# Patient Record
Sex: Male | Born: 1980 | Race: Black or African American | Hispanic: No | Marital: Single | Smoking: Never smoker
Health system: Southern US, Community
[De-identification: ages and names within clinical notes are randomized; demographics above are authoritative.]

## PROBLEM LIST (undated history)

## (undated) DIAGNOSIS — C6290 Malignant neoplasm of unspecified testis, unspecified whether descended or undescended: Secondary | ICD-10-CM

## (undated) HISTORY — PX: ORCHIECTOMY: SHX2116

## (undated) HISTORY — PX: TUMOR REMOVAL: SHX12

---

## 2019-04-04 ENCOUNTER — Encounter (HOSPITAL_COMMUNITY): Payer: Self-pay

## 2019-04-04 ENCOUNTER — Other Ambulatory Visit: Payer: Self-pay

## 2019-04-04 ENCOUNTER — Ambulatory Visit (HOSPITAL_COMMUNITY)
Admission: EM | Admit: 2019-04-04 | Discharge: 2019-04-04 | Disposition: A | Discharge status: Home / Self Care | Payer: PRIVATE HEALTH INSURANCE | Attending: Internal Medicine | Admitting: Internal Medicine

## 2019-04-04 DIAGNOSIS — S335XXA Sprain of ligaments of lumbar spine, initial encounter: Secondary | ICD-10-CM

## 2019-04-04 MED ORDER — KETOROLAC TROMETHAMINE 30 MG/ML IJ SOLN: 30.0000 mg | Freq: Once | INTRAMUSCULAR | Status: AC

## 2019-04-04 MED ORDER — KETOROLAC TROMETHAMINE 30 MG/ML IJ SOLN
INTRAMUSCULAR | Status: AC
  Filled 2019-04-04: qty 1

## 2019-04-04 MED ORDER — IBUPROFEN 600 MG PO TABS: 600.0000 mg | ORAL_TABLET | Freq: Four times a day (QID) | ORAL | 0 refills | Status: DC | PRN

## 2019-04-04 MED ORDER — CYCLOBENZAPRINE HCL 10 MG PO TABS: 10.0000 mg | ORAL_TABLET | Freq: Three times a day (TID) | ORAL | 0 refills | Status: AC | PRN

## 2019-04-04 MED ORDER — KETOROLAC TROMETHAMINE 30 MG/ML IJ SOLN
30.0000 mg | Freq: Once | INTRAMUSCULAR | Status: DC
  Administered 2019-04-04: 30 mg via INTRAVENOUS

## 2019-04-04 NOTE — ED Provider Notes (Signed)
[place]    CSN: [id number] Arrival date & time: 04/04/19  1045      History   Chief Complaint Chief Complaint  Patient presents with  . Back Pain    HPI Timothy [name] is a 39 y.o. male with no past medical history comes to urgent care with complaints of severe back pain of a few days duration.  Patient has been sedentary of late and works about 11 to 12 hours a day.  Patient remembers carrying a heavy bag prior to traveling down here from [place].  He drove several hours to get here.  Patient describes back pain as sharp, severe, aggravated by movement and has had partial relief with taking over-the-counter pain medications.  Patient denies any radiation of pain into his legs.  He denies any association with weakness in the legs, numbness or tingling.  Patient denies any fall or trauma to the back.   HPI  History reviewed. No pertinent past medical history.  There are no problems to display for this patient.   Past Surgical History:  Procedure Laterality Date  . TUMOR REMOVAL         Home Medications    Prior to Admission medications   Medication Sig Start Date End Date Taking? Authorizing Provider  cyclobenzaprine (FLEXERIL) 10 MG tablet Take 1 tablet (10 mg total) by mouth 3 (three) times daily as needed for muscle spasms. 04/04/19   Brennin Durfee, Myrene Galas, MD  ibuprofen (ADVIL) 600 MG tablet Take 1 tablet (600 mg total) by mouth every 6 (six) hours as needed. 04/04/19   Lexxi Koslow, Myrene Galas, MD    Family History Family History  Family history unknown: Yes    Social History Social History   Tobacco Use  . Smoking status: Never Smoker  Substance Use Topics  . Alcohol use: Not on file  . Drug use: Not on file     Allergies   Patient has no known allergies.   Review of Systems Review of Systems  Constitutional: Negative for activity change, chills, fatigue and fever.  Respiratory: Negative for cough, chest tightness, shortness of breath and  wheezing.   Cardiovascular: Negative for chest pain and palpitations.  Gastrointestinal: Negative for diarrhea, nausea and vomiting.  Musculoskeletal: Positive for back pain. Negative for myalgias and neck pain.  Skin: Negative.   Neurological: Negative for dizziness, weakness, numbness and headaches.  Psychiatric/Behavioral: Negative for agitation and confusion.     Physical Exam Triage Vital Signs ED Triage Vitals [04/04/19 1119]  Enc Vitals Group     BP 136/89     Pulse Rate 98     Resp 20     Temp 98 F (36.7 C)     Temp Source Oral     SpO2 96 %     Weight      Height      Head Circumference      Peak Flow      Pain Score 10     Pain Loc      Pain Edu?      Excl. in [place]?    No data found.  Updated Vital Signs BP 136/89 (BP Location: Left Arm)   Pulse 98   Temp 98 F (36.7 C) (Oral)   Resp 20   SpO2 96%   Visual Acuity Right Eye Distance:   Left Eye Distance:   Bilateral Distance:    Right Eye Near:   Left Eye Near:    Bilateral Near:  Physical Exam Constitutional:      General: He is in acute distress.     Appearance: He is not ill-appearing.  Cardiovascular:     Rate and Rhythm: Normal rate and regular rhythm.     Pulses: Normal pulses.     Heart sounds: Normal heart sounds.  Pulmonary:     Effort: Pulmonary effort is normal.     Breath sounds: Normal breath sounds.  Abdominal:     General: Bowel sounds are normal. There is no distension.     Palpations: Abdomen is soft.     Tenderness: There is no abdominal tenderness.  Musculoskeletal:        General: Normal range of motion.  Skin:    General: Skin is warm.     Capillary Refill: Capillary refill takes less than 2 seconds.  Neurological:     General: No focal deficit present.     Mental Status: He is alert and oriented to person, place, and time.     Cranial Nerves: No cranial nerve deficit.     Sensory: No sensory deficit.     Deep Tendon Reflexes: Reflexes normal.      UC  Treatments / Results  Labs (all labs ordered are listed, but only abnormal results are displayed) Labs Reviewed - No data to display  EKG   Radiology No results found.  Procedures Procedures (including critical care time)  Medications Ordered in UC Medications  ketorolac (TORADOL) 30 MG/ML injection 30 mg (0 mg Intramuscular Duplicate [id number])    Initial Impression / Assessment and Plan / UC Course  I have reviewed the triage vital signs and the nursing notes.  Pertinent labs & imaging results that were available during my care of the patient were reviewed by me and considered in my medical decision making (see chart for details).     1.  Lumbar sprain: Flexeril 10 mg every 8 hours as needed for muscle spasm Ibuprofen 600 mg every 6 hours as needed for back pain Heat therapy If patient has worsening symptoms he is advised to return to urgent care to be reevaluated Gentle range of motion exercises. Final Clinical Impressions(s) / UC Diagnoses   Final diagnoses:  Lumbar sprain, initial encounter   Discharge Instructions   None    ED Prescriptions    Medication Sig Dispense Auth. Provider   cyclobenzaprine (FLEXERIL) 10 MG tablet Take 1 tablet (10 mg total) by mouth 3 (three) times daily as needed for muscle spasms. 30 tablet Austyn Seier, Myrene Galas, MD   ibuprofen (ADVIL) 600 MG tablet Take 1 tablet (600 mg total) by mouth every 6 (six) hours as needed. 30 tablet Hester Forget, Myrene Galas, MD     PDMP not reviewed this encounter.   Chase Picket, MD 04/04/19 [id number]

## 2019-04-04 NOTE — ED Triage Notes (Signed)
Pt presents with severe lower back pain not related to any injury or lifting.

## 2019-04-10 ENCOUNTER — Inpatient Hospital Stay (HOSPITAL_COMMUNITY)
Admission: EM | Admit: 2019-04-10 | Discharge: 2019-04-29 | DRG: 271 | Disposition: A | Discharge status: Home / Self Care | Payer: BLUE CROSS/BLUE SHIELD | Attending: Family Medicine | Admitting: Family Medicine

## 2019-04-10 ENCOUNTER — Encounter (HOSPITAL_COMMUNITY): Payer: Self-pay

## 2019-04-10 ENCOUNTER — Other Ambulatory Visit: Payer: Self-pay

## 2019-04-10 DIAGNOSIS — Z298 Encounter for other specified prophylactic measures: Secondary | ICD-10-CM

## 2019-04-10 DIAGNOSIS — Z20822 Contact with and (suspected) exposure to covid-19: Secondary | ICD-10-CM | POA: Diagnosis present

## 2019-04-10 DIAGNOSIS — R140 Abdominal distension (gaseous): Secondary | ICD-10-CM

## 2019-04-10 DIAGNOSIS — R590 Localized enlarged lymph nodes: Secondary | ICD-10-CM

## 2019-04-10 DIAGNOSIS — Y92239 Unspecified place in hospital as the place of occurrence of the external cause: Secondary | ICD-10-CM | POA: Diagnosis not present

## 2019-04-10 DIAGNOSIS — C772 Secondary and unspecified malignant neoplasm of intra-abdominal lymph nodes: Secondary | ICD-10-CM | POA: Diagnosis present

## 2019-04-10 DIAGNOSIS — J9811 Atelectasis: Secondary | ICD-10-CM | POA: Diagnosis present

## 2019-04-10 DIAGNOSIS — R509 Fever, unspecified: Secondary | ICD-10-CM

## 2019-04-10 DIAGNOSIS — N1339 Other hydronephrosis: Secondary | ICD-10-CM | POA: Diagnosis present

## 2019-04-10 DIAGNOSIS — K567 Ileus, unspecified: Secondary | ICD-10-CM

## 2019-04-10 DIAGNOSIS — Y848 Other medical procedures as the cause of abnormal reaction of the patient, or of later complication, without mention of misadventure at the time of the procedure: Secondary | ICD-10-CM | POA: Diagnosis not present

## 2019-04-10 DIAGNOSIS — I82422 Acute embolism and thrombosis of left iliac vein: Principal | ICD-10-CM | POA: Diagnosis present

## 2019-04-10 DIAGNOSIS — R7989 Other specified abnormal findings of blood chemistry: Secondary | ICD-10-CM

## 2019-04-10 DIAGNOSIS — D849 Immunodeficiency, unspecified: Secondary | ICD-10-CM | POA: Diagnosis present

## 2019-04-10 DIAGNOSIS — Z8547 Personal history of malignant neoplasm of testis: Secondary | ICD-10-CM

## 2019-04-10 DIAGNOSIS — Z2989 Encounter for other specified prophylactic measures: Secondary | ICD-10-CM

## 2019-04-10 DIAGNOSIS — T8149XA Infection following a procedure, other surgical site, initial encounter: Secondary | ICD-10-CM

## 2019-04-10 DIAGNOSIS — I82409 Acute embolism and thrombosis of unspecified deep veins of unspecified lower extremity: Secondary | ICD-10-CM | POA: Diagnosis present

## 2019-04-10 DIAGNOSIS — I82402 Acute embolism and thrombosis of unspecified deep veins of left lower extremity: Secondary | ICD-10-CM

## 2019-04-10 DIAGNOSIS — R339 Retention of urine, unspecified: Secondary | ICD-10-CM | POA: Diagnosis not present

## 2019-04-10 DIAGNOSIS — Z9079 Acquired absence of other genital organ(s): Secondary | ICD-10-CM

## 2019-04-10 DIAGNOSIS — E876 Hypokalemia: Secondary | ICD-10-CM

## 2019-04-10 DIAGNOSIS — C6290 Malignant neoplasm of unspecified testis, unspecified whether descended or undescended: Secondary | ICD-10-CM

## 2019-04-10 DIAGNOSIS — R591 Generalized enlarged lymph nodes: Secondary | ICD-10-CM

## 2019-04-10 DIAGNOSIS — I8290 Acute embolism and thrombosis of unspecified vein: Secondary | ICD-10-CM

## 2019-04-10 DIAGNOSIS — R000 Tachycardia, unspecified: Secondary | ICD-10-CM | POA: Diagnosis not present

## 2019-04-10 DIAGNOSIS — E538 Deficiency of other specified B group vitamins: Secondary | ICD-10-CM | POA: Diagnosis present

## 2019-04-10 DIAGNOSIS — N179 Acute kidney failure, unspecified: Secondary | ICD-10-CM | POA: Diagnosis not present

## 2019-04-10 DIAGNOSIS — M96841 Postprocedural hematoma of a musculoskeletal structure following other procedure: Secondary | ICD-10-CM | POA: Diagnosis not present

## 2019-04-10 DIAGNOSIS — D509 Iron deficiency anemia, unspecified: Secondary | ICD-10-CM | POA: Diagnosis present

## 2019-04-10 HISTORY — DX: Malignant neoplasm of unspecified testis, unspecified whether descended or undescended: C62.90

## 2019-04-10 LAB — CBC
HCT: 42.9 % (ref 39.0–52.0)
Hemoglobin: 13.3 g/dL (ref 13.0–17.0)
MCH: 29.3 pg (ref 26.0–34.0)
MCHC: 31 g/dL (ref 30.0–36.0)
MCV: 94.5 fL (ref 80.0–100.0)
Platelets: 183 10*3/uL (ref 150–400)
RBC: 4.54 MIL/uL (ref 4.22–5.81)
RDW: 13.7 % (ref 11.5–15.5)
WBC: 6.1 10*3/uL (ref 4.0–10.5)
nRBC: 0 % (ref 0.0–0.2)

## 2019-04-10 NOTE — ED Provider Notes (Signed)
[name] DEPT Provider Note   CSN: [id number] Arrival date & time: 04/10/19  1958     History Chief Complaint  Patient presents with  . Leg Swelling    Timothy [name] is a 39 y.o. male.   39 y/o male presents to the ED for evaluation of LLE swelling and pain. Patient reports onset of L low back 8 days ago with some separate, isolated discomfort in his left leg. He originally thought that his symptoms were secondary to moving lots of boxes in preparation to visit his mother who lives in the area.  His back pain has since completely resolved, but he has been experiencing worsening pain in the left leg with associated swelling, edema.  Reports it to be difficult to ambulate secondary to his discomfort and swelling.  Was given ibuprofen and Flexeril which she has been using without relief.  Denies any known trauma or injury to the left lower extremity.  Denies associated syncope, SOB, chest pain, hemoptysis, recent surgeries or hospitalizations, PMH or FHx of DVT/PE.       History reviewed. No pertinent past medical history.  There are no problems to display for this patient.   Past Surgical History:  Procedure Laterality Date  . TUMOR REMOVAL         Family History  Family history unknown: Yes    Social History   Tobacco Use  . Smoking status: Never Smoker  Substance Use Topics  . Alcohol use: Not on file  . Drug use: Not on file    Home Medications Prior to Admission medications   Medication Sig Start Date End Date Taking? Authorizing Provider  cyclobenzaprine (FLEXERIL) 10 MG tablet Take 1 tablet (10 mg total) by mouth 3 (three) times daily as needed for muscle spasms. 04/04/19  Yes Lamptey, Myrene Galas, MD  ibuprofen (ADVIL) 600 MG tablet Take 1 tablet (600 mg total) by mouth every 6 (six) hours as needed. 04/04/19  Yes Lamptey, Myrene Galas, MD    Allergies    Patient has no known allergies.  Review of Systems   Review of Systems    Ten systems reviewed and are negative for acute change, except as noted in the HPI.    Physical Exam Updated Vital Signs BP 118/83   Pulse 85   Temp 99.5 F (37.5 C) (Oral)   Resp (!) 23   Ht 6\' 3"  (1.905 m)   Wt 90.7 kg   SpO2 100%   BMI 25.00 kg/m   Physical Exam Vitals and nursing note reviewed.  Constitutional:      General: He is not in acute distress.    Appearance: He is well-developed. He is not diaphoretic.     Comments: Nontoxic appearing and in NAD  HENT:     Head: Normocephalic and atraumatic.  Eyes:     General: No scleral icterus.    Conjunctiva/sclera: Conjunctivae normal.  Cardiovascular:     Rate and Rhythm: Regular rhythm. Tachycardia present.     Pulses: Normal pulses.     Comments: DP pulse intact. Pulmonary:     Effort: Pulmonary effort is normal. No respiratory distress.     Comments: Respirations even and unlabored Musculoskeletal:        General: Normal range of motion.     Cervical back: Normal range of motion.     Left lower leg: Edema present.     Comments: 1+ pitting edema with very mild erythema noted to the LLE. No heat to  touch. No TTP to the L knee joint. No bony deformity or crepitus of the L knee.  Skin:    General: Skin is warm and dry.     Coloration: Skin is not pale.     Findings: No erythema or rash.  Neurological:     Mental Status: He is alert and oriented to person, place, and time.  Psychiatric:        Behavior: Behavior normal.     ED Results / Procedures / Treatments   Labs (all labs ordered are listed, but only abnormal results are displayed) Labs Reviewed  SARS CORONAVIRUS 2 (TAT 6-24 HRS)  CBC  BASIC METABOLIC PANEL  ANTITHROMBIN III  PROTEIN C ACTIVITY  PROTEIN C, TOTAL  PROTEIN S ACTIVITY  PROTEIN S, TOTAL  LUPUS ANTICOAGULANT PANEL  BETA-2-GLYCOPROTEIN I ABS, IGG/M/A  HOMOCYSTEINE  FACTOR 5 LEIDEN  PROTHROMBIN GENE MUTATION  CARDIOLIPIN ANTIBODIES, IGG, IGM, IGA  TROPONIN I (HIGH SENSITIVITY)     EKG EKG Interpretation  Date/Time:  Monday April 10 2019 23:14:47 EST Ventricular Rate:  95 PR Interval:    QRS Duration: 87 QT Interval:  336 QTC Calculation: 423 R Axis:   44 Text Interpretation: Sinus rhythm Confirmed by Madalyn Rob [phone]) on 04/11/2019 12:46:49 AM   Radiology CT Angio Chest PE W and/or Wo Contrast  Result Date: 04/11/2019 CLINICAL DATA:  Positive lower extremity DVT, PE suspected. Back pain and leg swelling, history of testicular cancer. EXAM: CT ANGIOGRAPHY CHEST CT ABDOMEN AND PELVIS WITH CONTRAST TECHNIQUE: Multidetector CT imaging of the chest was performed using the standard protocol during bolus administration of intravenous contrast. Multiplanar CT image reconstructions and MIPs were obtained to evaluate the vascular anatomy. Multidetector CT imaging of the abdomen and pelvis was performed using the standard protocol during bolus administration of intravenous contrast. CONTRAST:  147mL OMNIPAQUE IOHEXOL 350 MG/ML SOLN COMPARISON:  None. FINDINGS: CTA CHEST FINDINGS Cardiovascular: Suboptimal opacification of the pulmonary arteries limits evaluation beyond the lobar level. No large central or lobar pulmonary arterial filling defects are identified. Central pulmonary arteries are normal caliber. Normal cardiac size. 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 The aorta is normal caliber. Shared origin of the brachiocephalic and left common carotid arteries. Great vessels are otherwise unremarkable. Mediastinum/Nodes: No enlarged mediastinal, hilar or axillary adenopathy is seen. No acute abnormality of the trachea or esophagus. Thyroid gland is normal. Asymmetric thickening of the left sternocleidomastoid is likely related to muscle activation. Lungs/Pleura: Respiratory motion artifact is mild. No consolidation, features of edema, pneumothorax, or effusion. No suspicious pulmonary nodules or  masses. Musculoskeletal: No acute osseous abnormality or suspicious osseous lesion in the imaged chest. No worrisome chest wall lesions. Mild bilateral gynecomastia is noted, right slightly greater left. Review of the MIP images confirms the above findings. CT ABDOMEN and PELVIS FINDINGS Hepatobiliary: No focal liver abnormality is seen. No gallstones, gallbladder wall thickening, or biliary dilatation. Pancreas: Unremarkable. No pancreatic ductal dilatation or surrounding inflammatory changes. Spleen: Normal in size without focal abnormality. Adrenals/Urinary Tract: Adrenal glands are unremarkable. Kidneys are normal, without renal calculi, focal lesion, or hydronephrosis. Bladder is unremarkable. Stomach/Bowel: Distal esophagus, stomach and duodenal sweep are unremarkable. No small bowel wall thickening or dilatation. No evidence of obstruction. A normal appendix is visualized. No colonic dilatation or wall thickening. Scattered colonic diverticula without focal pericolonic inflammation to suggest diverticulitis. Vascular/Lymphatic: There is extensive retroperitoneal and pelvic adenopathy, some of  the larger nodes include a left periaortic node measuring up to 3.2 cm (2/43) and a 2 cm aortocaval lymph node (2/35) there is distal abdominal aortic encasement by a extensive retroperitoneal adenopathy. Furthermore there is likely direct compression and/or direct nodal involvement of the inferior vena cava (2/40 with the lower IVC appearing expanded by hypoattenuating filling defects. Edematous changes are noted throughout the pelvis with venous engorgement and collateralization. Reproductive: The prostate and seminal vesicles are unremarkable. There is a varicocele seen in the left scrotum. External genitalia are incompletely included on this exam. Other: There is circumferential body wall edema from the level of the pelvis inferiorly more pronounced throughout the left lower extremity. Free fluid is seen in the deep  pelvis, likely reactive or secondary to venous occlusion with developing collateralization. Musculoskeletal: No acute osseous abnormality or suspicious osseous lesion. Normal symmetric muscular enhancement. Review of the MIP images confirms the above findings. IMPRESSION: 1. Suboptimal opacification of the pulmonary arteries limits evaluation beyond the lobar level. No large central or lobar pulmonary arterial filling defects are identified. 2. Extensive retroperitoneal and pelvic adenopathy. There is likely direct compression of the lower IVC with the distal IVC and iliac veins appearing expanded by hypoattenuating thrombus. There is complete encasement of the abdominal aorta as well but without luminal narrowing. Findings are highly concerning for metastatic disease, particularly given patient history of testicular cancer. 3. Left scrotal varicocele. External genitalia are incompletely included on this exam. Consider further evaluation scrotal ultrasound given extensive retroperitoneal adenopathy and patient's history of testicular cancer. 4. Free fluid in the deep pelvis is likely reactive or secondary to venous occlusion with developing collateralization. 5. Colonic diverticulosis without evidence of diverticulitis. 6. Mild bilateral gynecomastia, right slightly greater left. 7. 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 Critical Value/emergent results were called by telephone at the time of interpretation on 04/11/2019 at 1:33 am to [place] , who verbally acknowledged these results. Electronically Signed   By: Lovena Le M.D.   On: 04/11/2019 01:33   CT ABDOMEN PELVIS W CONTRAST  Result Date: 04/11/2019 CLINICAL DATA:  Positive lower extremity DVT, PE suspected. Back pain and leg swelling, history of testicular cancer. EXAM: CT ANGIOGRAPHY CHEST CT ABDOMEN AND PELVIS WITH CONTRAST TECHNIQUE: Multidetector CT imaging of the chest  was performed using the standard protocol during bolus administration of intravenous contrast. Multiplanar CT image reconstructions and MIPs were obtained to evaluate the vascular anatomy. Multidetector CT imaging of the abdomen and pelvis was performed using the standard protocol during bolus administration of intravenous contrast. CONTRAST:  140mL OMNIPAQUE IOHEXOL 350 MG/ML SOLN COMPARISON:  None. FINDINGS: CTA CHEST FINDINGS Cardiovascular: Suboptimal opacification of the pulmonary arteries limits evaluation beyond the lobar level. No large central or lobar pulmonary arterial filling defects are identified. Central pulmonary arteries are normal caliber. Normal cardiac size. 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 The aorta is normal caliber. Shared origin of the brachiocephalic and left common carotid arteries. Great vessels are otherwise unremarkable. Mediastinum/Nodes: No enlarged mediastinal, hilar or axillary adenopathy is seen. No acute abnormality of the trachea or esophagus. Thyroid gland is normal. Asymmetric thickening of the left sternocleidomastoid is likely related to muscle activation. Lungs/Pleura: Respiratory motion artifact is mild. No consolidation, features of edema, pneumothorax, or effusion. No suspicious pulmonary nodules or masses. Musculoskeletal: No acute  osseous abnormality or suspicious osseous lesion in the imaged chest. No worrisome chest wall lesions. Mild bilateral gynecomastia is noted, right slightly greater left. Review of the MIP images confirms the above findings. CT ABDOMEN and PELVIS FINDINGS Hepatobiliary: No focal liver abnormality is seen. No gallstones, gallbladder wall thickening, or biliary dilatation. Pancreas: Unremarkable. No pancreatic ductal dilatation or surrounding inflammatory changes. Spleen: Normal in size without focal abnormality. Adrenals/Urinary Tract: Adrenal glands are unremarkable.  Kidneys are normal, without renal calculi, focal lesion, or hydronephrosis. Bladder is unremarkable. Stomach/Bowel: Distal esophagus, stomach and duodenal sweep are unremarkable. No small bowel wall thickening or dilatation. No evidence of obstruction. A normal appendix is visualized. No colonic dilatation or wall thickening. Scattered colonic diverticula without focal pericolonic inflammation to suggest diverticulitis. Vascular/Lymphatic: There is extensive retroperitoneal and pelvic adenopathy, some of the larger nodes include a left periaortic node measuring up to 3.2 cm (2/43) and a 2 cm aortocaval lymph node (2/35) there is distal abdominal aortic encasement by a extensive retroperitoneal adenopathy. Furthermore there is likely direct compression and/or direct nodal involvement of the inferior vena cava (2/40 with the lower IVC appearing expanded by hypoattenuating filling defects. Edematous changes are noted throughout the pelvis with venous engorgement and collateralization. Reproductive: The prostate and seminal vesicles are unremarkable. There is a varicocele seen in the left scrotum. External genitalia are incompletely included on this exam. Other: There is circumferential body wall edema from the level of the pelvis inferiorly more pronounced throughout the left lower extremity. Free fluid is seen in the deep pelvis, likely reactive or secondary to venous occlusion with developing collateralization. Musculoskeletal: No acute osseous abnormality or suspicious osseous lesion. Normal symmetric muscular enhancement. Review of the MIP images confirms the above findings. IMPRESSION: 1. Suboptimal opacification of the pulmonary arteries limits evaluation beyond the lobar level. No large central or lobar pulmonary arterial filling defects are identified. 2. Extensive retroperitoneal and pelvic adenopathy. There is likely direct compression of the lower IVC with the distal IVC and iliac veins appearing expanded by  hypoattenuating thrombus. There is complete encasement of the abdominal aorta as well but without luminal narrowing. Findings are highly concerning for metastatic disease, particularly given patient history of testicular cancer. 3. Left scrotal varicocele. External genitalia are incompletely included on this exam. Consider further evaluation scrotal ultrasound given extensive retroperitoneal adenopathy and patient's history of testicular cancer. 4. Free fluid in the deep pelvis is likely reactive or secondary to venous occlusion with developing collateralization. 5. Colonic diverticulosis without evidence of diverticulitis. 6. Mild bilateral gynecomastia, right slightly greater left. 7. 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 Critical Value/emergent results were called by telephone at the time of interpretation on 04/11/2019 at 1:33 am to [place] , who verbally acknowledged these results. Electronically Signed   By: Lovena Le M.D.   On: 04/11/2019 01:33    Procedures .Critical Care Performed by: Antonietta Breach, PA-C Authorized by: Antonietta Breach, PA-C   Critical care provider statement:    Critical care time (minutes):  45   Critical care was necessary to treat or prevent imminent or life-threatening deterioration of the following conditions: VTE.   Critical care was time spent personally by me on the following activities:  Discussions with consultants, evaluation of patient's response to treatment, examination of patient, ordering and performing treatments and interventions, ordering and review of laboratory studies, ordering and review of radiographic studies, pulse oximetry, re-evaluation  of patient's condition, obtaining history from patient or surrogate and review of old charts   (including critical care time)  Medications Ordered in ED Medications  sodium chloride (PF) 0.9 % injection (has no administration in time  range)  heparin bolus via infusion 2,500 Units (has no administration in time range)    Followed by  heparin ADULT infusion 100 units/mL (25000 units/244mL sodium chloride 0.45%) (has no administration in time range)  iohexol (OMNIPAQUE) 350 MG/ML injection 100 mL (100 mLs Intravenous Contrast Given 04/11/19 0059)  morphine 4 MG/ML injection 4 mg (4 mg Intravenous Given 04/11/19 0150)  ondansetron (ZOFRAN) injection 4 mg (4 mg Intravenous Given 04/11/19 0149)    ED Course  I have reviewed the triage vital signs and the nursing notes.  Pertinent labs & imaging results that were available during my care of the patient were reviewed by me and considered in my medical decision making (see chart for details).  Clinical Course as of Apr 10 210  Mon Apr 10, 2019  2326 Patient with LLE edema concerning for DVT. Noted to be tachycardic. EKG reviewed by me. Notable for S1Q3T3. Clinically, there is concern for PE despite lack of hypoxia. Patient denies chest pain, pleuritic discomfort. Unable to obtain LLE venous duplex. Discussed plan/options with patient. Decision was made to proceed with labs, CTA for PE r/o.   [KH]  Tue Apr 11, 2019  0037 [place] completed at bedside by MD [facility]. Appears to be a large DVT in the left femoral vein. CT abd/pelvis added to assess for more central clot burden in the IVC. Hypercoag panel also added given no significant risk factors for clotting. Patient to require admission.   [KH]    Clinical Course User Index [KH] Antonietta Breach, PA-C    MDM Rules/Calculators/A&P                      39 year old male, visiting from out of town, presenting to the emergency department for evaluation of left lower extremity edema which has been progressive over the past week.  Bedside ultrasound showed findings concerning for DVT of the left femoral vein.    Patient underwent CTA of the chest with extended venous phase into the abdomen/pelvis.  While he has no evidence of pulmonary  embolus on imaging, there is significant retroperitoneal lymphadenopathy which is compressing the IVC causing clot burden distally.    Based on pattern of lymphadenopathy and history of testicular cancer, there is existing concern for recurrence of this cancer with metastases to lymph nodes.  Genitals incompletely evaluated on imaging, therefore scrotal ultrasound was added.  The patient was started on IV heparin given presence of VTE.  Plan for admission for continued anticoagulation and further work up.   Final Clinical Impression(s) / ED Diagnoses Final diagnoses:  VTE (venous thromboembolism)  Lymphadenopathy, retroperitoneal    Rx / DC Orders ED Discharge Orders    None       Antonietta Breach, PA-C 04/11/19 [id number]    Lucrezia Starch, MD 04/11/19 2200

## 2019-04-10 NOTE — ED Triage Notes (Signed)
Patient arrived stating he moved from [place] and began having back pain and swelling to the left leg. Was seen at urgent care and given ibuprofen and flexeril. Reporting back pain has resolved but the swelling is getting worse. Edema noted to left leg. Worsens when ambulating, warm to touch.

## 2019-04-11 ENCOUNTER — Emergency Department (HOSPITAL_COMMUNITY): Payer: BLUE CROSS/BLUE SHIELD

## 2019-04-11 ENCOUNTER — Inpatient Hospital Stay (HOSPITAL_COMMUNITY): Payer: BLUE CROSS/BLUE SHIELD

## 2019-04-11 ENCOUNTER — Encounter (HOSPITAL_COMMUNITY): Payer: Self-pay

## 2019-04-11 DIAGNOSIS — M96841 Postprocedural hematoma of a musculoskeletal structure following other procedure: Secondary | ICD-10-CM | POA: Diagnosis not present

## 2019-04-11 DIAGNOSIS — K567 Ileus, unspecified: Secondary | ICD-10-CM | POA: Diagnosis not present

## 2019-04-11 DIAGNOSIS — R509 Fever, unspecified: Secondary | ICD-10-CM | POA: Diagnosis not present

## 2019-04-11 DIAGNOSIS — N179 Acute kidney failure, unspecified: Secondary | ICD-10-CM | POA: Diagnosis not present

## 2019-04-11 DIAGNOSIS — E876 Hypokalemia: Secondary | ICD-10-CM | POA: Diagnosis not present

## 2019-04-11 DIAGNOSIS — R140 Abdominal distension (gaseous): Secondary | ICD-10-CM | POA: Diagnosis not present

## 2019-04-11 DIAGNOSIS — I82422 Acute embolism and thrombosis of left iliac vein: Secondary | ICD-10-CM | POA: Diagnosis present

## 2019-04-11 DIAGNOSIS — N1339 Other hydronephrosis: Secondary | ICD-10-CM | POA: Diagnosis present

## 2019-04-11 DIAGNOSIS — Z8547 Personal history of malignant neoplasm of testis: Secondary | ICD-10-CM | POA: Diagnosis not present

## 2019-04-11 DIAGNOSIS — E538 Deficiency of other specified B group vitamins: Secondary | ICD-10-CM | POA: Diagnosis present

## 2019-04-11 DIAGNOSIS — I82402 Acute embolism and thrombosis of unspecified deep veins of left lower extremity: Secondary | ICD-10-CM | POA: Diagnosis not present

## 2019-04-11 DIAGNOSIS — Y848 Other medical procedures as the cause of abnormal reaction of the patient, or of later complication, without mention of misadventure at the time of the procedure: Secondary | ICD-10-CM | POA: Diagnosis not present

## 2019-04-11 DIAGNOSIS — R590 Localized enlarged lymph nodes: Secondary | ICD-10-CM

## 2019-04-11 DIAGNOSIS — I82409 Acute embolism and thrombosis of unspecified deep veins of unspecified lower extremity: Secondary | ICD-10-CM | POA: Diagnosis present

## 2019-04-11 DIAGNOSIS — R000 Tachycardia, unspecified: Secondary | ICD-10-CM | POA: Diagnosis not present

## 2019-04-11 DIAGNOSIS — I824Y2 Acute embolism and thrombosis of unspecified deep veins of left proximal lower extremity: Secondary | ICD-10-CM

## 2019-04-11 DIAGNOSIS — C772 Secondary and unspecified malignant neoplasm of intra-abdominal lymph nodes: Secondary | ICD-10-CM | POA: Diagnosis present

## 2019-04-11 DIAGNOSIS — M7989 Other specified soft tissue disorders: Secondary | ICD-10-CM

## 2019-04-11 DIAGNOSIS — Z9079 Acquired absence of other genital organ(s): Secondary | ICD-10-CM | POA: Diagnosis not present

## 2019-04-11 DIAGNOSIS — A689 Relapsing fever, unspecified: Secondary | ICD-10-CM | POA: Diagnosis not present

## 2019-04-11 DIAGNOSIS — I82462 Acute embolism and thrombosis of left calf muscular vein: Secondary | ICD-10-CM | POA: Diagnosis not present

## 2019-04-11 DIAGNOSIS — J9811 Atelectasis: Secondary | ICD-10-CM | POA: Diagnosis present

## 2019-04-11 DIAGNOSIS — D509 Iron deficiency anemia, unspecified: Secondary | ICD-10-CM | POA: Diagnosis present

## 2019-04-11 DIAGNOSIS — R339 Retention of urine, unspecified: Secondary | ICD-10-CM | POA: Diagnosis not present

## 2019-04-11 DIAGNOSIS — Y92239 Unspecified place in hospital as the place of occurrence of the external cause: Secondary | ICD-10-CM | POA: Diagnosis not present

## 2019-04-11 DIAGNOSIS — D849 Immunodeficiency, unspecified: Secondary | ICD-10-CM | POA: Diagnosis present

## 2019-04-11 DIAGNOSIS — T8149XA Infection following a procedure, other surgical site, initial encounter: Secondary | ICD-10-CM | POA: Diagnosis not present

## 2019-04-11 DIAGNOSIS — Z20822 Contact with and (suspected) exposure to covid-19: Secondary | ICD-10-CM | POA: Diagnosis present

## 2019-04-11 DIAGNOSIS — R7989 Other specified abnormal findings of blood chemistry: Secondary | ICD-10-CM | POA: Diagnosis not present

## 2019-04-11 DIAGNOSIS — C6290 Malignant neoplasm of unspecified testis, unspecified whether descended or undescended: Secondary | ICD-10-CM | POA: Diagnosis not present

## 2019-04-11 LAB — BASIC METABOLIC PANEL
Anion gap: 8 (ref 5–15)
BUN: 12 mg/dL (ref 6–20)
CO2: 26 mmol/L (ref 22–32)
Calcium: 9 mg/dL (ref 8.9–10.3)
Chloride: 106 mmol/L (ref 98–111)
Creatinine, Ser: 0.99 mg/dL (ref 0.61–1.24)
GFR calc Af Amer: >60 mL/min (ref >60)
GFR calc non Af Amer: >60 mL/min (ref >60)
Glucose, Bld: 97 mg/dL (ref 70–99)
Potassium: 4.3 mmol/L (ref 3.5–5.1)
Sodium: 140 mmol/L (ref 135–145)

## 2019-04-11 LAB — COMPREHENSIVE METABOLIC PANEL
ALT: 16 U/L (ref 0–44)
AST: 17 U/L (ref 15–41)
Albumin: 3.9 g/dL (ref 3.5–5.0)
Alkaline Phosphatase: 59 U/L (ref 38–126)
Anion gap: 7 (ref 5–15)
BUN: 10 mg/dL (ref 6–20)
CO2: 26 mmol/L (ref 22–32)
Calcium: 8.8 mg/dL — ABNORMAL LOW (ref 8.9–10.3)
Chloride: 105 mmol/L (ref 98–111)
Creatinine, Ser: 0.86 mg/dL (ref 0.61–1.24)
GFR calc Af Amer: >60 mL/min (ref >60)
GFR calc non Af Amer: >60 mL/min (ref >60)
Glucose, Bld: 97 mg/dL (ref 70–99)
Potassium: 4.1 mmol/L (ref 3.5–5.1)
Sodium: 138 mmol/L (ref 135–145)
Total Bilirubin: 0.8 mg/dL (ref 0.3–1.2)
Total Protein: 7.8 g/dL (ref 6.5–8.1)

## 2019-04-11 LAB — SARS CORONAVIRUS 2 (TAT 6-24 HRS): SARS Coronavirus 2: NEGATIVE

## 2019-04-11 LAB — CBC
HCT: 42.6 % (ref 39.0–52.0)
Hemoglobin: 13.5 g/dL (ref 13.0–17.0)
MCH: 29.9 pg (ref 26.0–34.0)
MCHC: 31.7 g/dL (ref 30.0–36.0)
MCV: 94.2 fL (ref 80.0–100.0)
Platelets: 177 10*3/uL (ref 150–400)
RBC: 4.52 MIL/uL (ref 4.22–5.81)
RDW: 13.9 % (ref 11.5–15.5)
WBC: 6.1 10*3/uL (ref 4.0–10.5)
nRBC: 0 % (ref 0.0–0.2)

## 2019-04-11 LAB — HEPARIN LEVEL (UNFRACTIONATED): Heparin Unfractionated: 0.59 IU/mL (ref 0.30–0.70)

## 2019-04-11 LAB — PROTIME-INR
INR: 1 (ref 0.8–1.2)
Prothrombin Time: 13.3 seconds (ref 11.4–15.2)

## 2019-04-11 LAB — ANTITHROMBIN III: AntiThromb III Func: 92 % (ref 75–120)

## 2019-04-11 LAB — LACTATE DEHYDROGENASE: LDH: 192 U/L (ref 98–192)

## 2019-04-11 LAB — HIV ANTIBODY (ROUTINE TESTING W REFLEX): HIV Screen 4th Generation wRfx: NONREACTIVE

## 2019-04-11 LAB — TROPONIN I (HIGH SENSITIVITY): Troponin I (High Sensitivity): 2 ng/L (ref <18)

## 2019-04-11 MED ORDER — ACETAMINOPHEN 325 MG PO TABS
650.0000 mg | ORAL_TABLET | Freq: Four times a day (QID) | ORAL | Status: DC | PRN
  Administered 2019-04-11 – 2019-04-23 (×12): 650 mg via ORAL
  Filled 2019-04-11 (×15): qty 2

## 2019-04-11 MED ORDER — IOHEXOL 350 MG/ML SOLN
100.0000 mL | Freq: Once | INTRAVENOUS | Status: AC | PRN
  Administered 2019-04-11: 100 mL via INTRAVENOUS

## 2019-04-11 MED ORDER — SODIUM CHLORIDE 0.9% FLUSH
3.0000 mL | Freq: Two times a day (BID) | INTRAVENOUS | Status: DC
  Administered 2019-04-12 – 2019-04-15 (×2): 3 mL via INTRAVENOUS

## 2019-04-11 MED ORDER — MORPHINE SULFATE (PF) 4 MG/ML IV SOLN
4.0000 mg | Freq: Once | INTRAVENOUS | Status: AC
  Administered 2019-04-11: 4 mg via INTRAVENOUS
  Filled 2019-04-11: qty 1

## 2019-04-11 MED ORDER — HEPARIN BOLUS VIA INFUSION
2500.0000 [IU] | Freq: Once | INTRAVENOUS | Status: AC
  Administered 2019-04-11: 2500 [IU] via INTRAVENOUS
  Filled 2019-04-11: qty 2500

## 2019-04-11 MED ORDER — ACETAMINOPHEN 650 MG RE SUPP
650.0000 mg | Freq: Four times a day (QID) | RECTAL | Status: DC | PRN
  Administered 2019-04-18 – 2019-04-19 (×4): 650 mg via RECTAL
  Filled 2019-04-11 (×4): qty 1

## 2019-04-11 MED ORDER — HEPARIN (PORCINE) 25000 UT/250ML-% IV SOLN
2600.0000 [IU]/h | INTRAVENOUS | Status: AC
  Administered 2019-04-11 – 2019-04-12 (×3): 1700 [IU]/h via INTRAVENOUS
  Administered 2019-04-13: 1800 [IU]/h via INTRAVENOUS
  Administered 2019-04-14 – 2019-04-15 (×3): 1950 [IU]/h via INTRAVENOUS
  Administered 2019-04-16 – 2019-04-18 (×5): 2200 [IU]/h via INTRAVENOUS
  Administered 2019-04-18 – 2019-04-24 (×14): 2600 [IU]/h via INTRAVENOUS
  Filled 2019-04-11 (×31): qty 250

## 2019-04-11 MED ORDER — ONDANSETRON HCL 4 MG/2ML IJ SOLN
4.0000 mg | Freq: Once | INTRAMUSCULAR | Status: AC
  Administered 2019-04-11: 4 mg via INTRAVENOUS
  Filled 2019-04-11: qty 2

## 2019-04-11 MED ORDER — OXYCODONE HCL 5 MG PO TABS
5.0000 mg | ORAL_TABLET | ORAL | Status: DC | PRN
  Administered 2019-04-12 – 2019-04-29 (×9): 5 mg via ORAL
  Filled 2019-04-11 (×11): qty 1

## 2019-04-11 MED ORDER — SODIUM CHLORIDE (PF) 0.9 % IJ SOLN
INTRAMUSCULAR | Status: AC
  Filled 2019-04-11: qty 50

## 2019-04-11 MED ORDER — SODIUM CHLORIDE 0.9% FLUSH: 3.0000 mL | INTRAVENOUS | Status: DC | PRN

## 2019-04-11 MED ORDER — SODIUM CHLORIDE 0.9 % IV SOLN: 250.0000 mL | INTRAVENOUS | Status: DC | PRN

## 2019-04-11 NOTE — Progress Notes (Signed)
Measurement left thigh circumference: 61.5 cm. Attending physician aware. New order given to measure thigh circumference every 6 hours.

## 2019-04-11 NOTE — Progress Notes (Signed)
ANTICOAGULATION CONSULT NOTE - Initial Consult  Pharmacy Consult for Heparin Indication: DVT  No Known Allergies  Patient Measurements: Height: 6\' 3"  (190.5 cm) Weight: 200 lb (90.7 kg) IBW/kg (Calculated) : 84.5  Vital Signs: Temp: 99.5 F (37.5 C) (01/25 2005) Temp Source: Oral (01/25 2005) BP: 118/83 (01/26 0130) Pulse Rate: 85 (01/26 0130)  Labs: Recent Labs    04/10/19 2320  HGB 13.3  HCT 42.9  PLT 183  CREATININE 0.99  TROPONINIHS 2    Estimated Creatinine Clearance: 123.3 mL/min (by C-G formula based on SCr of 0.99 mg/dL).   Medical History: History reviewed. No pertinent past medical history.  Medications:  Infusions:  No anticoagulation PTA  Assessment: 39 yo M with hx testicular CA and recent travel from [place] presents with left leg pain & swelling.  Ultrasound + L DVT in femoral vein. CBC WNL.  No bleeding noted.  Renal function at baseline, CrCl>11ml/min  Goal of Therapy:  Heparin level 0.3-0.7 units/ml Monitor platelets by anticoagulation protocol: Yes   Plan:  Give 2500 units bolus x 1 Start heparin infusion at 1700 units/hr Check anti-Xa level in 6 hours and daily while on heparin Continue to monitor H&H and platelets  Netta Cedars, PharmD, BCPS 04/11/2019,1:50 AM

## 2019-04-11 NOTE — Progress Notes (Signed)
Left thigh circumference - 60.5 cm. Pulse palpated and audible on doppler on left lower foot.

## 2019-04-11 NOTE — H&P (Signed)
History and Physical    Revis Botz [id number] DOB: 1980/07/11 DOA: 04/10/2019  PCP: Patient, No Pcp Per  Patient coming from: Home  Chief Complaint: Left lower extremity swelling and back pain  HPI: Timothy [name] is a 39 y.o. male with medical history significant of stage Ib seminoma testicular cancer around 2012 did not receive chemotherapy but is status post right radical orchiectomy in November 2012 at [facility].  Patient reports he got surveillance but he does not know for what period of time.  He currently lives in [place] and traveled via vehicle to [place] about a week ago to visit his mother.  Prior to him leaving [place] his left lower extremity was mildly swollen he was having lower back pain.  He got to [place] and actually was seen in urgent care with back pain several days ago for which she was given NSAIDs and Flexeril.  Patient presents tonight with markedly increased swelling in his left lower extremity now associated with much more pain.  He denies any urinary symptoms.  He denies any rashes.  He denies any recent trauma.  He denies any urinary symptoms such as dysuria or hematuria or change in urinary symptoms.  He also denies any testicular swelling in his left testicle or any swelling on the right side which is absent of his testes.  Patient reports he regularly does testicular exams.  He denies any bright red blood per rectum or any melena.  He has no family history of VTE or any other types of cancers.  Patient found to have an extensive left lower extremity DVT with significant lymphadenopathy in the retroperitoneal area concerning for metastatic disease of unknown primary.  Patient be referred for admission for his newly diagnosed DVT which is large above the knee.  Heparin has been started and his pain is much better after being treated in the emergency department.  Review of Systems: As per HPI otherwise 10 point review of systems negative.   History  reviewed. No pertinent past medical history.  Testicular cancer as above  Past Surgical History:  Procedure Laterality Date  . TUMOR REMOVAL       reports that he has never smoked. He does not have any smokeless tobacco history on file. No history on file for alcohol and drug.  No Known Allergies  Family History  Family history unknown: Yes  No premature coronary artery disease no VTE and no cancers  Prior to Admission medications   Medication Sig Start Date End Date Taking? Authorizing Provider  cyclobenzaprine (FLEXERIL) 10 MG tablet Take 1 tablet (10 mg total) by mouth 3 (three) times daily as needed for muscle spasms. 04/04/19  Yes Lamptey, Myrene Galas, MD  ibuprofen (ADVIL) 600 MG tablet Take 1 tablet (600 mg total) by mouth every 6 (six) hours as needed. 04/04/19  Yes Chase Picket, MD    Physical Exam: Vitals:   04/11/19 0030 04/11/19 0130 04/11/19 0230 04/11/19 0245  BP: (!) 126/92 118/83 120/81   Pulse: 94 85 89 86  Resp: 12 (!) 23 (!) 25 16  Temp:      TempSrc:      SpO2: 100% 100% 99% 98%  Weight:      Height:          Constitutional: NAD, calm, comfortable Vitals:   04/11/19 0030 04/11/19 0130 04/11/19 0230 04/11/19 0245  BP: (!) 126/92 118/83 120/81   Pulse: 94 85 89 86  Resp: 12 (!) 23 (!) 25  16  Temp:      TempSrc:      SpO2: 100% 100% 99% 98%  Weight:      Height:       Eyes: PERRL, lids and conjunctivae normal ENMT: Mucous membranes are moist. Posterior pharynx clear of any exudate or lesions.Normal dentition.  Neck: normal, supple, no masses, no thyromegaly Respiratory: clear to auscultation bilaterally, no wheezing, no crackles. Normal respiratory effort. No accessory muscle use.  Cardiovascular: Regular rate and rhythm, no murmurs / rubs / gallops. No extremity edema. 2+ pedal pulses. No carotid bruits.  Abdomen: no tenderness, no masses palpated. No hepatosplenomegaly. Bowel sounds positive.  Musculoskeletal: no clubbing / cyanosis. No joint  deformity upper and lower extremities except moderate amount of left lower extremity swelling up to the thigh. Good ROM, no contractures. Normal muscle tone.  Skin: no rashes, lesions, ulcers. No induration Neurologic: CN 2-12 grossly intact. Sensation intact, DTR normal. Strength 5/5 in all 4.  Psychiatric: Normal judgment and insight. Alert and oriented x 3. Normal mood.    Labs on Admission: I have personally reviewed following labs and imaging studies  CBC: Recent Labs  Lab 04/10/19 2320  WBC 6.1  HGB 13.3  HCT 42.9  MCV 94.5  PLT [id number]   Basic Metabolic Panel: Recent Labs  Lab 04/10/19 2320  NA 140  K 4.3  CL 106  CO2 26  GLUCOSE 97  BUN 12  CREATININE 0.99  CALCIUM 9.0   GFR: Estimated Creatinine Clearance: 123.3 mL/min (by C-G formula based on SCr of 0.99 mg/dL). Liver Function Tests: No results for input(s): AST, ALT, ALKPHOS, BILITOT, PROT, ALBUMIN in the last 168 hours. No results for input(s): LIPASE, AMYLASE in the last 168 hours. No results for input(s): AMMONIA in the last 168 hours. Coagulation Profile: No results for input(s): INR, PROTIME in the last 168 hours. Cardiac Enzymes: No results for input(s): CKTOTAL, CKMB, CKMBINDEX, TROPONINI in the last 168 hours. BNP (last 3 results) No results for input(s): PROBNP in the last 8760 hours. HbA1C: No results for input(s): HGBA1C in the last 72 hours. CBG: No results for input(s): GLUCAP in the last 168 hours. Lipid Profile: No results for input(s): CHOL, HDL, LDLCALC, TRIG, CHOLHDL, LDLDIRECT in the last 72 hours. Thyroid Function Tests: No results for input(s): TSH, T4TOTAL, FREET4, T3FREE, THYROIDAB in the last 72 hours. Anemia Panel: No results for input(s): VITAMINB12, FOLATE, FERRITIN, TIBC, IRON, RETICCTPCT in the last 72 hours. Urine analysis: No results found for: COLORURINE, APPEARANCEUR, LABSPEC, PHURINE, GLUCOSEU, HGBUR, BILIRUBINUR, KETONESUR, PROTEINUR, UROBILINOGEN, NITRITE,  LEUKOCYTESUR Sepsis Labs: !!!!!!!!!!!!!!!!!!!!!!!!!!!!!!!!!!!!!!!!!!!! @LABRCNTIP (procalcitonin:4,lacticidven:4) )No results found for this or any previous visit (from the past 240 hour(s)).   Radiological Exams on Admission: CT Angio Chest PE W and/or Wo Contrast  Result Date: 04/11/2019 CLINICAL DATA:  Positive lower extremity DVT, PE suspected. Back pain and leg swelling, history of testicular cancer. EXAM: CT ANGIOGRAPHY CHEST CT ABDOMEN AND PELVIS WITH CONTRAST TECHNIQUE: Multidetector CT imaging of the chest was performed using the standard protocol during bolus administration of intravenous contrast. Multiplanar CT image reconstructions and MIPs were obtained to evaluate the vascular anatomy. Multidetector CT imaging of the abdomen and pelvis was performed using the standard protocol during bolus administration of intravenous contrast. CONTRAST:  188mL OMNIPAQUE IOHEXOL 350 MG/ML SOLN COMPARISON:  None. FINDINGS: CTA CHEST FINDINGS Cardiovascular: Suboptimal opacification of the pulmonary arteries limits evaluation beyond the lobar level. No large central or lobar pulmonary arterial filling defects are identified. Central pulmonary arteries are  normal caliber. Normal cardiac size. 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 The aorta is normal caliber. Shared origin of the brachiocephalic and left common carotid arteries. Great vessels are otherwise unremarkable. Mediastinum/Nodes: No enlarged mediastinal, hilar or axillary adenopathy is seen. No acute abnormality of the trachea or esophagus. Thyroid gland is normal. Asymmetric thickening of the left sternocleidomastoid is likely related to muscle activation. Lungs/Pleura: Respiratory motion artifact is mild. No consolidation, features of edema, pneumothorax, or effusion. No suspicious pulmonary nodules or masses. Musculoskeletal: No acute osseous abnormality or suspicious osseous  lesion in the imaged chest. No worrisome chest wall lesions. Mild bilateral gynecomastia is noted, right slightly greater left. Review of the MIP images confirms the above findings. CT ABDOMEN and PELVIS FINDINGS Hepatobiliary: No focal liver abnormality is seen. No gallstones, gallbladder wall thickening, or biliary dilatation. Pancreas: Unremarkable. No pancreatic ductal dilatation or surrounding inflammatory changes. Spleen: Normal in size without focal abnormality. Adrenals/Urinary Tract: Adrenal glands are unremarkable. Kidneys are normal, without renal calculi, focal lesion, or hydronephrosis. Bladder is unremarkable. Stomach/Bowel: Distal esophagus, stomach and duodenal sweep are unremarkable. No small bowel wall thickening or dilatation. No evidence of obstruction. A normal appendix is visualized. No colonic dilatation or wall thickening. Scattered colonic diverticula without focal pericolonic inflammation to suggest diverticulitis. Vascular/Lymphatic: There is extensive retroperitoneal and pelvic adenopathy, some of the larger nodes include a left periaortic node measuring up to 3.2 cm (2/43) and a 2 cm aortocaval lymph node (2/35) there is distal abdominal aortic encasement by a extensive retroperitoneal adenopathy. Furthermore there is likely direct compression and/or direct nodal involvement of the inferior vena cava (2/40 with the lower IVC appearing expanded by hypoattenuating filling defects. Edematous changes are noted throughout the pelvis with venous engorgement and collateralization. Reproductive: The prostate and seminal vesicles are unremarkable. There is a varicocele seen in the left scrotum. External genitalia are incompletely included on this exam. Other: There is circumferential body wall edema from the level of the pelvis inferiorly more pronounced throughout the left lower extremity. Free fluid is seen in the deep pelvis, likely reactive or secondary to venous occlusion with developing  collateralization. Musculoskeletal: No acute osseous abnormality or suspicious osseous lesion. Normal symmetric muscular enhancement. Review of the MIP images confirms the above findings. IMPRESSION: 1. Suboptimal opacification of the pulmonary arteries limits evaluation beyond the lobar level. No large central or lobar pulmonary arterial filling defects are identified. 2. Extensive retroperitoneal and pelvic adenopathy. There is likely direct compression of the lower IVC with the distal IVC and iliac veins appearing expanded by hypoattenuating thrombus. There is complete encasement of the abdominal aorta as well but without luminal narrowing. Findings are highly concerning for metastatic disease, particularly given patient history of testicular cancer. 3. Left scrotal varicocele. External genitalia are incompletely included on this exam. Consider further evaluation scrotal ultrasound given extensive retroperitoneal adenopathy and patient's history of testicular cancer. 4. Free fluid in the deep pelvis is likely reactive or secondary to venous occlusion with developing collateralization. 5. Colonic diverticulosis without evidence of diverticulitis. 6. Mild bilateral gynecomastia, right slightly greater left. 7. 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 Critical Value/emergent results were called by telephone at the time of interpretation on 04/11/2019 at 1:33 am to [place] , who verbally acknowledged these results. Electronically Signed   By: Lovena Le  M.D.   On: 04/11/2019 01:33   US Scrotum  Result Date: 04/11/2019 CLINICAL DATA:  Concern for testicular cancer EXAM: ULTRASOUND OF SCROTUM TECHNIQUE: Complete ultrasound examination of the testicles, epididymis, and other scrotal structures was performed. COMPARISON:  None. FINDINGS: Right testicle Measurements: Surgically removed. Left testicle Measurements: 3.9 x 2.2 x 2.6 cm. No  mass or microlithiasis visualized. Right epididymis:  Not visualized Left epididymis:  Normal in size and appearance. Hydrocele:  Moderate left hydrocele Varicocele:  Moderate left varicocele. IMPRESSION: No left testicular abnormality.  Prior right orchiectomy. Moderate left hydrocele and varicocele. Electronically Signed   By: Rolm Baptise M.D.   On: 04/11/2019 02:16   CT ABDOMEN PELVIS W CONTRAST  Result Date: 04/11/2019 CLINICAL DATA:  Positive lower extremity DVT, PE suspected. Back pain and leg swelling, history of testicular cancer. EXAM: CT ANGIOGRAPHY CHEST CT ABDOMEN AND PELVIS WITH CONTRAST TECHNIQUE: Multidetector CT imaging of the chest was performed using the standard protocol during bolus administration of intravenous contrast. Multiplanar CT image reconstructions and MIPs were obtained to evaluate the vascular anatomy. Multidetector CT imaging of the abdomen and pelvis was performed using the standard protocol during bolus administration of intravenous contrast. CONTRAST:  160mL OMNIPAQUE IOHEXOL 350 MG/ML SOLN COMPARISON:  None. FINDINGS: CTA CHEST FINDINGS Cardiovascular: Suboptimal opacification of the pulmonary arteries limits evaluation beyond the lobar level. No large central or lobar pulmonary arterial filling defects are identified. Central pulmonary arteries are normal caliber. Normal cardiac size. 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 The aorta is normal caliber. Shared origin of the brachiocephalic and left common carotid arteries. Great vessels are otherwise unremarkable. Mediastinum/Nodes: No enlarged mediastinal, hilar or axillary adenopathy is seen. No acute abnormality of the trachea or esophagus. Thyroid gland is normal. Asymmetric thickening of the left sternocleidomastoid is likely related to muscle activation. Lungs/Pleura: Respiratory motion artifact is mild. No consolidation, features of edema,  pneumothorax, or effusion. No suspicious pulmonary nodules or masses. Musculoskeletal: No acute osseous abnormality or suspicious osseous lesion in the imaged chest. No worrisome chest wall lesions. Mild bilateral gynecomastia is noted, right slightly greater left. Review of the MIP images confirms the above findings. CT ABDOMEN and PELVIS FINDINGS Hepatobiliary: No focal liver abnormality is seen. No gallstones, gallbladder wall thickening, or biliary dilatation. Pancreas: Unremarkable. No pancreatic ductal dilatation or surrounding inflammatory changes. Spleen: Normal in size without focal abnormality. Adrenals/Urinary Tract: Adrenal glands are unremarkable. Kidneys are normal, without renal calculi, focal lesion, or hydronephrosis. Bladder is unremarkable. Stomach/Bowel: Distal esophagus, stomach and duodenal sweep are unremarkable. No small bowel wall thickening or dilatation. No evidence of obstruction. A normal appendix is visualized. No colonic dilatation or wall thickening. Scattered colonic diverticula without focal pericolonic inflammation to suggest diverticulitis. Vascular/Lymphatic: There is extensive retroperitoneal and pelvic adenopathy, some of the larger nodes include a left periaortic node measuring up to 3.2 cm (2/43) and a 2 cm aortocaval lymph node (2/35) there is distal abdominal aortic encasement by a extensive retroperitoneal adenopathy. Furthermore there is likely direct compression and/or direct nodal involvement of the inferior vena cava (2/40 with the lower IVC appearing expanded by hypoattenuating filling defects. Edematous changes are noted throughout the pelvis with venous engorgement and collateralization. Reproductive: The prostate and seminal vesicles are unremarkable. There is a varicocele seen in the left scrotum. External genitalia are incompletely included on this exam. Other: There is circumferential body wall edema from the level of  the pelvis inferiorly more pronounced  throughout the left lower extremity. Free fluid is seen in the deep pelvis, likely reactive or secondary to venous occlusion with developing collateralization. Musculoskeletal: No acute osseous abnormality or suspicious osseous lesion. Normal symmetric muscular enhancement. Review of the MIP images confirms the above findings. IMPRESSION: 1. Suboptimal opacification of the pulmonary arteries limits evaluation beyond the lobar level. No large central or lobar pulmonary arterial filling defects are identified. 2. Extensive retroperitoneal and pelvic adenopathy. There is likely direct compression of the lower IVC with the distal IVC and iliac veins appearing expanded by hypoattenuating thrombus. There is complete encasement of the abdominal aorta as well but without luminal narrowing. Findings are highly concerning for metastatic disease, particularly given patient history of testicular cancer. 3. Left scrotal varicocele. External genitalia are incompletely included on this exam. Consider further evaluation scrotal ultrasound given extensive retroperitoneal adenopathy and patient's history of testicular cancer. 4. Free fluid in the deep pelvis is likely reactive or secondary to venous occlusion with developing collateralization. 5. Colonic diverticulosis without evidence of diverticulitis. 6. Mild bilateral gynecomastia, right slightly greater left. 7. 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 Critical Value/emergent results were called by telephone at the time of interpretation on 04/11/2019 at 1:33 am to [place] , who verbally acknowledged these results. Electronically Signed   By: Lovena Le M.D.   On: 04/11/2019 01:33   Old chart reviewed Case discussed with ED PA  Assessment/Plan 39 year old male with history of seminoma stage Ib testicular cancer status post radical orchiectomy in 2012 comes in with significant left lower  extremity DVT found to have worrisome CAT scan of abdomen and pelvis lymphadenopathy  Principal Problem:   DVT (deep venous thrombosis) (HCC)-Heparin drip.  Elevate leg.  Bed rest.  CTA negative for PE.  Active Problems:   H/O seminoma stage Ib testicular cancer 2012 status post right orchiectomy with no adjuvant treatment at that time with significant abnormality on CAT scan with diffuse lymphadenopathy in the pelvis and retroperitoneal area-check AFP LDH and beta-hCG levels.  Very worrisome for metastatic disease.  Call heme-onc in the morning for further evaluation.  Patient wishes to proceed with evaluation and treatment here if this is a recurrent cancer.  He is aware of the concerning findings of today.     DVT prophylaxis: Heparin Code Status: Full Family Communication: None Disposition Plan: Days Consults called: Call heme onc in the morning Admission status: Admission   Glenora Morocho A MD [facility]  If 7PM-7AM, please contact night-coverage [email or web address] Password [facility]  04/11/2019, 3:59 AM

## 2019-04-11 NOTE — Consult Note (Addendum)
Chief Complaint: Patient was seen in consultation today for CT-guided retroperitoneal lymph node biopsy/possible catheter directed thrombolytic therapy of extensive left lower extremity DVT Chief Complaint  Patient presents with  . Leg Swelling    Referring Physician(s): [name],Y  Supervising Physician: [name]  Patient Status: [facility] - In-pt  History of Present Illness: Timothy [name] is a 39 y.o. 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  He now presents to [facility] with history of back pain and progressive left lower extremity swelling.  Lower extremity venous Doppler study done today revealed extensive DVT involving left external iliac vein, common femoral vein, femoral vein, popliteal vein and left peroneal veins.  He is currently on IV heparin therapy.  CT chest abdomen pelvis revealed extensive retroperitoneal and pelvic adenopathy with likely direct compression of the lower IVC with the distal IVC and iliac veins appearing expanded by hypoattenuating thrombus.  There was complete encasement of the abdominal aorta as well but without luminal narrowing.  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  He is afebrile, COVID-19 negative, with normal CBC/PT and creatinine.  Request now received from oncology for CT-guided retroperitoneal lymph node biopsy for further evaluation as well as consideration for catheter directed thrombolytic therapy of extensive left lower extremity DVT.   Past Medical History:  Diagnosis Date  . Testicular cancer ([place])    stage Ib seminoma testicular cancer     Past Surgical History:  Procedure Laterality Date  . ORCHIECTOMY Right   . TUMOR REMOVAL      Allergies: Patient has no known allergies.  Medications: Prior to Admission  medications   Medication Sig Start Date End Date Taking? Authorizing Provider  cyclobenzaprine (FLEXERIL) 10 MG tablet Take 1 tablet (10 mg total) by mouth 3 (three) times daily as needed for muscle spasms. 04/04/19  Yes Lamptey, Myrene Galas, MD  ibuprofen (ADVIL) 600 MG tablet Take 1 tablet (600 mg total) by mouth every 6 (six) hours as needed. 04/04/19  Yes Lamptey, Myrene Galas, MD     Family History  Problem Relation Age of Onset  . Atrial fibrillation Mother     Social History   Socioeconomic History  . Marital status: Single    Spouse name: Not on file  . Number of children: Not on file  . Years of education: Not on file  . Highest education level: Not on file  Occupational History  . Not on file  Tobacco Use  . Smoking status: Never Smoker  . Smokeless tobacco: Never Used  Substance and Sexual Activity  . Alcohol use: Not Currently  . Drug use: Not on file  . Sexual activity: Not on file  Other Topics Concern  . Not on file  Social History Narrative  . Not on file   Social Determinants of Health   Financial Resource Strain:   . Difficulty of Paying Living Expenses: Not on file  Food Insecurity:   . Worried About Charity fundraiser in the Last Year: Not on file  . Ran Out of Food in the Last Year: Not on file  Transportation Needs:   . Lack of Transportation (Medical): Not on file  . Lack of Transportation (Non-Medical): Not on file  Physical Activity:   . Days of Exercise per Week: Not on file  . Minutes of  Exercise per Session: Not on file  Stress:   . Feeling of Stress : Not on file  Social Connections:   . Frequency of Communication with Friends and Family: Not on file  . Frequency of Social Gatherings with Friends and Family: Not on file  . Attends Religious Services: Not on file  . Active Member of Clubs or Organizations: Not on file  . Attends Archivist Meetings: Not on file  . Marital Status: Not on file      Review of Systems currently  denies fever, headache, chest pain, dyspnea, cough, abdominal pain, nausea, vomiting or bleeding.  He does have some back pain and left lower extremity swelling  Vital Signs: BP 117/75 (BP Location: Left Arm)   Pulse 86   Temp 98.3 F (36.8 C) (Oral)   Resp 18   Ht 6\' 3"  (1.905 m)   Wt 200 lb (90.7 kg)   SpO2 100%   BMI 25.00 kg/m   Physical Exam awake, alert.  Chest clear to auscultation bilaterally.  Heart with regular rate and rhythm.  Abdomen soft, positive bowel sounds, currently nontender.  Significant left lower extremity edema noted with associated erythema and tenderness.  Some trace pretibial edema on the right as well.  Imaging: CT Angio Chest PE W and/or Wo Contrast  Result Date: 04/11/2019 CLINICAL DATA:  Positive lower extremity DVT, PE suspected. Back pain and leg swelling, history of testicular cancer. EXAM: CT ANGIOGRAPHY CHEST CT ABDOMEN AND PELVIS WITH CONTRAST TECHNIQUE: Multidetector CT imaging of the chest was performed using the standard protocol during bolus administration of intravenous contrast. Multiplanar CT image reconstructions and MIPs were obtained to evaluate the vascular anatomy. Multidetector CT imaging of the abdomen and pelvis was performed using the standard protocol during bolus administration of intravenous contrast. CONTRAST:  13mL OMNIPAQUE IOHEXOL 350 MG/ML SOLN COMPARISON:  None. FINDINGS: CTA CHEST FINDINGS Cardiovascular: Suboptimal opacification of the pulmonary arteries limits evaluation beyond the lobar level. No large central or lobar pulmonary arterial filling defects are identified. Central pulmonary arteries are normal caliber. Normal cardiac size. 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 The aorta is normal caliber. Shared origin of the brachiocephalic and left common carotid arteries. Great vessels are otherwise unremarkable. Mediastinum/Nodes: No enlarged mediastinal,  hilar or axillary adenopathy is seen. No acute abnormality of the trachea or esophagus. Thyroid gland is normal. Asymmetric thickening of the left sternocleidomastoid is likely related to muscle activation. Lungs/Pleura: Respiratory motion artifact is mild. No consolidation, features of edema, pneumothorax, or effusion. No suspicious pulmonary nodules or masses. Musculoskeletal: No acute osseous abnormality or suspicious osseous lesion in the imaged chest. No worrisome chest wall lesions. Mild bilateral gynecomastia is noted, right slightly greater left. Review of the MIP images confirms the above findings. CT ABDOMEN and PELVIS FINDINGS Hepatobiliary: No focal liver abnormality is seen. No gallstones, gallbladder wall thickening, or biliary dilatation. Pancreas: Unremarkable. No pancreatic ductal dilatation or surrounding inflammatory changes. Spleen: Normal in size without focal abnormality. Adrenals/Urinary Tract: Adrenal glands are unremarkable. Kidneys are normal, without renal calculi, focal lesion, or hydronephrosis. Bladder is unremarkable. Stomach/Bowel: Distal esophagus, stomach and duodenal sweep are unremarkable. No small bowel wall thickening or dilatation. No evidence of obstruction. A normal appendix is visualized. No colonic dilatation or wall thickening. Scattered colonic diverticula without focal pericolonic inflammation to suggest diverticulitis. Vascular/Lymphatic: There is extensive retroperitoneal and pelvic adenopathy, some of the larger nodes  include a left periaortic node measuring up to 3.2 cm (2/43) and a 2 cm aortocaval lymph node (2/35) there is distal abdominal aortic encasement by a extensive retroperitoneal adenopathy. Furthermore there is likely direct compression and/or direct nodal involvement of the inferior vena cava (2/40 with the lower IVC appearing expanded by hypoattenuating filling defects. Edematous changes are noted throughout the pelvis with venous engorgement and  collateralization. Reproductive: The prostate and seminal vesicles are unremarkable. There is a varicocele seen in the left scrotum. External genitalia are incompletely included on this exam. Other: There is circumferential body wall edema from the level of the pelvis inferiorly more pronounced throughout the left lower extremity. Free fluid is seen in the deep pelvis, likely reactive or secondary to venous occlusion with developing collateralization. Musculoskeletal: No acute osseous abnormality or suspicious osseous lesion. Normal symmetric muscular enhancement. Review of the MIP images confirms the above findings. IMPRESSION: 1. Suboptimal opacification of the pulmonary arteries limits evaluation beyond the lobar level. No large central or lobar pulmonary arterial filling defects are identified. 2. Extensive retroperitoneal and pelvic adenopathy. There is likely direct compression of the lower IVC with the distal IVC and iliac veins appearing expanded by hypoattenuating thrombus. There is complete encasement of the abdominal aorta as well but without luminal narrowing. Findings are highly concerning for metastatic disease, particularly given patient history of testicular cancer. 3. Left scrotal varicocele. External genitalia are incompletely included on this exam. Consider further evaluation scrotal ultrasound given extensive retroperitoneal adenopathy and patient's history of testicular cancer. 4. Free fluid in the deep pelvis is likely reactive or secondary to venous occlusion with developing collateralization. 5. Colonic diverticulosis without evidence of diverticulitis. 6. Mild bilateral gynecomastia, right slightly greater left. 7. 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 Critical Value/emergent results were called by telephone at the time of interpretation on 04/11/2019 at 1:33 am to Snead , who verbally acknowledged these  results. Electronically Signed   By: Lovena Le M.D.   On: 04/11/2019 01:33   US Scrotum  Result Date: 04/11/2019 CLINICAL DATA:  Concern for testicular cancer EXAM: ULTRASOUND OF SCROTUM TECHNIQUE: Complete ultrasound examination of the testicles, epididymis, and other scrotal structures was performed. COMPARISON:  None. FINDINGS: Right testicle Measurements: Surgically removed. Left testicle Measurements: 3.9 x 2.2 x 2.6 cm. No mass or microlithiasis visualized. Right epididymis:  Not visualized Left epididymis:  Normal in size and appearance. Hydrocele:  Moderate left hydrocele Varicocele:  Moderate left varicocele. IMPRESSION: No left testicular abnormality.  Prior right orchiectomy. Moderate left hydrocele and varicocele. Electronically Signed   By: Rolm Baptise M.D.   On: 04/11/2019 02:16   CT ABDOMEN PELVIS W CONTRAST  Result Date: 04/11/2019 CLINICAL DATA:  Positive lower extremity DVT, PE suspected. Back pain and leg swelling, history of testicular cancer. EXAM: CT ANGIOGRAPHY CHEST CT ABDOMEN AND PELVIS WITH CONTRAST TECHNIQUE: Multidetector CT imaging of the chest was performed using the standard protocol during bolus administration of intravenous contrast. Multiplanar CT image reconstructions and MIPs were obtained to evaluate the vascular anatomy. Multidetector CT imaging of the abdomen and pelvis was performed using the standard protocol during bolus administration of intravenous contrast. CONTRAST:  159mL OMNIPAQUE IOHEXOL 350 MG/ML SOLN COMPARISON:  None. FINDINGS: CTA CHEST FINDINGS Cardiovascular: Suboptimal opacification of the pulmonary arteries limits evaluation beyond the lobar level. No large central or lobar pulmonary arterial filling defects are identified. Central pulmonary arteries are  normal caliber. Normal cardiac size. 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 The aorta is normal caliber. Shared origin of the  brachiocephalic and left common carotid arteries. Great vessels are otherwise unremarkable. Mediastinum/Nodes: No enlarged mediastinal, hilar or axillary adenopathy is seen. No acute abnormality of the trachea or esophagus. Thyroid gland is normal. Asymmetric thickening of the left sternocleidomastoid is likely related to muscle activation. Lungs/Pleura: Respiratory motion artifact is mild. No consolidation, features of edema, pneumothorax, or effusion. No suspicious pulmonary nodules or masses. Musculoskeletal: No acute osseous abnormality or suspicious osseous lesion in the imaged chest. No worrisome chest wall lesions. Mild bilateral gynecomastia is noted, right slightly greater left. Review of the MIP images confirms the above findings. CT ABDOMEN and PELVIS FINDINGS Hepatobiliary: No focal liver abnormality is seen. No gallstones, gallbladder wall thickening, or biliary dilatation. Pancreas: Unremarkable. No pancreatic ductal dilatation or surrounding inflammatory changes. Spleen: Normal in size without focal abnormality. Adrenals/Urinary Tract: Adrenal glands are unremarkable. Kidneys are normal, without renal calculi, focal lesion, or hydronephrosis. Bladder is unremarkable. Stomach/Bowel: Distal esophagus, stomach and duodenal sweep are unremarkable. No small bowel wall thickening or dilatation. No evidence of obstruction. A normal appendix is visualized. No colonic dilatation or wall thickening. Scattered colonic diverticula without focal pericolonic inflammation to suggest diverticulitis. Vascular/Lymphatic: There is extensive retroperitoneal and pelvic adenopathy, some of the larger nodes include a left periaortic node measuring up to 3.2 cm (2/43) and a 2 cm aortocaval lymph node (2/35) there is distal abdominal aortic encasement by a extensive retroperitoneal adenopathy. Furthermore there is likely direct compression and/or direct nodal involvement of the inferior vena cava (2/40 with the lower IVC  appearing expanded by hypoattenuating filling defects. Edematous changes are noted throughout the pelvis with venous engorgement and collateralization. Reproductive: The prostate and seminal vesicles are unremarkable. There is a varicocele seen in the left scrotum. External genitalia are incompletely included on this exam. Other: There is circumferential body wall edema from the level of the pelvis inferiorly more pronounced throughout the left lower extremity. Free fluid is seen in the deep pelvis, likely reactive or secondary to venous occlusion with developing collateralization. Musculoskeletal: No acute osseous abnormality or suspicious osseous lesion. Normal symmetric muscular enhancement. Review of the MIP images confirms the above findings. IMPRESSION: 1. Suboptimal opacification of the pulmonary arteries limits evaluation beyond the lobar level. No large central or lobar pulmonary arterial filling defects are identified. 2. Extensive retroperitoneal and pelvic adenopathy. There is likely direct compression of the lower IVC with the distal IVC and iliac veins appearing expanded by hypoattenuating thrombus. There is complete encasement of the abdominal aorta as well but without luminal narrowing. Findings are highly concerning for metastatic disease, particularly given patient history of testicular cancer. 3. Left scrotal varicocele. External genitalia are incompletely included on this exam. Consider further evaluation scrotal ultrasound given extensive retroperitoneal adenopathy and patient's history of testicular cancer. 4. Free fluid in the deep pelvis is likely reactive or secondary to venous occlusion with developing collateralization. 5. Colonic diverticulosis without evidence of diverticulitis. 6. Mild bilateral gynecomastia, right slightly greater left. 7. 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 Critical Value/emergent results were  called by telephone at the time of interpretation on 04/11/2019 at 1:33 am to Danielson , who verbally acknowledged these results. Electronically Signed   By: March Rummage  [facility] M.D.   On: 04/11/2019 01:33   VAS [place] LOWER EXTREMITY VENOUS (DVT)  Result Date: 04/11/2019  Lower Venous Study Indications: Swelling.  Risk Factors: None identified. Anticoagulation: Heparin. Limitations: Poor ultrasound/tissue interface and bowel gas. Comparison Study: No prior studies. Performing Technologist: Oliver Hum RVT  Examination Guidelines: A complete evaluation includes B-mode imaging, spectral Doppler, color Doppler, and power Doppler as needed of all accessible portions of each vessel. Bilateral testing is considered an integral part of a complete examination. Limited examinations for reoccurring indications may be performed as noted.  +---------+---------------+---------+-----------+----------+--------------+ RIGHT    CompressibilityPhasicitySpontaneityPropertiesThrombus Aging +---------+---------------+---------+-----------+----------+--------------+ CFV      Full           Yes      Yes                                 +---------+---------------+---------+-----------+----------+--------------+ SFJ      Full                                                        +---------+---------------+---------+-----------+----------+--------------+ FV Prox  Full                                                        +---------+---------------+---------+-----------+----------+--------------+ FV Mid   Full                                                        +---------+---------------+---------+-----------+----------+--------------+ FV DistalFull                                                        +---------+---------------+---------+-----------+----------+--------------+ PFV      Full                                                         +---------+---------------+---------+-----------+----------+--------------+ POP      Full           Yes      Yes                                 +---------+---------------+---------+-----------+----------+--------------+ PTV      Full                                                        +---------+---------------+---------+-----------+----------+--------------+ PERO     Full                                                        +---------+---------------+---------+-----------+----------+--------------+   +---------+---------------+---------+-----------+----------+--------------+  LEFT     CompressibilityPhasicitySpontaneityPropertiesThrombus Aging +---------+---------------+---------+-----------+----------+--------------+ CFV      None           No       No                   Acute          +---------+---------------+---------+-----------+----------+--------------+ SFJ      None                                         Acute          +---------+---------------+---------+-----------+----------+--------------+ FV Prox  None           No       No                   Acute          +---------+---------------+---------+-----------+----------+--------------+ FV Mid   None           No       No                   Acute          +---------+---------------+---------+-----------+----------+--------------+ FV DistalNone           No       No                   Acute          +---------+---------------+---------+-----------+----------+--------------+ POP      Partial        No       No                   Acute          +---------+---------------+---------+-----------+----------+--------------+ PTV      Full                                                        +---------+---------------+---------+-----------+----------+--------------+ PERO     None                                         Acute           +---------+---------------+---------+-----------+----------+--------------+ EIV      None           No       No                   Acute          +---------+---------------+---------+-----------+----------+--------------+ Unable to visualize CIV, or IVC due to bowel gas.    Summary: Right: There is no evidence of deep vein thrombosis in the lower extremity. No cystic structure found in the popliteal fossa. Left: Findings consistent with acute deep vein thrombosis involving the left external iliac vein, common femoral vein, left femoral vein, left popliteal vein, and left peroneal veins. No cystic structure found in the popliteal fossa. Unable to visualize CIV, or IVC due to bowel gas.  *See table(s) above for measurements and observations.    Preliminary     Labs:  CBC: Recent Labs  04/10/19 2320 04/11/19 0432  WBC 6.1 6.1  HGB 13.3 13.5  HCT 42.9 42.6  PLT 183 177    COAGS: No results for input(s): INR, APTT in the last 8760 hours.  BMP: Recent Labs    04/10/19 2320 04/11/19 0432  NA 140 138  K 4.3 4.1  CL 106 105  CO2 26 26  GLUCOSE 97 97  BUN 12 10  CALCIUM 9.0 8.8*  CREATININE 0.99 0.86  GFRNONAA >60 >60  GFRAA >60 >60    LIVER FUNCTION TESTS: Recent Labs    04/11/19 0432  BILITOT 0.8  AST 17  ALT 16  ALKPHOS 59  PROT 7.8  ALBUMIN 3.9    TUMOR MARKERS: No results for input(s): AFPTM, CEA, CA199, CHROMGRNA in the last 8760 hours.  Assessment and Plan: 40 y.o. male with history of testicular cancer/seminoma , s/p right radical orchiectomy at [facility] in 2012.  He now presents to [facility] with history of back pain and progressive left lower extremity swelling.  Lower extremity venous Doppler study done today revealed extensive DVT involving left external iliac vein, common femoral vein, femoral vein, popliteal vein and left peroneal veins.  He is currently on IV heparin therapy. CT chest abdomen pelvis revealed extensive  retroperitoneal and pelvic adenopathy with likely direct compression of the lower IVC with the distal IVC and iliac veins appearing expanded by hypoattenuating thrombus.  There was complete encasement of the abdominal aorta as well but without luminal narrowing.  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  He is afebrile, COVID-19 negative, with normal CBC/PT and creatinine.  Request now received from oncology for CT-guided retroperitoneal lymph node biopsy for further evaluationas well as consideration for catheter directed thrombolytic therapy of extensive left lower extremity DVT .  Imaging studies have been reviewed by Dr. Vernard Gambles.Risks and benefits of biopsy procedure was discussed with the patient  including, but not limited to bleeding, infection, damage to adjacent structures or low yield requiring additional tests.  All of the questions were answered and there is agreement to proceed.  Consent signed and in chart.  Retroperitoneal lymph node biopsy has been scheduled for 1/27; IV heparin will need to be stopped 3 hours prior to case.  Based on available clinical information and imaging studies patient would be a potential candidate for catheter directed thrombolytic therapy.  Details/risks associated with procedure have been discussed briefly with patient, including but not limited to, internal bleeding, infection, injury to adjacent structures, and inability to completely eradicate thrombus.  Should patient elect to undergo thrombolytic therapy he will need to be transferred to [facility] for procedure.  This usually requires ICU monitoring during procedure.  We will plan to follow-up with patient on 1/27 to assess his current response with IV heparin and allow patient to discuss further management with Dr. Earleen Newport (IR).     Thank you for this interesting  consult.  I greatly enjoyed meeting Zayd Coggeshall and look forward to participating in their care.  A copy of this report was sent to the requesting provider on this date.  Electronically Signed: D. Rowe Robert, PA-C 04/11/2019, 1:35 PM   I spent a total of 40 minutes  in face to face in clinical consultation, greater than 50% of which was counseling/coordinating care for CT-guided retroperitoneal lymph node biopsy

## 2019-04-11 NOTE — Consult Note (Addendum)
[place]  [phone] [phone]   MEDICAL ONCOLOGY - INITIAL CONSULTATION  Referral MD: Dr. Barb Merino  Reason for Referral: Left lower extremity DVT, retroperitoneal and pelvic lymphadenopathy, history of stage Ib seminoma testicular cancer diagnosed in 2012 at [facility]  HPI: Timothy [name] is a 39 year old male with a past medical history significant for stage Ib seminoma testicular cancer diagnosed in about 2012 at [facility], status post right orchiectomy and did not receive adjuvant chemotherapy.  Outside records through care everywhere show that he had been on surveillance with his most recent visit to urology on 04/13/2012.  Last CT scan of the chest abdomen pelvis prior to this admission was performed on 01/16/2011 which did not show any evidence of metastatic disease in the chest, abdomen, or pelvis.  Last AFP was checked on 05/03/2012 which was 6.1.  The patient lives in [place] but was in [place] visiting.  He was actually seen at the urgent care approximately 1 week prior to this admission due to lower back pain.  He was given NSAIDs and Flexeril.  He presented to the emergency room on 04/11/2019 with markedly increased swelling in his left lower extremity with pain.  The patient was found to have an extensive left lower extremity DVT.  He also underwent a CT of the chest, abdomen, pelvis which did not show an obvious PE but did show extensive retroperitoneal and pelvic adenopathy with direct compression of the lower IVC and distal IVC and iliac veins with complete encasement of the abdominal aorta.  Findings are concerning for metastatic disease given his history of testicular cancer.  Ultrasound of the scrotum did not show any abnormality left testicle, prior right orchiectomy, moderate left hydrocele and variceal.  The patient reports that he has been feeling well prior to this admission with exception of the back pain that he was seen  in urgent care recently.  He developed worsening left lower extremity swelling and pain over the past 1 to 2 days.  He has not had problems with anorexia, weight loss, night sweats.  No recent fevers or chills.  He denies headaches, dizziness, vision changes.  Denies chest discomfort, shortness of breath, cough.denies abdominal pain, nausea, vomiting, constipation, diarrhea.  He has not noticed any bleeding such as hematuria or melena.  The patient lives near [place].  He is engaged.  He has no children.  The patient works as an Forensic psychologist.  Denies alcohol and tobacco use.  Family history significant only for mother with atrial fibrillation.  He is not aware of any cancers blood disorders, or clotting disorders in his family history.  Medical oncology was asked see the patient to make recommendations regarding his retroperitoneal and pelvic lymphadenopathy and his left lower extremity DVT.   Past Medical History:  Diagnosis Date  . Testicular cancer ([place])    stage Ib seminoma testicular cancer   :  Past Surgical History:  Procedure Laterality Date  . ORCHIECTOMY Right   . TUMOR REMOVAL    :  Current Facility-Administered Medications  Medication Dose Route Frequency Provider Last Rate Last Admin  . 0.9 %  sodium chloride infusion  250 mL Intravenous PRN Phillips Grout, MD      . acetaminophen (TYLENOL) tablet 650 mg  650 mg Oral Q6H PRN Phillips Grout, MD   650 mg at 04/11/19 0855   Or  . acetaminophen (TYLENOL) suppository 650 mg  650 mg Rectal Q6H PRN Phillips Grout,  MD      . heparin ADULT infusion 100 units/mL (25000 units/289mL sodium chloride 0.45%)  1,700 Units/hr Intravenous Continuous Thomes Lolling, [facility] 17 mL/hr at 04/11/19 0219 1,700 Units/hr at 04/11/19 0219  . oxyCODONE (Oxy IR/ROXICODONE) immediate release tablet 5 mg  5 mg Oral Q4H PRN Derrill Kay A, MD      . sodium chloride (PF) 0.9 % injection           . sodium chloride flush (NS) 0.9 % injection 3 mL  3 mL  Intravenous Q12H David, Rachal A, MD      . sodium chloride flush (NS) 0.9 % injection 3 mL  3 mL Intravenous PRN Phillips Grout, MD         No Known Allergies:  Family History  Family history unknown: Yes  :  Social History   Socioeconomic History  . Marital status: Single    Spouse name: Not on file  . Number of children: Not on file  . Years of education: Not on file  . Highest education level: Not on file  Occupational History  . Not on file  Tobacco Use  . Smoking status: Never Smoker  Substance and Sexual Activity  . Alcohol use: Not on file  . Drug use: Not on file  . Sexual activity: Not on file  Other Topics Concern  . Not on file  Social History Narrative  . Not on file   Social Determinants of Health   Financial Resource Strain:   . Difficulty of Paying Living Expenses: Not on file  Food Insecurity:   . Worried About Charity fundraiser in the Last Year: Not on file  . Ran Out of Food in the Last Year: Not on file  Transportation Needs:   . Lack of Transportation (Medical): Not on file  . Lack of Transportation (Non-Medical): Not on file  Physical Activity:   . Days of Exercise per Week: Not on file  . Minutes of Exercise per Session: Not on file  Stress:   . Feeling of Stress : Not on file  Social Connections:   . Frequency of Communication with Friends and Family: Not on file  . Frequency of Social Gatherings with Friends and Family: Not on file  . Attends Religious Services: Not on file  . Active Member of Clubs or Organizations: Not on file  . Attends Archivist Meetings: Not on file  . Marital Status: Not on file  Intimate Partner Violence:   . Fear of Current or Ex-Partner: Not on file  . Emotionally Abused: Not on file  . Physically Abused: Not on file  . Sexually Abused: Not on file  :  Review of Systems: A comprehensive 14 point review of systems was negative except as noted in the HPI.  Exam: Patient Vitals for the past  24 hrs:  BP Temp Temp src Pulse Resp SpO2 Height Weight  04/11/19 0408 124/83 98.2 F (36.8 C) Oral 90 18 98 % -- --  04/11/19 0245 -- -- -- 86 16 98 % -- --  04/11/19 0230 120/81 -- -- 89 (!) 25 99 % -- --  04/11/19 0130 118/83 -- -- 85 (!) 23 100 % -- --  04/11/19 0030 (!) 126/92 -- -- 94 12 100 % -- --  04/11/19 0000 100/89 -- -- 92 20 98 % -- --  04/10/19 2237 124/84 -- -- (!) 106 18 100 % -- --  04/10/19 2005 (!) 127/92 99.5 F (  37.5 C) Oral (!) 109 17 98 % 6\' 3"  (1.905 m) 200 lb (90.7 kg)    General:  well-nourished in no acute distress.   Eyes:  no scleral icterus.   ENT:  There were no oropharyngeal lesions.   Neck was without thyromegaly.   Lymphatics:  Negative cervical, supraclavicular or axillary adenopathy.   Respiratory: lungs were clear bilaterally without wheezing or crackles.   Cardiovascular:  Regular rate and rhythm, S1/S2, without murmur, rub or gallop.  Right lower extremity without edema.  Left lower extremity with significant edema from the thigh down to the foot.  There is redness and tenderness with palpation. GI:  abdomen was soft, flat, nontender, nondistended, without organomegaly.   Musculoskeletal:  no spinal tenderness of palpation of vertebral spine.   Skin exam was without echymosis, petichae.   Neuro exam was nonfocal. Patient was alert and oriented.  Attention was good.  Language was appropriate.  Mood was normal without depression.  Speech was not pressured.  Thought content was not tangential.     Lab Results  Component Value Date   WBC 6.1 04/11/2019   HGB 13.5 04/11/2019   HCT 42.6 04/11/2019   PLT 177 04/11/2019   GLUCOSE 97 04/11/2019   ALT 16 04/11/2019   AST 17 04/11/2019   NA 138 04/11/2019   K 4.1 04/11/2019   CL 105 04/11/2019   CREATININE 0.86 04/11/2019   BUN 10 04/11/2019   CO2 26 04/11/2019    CT Angio Chest PE W and/or Wo Contrast  Result Date: 04/11/2019 CLINICAL DATA:  Positive lower extremity DVT, PE suspected. Back  pain and leg swelling, history of testicular cancer. EXAM: CT ANGIOGRAPHY CHEST CT ABDOMEN AND PELVIS WITH CONTRAST TECHNIQUE: Multidetector CT imaging of the chest was performed using the standard protocol during bolus administration of intravenous contrast. Multiplanar CT image reconstructions and MIPs were obtained to evaluate the vascular anatomy. Multidetector CT imaging of the abdomen and pelvis was performed using the standard protocol during bolus administration of intravenous contrast. CONTRAST:  149mL OMNIPAQUE IOHEXOL 350 MG/ML SOLN COMPARISON:  None. FINDINGS: CTA CHEST FINDINGS Cardiovascular: Suboptimal opacification of the pulmonary arteries limits evaluation beyond the lobar level. No large central or lobar pulmonary arterial filling defects are identified. Central pulmonary arteries are normal caliber. Normal cardiac size. 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 The aorta is normal caliber. Shared origin of the brachiocephalic and left common carotid arteries. Great vessels are otherwise unremarkable. Mediastinum/Nodes: No enlarged mediastinal, hilar or axillary adenopathy is seen. No acute abnormality of the trachea or esophagus. Thyroid gland is normal. Asymmetric thickening of the left sternocleidomastoid is likely related to muscle activation. Lungs/Pleura: Respiratory motion artifact is mild. No consolidation, features of edema, pneumothorax, or effusion. No suspicious pulmonary nodules or masses. Musculoskeletal: No acute osseous abnormality or suspicious osseous lesion in the imaged chest. No worrisome chest wall lesions. Mild bilateral gynecomastia is noted, right slightly greater left. Review of the MIP images confirms the above findings. CT ABDOMEN and PELVIS FINDINGS Hepatobiliary: No focal liver abnormality is seen. No gallstones, gallbladder wall thickening, or biliary dilatation. Pancreas: Unremarkable. No  pancreatic ductal dilatation or surrounding inflammatory changes. Spleen: Normal in size without focal abnormality. Adrenals/Urinary Tract: Adrenal glands are unremarkable. Kidneys are normal, without renal calculi, focal lesion, or hydronephrosis. Bladder is unremarkable. Stomach/Bowel: Distal esophagus, stomach and duodenal sweep are unremarkable. No small bowel wall thickening or  dilatation. No evidence of obstruction. A normal appendix is visualized. No colonic dilatation or wall thickening. Scattered colonic diverticula without focal pericolonic inflammation to suggest diverticulitis. Vascular/Lymphatic: There is extensive retroperitoneal and pelvic adenopathy, some of the larger nodes include a left periaortic node measuring up to 3.2 cm (2/43) and a 2 cm aortocaval lymph node (2/35) there is distal abdominal aortic encasement by a extensive retroperitoneal adenopathy. Furthermore there is likely direct compression and/or direct nodal involvement of the inferior vena cava (2/40 with the lower IVC appearing expanded by hypoattenuating filling defects. Edematous changes are noted throughout the pelvis with venous engorgement and collateralization. Reproductive: The prostate and seminal vesicles are unremarkable. There is a varicocele seen in the left scrotum. External genitalia are incompletely included on this exam. Other: There is circumferential body wall edema from the level of the pelvis inferiorly more pronounced throughout the left lower extremity. Free fluid is seen in the deep pelvis, likely reactive or secondary to venous occlusion with developing collateralization. Musculoskeletal: No acute osseous abnormality or suspicious osseous lesion. Normal symmetric muscular enhancement. Review of the MIP images confirms the above findings. IMPRESSION: 1. Suboptimal opacification of the pulmonary arteries limits evaluation beyond the lobar level. No large central or lobar pulmonary arterial filling defects are  identified. 2. Extensive retroperitoneal and pelvic adenopathy. There is likely direct compression of the lower IVC with the distal IVC and iliac veins appearing expanded by hypoattenuating thrombus. There is complete encasement of the abdominal aorta as well but without luminal narrowing. Findings are highly concerning for metastatic disease, particularly given patient history of testicular cancer. 3. Left scrotal varicocele. External genitalia are incompletely included on this exam. Consider further evaluation scrotal ultrasound given extensive retroperitoneal adenopathy and patient's history of testicular cancer. 4. Free fluid in the deep pelvis is likely reactive or secondary to venous occlusion with developing collateralization. 5. Colonic diverticulosis without evidence of diverticulitis. 6. Mild bilateral gynecomastia, right slightly greater left. 7. 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 Critical Value/emergent results were called by telephone at the time of interpretation on 04/11/2019 at 1:33 am to [place] , who verbally acknowledged these results. Electronically Signed   By: Lovena Le M.D.   On: 04/11/2019 01:33   US Scrotum  Result Date: 04/11/2019 CLINICAL DATA:  Concern for testicular cancer EXAM: ULTRASOUND OF SCROTUM TECHNIQUE: Complete ultrasound examination of the testicles, epididymis, and other scrotal structures was performed. COMPARISON:  None. FINDINGS: Right testicle Measurements: Surgically removed. Left testicle Measurements: 3.9 x 2.2 x 2.6 cm. No mass or microlithiasis visualized. Right epididymis:  Not visualized Left epididymis:  Normal in size and appearance. Hydrocele:  Moderate left hydrocele Varicocele:  Moderate left varicocele. IMPRESSION: No left testicular abnormality.  Prior right orchiectomy. Moderate left hydrocele and varicocele. Electronically Signed   By: Rolm Baptise M.D.   On:  04/11/2019 02:16   CT ABDOMEN PELVIS W CONTRAST  Result Date: 04/11/2019 CLINICAL DATA:  Positive lower extremity DVT, PE suspected. Back pain and leg swelling, history of testicular cancer. EXAM: CT ANGIOGRAPHY CHEST CT ABDOMEN AND PELVIS WITH CONTRAST TECHNIQUE: Multidetector CT imaging of the chest was performed using the standard protocol during bolus administration of intravenous contrast. Multiplanar CT image reconstructions and MIPs were obtained to evaluate the vascular anatomy. Multidetector CT imaging of the abdomen and pelvis was performed using the standard protocol during bolus administration of intravenous contrast. CONTRAST:  13mL OMNIPAQUE  IOHEXOL 350 MG/ML SOLN COMPARISON:  None. FINDINGS: CTA CHEST FINDINGS Cardiovascular: Suboptimal opacification of the pulmonary arteries limits evaluation beyond the lobar level. No large central or lobar pulmonary arterial filling defects are identified. Central pulmonary arteries are normal caliber. Normal cardiac size. 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 The aorta is normal caliber. Shared origin of the brachiocephalic and left common carotid arteries. Great vessels are otherwise unremarkable. Mediastinum/Nodes: No enlarged mediastinal, hilar or axillary adenopathy is seen. No acute abnormality of the trachea or esophagus. Thyroid gland is normal. Asymmetric thickening of the left sternocleidomastoid is likely related to muscle activation. Lungs/Pleura: Respiratory motion artifact is mild. No consolidation, features of edema, pneumothorax, or effusion. No suspicious pulmonary nodules or masses. Musculoskeletal: No acute osseous abnormality or suspicious osseous lesion in the imaged chest. No worrisome chest wall lesions. Mild bilateral gynecomastia is noted, right slightly greater left. Review of the MIP images confirms the above findings. CT ABDOMEN and PELVIS FINDINGS  Hepatobiliary: No focal liver abnormality is seen. No gallstones, gallbladder wall thickening, or biliary dilatation. Pancreas: Unremarkable. No pancreatic ductal dilatation or surrounding inflammatory changes. Spleen: Normal in size without focal abnormality. Adrenals/Urinary Tract: Adrenal glands are unremarkable. Kidneys are normal, without renal calculi, focal lesion, or hydronephrosis. Bladder is unremarkable. Stomach/Bowel: Distal esophagus, stomach and duodenal sweep are unremarkable. No small bowel wall thickening or dilatation. No evidence of obstruction. A normal appendix is visualized. No colonic dilatation or wall thickening. Scattered colonic diverticula without focal pericolonic inflammation to suggest diverticulitis. Vascular/Lymphatic: There is extensive retroperitoneal and pelvic adenopathy, some of the larger nodes include a left periaortic node measuring up to 3.2 cm (2/43) and a 2 cm aortocaval lymph node (2/35) there is distal abdominal aortic encasement by a extensive retroperitoneal adenopathy. Furthermore there is likely direct compression and/or direct nodal involvement of the inferior vena cava (2/40 with the lower IVC appearing expanded by hypoattenuating filling defects. Edematous changes are noted throughout the pelvis with venous engorgement and collateralization. Reproductive: The prostate and seminal vesicles are unremarkable. There is a varicocele seen in the left scrotum. External genitalia are incompletely included on this exam. Other: There is circumferential body wall edema from the level of the pelvis inferiorly more pronounced throughout the left lower extremity. Free fluid is seen in the deep pelvis, likely reactive or secondary to venous occlusion with developing collateralization. Musculoskeletal: No acute osseous abnormality or suspicious osseous lesion. Normal symmetric muscular enhancement. Review of the MIP images confirms the above findings. IMPRESSION: 1. Suboptimal  opacification of the pulmonary arteries limits evaluation beyond the lobar level. No large central or lobar pulmonary arterial filling defects are identified. 2. Extensive retroperitoneal and pelvic adenopathy. There is likely direct compression of the lower IVC with the distal IVC and iliac veins appearing expanded by hypoattenuating thrombus. There is complete encasement of the abdominal aorta as well but without luminal narrowing. Findings are highly concerning for metastatic disease, particularly given patient history of testicular cancer. 3. Left scrotal varicocele. External genitalia are incompletely included on this exam. Consider further evaluation scrotal ultrasound given extensive retroperitoneal adenopathy and patient's history of testicular cancer. 4. Free fluid in the deep pelvis is likely reactive or secondary to venous occlusion with developing collateralization. 5. Colonic diverticulosis without evidence of diverticulitis. 6. Mild bilateral gynecomastia, right slightly greater left. 7. 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 Critical Value/emergent results were called by telephone at the time of interpretation on 04/11/2019 at 1:33 am to [place] , who verbally acknowledged these results. Electronically Signed   By: Lovena Le M.D.   On: 04/11/2019 01:33     CT Angio Chest PE W and/or Wo Contrast  Result Date: 04/11/2019 CLINICAL DATA:  Positive lower extremity DVT, PE suspected. Back pain and leg swelling, history of testicular cancer. EXAM: CT ANGIOGRAPHY CHEST CT ABDOMEN AND PELVIS WITH CONTRAST TECHNIQUE: Multidetector CT imaging of the chest was performed using the standard protocol during bolus administration of intravenous contrast. Multiplanar CT image reconstructions and MIPs were obtained to evaluate the vascular anatomy. Multidetector CT imaging of the abdomen and pelvis was performed using the standard  protocol during bolus administration of intravenous contrast. CONTRAST:  154mL OMNIPAQUE IOHEXOL 350 MG/ML SOLN COMPARISON:  None. FINDINGS: CTA CHEST FINDINGS Cardiovascular: Suboptimal opacification of the pulmonary arteries limits evaluation beyond the lobar level. No large central or lobar pulmonary arterial filling defects are identified. Central pulmonary arteries are normal caliber. Normal cardiac size. 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 The aorta is normal caliber. Shared origin of the brachiocephalic and left common carotid arteries. Great vessels are otherwise unremarkable. Mediastinum/Nodes: No enlarged mediastinal, hilar or axillary adenopathy is seen. No acute abnormality of the trachea or esophagus. Thyroid gland is normal. Asymmetric thickening of the left sternocleidomastoid is likely related to muscle activation. Lungs/Pleura: Respiratory motion artifact is mild. No consolidation, features of edema, pneumothorax, or effusion. No suspicious pulmonary nodules or masses. Musculoskeletal: No acute osseous abnormality or suspicious osseous lesion in the imaged chest. No worrisome chest wall lesions. Mild bilateral gynecomastia is noted, right slightly greater left. Review of the MIP images confirms the above findings. CT ABDOMEN and PELVIS FINDINGS Hepatobiliary: No focal liver abnormality is seen. No gallstones, gallbladder wall thickening, or biliary dilatation. Pancreas: Unremarkable. No pancreatic ductal dilatation or surrounding inflammatory changes. Spleen: Normal in size without focal abnormality. Adrenals/Urinary Tract: Adrenal glands are unremarkable. Kidneys are normal, without renal calculi, focal lesion, or hydronephrosis. Bladder is unremarkable. Stomach/Bowel: Distal esophagus, stomach and duodenal sweep are unremarkable. No small bowel wall thickening or dilatation. No evidence of obstruction. A normal appendix is  visualized. No colonic dilatation or wall thickening. Scattered colonic diverticula without focal pericolonic inflammation to suggest diverticulitis. Vascular/Lymphatic: There is extensive retroperitoneal and pelvic adenopathy, some of the larger nodes include a left periaortic node measuring up to 3.2 cm (2/43) and a 2 cm aortocaval lymph node (2/35) there is distal abdominal aortic encasement by a extensive retroperitoneal adenopathy. Furthermore there is likely direct compression and/or direct nodal involvement of the inferior vena cava (2/40 with the lower IVC appearing expanded by hypoattenuating filling defects. Edematous changes are noted throughout the pelvis with venous engorgement and collateralization. Reproductive: The prostate and seminal vesicles are unremarkable. There is a varicocele seen in the left scrotum. External genitalia are incompletely included on this exam. Other: There is circumferential body wall edema from the level of the pelvis inferiorly more pronounced throughout the left lower extremity. Free fluid is seen in the deep pelvis, likely reactive or secondary to venous occlusion with developing collateralization. Musculoskeletal: No acute osseous abnormality or suspicious osseous lesion. Normal symmetric muscular enhancement. Review of the MIP images confirms the above findings. IMPRESSION: 1. Suboptimal opacification of the pulmonary arteries limits evaluation beyond the lobar  level. No large central or lobar pulmonary arterial filling defects are identified. 2. Extensive retroperitoneal and pelvic adenopathy. There is likely direct compression of the lower IVC with the distal IVC and iliac veins appearing expanded by hypoattenuating thrombus. There is complete encasement of the abdominal aorta as well but without luminal narrowing. Findings are highly concerning for metastatic disease, particularly given patient history of testicular cancer. 3. Left scrotal varicocele. External  genitalia are incompletely included on this exam. Consider further evaluation scrotal ultrasound given extensive retroperitoneal adenopathy and patient's history of testicular cancer. 4. Free fluid in the deep pelvis is likely reactive or secondary to venous occlusion with developing collateralization. 5. Colonic diverticulosis without evidence of diverticulitis. 6. Mild bilateral gynecomastia, right slightly greater left. 7. 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 Critical Value/emergent results were called by telephone at the time of interpretation on 04/11/2019 at 1:33 am to [place] , who verbally acknowledged these results. Electronically Signed   By: Lovena Le M.D.   On: 04/11/2019 01:33   US Scrotum  Result Date: 04/11/2019 CLINICAL DATA:  Concern for testicular cancer EXAM: ULTRASOUND OF SCROTUM TECHNIQUE: Complete ultrasound examination of the testicles, epididymis, and other scrotal structures was performed. COMPARISON:  None. FINDINGS: Right testicle Measurements: Surgically removed. Left testicle Measurements: 3.9 x 2.2 x 2.6 cm. No mass or microlithiasis visualized. Right epididymis:  Not visualized Left epididymis:  Normal in size and appearance. Hydrocele:  Moderate left hydrocele Varicocele:  Moderate left varicocele. IMPRESSION: No left testicular abnormality.  Prior right orchiectomy. Moderate left hydrocele and varicocele. Electronically Signed   By: Rolm Baptise M.D.   On: 04/11/2019 02:16   CT ABDOMEN PELVIS W CONTRAST  Result Date: 04/11/2019 CLINICAL DATA:  Positive lower extremity DVT, PE suspected. Back pain and leg swelling, history of testicular cancer. EXAM: CT ANGIOGRAPHY CHEST CT ABDOMEN AND PELVIS WITH CONTRAST TECHNIQUE: Multidetector CT imaging of the chest was performed using the standard protocol during bolus administration of intravenous contrast. Multiplanar CT image reconstructions and MIPs  were obtained to evaluate the vascular anatomy. Multidetector CT imaging of the abdomen and pelvis was performed using the standard protocol during bolus administration of intravenous contrast. CONTRAST:  162mL OMNIPAQUE IOHEXOL 350 MG/ML SOLN COMPARISON:  None. FINDINGS: CTA CHEST FINDINGS Cardiovascular: Suboptimal opacification of the pulmonary arteries limits evaluation beyond the lobar level. No large central or lobar pulmonary arterial filling defects are identified. Central pulmonary arteries are normal caliber. Normal cardiac size. 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 The aorta is normal caliber. Shared origin of the brachiocephalic and left common carotid arteries. Great vessels are otherwise unremarkable. Mediastinum/Nodes: No enlarged mediastinal, hilar or axillary adenopathy is seen. No acute abnormality of the trachea or esophagus. Thyroid gland is normal. Asymmetric thickening of the left sternocleidomastoid is likely related to muscle activation. Lungs/Pleura: Respiratory motion artifact is mild. No consolidation, features of edema, pneumothorax, or effusion. No suspicious pulmonary nodules or masses. Musculoskeletal: No acute osseous abnormality or suspicious osseous lesion in the imaged chest. No worrisome chest wall lesions. Mild bilateral gynecomastia is noted, right slightly greater left. Review of the MIP images confirms the above findings. CT ABDOMEN and PELVIS FINDINGS Hepatobiliary: No focal liver abnormality is seen. No gallstones, gallbladder wall thickening, or biliary dilatation. Pancreas: Unremarkable. No pancreatic ductal dilatation or surrounding inflammatory changes.  Spleen: Normal in size without focal abnormality. Adrenals/Urinary Tract: Adrenal glands are unremarkable. Kidneys are normal, without renal calculi, focal lesion, or hydronephrosis. Bladder is unremarkable. Stomach/Bowel: Distal esophagus, stomach  and duodenal sweep are unremarkable. No small bowel wall thickening or dilatation. No evidence of obstruction. A normal appendix is visualized. No colonic dilatation or wall thickening. Scattered colonic diverticula without focal pericolonic inflammation to suggest diverticulitis. Vascular/Lymphatic: There is extensive retroperitoneal and pelvic adenopathy, some of the larger nodes include a left periaortic node measuring up to 3.2 cm (2/43) and a 2 cm aortocaval lymph node (2/35) there is distal abdominal aortic encasement by a extensive retroperitoneal adenopathy. Furthermore there is likely direct compression and/or direct nodal involvement of the inferior vena cava (2/40 with the lower IVC appearing expanded by hypoattenuating filling defects. Edematous changes are noted throughout the pelvis with venous engorgement and collateralization. Reproductive: The prostate and seminal vesicles are unremarkable. There is a varicocele seen in the left scrotum. External genitalia are incompletely included on this exam. Other: There is circumferential body wall edema from the level of the pelvis inferiorly more pronounced throughout the left lower extremity. Free fluid is seen in the deep pelvis, likely reactive or secondary to venous occlusion with developing collateralization. Musculoskeletal: No acute osseous abnormality or suspicious osseous lesion. Normal symmetric muscular enhancement. Review of the MIP images confirms the above findings. IMPRESSION: 1. Suboptimal opacification of the pulmonary arteries limits evaluation beyond the lobar level. No large central or lobar pulmonary arterial filling defects are identified. 2. Extensive retroperitoneal and pelvic adenopathy. There is likely direct compression of the lower IVC with the distal IVC and iliac veins appearing expanded by hypoattenuating thrombus. There is complete encasement of the abdominal aorta as well but without luminal narrowing. Findings are highly  concerning for metastatic disease, particularly given patient history of testicular cancer. 3. Left scrotal varicocele. External genitalia are incompletely included on this exam. Consider further evaluation scrotal ultrasound given extensive retroperitoneal adenopathy and patient's history of testicular cancer. 4. Free fluid in the deep pelvis is likely reactive or secondary to venous occlusion with developing collateralization. 5. Colonic diverticulosis without evidence of diverticulitis. 6. Mild bilateral gynecomastia, right slightly greater left. 7. 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 Critical Value/emergent results were called by telephone at the time of interpretation on 04/11/2019 at 1:33 am to [place] , who verbally acknowledged these results. Electronically Signed   By: Lovena Le M.D.   On: 04/11/2019 01:33    Pathology:  Patient: Timothy [name], Timothy [name]    Path# [id number]     [place] MR # [id number]   Accessioned 01/16/2011       SURGICAL   Birthdate: [birth year] (Age 30) Loc: PACW3      PATHOLOGY    [address]  Gender: M         Spec. Taken 01/16/2011     [place], [place].      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   MARGINS:  SPERMATIC CORD MARGIN:  Uninvolved by tumor   VENOUS/LYMPHATIC INVASION:  Present   NOTE: The tumor has focal areas of necrosis and fibrosis. This case  was shown at the Daily Quality Assurance Conference.  Assessment and Plan:  1.  Retroperitoneal and pelvic lymphadenopathy 2.  Left lower extremity DVT 3.  History of stage Ib seminoma testicular cancer diagnosed in 2012, status post right orchiectomy-no adjuvant chemotherapy given  -Discussed the CT findings with the patient.  Findings concerning for possible recurrence of his prior testicular cancer. Recommend biopsy by interventional radiology to confirm diagnosis.  Further recommendations regarding treatment pending biopsy results. -Recommend continuation of heparin drip.  We can transition the patient to Xarelto or Eliquis prior to discharge.  Thank you for this referral.   [name], DNP, AGPCNP-BC, AOCNP  Addendum  I have seen the patient, examined him. I agree with the assessment and and plan and have edited the notes.   I have reviewed his outside medical records from [facility], and his CT scan and Doppler from this morning.  I reviewed his CT scan findings, which showed extensive retroperitoneal and pelvic adenopathy, highly suspicious for recurrent seminoma. His initial testicular cancer was diagnosed in 2012, late recurrence can happen.  We also discussed other possibilities, such as other metastatic cancer, or lymphoma.  I recommend IR core biopsy of the RP node, which is scheduled for tomorrow.  I discussed treatment option for recurrent seminoma, including chemotherapy, and the role of surgery if there is residual disease after chemo.  We discussed the high cure rate even with extensive recurrence.  Patient lives in [place], prefer to  be treated over there, and I will refer him back to [facility] medical oncology department.   We discussed his extensive DVT is probably related to his cancer recurrence.  Agree with heparin drip for now, until biopsy is done.  We discussed the outpatient management of, including Coumadin, Lovenox injection, and factor Xa inhibitor.  Patient prefer to have Xarelto, which is reasonable. Due to the significant leg edema, which has not much improved today, I reached out to IR and asked about thrombectomy or thrombolysis. I appreciate their assistance.   I will stop by again when I have biopsy result.   Truitt Merle  04/11/2019

## 2019-04-11 NOTE — Progress Notes (Signed)
Bilateral lower extremity venous duplex has been completed. Preliminary results can be found in CV Proc through chart review.  Results were given to the patient's nurse, Olene Craven.  04/11/19 10:52 AM Timothy [name] RVT

## 2019-04-11 NOTE — Progress Notes (Addendum)
ANTICOAGULATION CONSULT NOTE  Pharmacy Consult for Heparin Indication: DVT  No Known Allergies  Patient Measurements: Height: 6\' 3"  (190.5 cm) Weight: 200 lb (90.7 kg) IBW/kg (Calculated) : 84.5  Vital Signs: Temp: 98.2 F (36.8 C) (01/26 0408) Temp Source: Oral (01/26 0408) BP: 124/83 (01/26 0408) Pulse Rate: 90 (01/26 0408)  Labs: Recent Labs    04/10/19 2320 04/11/19 0432 04/11/19 0812  HGB 13.3 13.5  --   HCT 42.9 42.6  --   PLT 183 177  --   HEPARINUNFRC  --   --  0.59  CREATININE 0.99 0.86  --   TROPONINIHS 2  --   --     Estimated Creatinine Clearance: 141.9 mL/min (by C-G formula based on SCr of 0.86 mg/dL).  Medications:  Infusions:  . sodium chloride    . heparin 1,700 Units/hr (04/11/19 [id number])    Assessment: 39 yo M with hx testicular CA and recent travel from [place] presents with left leg pain & swelling. Ultrasound + L DVT in femoral vein.   Baseline INR, aPTT: not done  Prior anticoagulation: none  Significant events:  Today, 04/11/2019:  CBC: stable WNL  Most recent heparin level therapeutic on 1700 units/hr  No bleeding or infusion issues per nursing  Goal of Therapy: Heparin level 0.3-0.7 units/ml Monitor platelets by anticoagulation protocol: Yes  Plan:  Continue heparin IV infusion at 1700 units/hr  Recheck confirmatory heparin level with AM labs  Daily CBC and heparin level  Monitor for signs of bleeding or thrombosis  F/u plans for oral anticoagulation; pharmacy to provide education prior to discharge  Reuel Boom, PharmD, BCPS [phone] 04/11/2019, 8:36 AM

## 2019-04-11 NOTE — Progress Notes (Signed)
Doppler completed per attending physician notes. Pulse palpated and audible on doppler on left lower foot.

## 2019-04-11 NOTE — Progress Notes (Signed)
39 year old gentleman with history of testicular cancer, left orchiectomy, surveillance until 2014 stable travel to [facility] to see his mom 1 week ago.  He had mild back pain at that time which was nothing unusual.  Started developing more back pain, left leg gradually swollen since last 7 days.  #1. extensive lower extremity DVT, multiple pain DVT on the left leg: Started on heparin.  Continue heparin pending invasive studies, biopsies. Currently no evidence of compartment syndrome, will continue to monitor circumference of the leg, pain level and distal neurovascular status. CT scan of the chest with no evidence of PE.  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  Patient was treated at [facility] in 2012.  No follow-up since 2014. He is willing to establish diagnosis, if any long-term chemotherapy needed, he needs to decide whether he wants to be done at [facility] or he wants to go back to [place]. I have called and discussed case with oncology.

## 2019-04-12 ENCOUNTER — Inpatient Hospital Stay (HOSPITAL_COMMUNITY): Payer: BLUE CROSS/BLUE SHIELD

## 2019-04-12 ENCOUNTER — Other Ambulatory Visit: Payer: Self-pay

## 2019-04-12 ENCOUNTER — Encounter (HOSPITAL_COMMUNITY): Payer: Self-pay | Admitting: Family Medicine

## 2019-04-12 LAB — PROTEIN S, TOTAL: Protein S Ag, Total: 94 % (ref 60–150)

## 2019-04-12 LAB — LUPUS ANTICOAGULANT PANEL
DRVVT: 43.8 s (ref 0.0–47.0)
PTT Lupus Anticoagulant: 40.4 s (ref 0.0–51.9)

## 2019-04-12 LAB — CBC
HCT: 40.2 % (ref 39.0–52.0)
Hemoglobin: 12.8 g/dL — ABNORMAL LOW (ref 13.0–17.0)
MCH: 29.5 pg (ref 26.0–34.0)
MCHC: 31.8 g/dL (ref 30.0–36.0)
MCV: 92.6 fL (ref 80.0–100.0)
Platelets: 204 10*3/uL (ref 150–400)
RBC: 4.34 MIL/uL (ref 4.22–5.81)
RDW: 13.6 % (ref 11.5–15.5)
WBC: 7.3 10*3/uL (ref 4.0–10.5)
nRBC: 0 % (ref 0.0–0.2)

## 2019-04-12 LAB — PROTEIN S ACTIVITY: Protein S Activity: 106 % (ref 63–140)

## 2019-04-12 LAB — AFP TUMOR MARKER: AFP, Serum, Tumor Marker: 5.5 ng/mL (ref 0.0–8.3)

## 2019-04-12 LAB — HOMOCYSTEINE: Homocysteine: 9.5 umol/L (ref 0.0–14.5)

## 2019-04-12 LAB — PROTEIN C, TOTAL: Protein C, Total: 104 % (ref 60–150)

## 2019-04-12 LAB — BETA HCG QUANT (REF LAB): hCG Quant: 32 m[IU]/mL — ABNORMAL HIGH (ref 0–3)

## 2019-04-12 LAB — HEPARIN LEVEL (UNFRACTIONATED): Heparin Unfractionated: 0.6 IU/mL (ref 0.30–0.70)

## 2019-04-12 LAB — PROTEIN C ACTIVITY: Protein C Activity: 135 % (ref 73–180)

## 2019-04-12 MED ORDER — MIDAZOLAM HCL 2 MG/2ML IJ SOLN
INTRAMUSCULAR | Status: AC | PRN
  Administered 2019-04-12 (×2): 1 mg via INTRAVENOUS

## 2019-04-12 MED ORDER — HYDROMORPHONE HCL 1 MG/ML IJ SOLN
0.5000 mg | INTRAMUSCULAR | Status: DC | PRN
  Administered 2019-04-12 – 2019-04-24 (×42): 0.5 mg via INTRAVENOUS
  Filled 2019-04-12 (×3): qty 1
  Filled 2019-04-12 (×3): qty 0.5
  Filled 2019-04-12 (×4): qty 1
  Filled 2019-04-12: qty 0.5
  Filled 2019-04-12 (×3): qty 1
  Filled 2019-04-12 (×2): qty 0.5
  Filled 2019-04-12 (×5): qty 1
  Filled 2019-04-12: qty 0.5
  Filled 2019-04-12 (×3): qty 1
  Filled 2019-04-12 (×2): qty 0.5
  Filled 2019-04-12 (×2): qty 1
  Filled 2019-04-12: qty 0.5
  Filled 2019-04-12: qty 1
  Filled 2019-04-12: qty 0.5
  Filled 2019-04-12: qty 1
  Filled 2019-04-12 (×2): qty 0.5
  Filled 2019-04-12 (×2): qty 1
  Filled 2019-04-12: qty 0.5
  Filled 2019-04-12: qty 1
  Filled 2019-04-12: qty 0.5
  Filled 2019-04-12 (×2): qty 1

## 2019-04-12 MED ORDER — MIDAZOLAM HCL 2 MG/2ML IJ SOLN
INTRAMUSCULAR | Status: AC
  Filled 2019-04-12: qty 4

## 2019-04-12 MED ORDER — FENTANYL CITRATE (PF) 100 MCG/2ML IJ SOLN
INTRAMUSCULAR | Status: AC | PRN
  Administered 2019-04-12 (×2): 50 ug via INTRAVENOUS

## 2019-04-12 MED ORDER — LIDOCAINE-EPINEPHRINE 1 %-1:100000 IJ SOLN
INTRAMUSCULAR | Status: AC | PRN
  Administered 2019-04-12: 10 mL

## 2019-04-12 MED ORDER — GADOBUTROL 1 MMOL/ML IV SOLN
10.0000 mL | Freq: Once | INTRAVENOUS | Status: AC | PRN
  Administered 2019-04-13: 10 mL via INTRAVENOUS

## 2019-04-12 MED ORDER — FLUMAZENIL 0.5 MG/5ML IV SOLN
INTRAVENOUS | Status: AC
  Filled 2019-04-12: qty 5

## 2019-04-12 MED ORDER — NALOXONE HCL 0.4 MG/ML IJ SOLN
INTRAMUSCULAR | Status: AC
  Filled 2019-04-12: qty 1

## 2019-04-12 MED ORDER — FENTANYL CITRATE (PF) 100 MCG/2ML IJ SOLN
INTRAMUSCULAR | Status: AC
  Filled 2019-04-12: qty 4

## 2019-04-12 NOTE — Progress Notes (Signed)
PROGRESS NOTE    Timothy [name]  [id number] DOB: October 13, 1980 DOA: 04/10/2019 PCP: Patient, No Pcp Per    Brief Narrative:  39 year old gentleman with history of testicular cancer stage Ib, left orchiectomy in 2012 at [facility], last surveillance in 2014 at [facility] travel to [place] about a week ago to see his mother and he started having gradual worsening swelling of the left leg and some back pain.  Came to the emergency room with increased swelling, he was found to have extensive left lower extremity DVTs in all veins and intra-abdominal and retroperitoneal lymphadenopathy consistent with metastatic cancer.   Assessment & Plan:   Principal Problem:   DVT (deep venous thrombosis) (HCC) Active Problems:   H/O testicular cancer   Lymphadenopathy, retroperitoneal  Acute DVT, multiple veins left lower extremity: With significant symptoms and swelling. Remains on heparin, will continue until patient undergoes invasive procedures. Continue to monitor, currently no evidence of compartment syndrome or hemodynamic compromise. Interventional radiology also following for thrombolysis if needed.  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  For CT-guided biopsy today.  Followed by oncology.  Once patient is stable and diagnosis established, he will probably like to go back to [facility] for treatment.   DVT prophylaxis: Heparin infusion Code Status: Full code Family Communication: None Disposition Plan: patient is from home. Anticipated DC to home, Barriers to discharge on heparin infusion.  For biopsy.   Consultants:   Oncology  IR  Procedures:   CT-guided biopsy, planned for today  Antimicrobials:   None   Subjective: Patient seen and examined.  No overnight events.  His leg is swollen, pain is managed with Tylenol.  Objective: Vitals:   04/11/19 0408 04/11/19 1234 04/11/19 2040 04/12/19 0506  BP: 124/83 117/75 119/71 121/78  Pulse: 90 86  91 85  Resp: 18 18 18 18   Temp: 98.2 F (36.8 C) 98.3 F (36.8 C) 98.9 F (37.2 C) 98.4 F (36.9 C)  TempSrc: Oral Oral Oral Oral  SpO2: 98% 100% 99% 98%  Weight:      Height:        Intake/Output Summary (Last 24 hours) at 04/12/2019 1100 Last data filed at 04/12/2019 0803 Gross per 24 hour  Intake 434.55 ml  Output 1180 ml  Net -745.45 ml   Filed Weights   04/10/19 2005  Weight: 90.7 kg    Examination:  General exam: Appears calm and comfortable, on room air. Respiratory system: Clear to auscultation. Respiratory effort normal.  No added sounds. Cardiovascular system: S1 & S2 heard, RRR. No JVD, murmurs, rubs, gallops or clicks.  Gastrointestinal system: Abdomen is nondistended, soft and nontender. No organomegaly or masses felt. Normal bowel sounds heard. Central nervous system: Alert and oriented. No focal neurological deficits. Extremities: Symmetric 5 x 5 power. Skin: No rashes, lesions or ulcers Psychiatry: Judgement and insight appear normal. Mood & affect appropriate.   Left leg is diffusely swollen from groin to foot.  Circumference remains stable.  Distal neurovascular status intact.       Data Reviewed: I have personally reviewed following labs and imaging studies  CBC: Recent Labs  Lab 04/10/19 2320 04/11/19 0432 04/12/19 0318  WBC 6.1 6.1 7.3  HGB 13.3 13.5 12.8*  HCT 42.9 42.6 40.2  MCV 94.5 94.2 92.6  PLT 183 177 0000000   Basic Metabolic Panel: Recent Labs  Lab 04/10/19 2320 04/11/19 0432  NA 140 138  K 4.3 4.1  CL 106 105  CO2 26 26  GLUCOSE 97  97  BUN 12 10  CREATININE 0.99 0.86  CALCIUM 9.0 8.8*   GFR: Estimated Creatinine Clearance: 141.9 mL/min (by C-G formula based on SCr of 0.86 mg/dL). Liver Function Tests: Recent Labs  Lab 04/11/19 0432  AST 17  ALT 16  ALKPHOS 59  BILITOT 0.8  PROT 7.8  ALBUMIN 3.9   No results for input(s): LIPASE, AMYLASE in the last 168 hours. No results for input(s): AMMONIA in the last 168  hours. Coagulation Profile: Recent Labs  Lab 04/11/19 1258  INR 1.0   Cardiac Enzymes: No results for input(s): CKTOTAL, CKMB, CKMBINDEX, TROPONINI in the last 168 hours. BNP (last 3 results) No results for input(s): PROBNP in the last 8760 hours. HbA1C: No results for input(s): HGBA1C in the last 72 hours. CBG: No results for input(s): GLUCAP in the last 168 hours. Lipid Profile: No results for input(s): CHOL, HDL, LDLCALC, TRIG, CHOLHDL, LDLDIRECT in the last 72 hours. Thyroid Function Tests: No results for input(s): TSH, T4TOTAL, FREET4, T3FREE, THYROIDAB in the last 72 hours. Anemia Panel: No results for input(s): VITAMINB12, FOLATE, FERRITIN, TIBC, IRON, RETICCTPCT in the last 72 hours. Sepsis Labs: No results for input(s): PROCALCITON, LATICACIDVEN in the last 168 hours.  Recent Results (from the past 240 hour(s))  SARS CORONAVIRUS 2 (TAT 6-24 HRS) Nasopharyngeal Nasopharyngeal Swab     Status: None   Collection Time: 04/11/19 12:32 AM   Specimen: Nasopharyngeal Swab  Result Value Ref Range Status   SARS Coronavirus 2 NEGATIVE NEGATIVE Final    Comment: (NOTE) SARS-CoV-2 target nucleic acids are NOT DETECTED. The SARS-CoV-2 RNA is generally detectable in upper and lower respiratory specimens during the acute phase of infection. Negative results do not preclude SARS-CoV-2 infection, do not rule out co-infections with other pathogens, and should not be used as the sole basis for treatment or other patient management decisions. Negative results must be combined with clinical observations, patient history, and epidemiological information. The expected result is Negative. Fact Sheet for Patients: SugarRoll.be Fact Sheet for Healthcare Providers: https://www.woods-mathews.com/ This test is not yet approved or cleared by the [place] FDA and  has been authorized for detection and/or diagnosis of SARS-CoV-2 by FDA under an  Emergency Use Authorization (EUA). This EUA will remain  in effect (meaning this test can be used) for the duration of the COVID-19 declaration under Section 56 4(b)(1) of the Act, 21 U.S.C. section 360bbb-3(b)(1), unless the authorization is terminated or revoked sooner. Performed at [facility], [place] [address], [place], [place] [address]          Radiology Studies: CT Angio Chest PE W and/or Wo Contrast  Result Date: 04/11/2019 CLINICAL DATA:  Positive lower extremity DVT, PE suspected. Back pain and leg swelling, history of testicular cancer. EXAM: CT ANGIOGRAPHY CHEST CT ABDOMEN AND PELVIS WITH CONTRAST TECHNIQUE: Multidetector CT imaging of the chest was performed using the standard protocol during bolus administration of intravenous contrast. Multiplanar CT image reconstructions and MIPs were obtained to evaluate the vascular anatomy. Multidetector CT imaging of the abdomen and pelvis was performed using the standard protocol during bolus administration of intravenous contrast. CONTRAST:  141mL OMNIPAQUE IOHEXOL 350 MG/ML SOLN COMPARISON:  None. FINDINGS: CTA CHEST FINDINGS Cardiovascular: Suboptimal opacification of the pulmonary arteries limits evaluation beyond the lobar level. No large central or lobar pulmonary arterial filling defects are identified. Central pulmonary arteries are normal caliber. Normal cardiac size. Slight mass effect on the right heart by a mild pectus deformity of the chest  wall with a Haller index of 3.2. The aorta is normal caliber. Shared origin of the brachiocephalic and left common carotid arteries. Great vessels are otherwise unremarkable. Mediastinum/Nodes: No enlarged mediastinal, hilar or axillary adenopathy is seen. No acute abnormality of the trachea or esophagus. Thyroid gland is normal. Asymmetric thickening of the left sternocleidomastoid is likely related to muscle activation. Lungs/Pleura: Respiratory motion artifact is mild. No consolidation,  features of edema, pneumothorax, or effusion. No suspicious pulmonary nodules or masses. Musculoskeletal: No acute osseous abnormality or suspicious osseous lesion in the imaged chest. No worrisome chest wall lesions. Mild bilateral gynecomastia is noted, right slightly greater left. Review of the MIP images confirms the above findings. CT ABDOMEN and PELVIS FINDINGS Hepatobiliary: No focal liver abnormality is seen. No gallstones, gallbladder wall thickening, or biliary dilatation. Pancreas: Unremarkable. No pancreatic ductal dilatation or surrounding inflammatory changes. Spleen: Normal in size without focal abnormality. Adrenals/Urinary Tract: Adrenal glands are unremarkable. Kidneys are normal, without renal calculi, focal lesion, or hydronephrosis. Bladder is unremarkable. Stomach/Bowel: Distal esophagus, stomach and duodenal sweep are unremarkable. No small bowel wall thickening or dilatation. No evidence of obstruction. A normal appendix is visualized. No colonic dilatation or wall thickening. Scattered colonic diverticula without focal pericolonic inflammation to suggest diverticulitis. Vascular/Lymphatic: There is extensive retroperitoneal and pelvic adenopathy, some of the larger nodes include a left periaortic node measuring up to 3.2 cm (2/43) and a 2 cm aortocaval lymph node (2/35) there is distal abdominal aortic encasement by a extensive retroperitoneal adenopathy. Furthermore there is likely direct compression and/or direct nodal involvement of the inferior vena cava (2/40 with the lower IVC appearing expanded by hypoattenuating filling defects. Edematous changes are noted throughout the pelvis with venous engorgement and collateralization. Reproductive: The prostate and seminal vesicles are unremarkable. There is a varicocele seen in the left scrotum. External genitalia are incompletely included on this exam. Other: There is circumferential body wall edema from the level of the pelvis inferiorly  more pronounced throughout the left lower extremity. Free fluid is seen in the deep pelvis, likely reactive or secondary to venous occlusion with developing collateralization. Musculoskeletal: No acute osseous abnormality or suspicious osseous lesion. Normal symmetric muscular enhancement. Review of the MIP images confirms the above findings. IMPRESSION: 1. Suboptimal opacification of the pulmonary arteries limits evaluation beyond the lobar level. No large central or lobar pulmonary arterial filling defects are identified. 2. Extensive retroperitoneal and pelvic adenopathy. There is likely direct compression of the lower IVC with the distal IVC and iliac veins appearing expanded by hypoattenuating thrombus. There is complete encasement of the abdominal aorta as well but without luminal narrowing. Findings are highly concerning for metastatic disease, particularly given patient history of testicular cancer. 3. Left scrotal varicocele. External genitalia are incompletely included on this exam. Consider further evaluation scrotal ultrasound given extensive retroperitoneal adenopathy and patient's history of testicular cancer. 4. Free fluid in the deep pelvis is likely reactive or secondary to venous occlusion with developing collateralization. 5. Colonic diverticulosis without evidence of diverticulitis. 6. Mild bilateral gynecomastia, right slightly greater left. 7. 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 Critical Value/emergent results were called by telephone at the time of interpretation on 04/11/2019 at 1:33 am to [place] , who verbally acknowledged these results. Electronically Signed   By: Lovena Le M.D.   On: 04/11/2019 01:33   US Scrotum  Result Date: 04/11/2019 CLINICAL DATA:  Concern for testicular  cancer EXAM: ULTRASOUND OF SCROTUM TECHNIQUE: Complete ultrasound examination of the testicles, epididymis, and other scrotal  structures was performed. COMPARISON:  None. FINDINGS: Right testicle Measurements: Surgically removed. Left testicle Measurements: 3.9 x 2.2 x 2.6 cm. No mass or microlithiasis visualized. Right epididymis:  Not visualized Left epididymis:  Normal in size and appearance. Hydrocele:  Moderate left hydrocele Varicocele:  Moderate left varicocele. IMPRESSION: No left testicular abnormality.  Prior right orchiectomy. Moderate left hydrocele and varicocele. Electronically Signed   By: Rolm Baptise M.D.   On: 04/11/2019 02:16   CT ABDOMEN PELVIS W CONTRAST  Result Date: 04/11/2019 CLINICAL DATA:  Positive lower extremity DVT, PE suspected. Back pain and leg swelling, history of testicular cancer. EXAM: CT ANGIOGRAPHY CHEST CT ABDOMEN AND PELVIS WITH CONTRAST TECHNIQUE: Multidetector CT imaging of the chest was performed using the standard protocol during bolus administration of intravenous contrast. Multiplanar CT image reconstructions and MIPs were obtained to evaluate the vascular anatomy. Multidetector CT imaging of the abdomen and pelvis was performed using the standard protocol during bolus administration of intravenous contrast. CONTRAST:  136mL OMNIPAQUE IOHEXOL 350 MG/ML SOLN COMPARISON:  None. FINDINGS: CTA CHEST FINDINGS Cardiovascular: Suboptimal opacification of the pulmonary arteries limits evaluation beyond the lobar level. No large central or lobar pulmonary arterial filling defects are identified. Central pulmonary arteries are normal caliber. Normal cardiac size. 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 The aorta is normal caliber. Shared origin of the brachiocephalic and left common carotid arteries. Great vessels are otherwise unremarkable. Mediastinum/Nodes: No enlarged mediastinal, hilar or axillary adenopathy is seen. No acute abnormality of the trachea or esophagus. Thyroid gland is normal. Asymmetric thickening of the left  sternocleidomastoid is likely related to muscle activation. Lungs/Pleura: Respiratory motion artifact is mild. No consolidation, features of edema, pneumothorax, or effusion. No suspicious pulmonary nodules or masses. Musculoskeletal: No acute osseous abnormality or suspicious osseous lesion in the imaged chest. No worrisome chest wall lesions. Mild bilateral gynecomastia is noted, right slightly greater left. Review of the MIP images confirms the above findings. CT ABDOMEN and PELVIS FINDINGS Hepatobiliary: No focal liver abnormality is seen. No gallstones, gallbladder wall thickening, or biliary dilatation. Pancreas: Unremarkable. No pancreatic ductal dilatation or surrounding inflammatory changes. Spleen: Normal in size without focal abnormality. Adrenals/Urinary Tract: Adrenal glands are unremarkable. Kidneys are normal, without renal calculi, focal lesion, or hydronephrosis. Bladder is unremarkable. Stomach/Bowel: Distal esophagus, stomach and duodenal sweep are unremarkable. No small bowel wall thickening or dilatation. No evidence of obstruction. A normal appendix is visualized. No colonic dilatation or wall thickening. Scattered colonic diverticula without focal pericolonic inflammation to suggest diverticulitis. Vascular/Lymphatic: There is extensive retroperitoneal and pelvic adenopathy, some of the larger nodes include a left periaortic node measuring up to 3.2 cm (2/43) and a 2 cm aortocaval lymph node (2/35) there is distal abdominal aortic encasement by a extensive retroperitoneal adenopathy. Furthermore there is likely direct compression and/or direct nodal involvement of the inferior vena cava (2/40 with the lower IVC appearing expanded by hypoattenuating filling defects. Edematous changes are noted throughout the pelvis with venous engorgement and collateralization. Reproductive: The prostate and seminal vesicles are unremarkable. There is a varicocele seen in the left scrotum. External genitalia  are incompletely included on this exam. Other: There is circumferential body wall edema from the level of the pelvis inferiorly more pronounced throughout the left lower extremity. Free fluid is seen in the deep pelvis, likely  reactive or secondary to venous occlusion with developing collateralization. Musculoskeletal: No acute osseous abnormality or suspicious osseous lesion. Normal symmetric muscular enhancement. Review of the MIP images confirms the above findings. IMPRESSION: 1. Suboptimal opacification of the pulmonary arteries limits evaluation beyond the lobar level. No large central or lobar pulmonary arterial filling defects are identified. 2. Extensive retroperitoneal and pelvic adenopathy. There is likely direct compression of the lower IVC with the distal IVC and iliac veins appearing expanded by hypoattenuating thrombus. There is complete encasement of the abdominal aorta as well but without luminal narrowing. Findings are highly concerning for metastatic disease, particularly given patient history of testicular cancer. 3. Left scrotal varicocele. External genitalia are incompletely included on this exam. Consider further evaluation scrotal ultrasound given extensive retroperitoneal adenopathy and patient's history of testicular cancer. 4. Free fluid in the deep pelvis is likely reactive or secondary to venous occlusion with developing collateralization. 5. Colonic diverticulosis without evidence of diverticulitis. 6. Mild bilateral gynecomastia, right slightly greater left. 7. 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 Critical Value/emergent results were called by telephone at the time of interpretation on 04/11/2019 at 1:33 am to [place] , who verbally acknowledged these results. Electronically Signed   By: Lovena Le M.D.   On: 04/11/2019 01:33   VAS [place] LOWER EXTREMITY VENOUS (DVT)  Result Date: 04/11/2019  Lower  Venous Study Indications: Swelling.  Risk Factors: None identified. Anticoagulation: Heparin. Limitations: Poor ultrasound/tissue interface and bowel gas. Comparison Study: No prior studies. Performing Technologist: Oliver Hum RVT  Examination Guidelines: A complete evaluation includes B-mode imaging, spectral Doppler, color Doppler, and power Doppler as needed of all accessible portions of each vessel. Bilateral testing is considered an integral part of a complete examination. Limited examinations for reoccurring indications may be performed as noted.  +---------+---------------+---------+-----------+----------+--------------+ RIGHT    CompressibilityPhasicitySpontaneityPropertiesThrombus Aging +---------+---------------+---------+-----------+----------+--------------+ CFV      Full           Yes      Yes                                 +---------+---------------+---------+-----------+----------+--------------+ SFJ      Full                                                        +---------+---------------+---------+-----------+----------+--------------+ FV Prox  Full                                                        +---------+---------------+---------+-----------+----------+--------------+ FV Mid   Full                                                        +---------+---------------+---------+-----------+----------+--------------+ FV DistalFull                                                        +---------+---------------+---------+-----------+----------+--------------+  PFV      Full                                                        +---------+---------------+---------+-----------+----------+--------------+ POP      Full           Yes      Yes                                 +---------+---------------+---------+-----------+----------+--------------+ PTV      Full                                                         +---------+---------------+---------+-----------+----------+--------------+ PERO     Full                                                        +---------+---------------+---------+-----------+----------+--------------+   +---------+---------------+---------+-----------+----------+--------------+ LEFT     CompressibilityPhasicitySpontaneityPropertiesThrombus Aging +---------+---------------+---------+-----------+----------+--------------+ CFV      None           No       No                   Acute          +---------+---------------+---------+-----------+----------+--------------+ SFJ      None                                         Acute          +---------+---------------+---------+-----------+----------+--------------+ FV Prox  None           No       No                   Acute          +---------+---------------+---------+-----------+----------+--------------+ FV Mid   None           No       No                   Acute          +---------+---------------+---------+-----------+----------+--------------+ FV DistalNone           No       No                   Acute          +---------+---------------+---------+-----------+----------+--------------+ POP      Partial        No       No                   Acute          +---------+---------------+---------+-----------+----------+--------------+ PTV      Full                                                        +---------+---------------+---------+-----------+----------+--------------+  PERO     None                                         Acute          +---------+---------------+---------+-----------+----------+--------------+ EIV      None           No       No                   Acute          +---------+---------------+---------+-----------+----------+--------------+ Unable to visualize CIV, or IVC due to bowel gas.    Summary: Right: There is no evidence of deep vein thrombosis in the  lower extremity. No cystic structure found in the popliteal fossa. Left: Findings consistent with acute deep vein thrombosis involving the left external iliac vein, common femoral vein, left femoral vein, left popliteal vein, and left peroneal veins. No cystic structure found in the popliteal fossa. Unable to visualize CIV, or IVC due to bowel gas.  *See table(s) above for measurements and observations. Electronically signed by Deitra Mayo MD on 04/11/2019 at 4:09:39 PM.    Final         Scheduled Meds: . fentaNYL      . flumazenil      . midazolam      . naloxone      . sodium chloride flush  3 mL Intravenous Q12H   Continuous Infusions: . sodium chloride    . heparin Stopped (04/12/19 0802)     LOS: 1 day    Time spent: 30 minutes    Barb Merino, MD [facility] Pager [phone]

## 2019-04-12 NOTE — Progress Notes (Addendum)
ANTICOAGULATION CONSULT NOTE  Pharmacy Consult for Heparin Indication: DVT  No Known Allergies  Patient Measurements: Height: 6\' 3"  (190.5 cm) Weight: 200 lb (90.7 kg) IBW/kg (Calculated) : 84.5  Vital Signs: Temp: 98.4 F (36.9 C) (01/27 0506) Temp Source: Oral (01/27 0506) BP: 121/78 (01/27 0506) Pulse Rate: 85 (01/27 0506)  Labs: Recent Labs    04/10/19 2320 04/10/19 2320 04/11/19 0432 04/11/19 0812 04/11/19 1258 04/12/19 0318  HGB 13.3   < > 13.5  --   --  12.8*  HCT 42.9  --  42.6  --   --  40.2  PLT 183  --  177  --   --  204  LABPROT  --   --   --   --  13.3  --   INR  --   --   --   --  1.0  --   HEPARINUNFRC  --   --   --  0.59  --  0.60  CREATININE 0.99  --  0.86  --   --   --   TROPONINIHS 2  --   --   --   --   --    < > = values in this interval not displayed.    Estimated Creatinine Clearance: 141.9 mL/min (by C-G formula based on SCr of 0.86 mg/dL).  Medications:  Infusions:  . sodium chloride    . heparin Stopped (04/12/19 0802)    Assessment: 39 yo M with hx testicular CA and recent travel from [place] presents with left leg pain & swelling. Ultrasound + L DVT in femoral vein.   Baseline INR, aPTT: not done  Prior anticoagulation: none  Significant events:  Today, 04/12/2019:  CBC: Hgb slightly lower but otherwise stable  AM heparin level therapeutic and stable on 1700 units/hr  Heparin turned off at 0800 this AM for lymph node Bx  Treatment team still deciding on catheter-directed thrombolysis  No bleeding or infusion issues per nursing  Goal of Therapy: Heparin level 0.3-0.7 units/ml Monitor platelets by anticoagulation protocol: Yes  Plan:  F/u timing of heparin restart after biopsy - would resume infusion at 1700 units/hr  Daily CBC and heparin level  Monitor for signs of bleeding or thrombosis  F/u plans for any lytic therapy and eventual PO anticoagulation  Reuel Boom, PharmD, BCPS [phone] 04/12/2019, 11:01 AM    ADDENDUM  S/p successful lymph node Bx  Per IR, OK to resume heparin right away  Communicated with RN to resume heparin at 1700 units/hr once patient back on floor  No decision on thrombolysis yet  Reuel Boom, PharmD, BCPS [phone] 04/12/2019, 12:35 PM

## 2019-04-12 NOTE — Progress Notes (Signed)
L thigh circumference 58.5cm. Dorsalis pedis pulse on L foot palpated +1 and auscultated via doppler. Will continue to monitor.

## 2019-04-12 NOTE — Progress Notes (Signed)
Left thigh circumference 59 cm. Pulse palpate on left foot.

## 2019-04-12 NOTE — Procedures (Signed)
Interventional Radiology Procedure Note  Procedure: CT guided retroperitoneal lymph node biopsy. Mx 18g core biopsy.   Hx of treated testicular cancer.   Complications: None  Recommendations:  - ok to restart heparin now - Ok to shower tomorrow - Do not submerge - Routine care   Signed,  Dulcy Fanny. Earleen Newport, DO

## 2019-04-12 NOTE — Progress Notes (Signed)
Left thigh circumference 60.5 cm. Pulse palpated on left foot.

## 2019-04-12 NOTE — Progress Notes (Signed)
Left thigh circumference 62cm. Pulse palpated on left foot +1. Heparin gtt resumed. VSS.

## 2019-04-13 ENCOUNTER — Inpatient Hospital Stay (HOSPITAL_COMMUNITY): Payer: BLUE CROSS/BLUE SHIELD

## 2019-04-13 DIAGNOSIS — C6290 Malignant neoplasm of unspecified testis, unspecified whether descended or undescended: Secondary | ICD-10-CM

## 2019-04-13 LAB — CBC
HCT: 43.3 % (ref 39.0–52.0)
Hemoglobin: 13.7 g/dL (ref 13.0–17.0)
MCH: 29.8 pg (ref 26.0–34.0)
MCHC: 31.6 g/dL (ref 30.0–36.0)
MCV: 94.3 fL (ref 80.0–100.0)
Platelets: 211 10*3/uL (ref 150–400)
RBC: 4.59 MIL/uL (ref 4.22–5.81)
RDW: 13.6 % (ref 11.5–15.5)
WBC: 6.9 10*3/uL (ref 4.0–10.5)
nRBC: 0 % (ref 0.0–0.2)

## 2019-04-13 LAB — HEPARIN LEVEL (UNFRACTIONATED): Heparin Unfractionated: 0.3 IU/mL (ref 0.30–0.70)

## 2019-04-13 NOTE — Progress Notes (Signed)
Leg circumference was 64cm and left pedal pulses were heard on doppler. No complains of pain. Will continue to monitor.

## 2019-04-13 NOTE — Progress Notes (Signed)
Leg circumference was 64cm and pedal pulses were heard on doppler. Will continue to monitor.

## 2019-04-13 NOTE — Progress Notes (Signed)
PROGRESS NOTE    Elan Ohora  [id number] DOB: May 02, 1980 DOA: 04/10/2019 PCP: Patient, No Pcp Per    Brief Narrative:  39 year old gentleman with history of testicular cancer stage Ib, left orchiectomy in 2012 at [facility], last surveillance in 2014 at [facility] travel to [place] about a week ago to see his mother and he started having gradual worsening swelling of the left leg and some back pain.  Came to the emergency room with increased swelling, he was found to have extensive left lower extremity DVTs in all veins and intra-abdominal and retroperitoneal lymphadenopathy consistent with metastatic cancer.   Assessment & Plan:   Principal Problem:   DVT (deep venous thrombosis) (HCC) Active Problems:   H/O testicular cancer   Lymphadenopathy, retroperitoneal  Acute DVT, multiple veins left lower extremity: With significant symptoms and swelling. Remains on heparin, will continue until some clinical improvement. Continue to monitor, currently no evidence of compartment syndrome or hemodynamic compromise. Interventional radiology also following for thrombolysis if needed. MR today. On discharge, may need Lovenox subcu or Xarelto.  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 Underwent CT-guided biopsy 1/27. Results are pending. Followed by oncology.  Once patient is stable and diagnosis established, he will probably like to go back to [facility] for treatment. Oncology will co ordinate for follow up.   DVT prophylaxis: Heparin infusion Code Status: Full code Family Communication: None Disposition Plan: patient is from home. Anticipated DC to home, Barriers to discharge on heparin infusion and biopsy results are pending.  Consultants:   Oncology  IR  Procedures:   CT-guided biopsy, planned for today  Antimicrobials:   None   Subjective: Seen and examined.  No overnight events.  He thinks his leg swelling is slightly better.   His knee looks less swollen today.  Used 1 dose of Dilaudid yesterday for pain.  Objective: Vitals:   04/12/19 1230 04/12/19 1311 04/12/19 2058 04/13/19 0509  BP: 133/85 121/78 112/67 108/76  Pulse: 100 94 (!) 102 87  Resp: 16 14 20 18   Temp:  99.6 F (37.6 C) (!) 100.4 F (38 C) 98.4 F (36.9 C)  TempSrc:  Oral    SpO2: 98% 96% 99% 100%  Weight:      Height:        Intake/Output Summary (Last 24 hours) at 04/13/2019 1118 Last data filed at 04/13/2019 1058 Gross per 24 hour  Intake 561.76 ml  Output 1600 ml  Net -1038.24 ml   Filed Weights   04/10/19 2005  Weight: 90.7 kg    Examination:  General exam: Appears calm and comfortable, on room air. Respiratory system: Clear to auscultation. Respiratory effort normal.  No added sounds. Cardiovascular system: S1 & S2 heard, RRR. No JVD, murmurs, rubs, gallops or clicks.  Gastrointestinal system: Abdomen is nondistended, soft and nontender. No organomegaly or masses felt. Normal bowel sounds heard. Central nervous system: Alert and oriented. No focal neurological deficits. Extremities: Symmetric 5 x 5 power. Skin: No rashes, lesions or ulcers Psychiatry: Judgement and insight appear normal. Mood & affect appropriate.   Left leg is diffusely swollen from groin to foot.  Circumference remains stable.  Distal neurovascular status intact.     Data Reviewed: I have personally reviewed following labs and imaging studies  CBC: Recent Labs  Lab 04/10/19 2320 04/11/19 0432 04/12/19 0318 04/13/19 0423  WBC 6.1 6.1 7.3 6.9  HGB 13.3 13.5 12.8* 13.7  HCT 42.9 42.6 40.2 43.3  MCV 94.5 94.2 92.6 94.3  PLT 183 177 204 [id number]   Basic Metabolic Panel: Recent Labs  Lab 04/10/19 2320 04/11/19 0432  NA 140 138  K 4.3 4.1  CL 106 105  CO2 26 26  GLUCOSE 97 97  BUN 12 10  CREATININE 0.99 0.86  CALCIUM 9.0 8.8*   GFR: Estimated Creatinine Clearance: 141.9 mL/min (by C-G formula based on SCr of 0.86 mg/dL). Liver Function  Tests: Recent Labs  Lab 04/11/19 0432  AST 17  ALT 16  ALKPHOS 59  BILITOT 0.8  PROT 7.8  ALBUMIN 3.9   No results for input(s): LIPASE, AMYLASE in the last 168 hours. No results for input(s): AMMONIA in the last 168 hours. Coagulation Profile: Recent Labs  Lab 04/11/19 1258  INR 1.0   Cardiac Enzymes: No results for input(s): CKTOTAL, CKMB, CKMBINDEX, TROPONINI in the last 168 hours. BNP (last 3 results) No results for input(s): PROBNP in the last 8760 hours. HbA1C: No results for input(s): HGBA1C in the last 72 hours. CBG: No results for input(s): GLUCAP in the last 168 hours. Lipid Profile: No results for input(s): CHOL, HDL, LDLCALC, TRIG, CHOLHDL, LDLDIRECT in the last 72 hours. Thyroid Function Tests: No results for input(s): TSH, T4TOTAL, FREET4, T3FREE, THYROIDAB in the last 72 hours. Anemia Panel: No results for input(s): VITAMINB12, FOLATE, FERRITIN, TIBC, IRON, RETICCTPCT in the last 72 hours. Sepsis Labs: No results for input(s): PROCALCITON, LATICACIDVEN in the last 168 hours.  Recent Results (from the past 240 hour(s))  SARS CORONAVIRUS 2 (TAT 6-24 HRS) Nasopharyngeal Nasopharyngeal Swab     Status: None   Collection Time: 04/11/19 12:32 AM   Specimen: Nasopharyngeal Swab  Result Value Ref Range Status   SARS Coronavirus 2 NEGATIVE NEGATIVE Final    Comment: (NOTE) SARS-CoV-2 target nucleic acids are NOT DETECTED. The SARS-CoV-2 RNA is generally detectable in upper and lower respiratory specimens during the acute phase of infection. Negative results do not preclude SARS-CoV-2 infection, do not rule out co-infections with other pathogens, and should not be used as the sole basis for treatment or other patient management decisions. Negative results must be combined with clinical observations, patient history, and epidemiological information. The expected result is Negative. Fact Sheet for Patients: SugarRoll.be Fact Sheet  for Healthcare Providers: https://www.woods-mathews.com/ This test is not yet approved or cleared by the [place] FDA and  has been authorized for detection and/or diagnosis of SARS-CoV-2 by FDA under an Emergency Use Authorization (EUA). This EUA will remain  in effect (meaning this test can be used) for the duration of the COVID-19 declaration under Section 56 4(b)(1) of the Act, 21 U.S.C. section 360bbb-3(b)(1), unless the authorization is terminated or revoked sooner. Performed at [facility], [place] [address], [place], [place] [address]          Radiology Studies: CT BIOPSY  Result Date: 2019-04-16 INDICATION: 39 year old male with a history of testicular cancer previously treated. He presents with new retroperitoneal adenopathy EXAM: CT BIOPSY MEDICATIONS: None. ANESTHESIA/SEDATION: Moderate (conscious) sedation was employed during this procedure. A total of Versed 2.0 mg and Fentanyl 100 mcg was administered intravenously. Moderate Sedation Time: 14 minutes. The patient's level of consciousness and vital signs were monitored continuously by radiology nursing throughout the procedure under my direct supervision. FLUOROSCOPY TIME:  CT COMPLICATIONS: None PROCEDURE: Informed written consent was obtained from the patient after a thorough discussion of the procedural risks, benefits and alternatives. All questions were addressed. Maximal Sterile Barrier Technique was utilized including caps, mask, sterile gowns, sterile  gloves, sterile drape, hand hygiene and skin antiseptic. A timeout was performed prior to the initiation of the procedure. Patient positioned prone position on CT gantry table. Scout CT acquired for planning purposes. The patient was then prepped and draped in the usual sterile fashion. 1% lidocaine was used for local anesthesia. Introducer needle was then placed targeting the left-sided retroperitoneal lymphadenopathy. Once we confirmed needle tip  position, multiple 18 gauge core biopsy were acquired. Specimen placed in the saline as S fresh specimen. Needle was removed and a final CT was acquired. Patient tolerated the procedure well and remained hemodynamically stable throughout. No complications were encountered and no significant blood loss. IMPRESSION: Status post CT-guided biopsy of left retroperitoneal lymphadenopathy. Signed, Dulcy Fanny. Dellia Nims, RPVI [facility] Electronically Signed   By: [name] D.O.   On: 04/12/2019 12:56        Scheduled Meds: . sodium chloride flush  3 mL Intravenous Q12H   Continuous Infusions: . sodium chloride    . heparin 1,800 Units/hr (04/13/19 0930)     LOS: 2 days    Time spent: 30 minutes    Barb Merino, MD [facility] Pager [phone]

## 2019-04-13 NOTE — Progress Notes (Signed)
Leg circumference was 62.2cm and left pedal pulses were heard on doppler. Minimal complaints of pain at this time. Will continue to monitor.

## 2019-04-13 NOTE — Progress Notes (Signed)
Referring Physician(s): Truitt Merle  Supervising Physician: Jacqulynn Cadet  Patient Status:  [name] - In-pt  Chief Complaint: "Leg pain"  Subjective:  LLE DVT (involving the lower IVC, bilateral common iliac veins, left external iliac vein, and proximal left femoral system). Patient awake and alert sitting in bed. States that his LLE swelling has improved (states his knee is not as swollen). Still complaints of LLE pain, stable at this time.  MR/MRV abdomen 04/13/2019: 1. 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 2. Pathologic lymphadenopathy of the retroperitoneum, similar to comparison CT, contributing to external compression of the infrarenal IVC. The current MR is nondiagnostic to confirm or rule out tumor thrombus. 3. Lower abdominal/pelvic wall edema including edema of the proximal left lower extremity.   Allergies: Patient has no known allergies.  Medications: Prior to Admission medications   Medication Sig Start Date End Date Taking? Authorizing Provider  cyclobenzaprine (FLEXERIL) 10 MG tablet Take 1 tablet (10 mg total) by mouth 3 (three) times daily as needed for muscle spasms. 04/04/19  Yes Lamptey, Myrene Galas, MD  ibuprofen (ADVIL) 600 MG tablet Take 1 tablet (600 mg total) by mouth every 6 (six) hours as needed. 04/04/19  Yes Lamptey, Myrene Galas, MD     Vital Signs: BP 125/78 (BP Location: Left Arm)   Pulse (!) 104   Temp 99.7 F (37.6 C) (Oral)   Resp 20   Ht 6\' 3"  (1.905 m)   Wt 200 lb (90.7 kg)   SpO2 100%   BMI 25.00 kg/m   Physical Exam Vitals and nursing note reviewed.  Constitutional:      General: He is not in acute distress.    Appearance: Normal appearance.  Pulmonary:     Effort: Pulmonary effort is normal. No respiratory distress.  Musculoskeletal:     Comments: Significant LLE edema noted (greatest at thigh) with associated  erythema and tenderness, skin warm and tight. Distal pulses palpable bilaterally with Doppler.  Skin:    General: Skin is warm and dry.  Neurological:     Mental Status: He is alert and oriented to person, place, and time.  Psychiatric:        Mood and Affect: Mood normal.        Behavior: Behavior normal.     Imaging: CT Angio Chest PE W and/or Wo Contrast  Result Date: 04/11/2019 CLINICAL DATA:  Positive lower extremity DVT, PE suspected. Back pain and leg swelling, history of testicular cancer. EXAM: CT ANGIOGRAPHY CHEST CT ABDOMEN AND PELVIS WITH CONTRAST TECHNIQUE: Multidetector CT imaging of the chest was performed using the standard protocol during bolus administration of intravenous contrast. Multiplanar CT image reconstructions and MIPs were obtained to evaluate the vascular anatomy. Multidetector CT imaging of the abdomen and pelvis was performed using the standard protocol during bolus administration of intravenous contrast. CONTRAST:  159mL OMNIPAQUE IOHEXOL 350 MG/ML SOLN COMPARISON:  None. FINDINGS: CTA CHEST FINDINGS Cardiovascular: Suboptimal opacification of the pulmonary arteries limits evaluation beyond the lobar level. No large central or lobar pulmonary arterial filling defects are identified. Central pulmonary arteries are normal caliber. Normal cardiac size. 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 The aorta is normal caliber. Shared origin of the brachiocephalic and left common carotid arteries. Great vessels are otherwise unremarkable. Mediastinum/Nodes: No enlarged mediastinal, hilar  or axillary adenopathy is seen. No acute abnormality of the trachea or esophagus. Thyroid gland is normal. Asymmetric thickening of the left sternocleidomastoid is likely related to muscle activation. Lungs/Pleura: Respiratory motion artifact is mild. No consolidation, features of edema, pneumothorax, or effusion. No suspicious  pulmonary nodules or masses. Musculoskeletal: No acute osseous abnormality or suspicious osseous lesion in the imaged chest. No worrisome chest wall lesions. Mild bilateral gynecomastia is noted, right slightly greater left. Review of the MIP images confirms the above findings. CT ABDOMEN and PELVIS FINDINGS Hepatobiliary: No focal liver abnormality is seen. No gallstones, gallbladder wall thickening, or biliary dilatation. Pancreas: Unremarkable. No pancreatic ductal dilatation or surrounding inflammatory changes. Spleen: Normal in size without focal abnormality. Adrenals/Urinary Tract: Adrenal glands are unremarkable. Kidneys are normal, without renal calculi, focal lesion, or hydronephrosis. Bladder is unremarkable. Stomach/Bowel: Distal esophagus, stomach and duodenal sweep are unremarkable. No small bowel wall thickening or dilatation. No evidence of obstruction. A normal appendix is visualized. No colonic dilatation or wall thickening. Scattered colonic diverticula without focal pericolonic inflammation to suggest diverticulitis. Vascular/Lymphatic: There is extensive retroperitoneal and pelvic adenopathy, some of the larger nodes include a left periaortic node measuring up to 3.2 cm (2/43) and a 2 cm aortocaval lymph node (2/35) there is distal abdominal aortic encasement by a extensive retroperitoneal adenopathy. Furthermore there is likely direct compression and/or direct nodal involvement of the inferior vena cava (2/40 with the lower IVC appearing expanded by hypoattenuating filling defects. Edematous changes are noted throughout the pelvis with venous engorgement and collateralization. Reproductive: The prostate and seminal vesicles are unremarkable. There is a varicocele seen in the left scrotum. External genitalia are incompletely included on this exam. Other: There is circumferential body wall edema from the level of the pelvis inferiorly more pronounced throughout the left lower extremity. Free fluid  is seen in the deep pelvis, likely reactive or secondary to venous occlusion with developing collateralization. Musculoskeletal: No acute osseous abnormality or suspicious osseous lesion. Normal symmetric muscular enhancement. Review of the MIP images confirms the above findings. IMPRESSION: 1. Suboptimal opacification of the pulmonary arteries limits evaluation beyond the lobar level. No large central or lobar pulmonary arterial filling defects are identified. 2. Extensive retroperitoneal and pelvic adenopathy. There is likely direct compression of the lower IVC with the distal IVC and iliac veins appearing expanded by hypoattenuating thrombus. There is complete encasement of the abdominal aorta as well but without luminal narrowing. Findings are highly concerning for metastatic disease, particularly given patient history of testicular cancer. 3. Left scrotal varicocele. External genitalia are incompletely included on this exam. Consider further evaluation scrotal ultrasound given extensive retroperitoneal adenopathy and patient's history of testicular cancer. 4. Free fluid in the deep pelvis is likely reactive or secondary to venous occlusion with developing collateralization. 5. Colonic diverticulosis without evidence of diverticulitis. 6. Mild bilateral gynecomastia, right slightly greater left. 7. 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 Critical Value/emergent results were called by telephone at the time of interpretation on 04/11/2019 at 1:33 am to [place] , who verbally acknowledged these results. Electronically Signed   By: Lovena Le M.D.   On: 04/11/2019 01:33   US Scrotum  Result Date: 04/11/2019 CLINICAL DATA:  Concern for testicular cancer EXAM: ULTRASOUND OF SCROTUM TECHNIQUE: Complete ultrasound examination of the testicles, epididymis, and other scrotal structures was performed. COMPARISON:  None. FINDINGS: Right  testicle Measurements: Surgically removed. Left testicle Measurements:  3.9 x 2.2 x 2.6 cm. No mass or microlithiasis visualized. Right epididymis:  Not visualized Left epididymis:  Normal in size and appearance. Hydrocele:  Moderate left hydrocele Varicocele:  Moderate left varicocele. IMPRESSION: No left testicular abnormality.  Prior right orchiectomy. Moderate left hydrocele and varicocele. Electronically Signed   By: Rolm Baptise M.D.   On: 04/11/2019 02:16   CT ABDOMEN PELVIS W CONTRAST  Result Date: 04/11/2019 CLINICAL DATA:  Positive lower extremity DVT, PE suspected. Back pain and leg swelling, history of testicular cancer. EXAM: CT ANGIOGRAPHY CHEST CT ABDOMEN AND PELVIS WITH CONTRAST TECHNIQUE: Multidetector CT imaging of the chest was performed using the standard protocol during bolus administration of intravenous contrast. Multiplanar CT image reconstructions and MIPs were obtained to evaluate the vascular anatomy. Multidetector CT imaging of the abdomen and pelvis was performed using the standard protocol during bolus administration of intravenous contrast. CONTRAST:  158mL OMNIPAQUE IOHEXOL 350 MG/ML SOLN COMPARISON:  None. FINDINGS: CTA CHEST FINDINGS Cardiovascular: Suboptimal opacification of the pulmonary arteries limits evaluation beyond the lobar level. No large central or lobar pulmonary arterial filling defects are identified. Central pulmonary arteries are normal caliber. Normal cardiac size. 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 The aorta is normal caliber. Shared origin of the brachiocephalic and left common carotid arteries. Great vessels are otherwise unremarkable. Mediastinum/Nodes: No enlarged mediastinal, hilar or axillary adenopathy is seen. No acute abnormality of the trachea or esophagus. Thyroid gland is normal. Asymmetric thickening of the left sternocleidomastoid is likely related to muscle activation.  Lungs/Pleura: Respiratory motion artifact is mild. No consolidation, features of edema, pneumothorax, or effusion. No suspicious pulmonary nodules or masses. Musculoskeletal: No acute osseous abnormality or suspicious osseous lesion in the imaged chest. No worrisome chest wall lesions. Mild bilateral gynecomastia is noted, right slightly greater left. Review of the MIP images confirms the above findings. CT ABDOMEN and PELVIS FINDINGS Hepatobiliary: No focal liver abnormality is seen. No gallstones, gallbladder wall thickening, or biliary dilatation. Pancreas: Unremarkable. No pancreatic ductal dilatation or surrounding inflammatory changes. Spleen: Normal in size without focal abnormality. Adrenals/Urinary Tract: Adrenal glands are unremarkable. Kidneys are normal, without renal calculi, focal lesion, or hydronephrosis. Bladder is unremarkable. Stomach/Bowel: Distal esophagus, stomach and duodenal sweep are unremarkable. No small bowel wall thickening or dilatation. No evidence of obstruction. A normal appendix is visualized. No colonic dilatation or wall thickening. Scattered colonic diverticula without focal pericolonic inflammation to suggest diverticulitis. Vascular/Lymphatic: There is extensive retroperitoneal and pelvic adenopathy, some of the larger nodes include a left periaortic node measuring up to 3.2 cm (2/43) and a 2 cm aortocaval lymph node (2/35) there is distal abdominal aortic encasement by a extensive retroperitoneal adenopathy. Furthermore there is likely direct compression and/or direct nodal involvement of the inferior vena cava (2/40 with the lower IVC appearing expanded by hypoattenuating filling defects. Edematous changes are noted throughout the pelvis with venous engorgement and collateralization. Reproductive: The prostate and seminal vesicles are unremarkable. There is a varicocele seen in the left scrotum. External genitalia are incompletely included on this exam. Other: There is  circumferential body wall edema from the level of the pelvis inferiorly more pronounced throughout the left lower extremity. Free fluid is seen in the deep pelvis, likely reactive or secondary to venous occlusion with developing collateralization. Musculoskeletal: No acute osseous abnormality or suspicious osseous lesion. Normal symmetric muscular enhancement. Review of the MIP images confirms the above findings. IMPRESSION:  1. Suboptimal opacification of the pulmonary arteries limits evaluation beyond the lobar level. No large central or lobar pulmonary arterial filling defects are identified. 2. Extensive retroperitoneal and pelvic adenopathy. There is likely direct compression of the lower IVC with the distal IVC and iliac veins appearing expanded by hypoattenuating thrombus. There is complete encasement of the abdominal aorta as well but without luminal narrowing. Findings are highly concerning for metastatic disease, particularly given patient history of testicular cancer. 3. Left scrotal varicocele. External genitalia are incompletely included on this exam. Consider further evaluation scrotal ultrasound given extensive retroperitoneal adenopathy and patient's history of testicular cancer. 4. Free fluid in the deep pelvis is likely reactive or secondary to venous occlusion with developing collateralization. 5. Colonic diverticulosis without evidence of diverticulitis. 6. Mild bilateral gynecomastia, right slightly greater left. 7. 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 Critical Value/emergent results were called by telephone at the time of interpretation on 04/11/2019 at 1:33 am to [place] , who verbally acknowledged these results. Electronically Signed   By: Lovena Le M.D.   On: 04/11/2019 01:33   CT BIOPSY  Result Date: 04/12/2019 INDICATION: 39 year old male with a history of testicular cancer previously treated. He  presents with new retroperitoneal adenopathy EXAM: CT BIOPSY MEDICATIONS: None. ANESTHESIA/SEDATION: Moderate (conscious) sedation was employed during this procedure. A total of Versed 2.0 mg and Fentanyl 100 mcg was administered intravenously. Moderate Sedation Time: 14 minutes. The patient's level of consciousness and vital signs were monitored continuously by radiology nursing throughout the procedure under my direct supervision. FLUOROSCOPY TIME:  CT COMPLICATIONS: None PROCEDURE: Informed written consent was obtained from the patient after a thorough discussion of the procedural risks, benefits and alternatives. All questions were addressed. Maximal Sterile Barrier Technique was utilized including caps, mask, sterile gowns, sterile gloves, sterile drape, hand hygiene and skin antiseptic. A timeout was performed prior to the initiation of the procedure. Patient positioned prone position on CT gantry table. Scout CT acquired for planning purposes. The patient was then prepped and draped in the usual sterile fashion. 1% lidocaine was used for local anesthesia. Introducer needle was then placed targeting the left-sided retroperitoneal lymphadenopathy. Once we confirmed needle tip position, multiple 18 gauge core biopsy were acquired. Specimen placed in the saline as S fresh specimen. Needle was removed and a final CT was acquired. Patient tolerated the procedure well and remained hemodynamically stable throughout. No complications were encountered and no significant blood loss. IMPRESSION: Status post CT-guided biopsy of left retroperitoneal lymphadenopathy. Signed, Dulcy Fanny. Dellia Nims, RPVI [facility] Radiology Electronically Signed   By: [name] D.O.   On: 04/12/2019 12:56   MR MRV ABDOMEN W WO CONTRAST  Result Date: 04/13/2019 CLINICAL DATA:  39 year old male with retroperitoneal lymphadenopathy, history of testicular cancer previously treated, now with  ilio caval DVT on prior CT, and left lower extremity DVT on duplex. EXAM: MRI ABDOMEN WITHOUT AND WITH CONTRAST TECHNIQUE: Multiplanar multisequence MR imaging of the abdomen was performed both before and after the administration of intravenous contrast. CONTRAST:  22mL GADAVIST GADOBUTROL 1 MMOL/ML IV SOLN COMPARISON:  CT 04/11/2019 FINDINGS: Vascular: Arterial: Unremarkable course caliber and contour of the visualized abdominal aorta. Unremarkable course caliber and contour of the bilateral iliac arteries and proximal femoral arteries. Bilateral hypogastric arteries are patent. Venous: Right iliac/femoral system: Flow signal is maintained within the visualized proximal femoral veins. Flow signal  maintained into the external iliac vein. Absent flow signal with evidence of thrombus involving the proximal right common iliac vein to the confluence and into the IVC. Expansion of the right common iliac vein. Multiple pelvic collateral vessels. Left iliac/femoral system: Absent flow signal through the length of the left iliac venous system extending through the femoral vein. Expansion of the left iliac and femoral venous system. Multiple pelvic collateral veins. IVC: The IVC is not well evaluated at the level of the adenopathy. Absent flow signal above the confluence of the iliac veins compatible with ilio caval thrombus. The juxtarenal IVC is patent with maintained flow signal and maintain flow signal of the bilateral renal veins. Nonvascular: Unremarkable musculoskeletal structures. Partially distended urinary bladder. Unremarkable appearance of the visualized kidneys and GI system. Edema of the lower abdominal wall/pelvic body wall, as well as edema of the left proximal lower extremity. 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 Pathologic lymphadenopathy of the retroperitoneum, similar to  comparison CT, contributing to external compression of the infrarenal IVC. The current MR is nondiagnostic to confirm or rule out tumor thrombus. Lower abdominal/pelvic wall edema including edema of the proximal left lower extremity. Signed, Dulcy Fanny. Dellia Nims, RPVI [facility] Radiology Electronically Signed   By: [name] D.O.   On: 04/13/2019 14:14   VAS [place] LOWER EXTREMITY VENOUS (DVT)  Result Date: 04/11/2019  Lower Venous Study Indications: Swelling.  Risk Factors: None identified. Anticoagulation: Heparin. Limitations: Poor ultrasound/tissue interface and bowel gas. Comparison Study: No prior studies. Performing Technologist: Oliver Hum RVT  Examination Guidelines: A complete evaluation includes B-mode imaging, spectral Doppler, color Doppler, and power Doppler as needed of all accessible portions of each vessel. Bilateral testing is considered an integral part of a complete examination. Limited examinations for reoccurring indications may be performed as noted.  +---------+---------------+---------+-----------+----------+--------------+ RIGHT    CompressibilityPhasicitySpontaneityPropertiesThrombus Aging +---------+---------------+---------+-----------+----------+--------------+ CFV      Full           Yes      Yes                                 +---------+---------------+---------+-----------+----------+--------------+ SFJ      Full                                                        +---------+---------------+---------+-----------+----------+--------------+ FV Prox  Full                                                        +---------+---------------+---------+-----------+----------+--------------+ FV Mid   Full                                                        +---------+---------------+---------+-----------+----------+--------------+ FV DistalFull                                                         +---------+---------------+---------+-----------+----------+--------------+  PFV      Full                                                        +---------+---------------+---------+-----------+----------+--------------+ POP      Full           Yes      Yes                                 +---------+---------------+---------+-----------+----------+--------------+ PTV      Full                                                        +---------+---------------+---------+-----------+----------+--------------+ PERO     Full                                                        +---------+---------------+---------+-----------+----------+--------------+   +---------+---------------+---------+-----------+----------+--------------+ LEFT     CompressibilityPhasicitySpontaneityPropertiesThrombus Aging +---------+---------------+---------+-----------+----------+--------------+ CFV      None           No       No                   Acute          +---------+---------------+---------+-----------+----------+--------------+ SFJ      None                                         Acute          +---------+---------------+---------+-----------+----------+--------------+ FV Prox  None           No       No                   Acute          +---------+---------------+---------+-----------+----------+--------------+ FV Mid   None           No       No                   Acute          +---------+---------------+---------+-----------+----------+--------------+ FV DistalNone           No       No                   Acute          +---------+---------------+---------+-----------+----------+--------------+ POP      Partial        No       No                   Acute          +---------+---------------+---------+-----------+----------+--------------+ PTV      Full                                                         +---------+---------------+---------+-----------+----------+--------------+  PERO     None                                         Acute          +---------+---------------+---------+-----------+----------+--------------+ EIV      None           No       No                   Acute          +---------+---------------+---------+-----------+----------+--------------+ Unable to visualize CIV, or IVC due to bowel gas.    Summary: Right: There is no evidence of deep vein thrombosis in the lower extremity. No cystic structure found in the popliteal fossa. Left: Findings consistent with acute deep vein thrombosis involving the left external iliac vein, common femoral vein, left femoral vein, left popliteal vein, and left peroneal veins. No cystic structure found in the popliteal fossa. Unable to visualize CIV, or IVC due to bowel gas.  *See table(s) above for measurements and observations. Electronically signed by Deitra Mayo MD on 04/11/2019 at 4:09:39 PM.    Final     Labs:  CBC: Recent Labs    04/10/19 2320 04/11/19 0432 04/12/19 0318 04/13/19 0423  WBC 6.1 6.1 7.3 6.9  HGB 13.3 13.5 12.8* 13.7  HCT 42.9 42.6 40.2 43.3  PLT 183 177 204 211    COAGS: Recent Labs    04/11/19 1258  INR 1.0    BMP: Recent Labs    04/10/19 2320 04/11/19 0432  NA 140 138  K 4.3 4.1  CL 106 105  CO2 26 26  GLUCOSE 97 97  BUN 12 10  CALCIUM 9.0 8.8*  CREATININE 0.99 0.86  GFRNONAA >60 >60  GFRAA >60 >60    LIVER FUNCTION TESTS: Recent Labs    04/11/19 0432  BILITOT 0.8  AST 17  ALT 16  ALKPHOS 59  PROT 7.8  ALBUMIN 3.9    Assessment and Plan:  LLE DVT (involving the lower IVC, bilateral common iliac veins, left external iliac vein, and proximal left femoral system). Discussed findings of MR/MRV abdomen with patient. Discussed treatment options, including thrombectomy and/or lysis with possible stent placement versus continuing with IV Heparin. Patient states he has had  some improvements on IV Heparin (LLE knee is not as swollen per patient). At this time, recommend continuing IV Heparin for additional day and re-evaluating tomorrow AM. If we proceed with procedure, [facility] will need to [facility] (he is aware and agreeable). Further recommendations per Dr. Laurence Ferrari. Further plans per TRH/oncology- appreciate and agree with management. IR to follow.   Electronically Signed: Earley Abide, PA-C 04/13/2019, 4:23 PM   I spent a total of 25 Minutes at the the patient's bedside AND on the patient's [name] floor or unit, greater than 50% of which was counseling/coordinating care for LLE DVT.

## 2019-04-13 NOTE — Progress Notes (Signed)
Leg circumference was 64.5cm and pedal pulses were heard on doppler. Will continue to monitor.  Marked thigh for future measurements.

## 2019-04-13 NOTE — Progress Notes (Addendum)
HEMATOLOGY-ONCOLOGY PROGRESS NOTE  SUBJECTIVE: Reports that he tolerated the biopsy well.  Reports left leg swelling is decreasing.  He has no other complaints this morning.  REVIEW OF SYSTEMS:   Constitutional: Denies fevers, chills or abnormal weight loss Eyes: Denies blurriness of vision Ears, nose, mouth, throat, and face: Denies mucositis or sore throat Respiratory: Denies cough, dyspnea or wheezes Cardiovascular: Denies palpitation, chest discomfort Gastrointestinal:  Denies nausea, heartburn or change in bowel habits Skin: Denies abnormal skin rashes Lymphatics: Denies new lymphadenopathy or easy bruising Neurological:Denies numbness, tingling or new weaknesses Behavioral/Psych: Mood is stable, no new changes  Extremities: Left lower extremity edema improving All other systems were reviewed with the patient and are negative.  I have reviewed the past medical history, past surgical history, social history and family history with the patient and they are unchanged from previous note.   PHYSICAL EXAMINATION:  Vitals:   04/12/19 2058 04/13/19 0509  BP: 112/67 108/76  Pulse: (!) 102 87  Resp: 20 18  Temp: (!) 100.4 F (38 C) 98.4 F (36.9 C)  SpO2: 99% 100%   Filed Weights   04/10/19 2005  Weight: 200 lb (90.7 kg)    Intake/Output from previous day: 01/27 0701 - 01/28 0700 In: 590.2 [P.O.:240; I.V.:350.2] Out: 700 [Urine:700]  GENERAL:alert, no distress and comfortable SKIN: skin color, texture, turgor are normal, no rashes or significant lesions EYES: normal, Conjunctiva are pink and non-injected, sclera clear OROPHARYNX:no exudate, no erythema and lips, buccal mucosa, and tongue normal  NECK: supple, thyroid normal size, non-tender, without nodularity LYMPH:  no palpable lymphadenopathy in the cervical, axillary or inguinal LUNGS: clear to auscultation and percussion with normal breathing effort HEART: regular rate & rhythm and no murmurs.  Significant left lower  extremity edema and redness, mildly improved ABDOMEN:abdomen soft, non-tender and normal bowel sounds Musculoskeletal:no cyanosis of digits and no clubbing  NEURO: alert & oriented x 3 with fluent speech, no focal motor/sensory deficits  LABORATORY DATA:  I have reviewed the data as listed CMP Latest Ref Rng & Units 04/11/2019 04/10/2019  Glucose 70 - 99 mg/dL 97 97  BUN 6 - 20 mg/dL 10 12  Creatinine 0.61 - 1.24 mg/dL 0.86 0.99  Sodium 135 - 145 mmol/L 138 140  Potassium 3.5 - 5.1 mmol/L 4.1 4.3  Chloride 98 - 111 mmol/L 105 106  CO2 22 - 32 mmol/L 26 26  Calcium 8.9 - 10.3 mg/dL 8.8(L) 9.0  Total Protein 6.5 - 8.1 g/dL 7.8 -  Total Bilirubin 0.3 - 1.2 mg/dL 0.8 -  Alkaline Phos 38 - 126 U/L 59 -  AST 15 - 41 U/L 17 -  ALT 0 - 44 U/L 16 -    Lab Results  Component Value Date   WBC 6.9 04/13/2019   HGB 13.7 04/13/2019   HCT 43.3 04/13/2019   MCV 94.3 04/13/2019   PLT 211 04/13/2019    CT Angio Chest PE W and/or Wo Contrast  Result Date: 04/11/2019 CLINICAL DATA:  Positive lower extremity DVT, PE suspected. Back pain and leg swelling, history of testicular cancer. EXAM: CT ANGIOGRAPHY CHEST CT ABDOMEN AND PELVIS WITH CONTRAST TECHNIQUE: Multidetector CT imaging of the chest was performed using the standard protocol during bolus administration of intravenous contrast. Multiplanar CT image reconstructions and MIPs were obtained to evaluate the vascular anatomy. Multidetector CT imaging of the abdomen and pelvis was performed using the standard protocol during bolus administration of intravenous contrast. CONTRAST:  11mL OMNIPAQUE IOHEXOL 350 MG/ML SOLN COMPARISON:  None. FINDINGS: CTA CHEST FINDINGS Cardiovascular: Suboptimal opacification of the pulmonary arteries limits evaluation beyond the lobar level. No large central or lobar pulmonary arterial filling defects are identified. Central pulmonary arteries are normal caliber. Normal cardiac size. Slight mass effect on the right heart  by a mild pectus deformity of the chest wall with a Haller index of 3.2. The aorta is normal caliber. Shared origin of the brachiocephalic and left common carotid arteries. Great vessels are otherwise unremarkable. Mediastinum/Nodes: No enlarged mediastinal, hilar or axillary adenopathy is seen. No acute abnormality of the trachea or esophagus. Thyroid gland is normal. Asymmetric thickening of the left sternocleidomastoid is likely related to muscle activation. Lungs/Pleura: Respiratory motion artifact is mild. No consolidation, features of edema, pneumothorax, or effusion. No suspicious pulmonary nodules or masses. Musculoskeletal: No acute osseous abnormality or suspicious osseous lesion in the imaged chest. No worrisome chest wall lesions. Mild bilateral gynecomastia is noted, right slightly greater left. Review of the MIP images confirms the above findings. CT ABDOMEN and PELVIS FINDINGS Hepatobiliary: No focal liver abnormality is seen. No gallstones, gallbladder wall thickening, or biliary dilatation. Pancreas: Unremarkable. No pancreatic ductal dilatation or surrounding inflammatory changes. Spleen: Normal in size without focal abnormality. Adrenals/Urinary Tract: Adrenal glands are unremarkable. Kidneys are normal, without renal calculi, focal lesion, or hydronephrosis. Bladder is unremarkable. Stomach/Bowel: Distal esophagus, stomach and duodenal sweep are unremarkable. No small bowel wall thickening or dilatation. No evidence of obstruction. A normal appendix is visualized. No colonic dilatation or wall thickening. Scattered colonic diverticula without focal pericolonic inflammation to suggest diverticulitis. Vascular/Lymphatic: There is extensive retroperitoneal and pelvic adenopathy, some of the larger nodes include a left periaortic node measuring up to 3.2 cm (2/43) and a 2 cm aortocaval lymph node (2/35) there is distal abdominal aortic encasement by a extensive retroperitoneal adenopathy. Furthermore  there is likely direct compression and/or direct nodal involvement of the inferior vena cava (2/40 with the lower IVC appearing expanded by hypoattenuating filling defects. Edematous changes are noted throughout the pelvis with venous engorgement and collateralization. Reproductive: The prostate and seminal vesicles are unremarkable. There is a varicocele seen in the left scrotum. External genitalia are incompletely included on this exam. Other: There is circumferential body wall edema from the level of the pelvis inferiorly more pronounced throughout the left lower extremity. Free fluid is seen in the deep pelvis, likely reactive or secondary to venous occlusion with developing collateralization. Musculoskeletal: No acute osseous abnormality or suspicious osseous lesion. Normal symmetric muscular enhancement. Review of the MIP images confirms the above findings. IMPRESSION: 1. Suboptimal opacification of the pulmonary arteries limits evaluation beyond the lobar level. No large central or lobar pulmonary arterial filling defects are identified. 2. Extensive retroperitoneal and pelvic adenopathy. There is likely direct compression of the lower IVC with the distal IVC and iliac veins appearing expanded by hypoattenuating thrombus. There is complete encasement of the abdominal aorta as well but without luminal narrowing. Findings are highly concerning for metastatic disease, particularly given patient history of testicular cancer. 3. Left scrotal varicocele. External genitalia are incompletely included on this exam. Consider further evaluation scrotal ultrasound given extensive retroperitoneal adenopathy and patient's history of testicular cancer. 4. Free fluid in the deep pelvis is likely reactive or secondary to venous occlusion with developing collateralization. 5. Colonic diverticulosis without evidence of diverticulitis. 6. Mild bilateral gynecomastia, right slightly greater left. 7. 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  Critical Value/emergent results were called by telephone at the time of interpretation on 04/11/2019 at 1:33 am to [place] , who verbally acknowledged these results. Electronically Signed   By: Lovena Le M.D.   On: 04/11/2019 01:33   US Scrotum  Result Date: 04/11/2019 CLINICAL DATA:  Concern for testicular cancer EXAM: ULTRASOUND OF SCROTUM TECHNIQUE: Complete ultrasound examination of the testicles, epididymis, and other scrotal structures was performed. COMPARISON:  None. FINDINGS: Right testicle Measurements: Surgically removed. Left testicle Measurements: 3.9 x 2.2 x 2.6 cm. No mass or microlithiasis visualized. Right epididymis:  Not visualized Left epididymis:  Normal in size and appearance. Hydrocele:  Moderate left hydrocele Varicocele:  Moderate left varicocele. IMPRESSION: No left testicular abnormality.  Prior right orchiectomy. Moderate left hydrocele and varicocele. Electronically Signed   By: Rolm Baptise M.D.   On: 04/11/2019 02:16   CT ABDOMEN PELVIS W CONTRAST  Result Date: 04/11/2019 CLINICAL DATA:  Positive lower extremity DVT, PE suspected. Back pain and leg swelling, history of testicular cancer. EXAM: CT ANGIOGRAPHY CHEST CT ABDOMEN AND PELVIS WITH CONTRAST TECHNIQUE: Multidetector CT imaging of the chest was performed using the standard protocol during bolus administration of intravenous contrast. Multiplanar CT image reconstructions and MIPs were obtained to evaluate the vascular anatomy. Multidetector CT imaging of the abdomen and pelvis was performed using the standard protocol during bolus administration of intravenous contrast. CONTRAST:  137mL OMNIPAQUE IOHEXOL 350 MG/ML SOLN COMPARISON:  None. FINDINGS: CTA CHEST FINDINGS Cardiovascular: Suboptimal opacification of the pulmonary arteries limits evaluation beyond the lobar level. No large central or lobar pulmonary arterial filling  defects are identified. Central pulmonary arteries are normal caliber. Normal cardiac size. 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 The aorta is normal caliber. Shared origin of the brachiocephalic and left common carotid arteries. Great vessels are otherwise unremarkable. Mediastinum/Nodes: No enlarged mediastinal, hilar or axillary adenopathy is seen. No acute abnormality of the trachea or esophagus. Thyroid gland is normal. Asymmetric thickening of the left sternocleidomastoid is likely related to muscle activation. Lungs/Pleura: Respiratory motion artifact is mild. No consolidation, features of edema, pneumothorax, or effusion. No suspicious pulmonary nodules or masses. Musculoskeletal: No acute osseous abnormality or suspicious osseous lesion in the imaged chest. No worrisome chest wall lesions. Mild bilateral gynecomastia is noted, right slightly greater left. Review of the MIP images confirms the above findings. CT ABDOMEN and PELVIS FINDINGS Hepatobiliary: No focal liver abnormality is seen. No gallstones, gallbladder wall thickening, or biliary dilatation. Pancreas: Unremarkable. No pancreatic ductal dilatation or surrounding inflammatory changes. Spleen: Normal in size without focal abnormality. Adrenals/Urinary Tract: Adrenal glands are unremarkable. Kidneys are normal, without renal calculi, focal lesion, or hydronephrosis. Bladder is unremarkable. Stomach/Bowel: Distal esophagus, stomach and duodenal sweep are unremarkable. No small bowel wall thickening or dilatation. No evidence of obstruction. A normal appendix is visualized. No colonic dilatation or wall thickening. Scattered colonic diverticula without focal pericolonic inflammation to suggest diverticulitis. Vascular/Lymphatic: There is extensive retroperitoneal and pelvic adenopathy, some of the larger nodes include a left periaortic node measuring up to 3.2 cm (2/43) and a 2  cm aortocaval lymph node (2/35) there is distal abdominal aortic encasement by a extensive retroperitoneal adenopathy. Furthermore there is likely direct compression and/or direct nodal involvement of the inferior vena cava (2/40 with the lower IVC appearing expanded by hypoattenuating filling defects. Edematous changes are noted throughout the pelvis with venous engorgement and collateralization. Reproductive: The  prostate and seminal vesicles are unremarkable. There is a varicocele seen in the left scrotum. External genitalia are incompletely included on this exam. Other: There is circumferential body wall edema from the level of the pelvis inferiorly more pronounced throughout the left lower extremity. Free fluid is seen in the deep pelvis, likely reactive or secondary to venous occlusion with developing collateralization. Musculoskeletal: No acute osseous abnormality or suspicious osseous lesion. Normal symmetric muscular enhancement. Review of the MIP images confirms the above findings. IMPRESSION: 1. Suboptimal opacification of the pulmonary arteries limits evaluation beyond the lobar level. No large central or lobar pulmonary arterial filling defects are identified. 2. Extensive retroperitoneal and pelvic adenopathy. There is likely direct compression of the lower IVC with the distal IVC and iliac veins appearing expanded by hypoattenuating thrombus. There is complete encasement of the abdominal aorta as well but without luminal narrowing. Findings are highly concerning for metastatic disease, particularly given patient history of testicular cancer. 3. Left scrotal varicocele. External genitalia are incompletely included on this exam. Consider further evaluation scrotal ultrasound given extensive retroperitoneal adenopathy and patient's history of testicular cancer. 4. Free fluid in the deep pelvis is likely reactive or secondary to venous occlusion with developing collateralization. 5. Colonic diverticulosis  without evidence of diverticulitis. 6. Mild bilateral gynecomastia, right slightly greater left. 7. 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 Critical Value/emergent results were called by telephone at the time of interpretation on 04/11/2019 at 1:33 am to [place] , who verbally acknowledged these results. Electronically Signed   By: Lovena Le M.D.   On: 04/11/2019 01:33   CT BIOPSY  Result Date: 04/12/2019 INDICATION: 39 year old male with a history of testicular cancer previously treated. He presents with new retroperitoneal adenopathy EXAM: CT BIOPSY MEDICATIONS: None. ANESTHESIA/SEDATION: Moderate (conscious) sedation was employed during this procedure. A total of Versed 2.0 mg and Fentanyl 100 mcg was administered intravenously. Moderate Sedation Time: 14 minutes. The patient's level of consciousness and vital signs were monitored continuously by radiology nursing throughout the procedure under my direct supervision. FLUOROSCOPY TIME:  CT COMPLICATIONS: None PROCEDURE: Informed written consent was obtained from the patient after a thorough discussion of the procedural risks, benefits and alternatives. All questions were addressed. Maximal Sterile Barrier Technique was utilized including caps, mask, sterile gowns, sterile gloves, sterile drape, hand hygiene and skin antiseptic. A timeout was performed prior to the initiation of the procedure. Patient positioned prone position on CT gantry table. Scout CT acquired for planning purposes. The patient was then prepped and draped in the usual sterile fashion. 1% lidocaine was used for local anesthesia. Introducer needle was then placed targeting the left-sided retroperitoneal lymphadenopathy. Once we confirmed needle tip position, multiple 18 gauge core biopsy were acquired. Specimen placed in the saline as S fresh specimen. Needle was removed and a final CT was acquired. Patient  tolerated the procedure well and remained hemodynamically stable throughout. No complications were encountered and no significant blood loss. IMPRESSION: Status post CT-guided biopsy of left retroperitoneal lymphadenopathy. Signed, Dulcy Fanny. Dellia Nims, RPVI [facility] Radiology Electronically Signed   By: [name] D.O.   On: 04/12/2019 12:56   VAS [place] LOWER EXTREMITY VENOUS (DVT)  Result Date: 04/11/2019  Lower Venous Study Indications: Swelling.  Risk Factors: None identified. Anticoagulation: Heparin. Limitations: Poor ultrasound/tissue interface and bowel gas. Comparison Study: No prior studies. Performing Technologist: Oliver Hum RVT  Examination Guidelines: A complete evaluation includes B-mode imaging, spectral Doppler, color Doppler, and power Doppler as needed of all accessible portions of each vessel. Bilateral testing is considered an integral part of a complete examination. Limited examinations for reoccurring indications may be performed as noted.  +---------+---------------+---------+-----------+----------+--------------+ RIGHT    CompressibilityPhasicitySpontaneityPropertiesThrombus Aging +---------+---------------+---------+-----------+----------+--------------+ CFV      Full           Yes      Yes                                 +---------+---------------+---------+-----------+----------+--------------+ SFJ      Full                                                        +---------+---------------+---------+-----------+----------+--------------+ FV Prox  Full                                                        +---------+---------------+---------+-----------+----------+--------------+ FV Mid   Full                                                        +---------+---------------+---------+-----------+----------+--------------+ FV DistalFull                                                         +---------+---------------+---------+-----------+----------+--------------+ PFV      Full                                                        +---------+---------------+---------+-----------+----------+--------------+ POP      Full           Yes      Yes                                 +---------+---------------+---------+-----------+----------+--------------+ PTV      Full                                                        +---------+---------------+---------+-----------+----------+--------------+ PERO     Full                                                        +---------+---------------+---------+-----------+----------+--------------+   +---------+---------------+---------+-----------+----------+--------------+  LEFT     CompressibilityPhasicitySpontaneityPropertiesThrombus Aging +---------+---------------+---------+-----------+----------+--------------+ CFV      None           No       No                   Acute          +---------+---------------+---------+-----------+----------+--------------+ SFJ      None                                         Acute          +---------+---------------+---------+-----------+----------+--------------+ FV Prox  None           No       No                   Acute          +---------+---------------+---------+-----------+----------+--------------+ FV Mid   None           No       No                   Acute          +---------+---------------+---------+-----------+----------+--------------+ FV DistalNone           No       No                   Acute          +---------+---------------+---------+-----------+----------+--------------+ POP      Partial        No       No                   Acute          +---------+---------------+---------+-----------+----------+--------------+ PTV      Full                                                         +---------+---------------+---------+-----------+----------+--------------+ PERO     None                                         Acute          +---------+---------------+---------+-----------+----------+--------------+ EIV      None           No       No                   Acute          +---------+---------------+---------+-----------+----------+--------------+ Unable to visualize CIV, or IVC due to bowel gas.    Summary: Right: There is no evidence of deep vein thrombosis in the lower extremity. No cystic structure found in the popliteal fossa. Left: Findings consistent with acute deep vein thrombosis involving the left external iliac vein, common femoral vein, left femoral vein, left popliteal vein, and left peroneal veins. No cystic structure found in the popliteal fossa. Unable to visualize CIV, or IVC due to bowel gas.  *See table(s) above for measurements and observations. Electronically signed by Deitra Mayo MD on 04/11/2019 at 4:09:39 PM.  Final     ASSESSMENT AND PLAN: 1.  Retroperitoneal and pelvic lymphadenopathy 2.  Left lower extremity DVT 3.  History of stage Ib seminoma testicular cancer diagnosed in 2012, status post right orchiectomy-no adjuvant chemotherapy given  -Status post biopsy of a left retroperitoneal lymph node.  Results are pending.  We have contacted pathology to try to get a preliminary result and we may have one later today.  We will discuss this with the patient as soon as it is available.  We will make arrangements for the patient to see oncology after discharge at [facility] closer to where he lives. -Remains on heparin drip with improvement of his left lower extremity edema.  IR is planning for an MRI today for consideration of TPA.    LOS: 2 days   [name], DNP, AGPCNP-BC, AOCNP 04/13/19   Addendum  I have seen the patient, examined him. I agree with the assessment and and plan and have edited the notes.   Patient's left  lower extremity edema has improved some, especially around the knee, but overall his left leg is still quite swelling.  His fiance came from [place], and was at bedside when I saw him.  I discussed with pathologist this morning, the primary biopsy result is seminoma. This is late recurrence from his previous seminoma in 2012. I recommend systemic chemotherapy, to begin in the next few weeks, when he returns to [place].  He will likely be transferred to [facility] for thrombolysis tomorrow.   IR Dr. Earleen Newport called me and discussed the role of IVC stent placement extensively. 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 I have discussed with my partner Dr. Alen Blew who sees most of GU malignancy in our department, and he feels it's beneficial to have a IVC stent placed now. I discussed the above with pt, he is interested, but understand that the stent is nor retrievable, and would like to discuss with IR further.   We do not recommend urgent chemo during this hospital stay, and pt prefers to start chemo when he gets back to [place].   I will send his referral to [facility] and will set up his med/onc appointment for him before his discharge.   Truitt Merle  04/13/2019

## 2019-04-13 NOTE — Progress Notes (Signed)
ANTICOAGULATION CONSULT NOTE  Pharmacy Consult for Heparin Indication: DVT  No Known Allergies  Patient Measurements: Height: 6\' 3"  (190.5 cm) Weight: 200 lb (90.7 kg) IBW/kg (Calculated) : 84.5  Vital Signs: Temp: 98.4 F (36.9 C) (01/28 0509) BP: 108/76 (01/28 0509) Pulse Rate: 87 (01/28 0509)  Labs: Recent Labs    04/10/19 2320 04/10/19 2320 04/11/19 0432 04/11/19 0432 04/11/19 0812 04/11/19 1258 04/12/19 0318 04/13/19 0423  HGB 13.3   < > 13.5   < >  --   --  12.8* 13.7  HCT 42.9   < > 42.6  --   --   --  40.2 43.3  PLT 183   < > 177  --   --   --  204 211  LABPROT  --   --   --   --   --  13.3  --   --   INR  --   --   --   --   --  1.0  --   --   HEPARINUNFRC  --   --   --   --  0.59  --  0.60 0.30  CREATININE 0.99  --  0.86  --   --   --   --   --   TROPONINIHS 2  --   --   --   --   --   --   --    < > = values in this interval not displayed.    Estimated Creatinine Clearance: 141.9 mL/min (by C-G formula based on SCr of 0.86 mg/dL).   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 Ultrasound + L DVT in femoral vein.   Baseline INR, aPTT: not done  Prior anticoagulation: none  Significant events: lymph node bx 1/27  Today, 04/13/2019:  S/p lymph node Bx yesterday, heparin resumed at 1300 at 1700 units/hr  Am heparin level at low end of goal at 0.3  Treatment team still deciding on catheter-directed thrombolysis  No bleeding or infusion issues per nursing  CBC WNL L thigh circumference up to 64.5 cm 1/26 lupus anticoagulant: neg 1/26 protein S WNL 1/26 protein C WNL 1/26 antithrombin III WNL 1/26 cardiolipin ab: ip 1/26 prothrombin gene: ip 1/26 factor 5 leiden: ip  Goal of Therapy: Heparin level 0.3-0.7 units/ml Monitor platelets by anticoagulation protocol: Yes  Plan:  Increase heparin drip to 1800 units/hr  Daily CBC and heparin level  Monitor for signs of bleeding  or thrombosis  F/u plans for any lytic therapy and eventual PO anticoagulation  Eudelia Bunch, Pharm.D [phone] 04/13/2019 7:58 AM

## 2019-04-14 ENCOUNTER — Other Ambulatory Visit: Payer: Self-pay | Admitting: Oncology

## 2019-04-14 DIAGNOSIS — C6290 Malignant neoplasm of unspecified testis, unspecified whether descended or undescended: Secondary | ICD-10-CM

## 2019-04-14 LAB — CBC
HCT: 45.5 % (ref 39.0–52.0)
Hemoglobin: 14.3 g/dL (ref 13.0–17.0)
MCH: 29.5 pg (ref 26.0–34.0)
MCHC: 31.4 g/dL (ref 30.0–36.0)
MCV: 93.8 fL (ref 80.0–100.0)
Platelets: 257 10*3/uL (ref 150–400)
RBC: 4.85 MIL/uL (ref 4.22–5.81)
RDW: 13.6 % (ref 11.5–15.5)
WBC: 7.9 10*3/uL (ref 4.0–10.5)
nRBC: 0 % (ref 0.0–0.2)

## 2019-04-14 LAB — BETA-2-GLYCOPROTEIN I ABS, IGG/M/A
Beta-2 Glyco I IgG: <9 GPI IgG units (ref 0–20)
Beta-2-Glycoprotein I IgA: <9 GPI IgA units (ref 0–25)
Beta-2-Glycoprotein I IgM: <9 GPI IgM units (ref 0–32)

## 2019-04-14 LAB — SURGICAL PATHOLOGY

## 2019-04-14 LAB — HEPARIN LEVEL (UNFRACTIONATED): Heparin Unfractionated: 0.32 IU/mL (ref 0.30–0.70)

## 2019-04-14 LAB — FACTOR 5 LEIDEN

## 2019-04-14 MED ORDER — POLYETHYLENE GLYCOL 3350 17 G PO PACK
17.0000 g | PACK | Freq: Every day | ORAL | Status: DC
  Administered 2019-04-16 – 2019-04-17 (×2): 17 g via ORAL
  Filled 2019-04-14 (×9): qty 1

## 2019-04-14 NOTE — Progress Notes (Signed)
Referring Physician(s): Truitt Merle   Supervising Physician: Daryll Brod  Patient Status:  [facility] - In-pt  Chief Complaint:  Left leg pain/swelling  Subjective: Patient reports he is doing "okay" today. He states some of the swelling in his left knee has gone down and that he has ambulated to the restroom and to his chair. He denies any new or worsening symptoms. He also denies any chest pain, SOB, fevers, chills or abdominal pain. Patient states he would like to have his DVT treated here in [place] instead of back home in [place]. Patient acknowledges that the procedure will take place at [facility]. Patient has no new imaging today. Labs and vitals are WNL. Patient is on heparin drip.   Allergies: Patient has no known allergies.  Medications: Prior to Admission medications   Medication Sig Start Date End Date Taking? Authorizing Provider  cyclobenzaprine (FLEXERIL) 10 MG tablet Take 1 tablet (10 mg total) by mouth 3 (three) times daily as needed for muscle spasms. 04/04/19  Yes Lamptey, Myrene Galas, MD  ibuprofen (ADVIL) 600 MG tablet Take 1 tablet (600 mg total) by mouth every 6 (six) hours as needed. 04/04/19  Yes Lamptey, Myrene Galas, MD     Vital Signs: BP 122/82 (BP Location: Left Arm)   Pulse 97   Temp 99.1 F (37.3 C) (Oral)   Resp (!) 21   Ht '6\' 3"'  (1.905 m)   Wt 200 lb (90.7 kg)   SpO2 100%   BMI 25.00 kg/m   Physical Exam Constitutional:      General: He is not in acute distress. Cardiovascular:     Rate and Rhythm: Normal rate and regular rhythm.     Pulses: Normal pulses.     Heart sounds: Normal heart sounds.     Comments: Pedal pulses present bilaterally Pulmonary:     Effort: Pulmonary effort is normal.     Breath sounds: Normal breath sounds.  Abdominal:     General: Abdomen is flat. Bowel sounds are normal.     Palpations: Abdomen is soft.  Skin:    General: Skin is warm and dry.  Neurological:     Mental Status: He is alert.   2-3 + LLE  pitting edema, less erythema of extremity, intact pulses ; RLE with 1+ edema  Imaging: CT Angio Chest PE W and/or Wo Contrast  Result Date: 04/11/2019 CLINICAL DATA:  Positive lower extremity DVT, PE suspected. Back pain and leg swelling, history of testicular cancer. EXAM: CT ANGIOGRAPHY CHEST CT ABDOMEN AND PELVIS WITH CONTRAST TECHNIQUE: Multidetector CT imaging of the chest was performed using the standard protocol during bolus administration of intravenous contrast. Multiplanar CT image reconstructions and MIPs were obtained to evaluate the vascular anatomy. Multidetector CT imaging of the abdomen and pelvis was performed using the standard protocol during bolus administration of intravenous contrast. CONTRAST:  17m OMNIPAQUE IOHEXOL 350 MG/ML SOLN COMPARISON:  None. FINDINGS: CTA CHEST FINDINGS Cardiovascular: Suboptimal opacification of the pulmonary arteries limits evaluation beyond the lobar level. No large central or lobar pulmonary arterial filling defects are identified. Central pulmonary arteries are normal caliber. Normal cardiac size. 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 The aorta is normal caliber. Shared origin of the brachiocephalic and left common carotid arteries. Great vessels are otherwise unremarkable. Mediastinum/Nodes: No enlarged mediastinal, hilar or axillary adenopathy is seen. No acute abnormality of the trachea or esophagus. Thyroid  gland is normal. Asymmetric thickening of the left sternocleidomastoid is likely related to muscle activation. Lungs/Pleura: Respiratory motion artifact is mild. No consolidation, features of edema, pneumothorax, or effusion. No suspicious pulmonary nodules or masses. Musculoskeletal: No acute osseous abnormality or suspicious osseous lesion in the imaged chest. No worrisome chest wall lesions. Mild bilateral gynecomastia is noted, right slightly greater left. Review of the MIP  images confirms the above findings. CT ABDOMEN and PELVIS FINDINGS Hepatobiliary: No focal liver abnormality is seen. No gallstones, gallbladder wall thickening, or biliary dilatation. Pancreas: Unremarkable. No pancreatic ductal dilatation or surrounding inflammatory changes. Spleen: Normal in size without focal abnormality. Adrenals/Urinary Tract: Adrenal glands are unremarkable. Kidneys are normal, without renal calculi, focal lesion, or hydronephrosis. Bladder is unremarkable. Stomach/Bowel: Distal esophagus, stomach and duodenal sweep are unremarkable. No small bowel wall thickening or dilatation. No evidence of obstruction. A normal appendix is visualized. No colonic dilatation or wall thickening. Scattered colonic diverticula without focal pericolonic inflammation to suggest diverticulitis. Vascular/Lymphatic: There is extensive retroperitoneal and pelvic adenopathy, some of the larger nodes include a left periaortic node measuring up to 3.2 cm (2/43) and a 2 cm aortocaval lymph node (2/35) there is distal abdominal aortic encasement by a extensive retroperitoneal adenopathy. Furthermore there is likely direct compression and/or direct nodal involvement of the inferior vena cava (2/40 with the lower IVC appearing expanded by hypoattenuating filling defects. Edematous changes are noted throughout the pelvis with venous engorgement and collateralization. Reproductive: The prostate and seminal vesicles are unremarkable. There is a varicocele seen in the left scrotum. External genitalia are incompletely included on this exam. Other: There is circumferential body wall edema from the level of the pelvis inferiorly more pronounced throughout the left lower extremity. Free fluid is seen in the deep pelvis, likely reactive or secondary to venous occlusion with developing collateralization. Musculoskeletal: No acute osseous abnormality or suspicious osseous lesion. Normal symmetric muscular enhancement. Review of the  MIP images confirms the above findings. IMPRESSION: 1. Suboptimal opacification of the pulmonary arteries limits evaluation beyond the lobar level. No large central or lobar pulmonary arterial filling defects are identified. 2. Extensive retroperitoneal and pelvic adenopathy. There is likely direct compression of the lower IVC with the distal IVC and iliac veins appearing expanded by hypoattenuating thrombus. There is complete encasement of the abdominal aorta as well but without luminal narrowing. Findings are highly concerning for metastatic disease, particularly given patient history of testicular cancer. 3. Left scrotal varicocele. External genitalia are incompletely included on this exam. Consider further evaluation scrotal ultrasound given extensive retroperitoneal adenopathy and patient's history of testicular cancer. 4. Free fluid in the deep pelvis is likely reactive or secondary to venous occlusion with developing collateralization. 5. Colonic diverticulosis without evidence of diverticulitis. 6. Mild bilateral gynecomastia, right slightly greater left. 7. 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 Critical Value/emergent results were called by telephone at the time of interpretation on 04/11/2019 at 1:33 am to [place] , who verbally acknowledged these results. Electronically Signed   By: Lovena Le M.D.   On: 04/11/2019 01:33   US Scrotum  Result Date: 04/11/2019 CLINICAL DATA:  Concern for testicular cancer EXAM: ULTRASOUND OF SCROTUM TECHNIQUE: Complete ultrasound examination of the testicles, epididymis, and other scrotal structures was performed. COMPARISON:  None. FINDINGS: Right testicle Measurements: Surgically removed. Left testicle Measurements: 3.9 x 2.2 x 2.6 cm. No mass or microlithiasis visualized. Right epididymis:  Not  visualized Left epididymis:  Normal in size and appearance. Hydrocele:  Moderate left  hydrocele Varicocele:  Moderate left varicocele. IMPRESSION: No left testicular abnormality.  Prior right orchiectomy. Moderate left hydrocele and varicocele. Electronically Signed   By: Rolm Baptise M.D.   On: 04/11/2019 02:16   CT ABDOMEN PELVIS W CONTRAST  Result Date: 04/11/2019 CLINICAL DATA:  Positive lower extremity DVT, PE suspected. Back pain and leg swelling, history of testicular cancer. EXAM: CT ANGIOGRAPHY CHEST CT ABDOMEN AND PELVIS WITH CONTRAST TECHNIQUE: Multidetector CT imaging of the chest was performed using the standard protocol during bolus administration of intravenous contrast. Multiplanar CT image reconstructions and MIPs were obtained to evaluate the vascular anatomy. Multidetector CT imaging of the abdomen and pelvis was performed using the standard protocol during bolus administration of intravenous contrast. CONTRAST:  153m OMNIPAQUE IOHEXOL 350 MG/ML SOLN COMPARISON:  None. FINDINGS: CTA CHEST FINDINGS Cardiovascular: Suboptimal opacification of the pulmonary arteries limits evaluation beyond the lobar level. No large central or lobar pulmonary arterial filling defects are identified. Central pulmonary arteries are normal caliber. Normal cardiac size. 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 The aorta is normal caliber. Shared origin of the brachiocephalic and left common carotid arteries. Great vessels are otherwise unremarkable. Mediastinum/Nodes: No enlarged mediastinal, hilar or axillary adenopathy is seen. No acute abnormality of the trachea or esophagus. Thyroid gland is normal. Asymmetric thickening of the left sternocleidomastoid is likely related to muscle activation. Lungs/Pleura: Respiratory motion artifact is mild. No consolidation, features of edema, pneumothorax, or effusion. No suspicious pulmonary nodules or masses. Musculoskeletal: No acute osseous abnormality or suspicious osseous lesion in the  imaged chest. No worrisome chest wall lesions. Mild bilateral gynecomastia is noted, right slightly greater left. Review of the MIP images confirms the above findings. CT ABDOMEN and PELVIS FINDINGS Hepatobiliary: No focal liver abnormality is seen. No gallstones, gallbladder wall thickening, or biliary dilatation. Pancreas: Unremarkable. No pancreatic ductal dilatation or surrounding inflammatory changes. Spleen: Normal in size without focal abnormality. Adrenals/Urinary Tract: Adrenal glands are unremarkable. Kidneys are normal, without renal calculi, focal lesion, or hydronephrosis. Bladder is unremarkable. Stomach/Bowel: Distal esophagus, stomach and duodenal sweep are unremarkable. No small bowel wall thickening or dilatation. No evidence of obstruction. A normal appendix is visualized. No colonic dilatation or wall thickening. Scattered colonic diverticula without focal pericolonic inflammation to suggest diverticulitis. Vascular/Lymphatic: There is extensive retroperitoneal and pelvic adenopathy, some of the larger nodes include a left periaortic node measuring up to 3.2 cm (2/43) and a 2 cm aortocaval lymph node (2/35) there is distal abdominal aortic encasement by a extensive retroperitoneal adenopathy. Furthermore there is likely direct compression and/or direct nodal involvement of the inferior vena cava (2/40 with the lower IVC appearing expanded by hypoattenuating filling defects. Edematous changes are noted throughout the pelvis with venous engorgement and collateralization. Reproductive: The prostate and seminal vesicles are unremarkable. There is a varicocele seen in the left scrotum. External genitalia are incompletely included on this exam. Other: There is circumferential body wall edema from the level of the pelvis inferiorly more pronounced throughout the left lower extremity. Free fluid is seen in the deep pelvis, likely reactive or secondary to venous occlusion with developing  collateralization. Musculoskeletal: No acute osseous abnormality or suspicious osseous lesion. Normal symmetric muscular enhancement. Review of the MIP images confirms the above findings. IMPRESSION: 1. Suboptimal opacification of the pulmonary arteries limits evaluation beyond the lobar level. No  large central or lobar pulmonary arterial filling defects are identified. 2. Extensive retroperitoneal and pelvic adenopathy. There is likely direct compression of the lower IVC with the distal IVC and iliac veins appearing expanded by hypoattenuating thrombus. There is complete encasement of the abdominal aorta as well but without luminal narrowing. Findings are highly concerning for metastatic disease, particularly given patient history of testicular cancer. 3. Left scrotal varicocele. External genitalia are incompletely included on this exam. Consider further evaluation scrotal ultrasound given extensive retroperitoneal adenopathy and patient's history of testicular cancer. 4. Free fluid in the deep pelvis is likely reactive or secondary to venous occlusion with developing collateralization. 5. Colonic diverticulosis without evidence of diverticulitis. 6. Mild bilateral gynecomastia, right slightly greater left. 7. 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 Critical Value/emergent results were called by telephone at the time of interpretation on 04/11/2019 at 1:33 am to [place] , who verbally acknowledged these results. Electronically Signed   By: Lovena Le M.D.   On: 04/11/2019 01:33   CT BIOPSY  Result Date: 04/12/2019 INDICATION: 39 year old male with a history of testicular cancer previously treated. He presents with new retroperitoneal adenopathy EXAM: CT BIOPSY MEDICATIONS: None. ANESTHESIA/SEDATION: Moderate (conscious) sedation was employed during this procedure. A total of Versed 2.0 mg and Fentanyl 100 mcg was administered  intravenously. Moderate Sedation Time: 14 minutes. The patient's level of consciousness and vital signs were monitored continuously by radiology nursing throughout the procedure under my direct supervision. FLUOROSCOPY TIME:  CT COMPLICATIONS: None PROCEDURE: Informed written consent was obtained from the patient after a thorough discussion of the procedural risks, benefits and alternatives. All questions were addressed. Maximal Sterile Barrier Technique was utilized including caps, mask, sterile gowns, sterile gloves, sterile drape, hand hygiene and skin antiseptic. A timeout was performed prior to the initiation of the procedure. Patient positioned prone position on CT gantry table. Scout CT acquired for planning purposes. The patient was then prepped and draped in the usual sterile fashion. 1% lidocaine was used for local anesthesia. Introducer needle was then placed targeting the left-sided retroperitoneal lymphadenopathy. Once we confirmed needle tip position, multiple 18 gauge core biopsy were acquired. Specimen placed in the saline as S fresh specimen. Needle was removed and a final CT was acquired. Patient tolerated the procedure well and remained hemodynamically stable throughout. No complications were encountered and no significant blood loss. IMPRESSION: Status post CT-guided biopsy of left retroperitoneal lymphadenopathy. Signed, Dulcy Fanny. Dellia Nims, RPVI [facility] Radiology Electronically Signed   By: [name] D.O.   On: 04/12/2019 12:56   MR MRV ABDOMEN W WO CONTRAST  Result Date: 04/13/2019 CLINICAL DATA:  39 year old male with retroperitoneal lymphadenopathy, history of testicular cancer previously treated, now with ilio caval DVT on prior CT, and left lower extremity DVT on duplex. EXAM: MRI ABDOMEN WITHOUT AND WITH CONTRAST TECHNIQUE: Multiplanar multisequence MR imaging of the abdomen was performed both before and after the administration  of intravenous contrast. CONTRAST:  75m GADAVIST GADOBUTROL 1 MMOL/ML IV SOLN COMPARISON:  CT 04/11/2019 FINDINGS: Vascular: Arterial: Unremarkable course caliber and contour of the visualized abdominal aorta. Unremarkable course caliber and contour of the bilateral iliac arteries and proximal femoral arteries. Bilateral hypogastric arteries are patent. Venous: Right iliac/femoral system: Flow signal is maintained within the visualized proximal femoral veins. Flow signal maintained into the external iliac vein. Absent flow signal with evidence of thrombus involving  the proximal right common iliac vein to the confluence and into the IVC. Expansion of the right common iliac vein. Multiple pelvic collateral vessels. Left iliac/femoral system: Absent flow signal through the length of the left iliac venous system extending through the femoral vein. Expansion of the left iliac and femoral venous system. Multiple pelvic collateral veins. IVC: The IVC is not well evaluated at the level of the adenopathy. Absent flow signal above the confluence of the iliac veins compatible with ilio caval thrombus. The juxtarenal IVC is patent with maintained flow signal and maintain flow signal of the bilateral renal veins. Nonvascular: Unremarkable musculoskeletal structures. Partially distended urinary bladder. Unremarkable appearance of the visualized kidneys and GI system. Edema of the lower abdominal wall/pelvic body wall, as well as edema of the left proximal lower extremity. 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 Pathologic lymphadenopathy of the retroperitoneum, similar to comparison CT, contributing to external compression of the infrarenal IVC. The current MR is nondiagnostic to confirm or rule out tumor thrombus. Lower abdominal/pelvic wall edema including edema of the proximal left lower extremity.  Signed, Dulcy Fanny. Dellia Nims, RPVI [facility] Radiology Electronically Signed   By: [name] D.O.   On: 04/13/2019 14:14   VAS [place] LOWER EXTREMITY VENOUS (DVT)  Result Date: 04/11/2019  Lower Venous Study Indications: Swelling.  Risk Factors: None identified. Anticoagulation: Heparin. Limitations: Poor ultrasound/tissue interface and bowel gas. Comparison Study: No prior studies. Performing Technologist: Oliver Hum RVT  Examination Guidelines: A complete evaluation includes B-mode imaging, spectral Doppler, color Doppler, and power Doppler as needed of all accessible portions of each vessel. Bilateral testing is considered an integral part of a complete examination. Limited examinations for reoccurring indications may be performed as noted.  +---------+---------------+---------+-----------+----------+--------------+ RIGHT    CompressibilityPhasicitySpontaneityPropertiesThrombus Aging +---------+---------------+---------+-----------+----------+--------------+ CFV      Full           Yes      Yes                                 +---------+---------------+---------+-----------+----------+--------------+ SFJ      Full                                                        +---------+---------------+---------+-----------+----------+--------------+ FV Prox  Full                                                        +---------+---------------+---------+-----------+----------+--------------+ FV Mid   Full                                                        +---------+---------------+---------+-----------+----------+--------------+ FV DistalFull                                                        +---------+---------------+---------+-----------+----------+--------------+  PFV      Full                                                         +---------+---------------+---------+-----------+----------+--------------+ POP      Full           Yes      Yes                                 +---------+---------------+---------+-----------+----------+--------------+ PTV      Full                                                        +---------+---------------+---------+-----------+----------+--------------+ PERO     Full                                                        +---------+---------------+---------+-----------+----------+--------------+   +---------+---------------+---------+-----------+----------+--------------+ LEFT     CompressibilityPhasicitySpontaneityPropertiesThrombus Aging +---------+---------------+---------+-----------+----------+--------------+ CFV      None           No       No                   Acute          +---------+---------------+---------+-----------+----------+--------------+ SFJ      None                                         Acute          +---------+---------------+---------+-----------+----------+--------------+ FV Prox  None           No       No                   Acute          +---------+---------------+---------+-----------+----------+--------------+ FV Mid   None           No       No                   Acute          +---------+---------------+---------+-----------+----------+--------------+ FV DistalNone           No       No                   Acute          +---------+---------------+---------+-----------+----------+--------------+ POP      Partial        No       No                   Acute          +---------+---------------+---------+-----------+----------+--------------+ PTV      Full                                                        +---------+---------------+---------+-----------+----------+--------------+  PERO     None                                         Acute           +---------+---------------+---------+-----------+----------+--------------+ EIV      None           No       No                   Acute          +---------+---------------+---------+-----------+----------+--------------+ Unable to visualize CIV, or IVC due to bowel gas.    Summary: Right: There is no evidence of deep vein thrombosis in the lower extremity. No cystic structure found in the popliteal fossa. Left: Findings consistent with acute deep vein thrombosis involving the left external iliac vein, common femoral vein, left femoral vein, left popliteal vein, and left peroneal veins. No cystic structure found in the popliteal fossa. Unable to visualize CIV, or IVC due to bowel gas.  *See table(s) above for measurements and observations. Electronically signed by Deitra Mayo MD on 04/11/2019 at 4:09:39 PM.    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  Patient is doing well today and has consented to treating his DVT with thrombectomy, lysis and stent placement. Patient will be transferred to [facility] for the procedure. Continue heparin drip.  Case has been reviewed by Dr. Pascal Lux.   Details/risks of thrombolytic therapy/angioplasty/thrombectomy/possible stent placement have been discussed in great detail with patient and fianc, including but not limited to, internal bleeding, infection, injury to adjacent structures, inability to fully lysed clot, with their understanding and consent.  Tentative plans are to begin lytic therapy on 1/30.  Electronically Signed: D. Rowe Robert, PA-C/ Donnamarie Rossetti, PA-S 04/14/2019, 4:44 PM   I spent a total of 25 minutes at the the patient's bedside AND on the patient's hospital floor or unit, greater than 50% of which was counseling/coordinating care for left lower extremity DVT/possible thrombolytic therapy     Patient ID: Jeno Calleros, male   DOB: 1980/11/11, 39 y.o.   MRN: [id number]

## 2019-04-14 NOTE — Progress Notes (Signed)
Report called to receiving RN at [facility]. All questions answered. Pt transported to [facility] via [facility].

## 2019-04-14 NOTE — Progress Notes (Signed)
Pt transferred to 4E-07 via [facility]. Pt to bed. CHG bath given. Tele applied, CCMD notified. Pt oriented to bed, call bell and room. Call bell within reach. VSS. Will continue to monitor. Amanda Cockayne, RN

## 2019-04-14 NOTE — TOC Benefit Eligibility Note (Signed)
Transition of Care [facility]) Benefit Eligibility Note    Patient Details  Name: Angeldejesus Vanamburg MRN: [id number] Date of Birth: 04/09/1980   Medication/Dose: Alveda Reasons 20mg   po, lovenoxnot covered enoxaparin 135mg  sq  Covered?: Yes  Tier: 3 Drug  Prescription Coverage Preferred Pharmacy: local pharmacy  Spoke with Person/Company/Phone Number:: Donna/ [facility] [phone]  Co-Pay: xarelto no generic $477.19, Enoxaparin $20.00  Prior Approval: No          Kerin Salen Phone Number: 04/14/2019, 3:27 PM

## 2019-04-14 NOTE — Progress Notes (Signed)
ANTICOAGULATION CONSULT NOTE  Pharmacy Consult for Heparin Indication: DVT  No Known Allergies  Patient Measurements: Height: 6\' 3"  (190.5 cm) Weight: 200 lb (90.7 kg) IBW/kg (Calculated) : 84.5  Vital Signs: BP: 127/85 (01/29 0606) Pulse Rate: 107 (01/29 0606)  Labs: Recent Labs    04/11/19 1258 04/12/19 0318 04/12/19 0318 04/13/19 0423 04/14/19 0447  HGB  --  12.8*   < > 13.7 14.3  HCT  --  40.2  --  43.3 45.5  PLT  --  204  --  211 257  LABPROT 13.3  --   --   --   --   INR 1.0  --   --   --   --   HEPARINUNFRC  --  0.60  --  0.30 0.32   < > = values in this interval not displayed.    Estimated Creatinine Clearance: 141.9 mL/min (by C-G formula based on SCr of 0.86 mg/dL).   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 Ultrasound + L DVT in femoral vein.   Baseline INR, aPTT: not done  Prior anticoagulation: none  Significant events: lymph node bx 1/27  Today, 04/14/2019:  S/p lymph node Bx yesterday, heparin resumed at 1300 at 1700 units/hr  AM heparin level at low end of goal at 0.32  Treatment team still deciding on catheter-directed thrombolysis  No bleeding or infusion issues per nursing; no complications from biopsy  CBC WNL  L thigh still remains edematous despite therapeutic heparin infusion, although CT suggests IVC compression d/t retroperitoneal adenopathy  Goal of Therapy: Heparin level 0.3-0.7 units/ml Monitor platelets by anticoagulation protocol: Yes  Plan:  Increase heparin drip to 1950 units/hr - although failure for edema to improve may be related to IVC compression, will at least target heparin levels closer to mid-upper range  Daily CBC and heparin level  Monitor for signs of bleeding or thrombosis  F/u plans for any lytic therapy and eventual chronic anticoagulation for discharge  Reuel Boom, PharmD, BCPS [phone] 04/14/2019, 9:04 AM

## 2019-04-14 NOTE — Progress Notes (Signed)
PROGRESS NOTE    Timothy [name]  [id number] DOB: [birth year] DOA: 04/10/2019 PCP: Patient, No Pcp Per    Brief Narrative:  39 year old gentleman with history of testicular cancer stage Ib, left orchiectomy in 2012 at [facility], last surveillance in 2014 at [facility] travel to [place] about a week ago to see his mother and he started having gradual worsening swelling of the left leg and some back pain.  Came to the emergency room with increased swelling, he was found to have extensive left lower extremity DVTs in all veins and intra-abdominal and retroperitoneal lymphadenopathy consistent with metastatic cancer.   Assessment & Plan:   Principal Problem:   DVT (deep venous thrombosis) (HCC) Active Problems:   H/O testicular cancer   Lymphadenopathy, retroperitoneal   Seminoma ([place])  Acute DVT, multiple veins left lower extremity with IVC compression and encasement by cancer: Still with significant swelling. Remains on heparin, will continue until some clinical improvement. Continue monitoring. Followed by interventional radiology, possible thrombectomy and stenting. Cancer provoked DVT, extensive thromboembolism, will suggest Lovenox on discharge once he is ready.  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  Followed by oncology.  Once patient is stable and diagnosis established, he will probably like to go back to [facility] for treatment. Oncology will co ordinate for follow up.  Out of bed to chair.  Thrombolysis and IVC stenting decisions pending by IR.  DVT prophylaxis: Heparin infusion Code Status: Full code Family Communication: None Disposition Plan: patient is from home. Anticipated DC to home, Barriers to discharge on heparin infusion and improvement in leg swelling.  Consultants:   Oncology  IR  Procedures:   CT-guided biopsy of the lymph node.  Antimicrobials:   None   Subjective: Patient seen and examined.   No overnight events.  He has not been out of bed yet.  Leg swelling persistent, however he thinks his swelling around the knee is better.  Objective: Vitals:   04/13/19 0509 04/13/19 1230 04/13/19 2020 04/14/19 0606  BP: 108/76 125/78 140/86 127/85  Pulse: 87 (!) 104 (!) 118 (!) 107  Resp: 18 20 18 16   Temp: 98.4 F (36.9 C) 99.7 F (37.6 C) 100.3 F (37.9 C)   TempSrc:  Oral Oral   SpO2: 100% 100% 98% 98%  Weight:      Height:        Intake/Output Summary (Last 24 hours) at 04/14/2019 1107 Last data filed at 04/14/2019 0800 Gross per 24 hour  Intake 1397.3 ml  Output 1875 ml  Net -477.7 ml   Filed Weights   04/10/19 2005  Weight: 90.7 kg    Examination:  General exam: Appears calm and comfortable, on room air. Respiratory system: Clear to auscultation. Respiratory effort normal.  No added sounds. Cardiovascular system: S1 & S2 heard, RRR. No JVD, murmurs, rubs, gallops or clicks.  Gastrointestinal system: Abdomen is nondistended, soft and nontender. No organomegaly or masses felt. Normal bowel sounds heard. Central nervous system: Alert and oriented. No focal neurological deficits. Extremities: Symmetric 5 x 5 power. Skin: No rashes, lesions or ulcers Psychiatry: Judgement and insight appear normal. Mood & affect appropriate.   Left leg is diffusely swollen.  Knee swelling is better.  Distal neurovascular status intact.     Data Reviewed: I have personally reviewed following labs and imaging studies  CBC: Recent Labs  Lab 04/10/19 2320 04/11/19 0432 04/12/19 0318 04/13/19 0423 04/14/19 0447  WBC 6.1 6.1 7.3 6.9 7.9  HGB 13.3 13.5 12.8*  13.7 14.3  HCT 42.9 42.6 40.2 43.3 45.5  MCV 94.5 94.2 92.6 94.3 93.8  PLT 183 177 204 211 [id number]   Basic Metabolic Panel: Recent Labs  Lab 04/10/19 2320 04/11/19 0432  NA 140 138  K 4.3 4.1  CL 106 105  CO2 26 26  GLUCOSE 97 97  BUN 12 10  CREATININE 0.99 0.86  CALCIUM 9.0 8.8*   GFR: Estimated Creatinine  Clearance: 141.9 mL/min (by C-G formula based on SCr of 0.86 mg/dL). Liver Function Tests: Recent Labs  Lab 04/11/19 0432  AST 17  ALT 16  ALKPHOS 59  BILITOT 0.8  PROT 7.8  ALBUMIN 3.9   No results for input(s): LIPASE, AMYLASE in the last 168 hours. No results for input(s): AMMONIA in the last 168 hours. Coagulation Profile: Recent Labs  Lab 04/11/19 1258  INR 1.0   Cardiac Enzymes: No results for input(s): CKTOTAL, CKMB, CKMBINDEX, TROPONINI in the last 168 hours. BNP (last 3 results) No results for input(s): PROBNP in the last 8760 hours. HbA1C: No results for input(s): HGBA1C in the last 72 hours. CBG: No results for input(s): GLUCAP in the last 168 hours. Lipid Profile: No results for input(s): CHOL, HDL, LDLCALC, TRIG, CHOLHDL, LDLDIRECT in the last 72 hours. Thyroid Function Tests: No results for input(s): TSH, T4TOTAL, FREET4, T3FREE, THYROIDAB in the last 72 hours. Anemia Panel: No results for input(s): VITAMINB12, FOLATE, FERRITIN, TIBC, IRON, RETICCTPCT in the last 72 hours. Sepsis Labs: No results for input(s): PROCALCITON, LATICACIDVEN in the last 168 hours.  Recent Results (from the past 240 hour(s))  SARS CORONAVIRUS 2 (TAT 6-24 HRS) Nasopharyngeal Nasopharyngeal Swab     Status: None   Collection Time: 04/11/19 12:32 AM   Specimen: Nasopharyngeal Swab  Result Value Ref Range Status   SARS Coronavirus 2 NEGATIVE NEGATIVE Final    Comment: (NOTE) SARS-CoV-2 target nucleic acids are NOT DETECTED. The SARS-CoV-2 RNA is generally detectable in upper and lower respiratory specimens during the acute phase of infection. Negative results do not preclude SARS-CoV-2 infection, do not rule out co-infections with other pathogens, and should not be used as the sole basis for treatment or other patient management decisions. Negative results must be combined with clinical observations, patient history, and epidemiological information. The expected result is  Negative. Fact Sheet for Patients: SugarRoll.be Fact Sheet for Healthcare Providers: https://www.woods-mathews.com/ This test is not yet approved or cleared by the [place] FDA and  has been authorized for detection and/or diagnosis of SARS-CoV-2 by FDA under an Emergency Use Authorization (EUA). This EUA will remain  in effect (meaning this test can be used) for the duration of the COVID-19 declaration under Section 56 4(b)(1) of the Act, 21 U.S.C. section 360bbb-3(b)(1), unless the authorization is terminated or revoked sooner. Performed at [facility], [place] [address], [place], [place] [address]          Radiology Studies: CT BIOPSY  Result Date: 04-23-19 INDICATION: 39 year old male with a history of testicular cancer previously treated. He presents with new retroperitoneal adenopathy EXAM: CT BIOPSY MEDICATIONS: None. ANESTHESIA/SEDATION: Moderate (conscious) sedation was employed during this procedure. A total of Versed 2.0 mg and Fentanyl 100 mcg was administered intravenously. Moderate Sedation Time: 14 minutes. The patient's level of consciousness and vital signs were monitored continuously by radiology nursing throughout the procedure under my direct supervision. FLUOROSCOPY TIME:  CT COMPLICATIONS: None PROCEDURE: Informed written consent was obtained from the patient after a thorough discussion of the procedural risks, benefits  and alternatives. All questions were addressed. Maximal Sterile Barrier Technique was utilized including caps, mask, sterile gowns, sterile gloves, sterile drape, hand hygiene and skin antiseptic. A timeout was performed prior to the initiation of the procedure. Patient positioned prone position on CT gantry table. Scout CT acquired for planning purposes. The patient was then prepped and draped in the usual sterile fashion. 1% lidocaine was used for local anesthesia. Introducer needle was then placed  targeting the left-sided retroperitoneal lymphadenopathy. Once we confirmed needle tip position, multiple 18 gauge core biopsy were acquired. Specimen placed in the saline as S fresh specimen. Needle was removed and a final CT was acquired. Patient tolerated the procedure well and remained hemodynamically stable throughout. No complications were encountered and no significant blood loss. IMPRESSION: Status post CT-guided biopsy of left retroperitoneal lymphadenopathy. Signed, Dulcy Fanny. Dellia Nims, RPVI [facility] Radiology Electronically Signed   By: [name] D.O.   On: 04/12/2019 12:56   MR MRV ABDOMEN W WO CONTRAST  Result Date: 04/13/2019 CLINICAL DATA:  39 year old male with retroperitoneal lymphadenopathy, history of testicular cancer previously treated, now with ilio caval DVT on prior CT, and left lower extremity DVT on duplex. EXAM: MRI ABDOMEN WITHOUT AND WITH CONTRAST TECHNIQUE: Multiplanar multisequence MR imaging of the abdomen was performed both before and after the administration of intravenous contrast. CONTRAST:  74mL GADAVIST GADOBUTROL 1 MMOL/ML IV SOLN COMPARISON:  CT 04/11/2019 FINDINGS: Vascular: Arterial: Unremarkable course caliber and contour of the visualized abdominal aorta. Unremarkable course caliber and contour of the bilateral iliac arteries and proximal femoral arteries. Bilateral hypogastric arteries are patent. Venous: Right iliac/femoral system: Flow signal is maintained within the visualized proximal femoral veins. Flow signal maintained into the external iliac vein. Absent flow signal with evidence of thrombus involving the proximal right common iliac vein to the confluence and into the IVC. Expansion of the right common iliac vein. Multiple pelvic collateral vessels. Left iliac/femoral system: Absent flow signal through the length of the left iliac venous system extending through the femoral vein. Expansion of the left  iliac and femoral venous system. Multiple pelvic collateral veins. IVC: The IVC is not well evaluated at the level of the adenopathy. Absent flow signal above the confluence of the iliac veins compatible with ilio caval thrombus. The juxtarenal IVC is patent with maintained flow signal and maintain flow signal of the bilateral renal veins. Nonvascular: Unremarkable musculoskeletal structures. Partially distended urinary bladder. Unremarkable appearance of the visualized kidneys and GI system. Edema of the lower abdominal wall/pelvic body wall, as well as edema of the left proximal lower extremity. 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 Pathologic lymphadenopathy of the retroperitoneum, similar to comparison CT, contributing to external compression of the infrarenal IVC. The current MR is nondiagnostic to confirm or rule out tumor thrombus. Lower abdominal/pelvic wall edema including edema of the proximal left lower extremity. Signed, Dulcy Fanny. Dellia Nims, RPVI [facility] Radiology Electronically Signed   By: [name] D.O.   On: 04/13/2019 14:14        Scheduled Meds:  polyethylene glycol  17 g Oral Daily   sodium chloride flush  3 mL Intravenous Q12H   Continuous Infusions:  sodium chloride     heparin 1,950 Units/hr (04/14/19 1009)     LOS: 3 days    Time spent: 30 minutes  Barb Merino, MD [facility] Pager [phone]

## 2019-04-14 NOTE — Progress Notes (Signed)
Pt has not had a bowl movement since admission. Pt reports mild abdominal pain. MD notified. See new orders.

## 2019-04-14 NOTE — TOC Initial Note (Signed)
Transition of Care [facility]) - Initial/Assessment Note    Patient Details  Name: Timothy [name] MRN: [id number] Date of Birth: Apr 06, 1980  [facility] of [facility]) CM/SW Contact:    Dessa Phi, RN Phone Number: 04/14/2019, 2:01 PM  Clinical Narrative:  Referral for benefit check-xarelto-await outcome.                 Expected Discharge Plan: Home/Self Care Barriers to Discharge: Continued Medical Work up   Patient Goals and CMS Choice Patient states their goals for this hospitalization and ongoing recovery are:: go home      Expected Discharge Plan and Services Expected Discharge Plan: Home/Self Care   Discharge Planning Services: CM Consult   Living arrangements for the past 2 months: Single Family Home                                      Prior Living Arrangements/Services Living arrangements for the past 2 months: Single Family Home Lives with:: Significant Other Patient language and need for interpreter reviewed:: Yes Do you feel safe going back to the place where you live?: Yes      Need for Family Participation in Patient Care: No (Comment) Care giver support system in place?: Yes (comment)   Criminal Activity/Legal Involvement Pertinent to Current Situation/Hospitalization: No - Comment as needed  Activities of Daily Living Home Assistive Devices/Equipment: None ADL Screening (condition at time of admission) Patient's cognitive ability adequate to safely complete daily activities?: Yes Is the patient deaf or have difficulty hearing?: No Does the patient have difficulty seeing, even when wearing glasses/contacts?: No Does the patient have difficulty concentrating, remembering, or making decisions?: No Patient able to express need for assistance with ADLs?: Yes Does the patient have difficulty dressing or bathing?: Yes Independently performs ADLs?: Yes (appropriate for developmental age) Does the patient have difficulty walking or climbing stairs?:  Yes(currently ) Weakness of Legs: Left Weakness of Arms/Hands: None  Permission Sought/Granted Permission sought to share information with : Case Manager Permission granted to share information with : Yes, Verbal Permission Granted  Share Information with NAME: Case Manager     Permission granted to share info w Relationship: Jami Ramberan [phone]     Emotional Assessment Appearance:: Appears stated age Attitude/Demeanor/Rapport: Gracious Affect (typically observed): Accepting Orientation: : Oriented to Self, Oriented to Place, Oriented to  Time, Oriented to Situation Alcohol / Substance Use: Not Applicable Psych Involvement: No (comment)  Admission diagnosis:  DVT (deep venous thrombosis) (HCC) [I82.409] VTE (venous thromboembolism) [I82.90] Lymphadenopathy, retroperitoneal [R59.0] Patient Active Problem List   Diagnosis Date Noted  . Seminoma ([place])   . DVT (deep venous thrombosis) ([place]) 04/11/2019  . H/O testicular cancer 04/11/2019  . Lymphadenopathy, retroperitoneal    PCP:  Patient, No Pcp Per Pharmacy:   [facility] [place], [place] [address] Phone: [phone] Fax: [phone]     Social Determinants of Health (SDOH) Interventions    Readmission Risk Interventions No flowsheet data found.

## 2019-04-15 ENCOUNTER — Inpatient Hospital Stay (HOSPITAL_COMMUNITY): Payer: BLUE CROSS/BLUE SHIELD

## 2019-04-15 HISTORY — PX: IR US GUIDE VASC ACCESS LEFT: IMG2389

## 2019-04-15 HISTORY — PX: IR VENOCAVAGRAM IVC: IMG678

## 2019-04-15 HISTORY — PX: IR THROMBECT VENO MECH MOD SED: IMG2300

## 2019-04-15 HISTORY — PX: IR VENO/EXT/BI: IMG677

## 2019-04-15 HISTORY — PX: IR INFUSION THROMBOL VENOUS INITIAL (MS): IMG5377

## 2019-04-15 HISTORY — PX: IR US GUIDE VASC ACCESS RIGHT: IMG2390

## 2019-04-15 LAB — CBC WITH DIFFERENTIAL/PLATELET
Abs Immature Granulocytes: 0.05 10*3/uL (ref 0.00–0.07)
Basophils Absolute: 0 10*3/uL (ref 0.0–0.1)
Basophils Relative: 1 %
Eosinophils Absolute: 0.1 10*3/uL (ref 0.0–0.5)
Eosinophils Relative: 2 %
HCT: 41.1 % (ref 39.0–52.0)
Hemoglobin: 13.5 g/dL (ref 13.0–17.0)
Immature Granulocytes: 1 %
Lymphocytes Relative: 11 %
Lymphs Abs: 0.7 10*3/uL (ref 0.7–4.0)
MCH: 30 pg (ref 26.0–34.0)
MCHC: 32.8 g/dL (ref 30.0–36.0)
MCV: 91.3 fL (ref 80.0–100.0)
Monocytes Absolute: 0.9 10*3/uL (ref 0.1–1.0)
Monocytes Relative: 14 %
Neutro Abs: 4.8 10*3/uL (ref 1.7–7.7)
Neutrophils Relative %: 71 %
Platelets: 265 10*3/uL (ref 150–400)
RBC: 4.5 MIL/uL (ref 4.22–5.81)
RDW: 13.3 % (ref 11.5–15.5)
WBC: 6.7 10*3/uL (ref 4.0–10.5)
nRBC: 0 % (ref 0.0–0.2)

## 2019-04-15 LAB — CBC
HCT: 39.9 % (ref 39.0–52.0)
HCT: 36 % — ABNORMAL LOW (ref 39.0–52.0)
Hemoglobin: 13.7 g/dL (ref 13.0–17.0)
Hemoglobin: 12.2 g/dL — ABNORMAL LOW (ref 13.0–17.0)
MCH: 30.9 pg (ref 26.0–34.0)
MCH: 30.4 pg (ref 26.0–34.0)
MCHC: 34.3 g/dL (ref 30.0–36.0)
MCHC: 33.9 g/dL (ref 30.0–36.0)
MCV: 90.1 fL (ref 80.0–100.0)
MCV: 89.8 fL (ref 80.0–100.0)
Platelets: 257 10*3/uL (ref 150–400)
Platelets: 231 10*3/uL (ref 150–400)
RBC: 4.43 MIL/uL (ref 4.22–5.81)
RBC: 4.01 MIL/uL — ABNORMAL LOW (ref 4.22–5.81)
RDW: 13.4 % (ref 11.5–15.5)
RDW: 13.6 % (ref 11.5–15.5)
WBC: 10 10*3/uL (ref 4.0–10.5)
WBC: 9.4 10*3/uL (ref 4.0–10.5)
nRBC: 0 % (ref 0.0–0.2)
nRBC: 0 % (ref 0.0–0.2)

## 2019-04-15 LAB — BASIC METABOLIC PANEL
Anion gap: 10 (ref 5–15)
BUN: 11 mg/dL (ref 6–20)
CO2: 28 mmol/L (ref 22–32)
Calcium: 8.9 mg/dL (ref 8.9–10.3)
Chloride: 96 mmol/L — ABNORMAL LOW (ref 98–111)
Creatinine, Ser: 1 mg/dL (ref 0.61–1.24)
GFR calc Af Amer: >60 mL/min (ref >60)
GFR calc non Af Amer: >60 mL/min (ref >60)
Glucose, Bld: 95 mg/dL (ref 70–99)
Potassium: 4.1 mmol/L (ref 3.5–5.1)
Sodium: 134 mmol/L — ABNORMAL LOW (ref 135–145)

## 2019-04-15 LAB — HEPARIN LEVEL (UNFRACTIONATED)
Heparin Unfractionated: 0.48 IU/mL (ref 0.30–0.70)
Heparin Unfractionated: 0.21 IU/mL — ABNORMAL LOW (ref 0.30–0.70)

## 2019-04-15 LAB — FIBRINOGEN: Fibrinogen: 516 mg/dL — ABNORMAL HIGH (ref 210–475)

## 2019-04-15 MED ORDER — IOHEXOL 300 MG/ML  SOLN
100.0000 mL | Freq: Once | INTRAMUSCULAR | Status: AC | PRN
  Administered 2019-04-15: 60 mL via INTRAVENOUS

## 2019-04-15 MED ORDER — SODIUM CHLORIDE 0.9% FLUSH
3.0000 mL | Freq: Two times a day (BID) | INTRAVENOUS | Status: DC
  Administered 2019-04-16 – 2019-04-22 (×3): 3 mL via INTRAVENOUS

## 2019-04-15 MED ORDER — FENTANYL CITRATE (PF) 100 MCG/2ML IJ SOLN
INTRAMUSCULAR | Status: AC | PRN
  Administered 2019-04-15: 50 ug via INTRAVENOUS
  Administered 2019-04-15 (×3): 25 ug via INTRAVENOUS

## 2019-04-15 MED ORDER — MIDAZOLAM HCL 2 MG/2ML IJ SOLN
1.0000 mg | INTRAMUSCULAR | Status: DC | PRN
  Administered 2019-04-17 (×2): 1 mg via INTRAVENOUS
  Filled 2019-04-15 (×2): qty 2

## 2019-04-15 MED ORDER — SODIUM CHLORIDE 0.9 % IV SOLN
0.5000 mg/h | INTRAVENOUS | Status: AC
  Filled 2019-04-15 (×2): qty 10

## 2019-04-15 MED ORDER — CHLORHEXIDINE GLUCONATE CLOTH 2 % EX PADS
6.0000 | MEDICATED_PAD | Freq: Every day | CUTANEOUS | Status: DC
  Administered 2019-04-15 – 2019-04-28 (×13): 6 via TOPICAL

## 2019-04-15 MED ORDER — LIDOCAINE HCL (PF) 1 % IJ SOLN
INTRAMUSCULAR | Status: AC | PRN
  Administered 2019-04-15: 10 mL

## 2019-04-15 MED ORDER — SODIUM CHLORIDE 0.9 % IV SOLN
0.2500 mg/h | INTRAVENOUS | Status: DC
  Administered 2019-04-15 (×3): 0.25 mg/h
  Filled 2019-04-15 (×3): qty 10

## 2019-04-15 MED ORDER — MIDAZOLAM HCL 2 MG/2ML IJ SOLN
INTRAMUSCULAR | Status: AC
  Filled 2019-04-15: qty 6

## 2019-04-15 MED ORDER — LORAZEPAM 2 MG/ML IJ SOLN: INTRAMUSCULAR | Status: AC | PRN

## 2019-04-15 MED ORDER — MIDAZOLAM HCL 2 MG/2ML IJ SOLN
INTRAMUSCULAR | Status: AC | PRN
  Administered 2019-04-15: 0.5 mg via INTRAVENOUS
  Administered 2019-04-15: 1 mg via INTRAVENOUS
  Administered 2019-04-15: 0.5 mg via INTRAVENOUS
  Administered 2019-04-15: 1 mg via INTRAVENOUS

## 2019-04-15 MED ORDER — HEPARIN (PORCINE) 25000 UT/250ML-% IV SOLN: 800.0000 [IU]/h | INTRAVENOUS | Status: DC

## 2019-04-15 MED ORDER — SODIUM CHLORIDE 0.9 % IV SOLN: INTRAVENOUS | Status: AC | PRN

## 2019-04-15 MED ORDER — MORPHINE SULFATE (PF) 4 MG/ML IV SOLN
4.0000 mg | INTRAVENOUS | Status: DC | PRN
  Administered 2019-04-15 – 2019-04-24 (×40): 4 mg via INTRAVENOUS
  Filled 2019-04-15 (×42): qty 1

## 2019-04-15 MED ORDER — HYDRALAZINE HCL 20 MG/ML IJ SOLN
INTRAMUSCULAR | Status: AC
  Administered 2019-04-15: 10 mg via INTRAVENOUS
  Filled 2019-04-15: qty 1

## 2019-04-15 MED ORDER — MORPHINE SULFATE (PF) 4 MG/ML IV SOLN
5.0000 mg | INTRAVENOUS | Status: DC | PRN
  Filled 2019-04-15: qty 2

## 2019-04-15 MED ORDER — LIDOCAINE HCL 1 % IJ SOLN
INTRAMUSCULAR | Status: AC
  Filled 2019-04-15: qty 20

## 2019-04-15 MED ORDER — ONDANSETRON HCL 4 MG/2ML IJ SOLN
4.0000 mg | Freq: Four times a day (QID) | INTRAMUSCULAR | Status: DC | PRN
  Administered 2019-04-16 – 2019-04-23 (×4): 4 mg via INTRAVENOUS
  Filled 2019-04-15 (×4): qty 2

## 2019-04-15 MED ORDER — SODIUM CHLORIDE 0.9 % IV SOLN
250.0000 mL | INTRAVENOUS | Status: DC | PRN
  Administered 2019-04-15: 10 mL via INTRAVENOUS

## 2019-04-15 MED ORDER — HYDRALAZINE HCL 20 MG/ML IJ SOLN: 10.0000 mg | INTRAMUSCULAR | Status: DC | PRN

## 2019-04-15 MED ORDER — FENTANYL CITRATE (PF) 100 MCG/2ML IJ SOLN
INTRAMUSCULAR | Status: AC
  Filled 2019-04-15: qty 6

## 2019-04-15 MED ORDER — SODIUM CHLORIDE 0.9% FLUSH: 3.0000 mL | INTRAVENOUS | Status: DC | PRN

## 2019-04-15 MED ORDER — GADOBUTROL 1 MMOL/ML IV SOLN
9.0000 mL | Freq: Once | INTRAVENOUS | Status: AC | PRN
  Administered 2019-04-15: 9 mL via INTRAVENOUS

## 2019-04-15 NOTE — Progress Notes (Signed)
ANTICOAGULATION CONSULT NOTE  Pharmacy Consult for Heparin Indication: DVT  No Known Allergies  Patient Measurements: Height: 6\' 3"  (190.5 cm) Weight: 200 lb (90.7 kg) IBW/kg (Calculated) : 84.5  Vital Signs: Temp: 99 F (37.2 C) (01/30 0239) Temp Source: Oral (01/30 0239) BP: 117/86 (01/30 0239) Pulse Rate: 93 (01/30 0239)  Labs: Recent Labs    04/13/19 0423 04/14/19 0447  HGB 13.7 14.3  HCT 43.3 45.5  PLT 211 257  HEPARINUNFRC 0.30 0.32    Estimated Creatinine Clearance: 141.9 mL/min (by C-G formula based on SCr of 0.86 mg/dL).   Assessment: 38 yo M with hx testicular CA and recent travel from [place] presents with left leg pain & swelling. Ultrasound + L DVT in femoral vein. No anticoagulation PTA. Patient transferred to [facility] for treatment of DVT with thrombectomy, lysis, and stent placement.  Heparin level therapeutic at 0.48 after increasing drip rate to 1950 units/hr. CBC wnl. No overt bleeding noted.   Goal of Therapy: Heparin level 0.3-0.7 units/ml Monitor platelets by anticoagulation protocol: Yes  Plan: - Continue heparin IV at 1950 units/hr - Daily heparin level, CBC - Monitor for signs of bleeding  Richardine Service, PharmD PGY1 Pharmacy Resident Phone: [phone] 04/15/2019  9:10 AM  Please check [email or web address] for unit-specific pharmacy phone numbers.

## 2019-04-15 NOTE — Progress Notes (Signed)
Patient back to room 4E07 from MRI. VSS. Alert and oriented to room and call light.

## 2019-04-15 NOTE — Procedures (Signed)
Pre procedural Dx: Recurrent testicular cancer, now with retroperitoneal lymphadenopathy resulting in IVC occlusion and bilateral pelvic DVT Post procedural Dx: Same  Technically successful initiation of bilateral pelvic and IVC mechanical and pharmacological thrombectomy for occlusive DVT.  Access: Bilateral popliteal veins.   EBL: None.  Complications: None immediate.   Ronny Bacon, MD Pager #: [phone]

## 2019-04-15 NOTE — Progress Notes (Signed)
PROGRESS NOTE    Timothy [name]  [id number] DOB: [birth year] DOA: 04/10/2019 PCP: Patient, No Pcp Per    Brief Narrative:  39 year old gentleman with history of testicular cancer stage Ib, left orchiectomy in 2012 at [facility], last surveillance in 2014 at [facility] travel to [place] about a week ago to see his mother and he started having gradual worsening swelling of the left leg and some back pain.  Came to the emergency room with increased swelling, he was found to have extensive left lower extremity DVTs in all veins and intra-abdominal and retroperitoneal lymphadenopathy consistent with metastatic cancer.   Assessment & Plan:   Principal Problem:   DVT (deep venous thrombosis) (HCC) Active Problems:   H/O testicular cancer   Lymphadenopathy, retroperitoneal   Seminoma ([place])  Acute DVT, multiple veins left lower extremity with IVC compression and encasement by cancer: Still with significant swelling but he tells me that this has improved since his hospitalization. It remains painful for him to ambulate.  Remains on heparin, will continue until some clinical improvement. Continue monitoring. Followed by interventional radiology, with thrombectomy and possible stenting to be done today. Cancer provoked DVT, extensive thromboembolism, will suggest Lovenox on discharge once he is ready. Per nursing: patient will need to be transferred to ICU for monitor post-procedure due to need for close monitoring and nursing care. Transfer orders placed.  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  Followed by oncology.  Once patient is stable and diagnosis established, he will probably like to go back to [facility] for treatment. Oncology will co ordinate for follow up.  Out of bed to chair.  Thrombolysis and IVC stenting decisions pending intervention by IR.  DVT prophylaxis: Heparin infusion Code Status: Full code Family Communication:  None Disposition Plan: patient is from home. Anticipated DC to home, Barriers to discharge on heparin infusion and improvement in leg swelling.  Consultants:   Oncology  IR  Procedures:   CT-guided biopsy of the lymph node.  Antimicrobials:   None   Subjective: Patient seen and examined early this afternoon. Denied epistaxis, hematuria, melena. Had persistent left leg edema.   Objective: Vitals:   04/15/19 1535 04/15/19 1540 04/15/19 1545 04/15/19 1551  BP: (!) 140/105 (!) 145/109 (!) 155/144 (!) 153/102  Pulse: 93 88 92 81  Resp: (!) 21 20 16  (!) 24  Temp:      TempSrc:      SpO2: 99% 100% 98% 100%  Weight:      Height:        Intake/Output Summary (Last 24 hours) at 04/15/2019 1632 Last data filed at 04/15/2019 0800 Gross per 24 hour  Intake 765.56 ml  Output --  Net 765.56 ml   Filed Weights   04/10/19 2005  Weight: 90.7 kg    Examination:  General exam: Appears calm and comfortable, NAD.  Respiratory system: Clear to auscultation. Respiratory effort normal.  Cardiovascular system: S1 & S2 present, RRR.  Gastrointestinal system: Abdomen is nondistended, soft and nontender.  Central nervous system: Alert and oriented x 3. No focal neurological deficits. Extremities: Symmetric 5 x 5 power. Left leg is diffusely swollen, worsen over the calf area. Has mild knee edema with no erythema. Distal pulse present, no cyanosis.  Skin: No rashes, lesions or ulcers Psychiatry:  Mood & affect appropriate.   Data Reviewed: I have personally reviewed following labs and imaging studies  CBC: Recent Labs  Lab 04/11/19 0432 04/12/19 [id number] 04/13/19 0423 04/14/19 0447 04/15/19 0751  WBC 6.1 7.3 6.9 7.9 6.7  NEUTROABS  --   --   --   --  4.8  HGB 13.5 12.8* 13.7 14.3 13.5  HCT 42.6 40.2 43.3 45.5 41.1  MCV 94.2 92.6 94.3 93.8 91.3  PLT 177 204 211 257 [id number]   Basic Metabolic Panel: Recent Labs  Lab 04/10/19 2320 04/11/19 0432 04/15/19 0751  NA 140 138 134*  K  4.3 4.1 4.1  CL 106 105 96*  CO2 26 26 28   GLUCOSE 97 97 95  BUN 12 10 11   CREATININE 0.99 0.86 1.00  CALCIUM 9.0 8.8* 8.9   GFR: Estimated Creatinine Clearance: 122.1 mL/min (by C-G formula based on SCr of 1 mg/dL). Liver Function Tests: Recent Labs  Lab 04/11/19 0432  AST 17  ALT 16  ALKPHOS 59  BILITOT 0.8  PROT 7.8  ALBUMIN 3.9   No results for input(s): LIPASE, AMYLASE in the last 168 hours. No results for input(s): AMMONIA in the last 168 hours. Coagulation Profile: Recent Labs  Lab 04/11/19 1258  INR 1.0   Cardiac Enzymes: No results for input(s): CKTOTAL, CKMB, CKMBINDEX, TROPONINI in the last 168 hours. BNP (last 3 results) No results for input(s): PROBNP in the last 8760 hours. HbA1C: No results for input(s): HGBA1C in the last 72 hours. CBG: No results for input(s): GLUCAP in the last 168 hours. Lipid Profile: No results for input(s): CHOL, HDL, LDLCALC, TRIG, CHOLHDL, LDLDIRECT in the last 72 hours. Thyroid Function Tests: No results for input(s): TSH, T4TOTAL, FREET4, T3FREE, THYROIDAB in the last 72 hours. Anemia Panel: No results for input(s): VITAMINB12, FOLATE, FERRITIN, TIBC, IRON, RETICCTPCT in the last 72 hours. Sepsis Labs: No results for input(s): PROCALCITON, LATICACIDVEN in the last 168 hours.  Recent Results (from the past 240 hour(s))  SARS CORONAVIRUS 2 (TAT 6-24 HRS) Nasopharyngeal Nasopharyngeal Swab     Status: None   Collection Time: 04/11/19 12:32 AM   Specimen: Nasopharyngeal Swab  Result Value Ref Range Status   SARS Coronavirus 2 NEGATIVE NEGATIVE Final    Comment: (NOTE) SARS-CoV-2 target nucleic acids are NOT DETECTED. The SARS-CoV-2 RNA is generally detectable in upper and lower respiratory specimens during the acute phase of infection. Negative results do not preclude SARS-CoV-2 infection, do not rule out co-infections with other pathogens, and should not be used as the sole basis for treatment or other patient  management decisions. Negative results must be combined with clinical observations, patient history, and epidemiological information. The expected result is Negative. Fact Sheet for Patients: SugarRoll.be Fact Sheet for Healthcare Providers: https://www.woods-mathews.com/ This test is not yet approved or cleared by the [place] FDA and  has been authorized for detection and/or diagnosis of SARS-CoV-2 by FDA under an Emergency Use Authorization (EUA). This EUA will remain  in effect (meaning this test can be used) for the duration of the COVID-19 declaration under Section 56 4(b)(1) of the Act, 21 U.S.C. section 360bbb-3(b)(1), unless the authorization is terminated or revoked sooner. Performed at [facility], [place] [address] [place], [place] [address]          Radiology Studies: MR BRAIN W WO CONTRAST  Result Date: 04/15/2019 CLINICAL DATA:  History of testicular cancer. Assess for metastatic disease. EXAM: MRI HEAD WITHOUT AND WITH CONTRAST TECHNIQUE: Multiplanar, multiecho pulse sequences of the brain and surrounding structures were obtained without and with intravenous contrast. CONTRAST:  73mL GADAVIST GADOBUTROL 1 MMOL/ML IV SOLN COMPARISON:  None. FINDINGS: Brain: The brain has a normal  appearance without evidence of malformation, atrophy, old or acute small or large vessel infarction, mass lesion, hemorrhage, hydrocephalus or extra-axial collection. After contrast administration, no abnormal enhancement occurs. Vascular: Major vessels at the base of the brain show flow. Venous sinuses appear patent. Skull and upper cervical spine: Normal. Sinuses/Orbits: Clear/normal. Other: None significant. IMPRESSION: Normal examination.  No evidence of metastatic disease. Electronically Signed   By: Nelson Chimes M.D.   On: 04/15/2019 11:07        Scheduled Meds: . fentaNYL      . lidocaine      . midazolam      . [MAR Hold]  polyethylene glycol  17 g Oral Daily  . [MAR Hold] sodium chloride flush  3 mL Intravenous Q12H   Continuous Infusions: . [MAR Hold] sodium chloride    . alteplase (LIMB ISCHEMIA) 10 mg in normal saline (0.02 mg/mL) infusion    . alteplase (LIMB ISCHEMIA) 10 mg in normal saline (0.02 mg/mL) infusion    . alteplase (tPA) 10mg  in NS 532mL for IR Angiojet Procedure 50 mL/hr at 04/15/19 1430  . heparin 1,950 Units/hr (04/15/19 1247)     LOS: 4 days    Time spent: 30 minutes    Blain Pais, MD [facility] Pager [phone]

## 2019-04-15 NOTE — Progress Notes (Signed)
ANTICOAGULATION CONSULT NOTE  Pharmacy Consult for Heparin + tpa Indication: DVT  No Known Allergies  Patient Measurements: Height: 6\' 3"  (190.5 cm) Weight: 200 lb (90.7 kg) IBW/kg (Calculated) : 84.5  Vital Signs: Temp: 100.7 F (38.2 C) (01/30 1900) Temp Source: Oral (01/30 1900) BP: 129/89 (01/30 2100) Pulse Rate: 99 (01/30 2100)  Labs: Recent Labs    04/14/19 0447 04/14/19 0447 04/15/19 0751 04/15/19 1805 04/15/19 2029  HGB 14.3   < > 13.5 13.7  --   HCT 45.5  --  41.1 39.9  --   PLT 257  --  265 257  --   HEPARINUNFRC 0.32  --  0.48  --  0.21*  CREATININE  --   --  1.00  --   --    < > = values in this interval not displayed.    Estimated Creatinine Clearance: 122.1 mL/min (by C-G formula based on SCr of 1 mg/dL).   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 Ultrasound + L DVT in femoral vein. No anticoagulation PTA. Patient transferred to [facility] for treatment of DVT with thrombectomy, lysis, and stent placement.  Heparin level therapeutic at 0.48 after increasing drip rate to 1950 units/hr. CBC wnl. No overt bleeding noted.  S/p IR today now with tpa running 0.25mg /hr in Right leg and 0.5mg /hr in Left leg Heparin peripheral drip 1950 HL fell 0.48>0.2 this evening  CBC stable, fibrinogen 500 Will increase heparin slightly   Goal of Therapy: Heparin level 0.3-0.7 units/ml Monitor platelets by anticoagulation protocol: Yes  Plan: - Increase  heparin IV to 2000 units/hr - Q6h heparin level, CBC, fibrinogen - Monitor for signs of bleeding  Bonnita Nasuti Pharm.D. CPP, BCPS Clinical Pharmacist [phone] 04/15/2019 9:34 PM    Please check [email or web address] for unit-specific pharmacy phone numbers.

## 2019-04-16 ENCOUNTER — Inpatient Hospital Stay (HOSPITAL_COMMUNITY): Payer: BLUE CROSS/BLUE SHIELD

## 2019-04-16 HISTORY — PX: IR TRANSCATH PLC STENT 1ST ART NOT LE CV CAR VERT CAR: IMG5443

## 2019-04-16 HISTORY — PX: IR THROMB F/U EVAL ART/VEN FINAL DAY (MS): IMG5379

## 2019-04-16 HISTORY — PX: IR THROMBECT VENO MECH REPT MOD SED: IMG2301

## 2019-04-16 LAB — FIBRINOGEN
Fibrinogen: 383 mg/dL (ref 210–475)
Fibrinogen: 264 mg/dL (ref 210–475)
Fibrinogen: 268 mg/dL (ref 210–475)

## 2019-04-16 LAB — BASIC METABOLIC PANEL
Anion gap: 11 (ref 5–15)
BUN: 16 mg/dL (ref 6–20)
CO2: 24 mmol/L (ref 22–32)
Calcium: 8.6 mg/dL — ABNORMAL LOW (ref 8.9–10.3)
Chloride: 98 mmol/L (ref 98–111)
Creatinine, Ser: 1.27 mg/dL — ABNORMAL HIGH (ref 0.61–1.24)
GFR calc Af Amer: >60 mL/min (ref >60)
GFR calc non Af Amer: >60 mL/min (ref >60)
Glucose, Bld: 102 mg/dL — ABNORMAL HIGH (ref 70–99)
Potassium: 4 mmol/L (ref 3.5–5.1)
Sodium: 133 mmol/L — ABNORMAL LOW (ref 135–145)

## 2019-04-16 LAB — CBC
HCT: 38.6 % — ABNORMAL LOW (ref 39.0–52.0)
HCT: 39.2 % (ref 39.0–52.0)
Hemoglobin: 12.8 g/dL — ABNORMAL LOW (ref 13.0–17.0)
Hemoglobin: 12.7 g/dL — ABNORMAL LOW (ref 13.0–17.0)
MCH: 30.1 pg (ref 26.0–34.0)
MCH: 29.8 pg (ref 26.0–34.0)
MCHC: 33.2 g/dL (ref 30.0–36.0)
MCHC: 32.4 g/dL (ref 30.0–36.0)
MCV: 90.8 fL (ref 80.0–100.0)
MCV: 92 fL (ref 80.0–100.0)
Platelets: 220 10*3/uL (ref 150–400)
Platelets: 225 10*3/uL (ref 150–400)
RBC: 4.25 MIL/uL (ref 4.22–5.81)
RBC: 4.26 MIL/uL (ref 4.22–5.81)
RDW: 13.7 % (ref 11.5–15.5)
RDW: 13.8 % (ref 11.5–15.5)
WBC: 11 10*3/uL — ABNORMAL HIGH (ref 4.0–10.5)
WBC: 9.1 10*3/uL (ref 4.0–10.5)
nRBC: 0 % (ref 0.0–0.2)
nRBC: 0 % (ref 0.0–0.2)

## 2019-04-16 LAB — CARDIOLIPIN ANTIBODIES, IGG, IGM, IGA
Anticardiolipin IgA: <9 APL U/mL (ref 0–11)
Anticardiolipin IgG: 9 GPL U/mL (ref 0–14)
Anticardiolipin IgM: <9 MPL U/mL (ref 0–12)

## 2019-04-16 LAB — HEPARIN LEVEL (UNFRACTIONATED)
Heparin Unfractionated: 0.23 IU/mL — ABNORMAL LOW (ref 0.30–0.70)
Heparin Unfractionated: 0.37 IU/mL (ref 0.30–0.70)
Heparin Unfractionated: 0.59 IU/mL (ref 0.30–0.70)
Heparin Unfractionated: 0.53 IU/mL (ref 0.30–0.70)

## 2019-04-16 LAB — GLUCOSE, CAPILLARY
Glucose-Capillary: 115 mg/dL — ABNORMAL HIGH (ref 70–99)
Glucose-Capillary: 86 mg/dL (ref 70–99)

## 2019-04-16 MED ORDER — HYDROMORPHONE HCL 1 MG/ML IJ SOLN
INTRAMUSCULAR | Status: AC | PRN
  Administered 2019-04-16 (×5): 0.5 mg via INTRAVENOUS

## 2019-04-16 MED ORDER — MIDAZOLAM HCL 2 MG/2ML IJ SOLN
INTRAMUSCULAR | Status: AC | PRN
  Administered 2019-04-16: 1 mg via INTRAVENOUS
  Administered 2019-04-16 (×2): 0.5 mg via INTRAVENOUS

## 2019-04-16 MED ORDER — HEPARIN SODIUM (PORCINE) 1000 UNIT/ML IJ SOLN
INTRAMUSCULAR | Status: AC | PRN
  Administered 2019-04-16: 3000 [IU] via INTRAVENOUS
  Administered 2019-04-16: 4000 [IU] via INTRAVENOUS

## 2019-04-16 MED ORDER — MIDAZOLAM HCL 2 MG/2ML IJ SOLN
INTRAMUSCULAR | Status: AC
  Filled 2019-04-16: qty 4

## 2019-04-16 MED ORDER — CHLORHEXIDINE GLUCONATE 4 % EX LIQD
CUTANEOUS | Status: AC
  Filled 2019-04-16: qty 15

## 2019-04-16 MED ORDER — HYDRALAZINE HCL 20 MG/ML IJ SOLN
INTRAMUSCULAR | Status: AC
  Filled 2019-04-16: qty 1

## 2019-04-16 MED ORDER — LIDOCAINE HCL 1 % IJ SOLN
INTRAMUSCULAR | Status: AC | PRN
  Administered 2019-04-16: 20 mL

## 2019-04-16 MED ORDER — LIDOCAINE HCL 1 % IJ SOLN
INTRAMUSCULAR | Status: AC
  Filled 2019-04-16: qty 20

## 2019-04-16 MED ORDER — SODIUM CHLORIDE 0.9 % IV SOLN: 250.0000 mL | INTRAVENOUS | Status: DC | PRN

## 2019-04-16 MED ORDER — IOHEXOL 300 MG/ML  SOLN
100.0000 mL | Freq: Once | INTRAMUSCULAR | Status: AC | PRN
  Administered 2019-04-16: 40 mL via INTRAVENOUS

## 2019-04-16 MED ORDER — IOHEXOL 300 MG/ML  SOLN
100.0000 mL | Freq: Once | INTRAMUSCULAR | Status: AC | PRN
  Administered 2019-04-16: 45 mL via INTRAVENOUS

## 2019-04-16 MED ORDER — SODIUM CHLORIDE 0.9 % IV SOLN
INTRAVENOUS | Status: DC
  Administered 2019-04-16 – 2019-04-17 (×2): 100 mL/h via INTRAVENOUS

## 2019-04-16 MED ORDER — HYDROMORPHONE HCL 1 MG/ML IJ SOLN
INTRAMUSCULAR | Status: AC
  Filled 2019-04-16: qty 1

## 2019-04-16 MED ORDER — HYDROMORPHONE HCL 1 MG/ML IJ SOLN
INTRAMUSCULAR | Status: AC
  Filled 2019-04-16: qty 2

## 2019-04-16 MED ORDER — HEPARIN SODIUM (PORCINE) 1000 UNIT/ML IJ SOLN
INTRAMUSCULAR | Status: AC
  Filled 2019-04-16: qty 1

## 2019-04-16 MED ORDER — CHLORHEXIDINE GLUCONATE 4 % EX LIQD
CUTANEOUS | Status: AC | PRN
  Administered 2019-04-16: 1 via TOPICAL

## 2019-04-16 MED ORDER — LIDOCAINE-EPINEPHRINE 1 %-1:100000 IJ SOLN
INTRAMUSCULAR | Status: AC
  Filled 2019-04-16: qty 1

## 2019-04-16 NOTE — Progress Notes (Signed)
Pt w/ c/o unable to void, feeling "Full". Bladder scan revealed >999 cc of urine. Pt attempting one more time voided 800 cc dark tea colored urine. Post void scan revealed 514 cc remaining. Dr Pascal Lux (IR) paged and discussed Pt status and vital signs. Oders receive to increase IVF to 100 cc and insert foloey cath and leave in. Procedure and necessity explained to Pt, accepts insertion.

## 2019-04-16 NOTE — Progress Notes (Signed)
After MD order obtained, 60fr foley cath inserted after description and necessity explained to Pt. Peri care provided after hand hygiene and 16 fr foley passed without difficulty with return of cloudy tea colored urine, Foley secured with foley anchor and foley care provided. Pt tolerated procedure without difficulty.

## 2019-04-16 NOTE — Progress Notes (Signed)
ANTICOAGULATION CONSULT NOTE Pharmacy Consult for Heparin Indication: DVT  No Known Allergies  Patient Measurements: Height: 6\' 3"  (190.5 cm) Weight: 200 lb (90.7 kg) IBW/kg (Calculated) : 84.5  Vital Signs: Temp: 99.5 F (37.5 C) (01/31 1127) Temp Source: Oral (01/31 1127) BP: 129/90 (01/31 1242) Pulse Rate: 104 (01/31 1242)  Labs: Recent Labs    04/15/19 0751 04/15/19 1805 04/15/19 2317 04/15/19 2317 04/16/19 0450 04/16/19 1202  HGB 13.5   < > 12.2*   < > 12.8* 12.7*  HCT 41.1   < > 36.0*  --  38.6* 39.2  PLT 265   < > 231  --  220 225  HEPARINUNFRC 0.48   < > 0.23*  --  0.37 0.59  CREATININE 1.00  --  1.27*  --   --   --    < > = values in this interval not displayed.    Estimated Creatinine Clearance: 96.1 mL/min (A) (by C-G formula based on SCr of 1.27 mg/dL (H)).   Assessment: 39 yo M with DVT s/p thrombectomy and lysis on alteplase, for heparin.  Heparin level at goal this afternoon.  Pt currently back in IR.  Goal of Therapy: Heparin level 0.3-0.7 units/ml Monitor platelets by anticoagulation protocol: Yes  Plan: Cont heparin 2200 units/hr F/u plans for heparin after IR.  Uvaldo Rising, BCPS, [place] Pharmacist Phone [phone]  04/16/2019 1:06 PM

## 2019-04-16 NOTE — Progress Notes (Signed)
ANTICOAGULATION CONSULT NOTE Pharmacy Consult for Heparin Indication: DVT  No Known Allergies  Patient Measurements: Height: 6\' 3"  (190.5 cm) Weight: 200 lb (90.7 kg) IBW/kg (Calculated) : 84.5  Vital Signs: Temp: 98.9 F (37.2 C) (01/31 0400) Temp Source: Oral (01/31 0400) BP: 132/95 (01/31 0500) Pulse Rate: 93 (01/31 0500)  Labs: Recent Labs    04/15/19 0751 04/15/19 0751 04/15/19 1805 04/15/19 1805 04/15/19 2029 04/15/19 2317 04/16/19 0450  HGB 13.5   < > 13.7   < >  --  12.2* 12.8*  HCT 41.1   < > 39.9  --   --  36.0* 38.6*  PLT 265   < > 257  --   --  231 220  HEPARINUNFRC 0.48   < >  --   --  0.21* 0.23* 0.37  CREATININE 1.00  --   --   --   --  1.27*  --    < > = values in this interval not displayed.    Estimated Creatinine Clearance: 96.1 mL/min (A) (by C-G formula based on SCr of 1.27 mg/dL (H)).   Assessment: 39 yo M with DVT s/p thrombectomy and lysis on alteplase, for heparin  1/31 AM update:  Heparin level therapeutic   Goal of Therapy: Heparin level 0.3-0.7 units/ml Monitor platelets by anticoagulation protocol: Yes  Plan: Cont heparin 2200 units/hr 1200 heparin level   Narda Bonds, PharmD, BCPS Clinical Pharmacist Phone: [phone]

## 2019-04-16 NOTE — Progress Notes (Signed)
PROGRESS NOTE    Timothy [name]  JYN:[id number] DOB: 07-Feb-1981 DOA: 04/10/2019 PCP: Patient, No Pcp Per    Brief Narrative:  39 year old gentleman with history of testicular cancer stage Ib, left orchiectomy in 2012 at [facility], last surveillance in 2014 at [facility] travel to [place] about a week ago to see his mother and he started having gradual worsening swelling of the left leg and some back pain.  Came to the emergency room with increased swelling, he was found to have extensive left lower extremity DVTs in all veins and intra-abdominal and retroperitoneal lymphadenopathy consistent with metastatic cancer.   Assessment & Plan:   Principal Problem:   DVT (deep venous thrombosis) (HCC) Active Problems:   H/O testicular cancer   Lymphadenopathy, retroperitoneal   Seminoma ([place])  Acute DVT, multiple veins left lower extremity with IVC compression and encasement by cancer: Still with significant swelling but this has improved since his hospitalization. It remains painful for him to ambulate.  Remains on heparin, will continue until some clinical improvement. Continue monitoring. Followed by interventional radiology, procedure on 1/31 by IR, s/p mechanical and pharmacological thrombolysis requiring overlapping stenting of the IVC for resistant malignant narrowing.  Cancer provoked DVT, extensive thromboembolism, will suggest Lovenox on discharge once he is ready. Per nursing: patient needs to be in ICU for monitoring post-procedure due to need for close monitoring and nursing care. Hospitalist service remains primary.  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  Followed by oncology.  Once patient is stable and diagnosis established, he will probably like to go back to [facility] for treatment. Oncology will co ordinate for follow up.  Out of bed to chair.  Thrombolysis and IVC stenting by IR.  DVT prophylaxis: Heparin infusion Code  Status: Full code Family Communication: None Disposition Plan: patient is from home. Anticipated DC to home, Barriers to discharge on heparin infusion and improvement in leg swelling.  Consultants:   Oncology  IR  Procedures:   CT-guided biopsy of the lymph node.  Antimicrobials:   None   Subjective: Patient with persistent leg edema and pain. Had IR procedure again this afternoon.   Objective: Vitals:   04/16/19 1450 04/16/19 1455 04/16/19 1500 04/16/19 1515  BP: (!) 143/93 (!) 141/94 (!) 150/89 (!) 141/102  Pulse: (!) 125 (!) 125  (!) 123  Resp: (!) _0 Temp:      TempSrc:      SpO2: 97% 100% 100% 100%  Weight:      Height:        Intake/Output Summary (Last 24 hours) at 04/16/2019 1543 Last data filed at 04/16/2019 1200 Gross per 24 hour  Intake 5080.87 ml  Output 3354 ml  Net 1726.87 ml   Filed Weights   04/10/19 2005  Weight: 90.7 kg    Examination:  General exam: Appears calm and comfortable, NAD.  Respiratory system:  Respiratory effort normal.  Cardiovascular system: S1 & S2 present, RRR.  Gastrointestinal system: Abdomen is nondistended, soft and nontender.  Central nervous system: Alert, somnolent. No focal neurological deficits. Extremities: Left leg is diffusely swollen, worse over the calf area. Has mild knee edema with no erythema.  Skin: No rashes, lesions or ulcers Psychiatry:  Mood & affect appropriate.   Data Reviewed: I have personally reviewed following labs and imaging studies  CBC: Recent Labs  Lab 04/15/19 0751 04/15/19 1805 04/15/19 2317 04/16/19 0450 04/16/19 1202  WBC 6.7 10.0 9.4 11.0* 9.1  NEUTROABS 4.8  --   --   --   --  HGB 13.5 13.7 12.2* 12.8* 12.7*  HCT 41.1 39.9 36.0* 38.6* 39.2  MCV 91.3 90.1 89.8 90.8 92.0  PLT 265 257 231 220 161   Basic Metabolic Panel: Recent Labs  Lab 04/10/19 2320 04/11/19 0432 04/15/19 0751 04/15/19 2317  NA 140 138 134* 133*  K 4.3 4.1 4.1 4.0  CL 106 105 96* 98    CO2 _0 GLUCOSE 97 97 95 102*  BUN _1 CREATININE 0.99 0.86 1.00 1.27*  CALCIUM 9.0 8.8* 8.9 8.6*   GFR: Estimated Creatinine Clearance: 96.1 mL/min (A) (by C-G formula based on SCr of 1.27 mg/dL (H)). Liver Function Tests: Recent Labs  Lab 04/11/19 0432  AST 17  ALT 16  ALKPHOS 59  BILITOT 0.8  PROT 7.8  ALBUMIN 3.9   No results for input(s): LIPASE, AMYLASE in the last 168 hours. No results for input(s): AMMONIA in the last 168 hours. Coagulation Profile: Recent Labs  Lab 04/11/19 1258  INR 1.0   Cardiac Enzymes: No results for input(s): CKTOTAL, CKMB, CKMBINDEX, TROPONINI in the last 168 hours. BNP (last 3 results) No results for input(s): PROBNP in the last 8760 hours. HbA1C: No results for input(s): HGBA1C in the last 72 hours. CBG: Recent Labs  Lab 04/16/19 0729 04/16/19 1124  GLUCAP 115* 86   Lipid Profile: No results for input(s): CHOL, HDL, LDLCALC, TRIG, CHOLHDL, LDLDIRECT in the last 72 hours. Thyroid Function Tests: No results for input(s): TSH, T4TOTAL, FREET4, T3FREE, THYROIDAB in the last 72 hours. Anemia Panel: No results for input(s): VITAMINB12, FOLATE, FERRITIN, TIBC, IRON, RETICCTPCT in the last 72 hours. Sepsis Labs: No results for input(s): PROCALCITON, LATICACIDVEN in the last 168 hours.  Recent Results (from the past 240 hour(s))  SARS CORONAVIRUS 2 (TAT 6-24 HRS) Nasopharyngeal Nasopharyngeal Swab     Status: None   Collection Time: 04/11/19 12:32 AM   Specimen: Nasopharyngeal Swab  Result Value Ref Range Status   SARS Coronavirus 2 NEGATIVE NEGATIVE Final    Comment: (NOTE) SARS-CoV-2 target nucleic acids are NOT DETECTED. The SARS-CoV-2 RNA is generally detectable in upper and lower respiratory specimens during the acute phase of infection. Negative results do not preclude SARS-CoV-2 infection, do not rule out co-infections with other pathogens, and should not be used as the sole basis for treatment or other  patient management decisions. Negative results must be combined with clinical observations, patient history, and epidemiological information. The expected result is Negative. Fact Sheet for Patients: SugarRoll.be Fact Sheet for Healthcare Providers: https://www.woods-Timothy.com/ This test is not yet approved or cleared by the [place] FDA and  has been authorized for detection and/or diagnosis of SARS-CoV-2 by FDA under an Emergency Use Authorization (EUA). This EUA will remain  in effect (meaning this test can be used) for the duration of the COVID-19 declaration under Section 56 4(b)(1) of the Act, 21 U.S.C. section 360bbb-3(b)(1), unless the authorization is terminated or revoked sooner. Performed at [facility], [place] [address], [place], [place] [address]          Radiology Studies: MR BRAIN W WO CONTRAST  Result Date: 04/15/2019 CLINICAL DATA:  History of testicular cancer. Assess for metastatic disease. EXAM: MRI HEAD WITHOUT AND WITH CONTRAST TECHNIQUE: Multiplanar, multiecho pulse sequences of the brain and surrounding structures were obtained without and with intravenous contrast. CONTRAST:  81m GADAVIST GADOBUTROL 1 MMOL/ML IV SOLN COMPARISON:  None. FINDINGS: Brain: The brain has a normal appearance without evidence of malformation,  atrophy, old or acute small or large vessel infarction, mass lesion, hemorrhage, hydrocephalus or extra-axial collection. After contrast administration, no abnormal enhancement occurs. Vascular: Major vessels at the base of the brain show flow. Venous sinuses appear patent. Skull and upper cervical spine: Normal. Sinuses/Orbits: Clear/normal. Other: None significant. IMPRESSION: Normal examination.  No evidence of metastatic disease. Electronically Signed   By: Nelson Chimes M.D.   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 Patient has experienced minimal improvement since initiation of IV heparin, and as such request made for initiation of catheter directed thrombolysis for symptomatic purposes. EXAM: 1. ULTRASOUND GUIDANCE FOR VASCULAR ACCESS X2 2. FLUOROSCOPIC GUIDED PHARMACOLOGICAL AND MECHANICAL THROMBECTOMY WITH ANGIOJET DEVICE 3. BILATERAL LOWER EXTREMITY VENOGRAM AND PLACEMENT OF INFUSION CATHETERS COMPARISON:  CT chest, abdomen pelvis-04/11/2019; abdominal MRV - 04/13/2019 MEDICATIONS: Hydralazine 10 mg IV, administered near the completion of the procedure secondary to persistently elevated blood pressure. CONTRAST:  60 cc Omnipaque 300 ANESTHESIA/SEDATION: Moderate (conscious) sedation was employed during this procedure. A total of Versed 3 mg and Fentanyl 125 mcg was administered intravenously. Moderate Sedation Time: 67 minutes. The patient's level of consciousness and vital signs were monitored continuously by radiology nursing throughout the procedure under my direct supervision. FLUOROSCOPY TIME:  18 minutes, 18 seconds (675 mGy) COMPLICATIONS: None immediate. TECHNIQUE: Informed written consent was obtained from the patient after a discussion of the risks (including but not limited to bleeding, including nontarget bleeding given history of malignancy, infection, pulmonary embolism and necessity for venous angioplasty and/or stent placement), benefits and alternatives to treatment. Questions regarding the procedure were encouraged and answered. A timeout was performed prior to the initiation of the procedure. The patient was placed prone on the fluoroscopy table and the skin posterior to the bilateral knees was prepped and draped in the usual sterile fashion, and a sterile drape was applied covering the operative field. Maximum barrier sterile technique with  sterile gowns and gloves were used for the procedure. Under direct ultrasound guidance, the left popliteal vein was access with a micropuncture kit after the overlying soft tissues were anesthetized with 1% lidocaine. An ultrasound image was saved for documentation purposed. This allowed for placement of a 8-French vascular sheath. A limited venogram was performed through the side arm of the vascular sheath. With the use of a regular glidewire, a Kumpe catheter was advanced through the left femoral, external iliac vein and through the left common iliac vein to the level of the IVC. Limited vena cavogram demonstrates complete occlusion of the distal aspect of the IVC extending to involve both left common iliac veins. Ultimately, a moderate to long segment apparently extrinsic malignant narrowing involving the mid/distal aspect of the IVC was successfully traversed with inferior venogram demonstrating patency of the more cranial infrarenal IVC. Next, with the use of a Angiojet device, approximately 250 cc of a solution of 10 mg of tPA mixed in 500 mg of saline was infused throughout the occluded segment of the IVC, left common and external iliac veins to the level of the left common femoral vein. Attention was now paid towards access of the right lower extremity popliteal vein. After the overlying soft tissues were anesthetized with 1% lidocaine, the right popliteal vein was accessed micropuncture kit. Limited venogram was performed through the outer micro puncture sheath. This allowed for placement  of a 6 [place], 35 cm vascular sheath. Again, with the use of a regular glidewire, a Kumpe catheter was advanced beyond the obstructing caval lesion. Contrast injection confirmed appropriate positioning. Next, after approximately 20 minute tPA dwell, mechanical thrombectomy was performed with the Angiojet device both from the left lower extremity access as well as the right lower extremity access. Limited vena cavagram was  performed via the right lower extremity access. Kumpe catheter was utilized for measurement purposes and ultimately a 90 cm/20-cm infusion catheter was placed from the right lower extremity access and a 90 cm/30 cm infusion catheter was placed from the left lower extremity access adequately covering the occluded segment of the bilateral venous systems as well as the caudal aspect of the IVC. Postprocedural spot fluoroscopic images were obtained. Both popliteal vascular sheath were secured in place. Dressings were applied. Patient's was subjectively cold by the end of the procedure with associated elevation in his blood pressure and as such he was administered 10 mg of hydralazine and bear hugger warming device was applied. The patient otherwise tolerated the procedure well without immediate postprocedural complication. FINDINGS: Sonographic evaluation demonstrates patency of the bilateral popliteal veins. Contrast injection demonstrates patency of the bilateral popliteal and superficial femoral veins. There is occlusive thrombus extending from the left common femoral vein and the right common iliac vein through the mid/caudal aspect of the IVC secondary to a moderate to long segment length presumably extrinsic malignant occlusion secondary to known adjacent bulky retroperitoneal lymphadenopathy. Following pharmacological and mechanical thrombectomy as detailed above, there is restored flow as demonstrated on limited right central pelvic venogram with large amount of residual thrombus. Following infusion catheter placement, both side ports traverse both occluded segments of the IVC and pelvic venous systems. IMPRESSION: 1. 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 2. Successful initiation of bilateral mechanical and pharmacologic thrombectomy with bilateral infusion catheters appropriately positioned.  PLAN: The patient will be transferred to ICU for overnight lytic infusion and will return tomorrow for repeat venogram and potential intervention. Electronically Signed   By: Sandi Mariscal M.D.   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 Patient has experienced minimal improvement since initiation of IV heparin, and as such request made for initiation of catheter directed thrombolysis for symptomatic purposes. EXAM: 1. ULTRASOUND GUIDANCE FOR VASCULAR ACCESS X2 2. FLUOROSCOPIC GUIDED PHARMACOLOGICAL AND MECHANICAL THROMBECTOMY WITH ANGIOJET DEVICE 3. BILATERAL LOWER EXTREMITY VENOGRAM AND PLACEMENT OF INFUSION CATHETERS COMPARISON:  CT chest, abdomen pelvis-04/11/2019; abdominal MRV - 04/13/2019 MEDICATIONS: Hydralazine 10 mg IV, administered near the completion of the procedure secondary to persistently elevated blood pressure. CONTRAST:  60 cc Omnipaque 300 ANESTHESIA/SEDATION: Moderate (conscious) sedation was employed during this procedure. A total of Versed 3 mg and Fentanyl 125 mcg was administered intravenously. Moderate Sedation Time: 67 minutes. The patient's level of consciousness and vital signs were monitored continuously by radiology nursing throughout the procedure under my direct supervision. FLUOROSCOPY TIME:  18 minutes, 18 seconds (993 mGy) COMPLICATIONS: None immediate. TECHNIQUE: Informed written consent was obtained from the patient after a discussion of the risks (including but not limited to bleeding, including nontarget bleeding given history of malignancy, infection, pulmonary embolism and necessity for venous angioplasty and/or stent placement),  benefits and alternatives to treatment. Questions regarding the procedure were encouraged and answered. A timeout  was performed prior to the initiation of the procedure. The patient was placed prone on the fluoroscopy table and the skin posterior to the bilateral knees was prepped and draped in the usual sterile fashion, and a sterile drape was applied covering the operative field. Maximum barrier sterile technique with sterile gowns and gloves were used for the procedure. Under direct ultrasound guidance, the left popliteal vein was access with a micropuncture kit after the overlying soft tissues were anesthetized with 1% lidocaine. An ultrasound image was saved for documentation purposed. This allowed for placement of a 8-French vascular sheath. A limited venogram was performed through the side arm of the vascular sheath. With the use of a regular glidewire, a Kumpe catheter was advanced through the left femoral, external iliac vein and through the left common iliac vein to the level of the IVC. Limited vena cavogram demonstrates complete occlusion of the distal aspect of the IVC extending to involve both left common iliac veins. Ultimately, a moderate to long segment apparently extrinsic malignant narrowing involving the mid/distal aspect of the IVC was successfully traversed with inferior venogram demonstrating patency of the more cranial infrarenal IVC. Next, with the use of a Angiojet device, approximately 250 cc of a solution of 10 mg of tPA mixed in 500 mg of saline was infused throughout the occluded segment of the IVC, left common and external iliac veins to the level of the left common femoral vein. Attention was now paid towards access of the right lower extremity popliteal vein. After the overlying soft tissues were anesthetized with 1% lidocaine, the right popliteal vein was accessed micropuncture kit. Limited venogram was performed through the outer micro puncture sheath. This allowed for placement of a 6 [place], 35 cm vascular sheath. Again, with the use of a regular glidewire, a Kumpe catheter was advanced  beyond the obstructing caval lesion. Contrast injection confirmed appropriate positioning. Next, after approximately 20 minute tPA dwell, mechanical thrombectomy was performed with the Angiojet device both from the left lower extremity access as well as the right lower extremity access. Limited vena cavagram was performed via the right lower extremity access. Kumpe catheter was utilized for measurement purposes and ultimately a 90 cm/20-cm infusion catheter was placed from the right lower extremity access and a 90 cm/30 cm infusion catheter was placed from the left lower extremity access adequately covering the occluded segment of the bilateral venous systems as well as the caudal aspect of the IVC. Postprocedural spot fluoroscopic images were obtained. Both popliteal vascular sheath were secured in place. Dressings were applied. Patient's was subjectively cold by the end of the procedure with associated elevation in his blood pressure and as such he was administered 10 mg of hydralazine and bear hugger warming device was applied. The patient otherwise tolerated the procedure well without immediate postprocedural complication. FINDINGS: Sonographic evaluation demonstrates patency of the bilateral popliteal veins. Contrast injection demonstrates patency of the bilateral popliteal and superficial femoral veins. There is occlusive thrombus extending from the left common femoral vein and the right common iliac vein through the mid/caudal aspect of the IVC secondary to a moderate to long segment length presumably extrinsic malignant occlusion secondary to known adjacent bulky retroperitoneal lymphadenopathy. Following pharmacological and mechanical thrombectomy as detailed above, there is restored flow as demonstrated on limited right central pelvic venogram with large amount of residual thrombus. Following infusion catheter placement, both side ports traverse both occluded segments  of the IVC and pelvic venous systems.  IMPRESSION: 1. 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 2. Successful initiation of bilateral mechanical and pharmacologic thrombectomy with bilateral infusion catheters appropriately positioned. PLAN: The patient will be transferred to ICU for overnight lytic infusion and will return tomorrow for repeat venogram and potential intervention. Electronically Signed   By: Sandi Mariscal M.D.   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 Patient has experienced minimal improvement since initiation of IV heparin, and as such request made for initiation of catheter directed thrombolysis for symptomatic purposes. EXAM: 1. ULTRASOUND GUIDANCE FOR VASCULAR ACCESS X2 2. FLUOROSCOPIC GUIDED PHARMACOLOGICAL AND MECHANICAL THROMBECTOMY WITH ANGIOJET DEVICE 3. BILATERAL LOWER EXTREMITY VENOGRAM AND PLACEMENT OF INFUSION CATHETERS COMPARISON:  CT chest, abdomen pelvis-04/11/2019; abdominal MRV - 04/13/2019 MEDICATIONS: Hydralazine 10 mg IV, administered near the completion of the procedure secondary to persistently elevated blood pressure. CONTRAST:  60 cc Omnipaque 300 ANESTHESIA/SEDATION: Moderate (conscious) sedation was employed during this procedure. A total of Versed 3 mg and Fentanyl 125 mcg was administered intravenously. Moderate Sedation Time: 67 minutes. The patient's level of consciousness and vital signs were monitored continuously by radiology nursing throughout the procedure under my direct supervision. FLUOROSCOPY TIME:  18 minutes, 18 seconds (542 mGy) COMPLICATIONS: None immediate. TECHNIQUE: Informed written consent was obtained from the patient after a  discussion of the risks (including but not limited to bleeding, including nontarget bleeding given history of malignancy, infection, pulmonary embolism and necessity for venous angioplasty and/or stent placement), benefits and alternatives to treatment. Questions regarding the procedure were encouraged and answered. A timeout was performed prior to the initiation of the procedure. The patient was placed prone on the fluoroscopy table and the skin posterior to the bilateral knees was prepped and draped in the usual sterile fashion, and a sterile drape was applied covering the operative field. Maximum barrier sterile technique with sterile gowns and gloves were used for the procedure. Under direct ultrasound guidance, the left popliteal vein was access with a micropuncture kit after the overlying soft tissues were anesthetized with 1% lidocaine. An ultrasound image was saved for documentation purposed. This allowed for placement of a 8-French vascular sheath. A limited venogram was performed through the side arm of the vascular sheath. With the use of a regular glidewire, a Kumpe catheter was advanced through the left femoral, external iliac vein and through the left common iliac vein to the level of the IVC. Limited vena cavogram demonstrates complete occlusion of the distal aspect of the IVC extending to involve both left common iliac veins. Ultimately, a moderate to long segment apparently extrinsic malignant narrowing involving the mid/distal aspect of the IVC was successfully traversed with inferior venogram demonstrating patency of the more cranial infrarenal IVC. Next, with the use of a Angiojet device, approximately 250 cc of a solution of 10 mg of tPA mixed in 500 mg of saline was infused throughout the occluded segment of the IVC, left common and external iliac veins to the level of the left common femoral vein. Attention was now paid towards access of the right lower extremity popliteal vein. After the  overlying soft tissues were anesthetized with 1% lidocaine, the right popliteal vein  was accessed micropuncture kit. Limited venogram was performed through the outer micro puncture sheath. This allowed for placement of a 6 [place], 35 cm vascular sheath. Again, with the use of a regular glidewire, a Kumpe catheter was advanced beyond the obstructing caval lesion. Contrast injection confirmed appropriate positioning. Next, after approximately 20 minute tPA dwell, mechanical thrombectomy was performed with the Angiojet device both from the left lower extremity access as well as the right lower extremity access. Limited vena cavagram was performed via the right lower extremity access. Kumpe catheter was utilized for measurement purposes and ultimately a 90 cm/20-cm infusion catheter was placed from the right lower extremity access and a 90 cm/30 cm infusion catheter was placed from the left lower extremity access adequately covering the occluded segment of the bilateral venous systems as well as the caudal aspect of the IVC. Postprocedural spot fluoroscopic images were obtained. Both popliteal vascular sheath were secured in place. Dressings were applied. Patient's was subjectively cold by the end of the procedure with associated elevation in his blood pressure and as such he was administered 10 mg of hydralazine and bear hugger warming device was applied. The patient otherwise tolerated the procedure well without immediate postprocedural complication. FINDINGS: Sonographic evaluation demonstrates patency of the bilateral popliteal veins. Contrast injection demonstrates patency of the bilateral popliteal and superficial femoral veins. There is occlusive thrombus extending from the left common femoral vein and the right common iliac vein through the mid/caudal aspect of the IVC secondary to a moderate to long segment length presumably extrinsic malignant occlusion secondary to known adjacent bulky retroperitoneal  lymphadenopathy. Following pharmacological and mechanical thrombectomy as detailed above, there is restored flow as demonstrated on limited right central pelvic venogram with large amount of residual thrombus. Following infusion catheter placement, both side ports traverse both occluded segments of the IVC and pelvic venous systems. IMPRESSION: 1. 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 2. Successful initiation of bilateral mechanical and pharmacologic thrombectomy with bilateral infusion catheters appropriately positioned. PLAN: The patient will be transferred to ICU for overnight lytic infusion and will return tomorrow for repeat venogram and potential intervention. Electronically Signed   By: Sandi Mariscal M.D.   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 Patient has experienced minimal improvement since initiation of IV heparin, and as such request made for initiation of catheter directed thrombolysis for symptomatic purposes. EXAM: 1. ULTRASOUND GUIDANCE FOR VASCULAR ACCESS X2 2. FLUOROSCOPIC GUIDED PHARMACOLOGICAL AND MECHANICAL THROMBECTOMY WITH ANGIOJET DEVICE 3. BILATERAL LOWER EXTREMITY VENOGRAM AND PLACEMENT OF INFUSION CATHETERS COMPARISON:  CT chest, abdomen pelvis-04/11/2019; abdominal MRV - 04/13/2019 MEDICATIONS: Hydralazine 10 mg IV, administered near the completion of the procedure secondary to persistently elevated blood pressure. CONTRAST:  60 cc Omnipaque 300 ANESTHESIA/SEDATION: Moderate (conscious) sedation was employed during this procedure. A total of Versed 3 mg and Fentanyl 125 mcg was administered intravenously. Moderate Sedation  Time: 67 minutes. The patient's level of consciousness and vital signs were monitored continuously by radiology nursing throughout the procedure under my direct supervision. FLUOROSCOPY TIME:  18 minutes, 18 seconds (557 mGy) COMPLICATIONS: None immediate. TECHNIQUE: Informed written consent was obtained from the patient after a discussion of the risks (including but not  limited to bleeding, including nontarget bleeding given history of malignancy, infection, pulmonary embolism and necessity for venous angioplasty and/or stent placement), benefits and alternatives to treatment. Questions regarding the procedure were encouraged and answered. A timeout was performed prior to the initiation of the procedure. The patient was placed prone on the fluoroscopy table and the skin posterior to the bilateral knees was prepped and draped in the usual sterile fashion, and a sterile drape was applied covering the operative field. Maximum barrier sterile technique with sterile gowns and gloves were used for the procedure. Under direct ultrasound guidance, the left popliteal vein was access with a micropuncture kit after the overlying soft tissues were anesthetized with 1% lidocaine. An ultrasound image was saved for documentation purposed. This allowed for placement of a 8-French vascular sheath. A limited venogram was performed through the side arm of the vascular sheath. With the use of a regular glidewire, a Kumpe catheter was advanced through the left femoral, external iliac vein and through the left common iliac vein to the level of the IVC. Limited vena cavogram demonstrates complete occlusion of the distal aspect of the IVC extending to involve both left common iliac veins. Ultimately, a moderate to long segment apparently extrinsic malignant narrowing involving the mid/distal aspect of the IVC was successfully traversed with inferior venogram demonstrating patency of the more cranial infrarenal IVC. Next, with the use of a  Angiojet device, approximately 250 cc of a solution of 10 mg of tPA mixed in 500 mg of saline was infused throughout the occluded segment of the IVC, left common and external iliac veins to the level of the left common femoral vein. Attention was now paid towards access of the right lower extremity popliteal vein. After the overlying soft tissues were anesthetized with 1% lidocaine, the right popliteal vein was accessed micropuncture kit. Limited venogram was performed through the outer micro puncture sheath. This allowed for placement of a 6 French, 35 cm vascular sheath. Again, with the use of a regular glidewire, a Kumpe catheter was advanced beyond the obstructing caval lesion. Contrast injection confirmed appropriate positioning. Next, after approximately 20 minute tPA dwell, mechanical thrombectomy was performed with the Angiojet device both from the left lower extremity access as well as the right lower extremity access. Limited vena cavagram was performed via the right lower extremity access. Kumpe catheter was utilized for measurement purposes and ultimately a 90 cm/20-cm infusion catheter was placed from the right lower extremity access and a 90 cm/30 cm infusion catheter was placed from the left lower extremity access adequately covering the occluded segment of the bilateral venous systems as well as the caudal aspect of the IVC. Postprocedural spot fluoroscopic images were obtained. Both popliteal vascular sheath were secured in place. Dressings were applied. Patient's was subjectively cold by the end of the procedure with associated elevation in his blood pressure and as such he was administered 10 mg of hydralazine and bear hugger warming device was applied. The patient otherwise tolerated the procedure well without immediate postprocedural complication. FINDINGS: Sonographic evaluation demonstrates patency of the bilateral popliteal veins. Contrast injection demonstrates patency of the bilateral  popliteal and superficial femoral veins. There is occlusive thrombus extending from the left common femoral vein and the right common iliac vein through the mid/caudal aspect of the IVC secondary to a moderate to long segment length presumably extrinsic malignant occlusion secondary to known adjacent bulky retroperitoneal lymphadenopathy. Following pharmacological and mechanical thrombectomy as detailed above, there is restored flow as demonstrated on limited  right central pelvic venogram with large amount of residual thrombus. Following infusion catheter placement, both side ports traverse both occluded segments of the IVC and pelvic venous systems. IMPRESSION: 1. 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 2. Successful initiation of bilateral mechanical and pharmacologic thrombectomy with bilateral infusion catheters appropriately positioned. PLAN: The patient will be transferred to ICU for overnight lytic infusion and will return tomorrow for repeat venogram and potential intervention. Electronically Signed   By: Sandi Mariscal M.D.   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 Patient has experienced minimal improvement since initiation of IV heparin, and as such request made for initiation of catheter directed thrombolysis for symptomatic purposes. EXAM: 1. ULTRASOUND GUIDANCE FOR VASCULAR ACCESS X2 2. FLUOROSCOPIC GUIDED PHARMACOLOGICAL AND MECHANICAL THROMBECTOMY WITH ANGIOJET DEVICE 3. BILATERAL LOWER EXTREMITY VENOGRAM AND PLACEMENT OF INFUSION CATHETERS COMPARISON:  CT chest, abdomen pelvis-04/11/2019; abdominal MRV - 04/13/2019 MEDICATIONS:  Hydralazine 10 mg IV, administered near the completion of the procedure secondary to persistently elevated blood pressure. CONTRAST:  60 cc Omnipaque 300 ANESTHESIA/SEDATION: Moderate (conscious) sedation was employed during this procedure. A total of Versed 3 mg and Fentanyl 125 mcg was administered intravenously. Moderate Sedation Time: 67 minutes. The patient's level of consciousness and vital signs were monitored continuously by radiology nursing throughout the procedure under my direct supervision. FLUOROSCOPY TIME:  18 minutes, 18 seconds (300 mGy) COMPLICATIONS: None immediate. TECHNIQUE: Informed written consent was obtained from the patient after a discussion of the risks (including but not limited to bleeding, including nontarget bleeding given history of malignancy, infection, pulmonary embolism and necessity for venous angioplasty and/or stent placement), benefits and alternatives to treatment. Questions regarding the procedure were encouraged and answered. A timeout was performed prior to the initiation of the procedure. The patient was placed prone on the fluoroscopy table and the skin posterior to the bilateral knees was prepped and draped in the usual sterile fashion, and a sterile drape was applied covering the operative field. Maximum barrier sterile technique with sterile gowns and gloves were used for the procedure. Under direct ultrasound guidance, the left popliteal vein was access with a micropuncture kit after the overlying soft tissues were anesthetized with 1% lidocaine. An ultrasound image was saved for documentation purposed. This allowed for placement of a 8-French vascular sheath. A limited venogram was performed through the side arm of the vascular sheath. With the use of a regular glidewire, a Kumpe catheter was advanced through the left femoral, external iliac vein and through the left common iliac vein to the level of the IVC. Limited vena cavogram demonstrates complete occlusion of  the distal aspect of the IVC extending to involve both left common iliac veins. Ultimately, a moderate to long segment apparently extrinsic malignant narrowing involving the mid/distal aspect of the IVC was successfully traversed with inferior venogram demonstrating patency of the more cranial infrarenal IVC. Next, with the use of a Angiojet device, approximately 250 cc of a solution of 10 mg of tPA mixed in 500 mg of saline was infused throughout the occluded segment of the IVC, left common and external iliac veins to the level of the left common femoral vein. Attention was now paid towards access of  the right lower extremity popliteal vein. After the overlying soft tissues were anesthetized with 1% lidocaine, the right popliteal vein was accessed micropuncture kit. Limited venogram was performed through the outer micro puncture sheath. This allowed for placement of a 6 French, 35 cm vascular sheath. Again, with the use of a regular glidewire, a Kumpe catheter was advanced beyond the obstructing caval lesion. Contrast injection confirmed appropriate positioning. Next, after approximately 20 minute tPA dwell, mechanical thrombectomy was performed with the Angiojet device both from the left lower extremity access as well as the right lower extremity access. Limited vena cavagram was performed via the right lower extremity access. Kumpe catheter was utilized for measurement purposes and ultimately a 90 cm/20-cm infusion catheter was placed from the right lower extremity access and a 90 cm/30 cm infusion catheter was placed from the left lower extremity access adequately covering the occluded segment of the bilateral venous systems as well as the caudal aspect of the IVC. Postprocedural spot fluoroscopic images were obtained. Both popliteal vascular sheath were secured in place. Dressings were applied. Patient's was subjectively cold by the end of the procedure with associated elevation in his blood pressure and as  such he was administered 10 mg of hydralazine and bear hugger warming device was applied. The patient otherwise tolerated the procedure well without immediate postprocedural complication. FINDINGS: Sonographic evaluation demonstrates patency of the bilateral popliteal veins. Contrast injection demonstrates patency of the bilateral popliteal and superficial femoral veins. There is occlusive thrombus extending from the left common femoral vein and the right common iliac vein through the mid/caudal aspect of the IVC secondary to a moderate to long segment length presumably extrinsic malignant occlusion secondary to known adjacent bulky retroperitoneal lymphadenopathy. Following pharmacological and mechanical thrombectomy as detailed above, there is restored flow as demonstrated on limited right central pelvic venogram with large amount of residual thrombus. Following infusion catheter placement, both side ports traverse both occluded segments of the IVC and pelvic venous systems. IMPRESSION: 1. 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 2. Successful initiation of bilateral mechanical and pharmacologic thrombectomy with bilateral infusion catheters appropriately positioned. PLAN: The patient will be transferred to ICU for overnight lytic infusion and will return tomorrow for repeat venogram and potential intervention. Electronically Signed   By: Sandi Mariscal M.D.   On: 04/15/2019 17:04        Scheduled Meds: . chlorhexidine      . Chlorhexidine Gluconate Cloth  6 each Topical Daily  . heparin      . hydrALAZINE      . HYDROmorphone      . HYDROmorphone      . lidocaine      . lidocaine-EPINEPHrine      . midazolam      . polyethylene glycol  17 g Oral Daily  . sodium chloride flush  3 mL Intravenous Q12H   Continuous Infusions: . sodium chloride 50 mL/hr at 04/16/19 1200  . alteplase  (LIMB ISCHEMIA) 10 mg in normal saline (0.02 mg/mL) infusion 0.25 mg/hr (04/16/19 1200)  . heparin 2,200 Units/hr (04/16/19 1200)     LOS: 5 days    Time spent: 30 minutes    Blain Pais, MD [facility] Pager [phone]

## 2019-04-16 NOTE — Progress Notes (Signed)
Dr Pascal Lux (IR) called and notified of Pt's feet being extremely cold and the difficulty to dopple DP and PT pulses. Attempted by 3 RNs and eventually locating them faintly. No new orders received continue to monitor and assess.

## 2019-04-16 NOTE — Progress Notes (Signed)
ANTICOAGULATION CONSULT NOTE Pharmacy Consult for Heparin Indication: DVT  No Known Allergies  Patient Measurements: Height: 6\' 3"  (190.5 cm) Weight: 200 lb (90.7 kg) IBW/kg (Calculated) : 84.5  Vital Signs: Temp: 99.1 F (37.3 C) (01/31 1936) Temp Source: Oral (01/31 1936) BP: 129/81 (01/31 2000) Pulse Rate: 123 (01/31 1900)  Labs: Recent Labs    04/15/19 0751 04/15/19 1805 04/15/19 2317 04/15/19 2317 04/16/19 0450 04/16/19 1202 04/16/19 1939  HGB 13.5   < > 12.2*   < > 12.8* 12.7*  --   HCT 41.1   < > 36.0*  --  38.6* 39.2  --   PLT 265   < > 231  --  220 225  --   HEPARINUNFRC 0.48   < > 0.23*   < > 0.37 0.59 0.53  CREATININE 1.00  --  1.27*  --   --   --   --    < > = values in this interval not displayed.    Estimated Creatinine Clearance: 96.1 mL/min (A) (by C-G formula based on SCr of 1.27 mg/dL (H)).   Assessment: 39 yo M with DVT s/p thrombectomy and lysis with alteplase and stenting of IVC.  Currently on heparin drip 2200 uts/hr with HL 0.53 at goal. No bleeding post procedures.   Goal of Therapy: Heparin level 0.3-0.7 units/ml Monitor platelets by anticoagulation protocol: Yes  Plan: Cont heparin 2200 units/hr F/u plans for oral AC   Bonnita Nasuti Pharm.D. CPP, BCPS Clinical Pharmacist [phone] 04/16/2019 8:52 PM

## 2019-04-16 NOTE — Procedures (Signed)
Pre procedural Dx: Recurrent metastatic testicular cancer, now with retroperitoneal lymphadenopathy, IVC occlusion and bilateral pelvic DVT Post procedural Dx: Same  Technically successful completion of mechanical and pharmacological thrombolysis requiring overlapping stenting of the IVC for resistant malignant narrowing.  Access: Bilateral popliteal veins - keep bilateral legs straight for 2 hours (until 1700)  EBL: Minimal Complications: None immediate  Ronny Bacon, MD Pager #: [phone]

## 2019-04-16 NOTE — Progress Notes (Signed)
ANTICOAGULATION CONSULT NOTE Pharmacy Consult for Heparin Indication: DVT  No Known Allergies  Patient Measurements: Height: 6\' 3"  (190.5 cm) Weight: 200 lb (90.7 kg) IBW/kg (Calculated) : 84.5  Vital Signs: Temp: 100.7 F (38.2 C) (01/30 1900) Temp Source: Oral (01/30 1900) BP: 140/95 (01/31 0000) Pulse Rate: 98 (01/31 0023)  Labs: Recent Labs    04/15/19 0751 04/15/19 0751 04/15/19 1805 04/15/19 2029 04/15/19 2317  HGB 13.5   < > 13.7  --  12.2*  HCT 41.1  --  39.9  --  36.0*  PLT 265  --  257  --  231  HEPARINUNFRC 0.48  --   --  0.21* 0.23*  CREATININE 1.00  --   --   --  1.27*   < > = values in this interval not displayed.    Estimated Creatinine Clearance: 96.1 mL/min (A) (by C-G formula based on SCr of 1.27 mg/dL (H)).   Assessment: 39 yo M with DVT s/p thrombectomy and lysis on alteplase, for heparin  Goal of Therapy: Heparin level 0.3-0.7 units/ml Monitor platelets by anticoagulation protocol: Yes  Plan: Increase Heparin 2200 units/hr Follow-up am labs.  Phillis Knack, PharmD, BCPS

## 2019-04-17 ENCOUNTER — Encounter (HOSPITAL_COMMUNITY): Payer: Self-pay

## 2019-04-17 ENCOUNTER — Inpatient Hospital Stay (HOSPITAL_COMMUNITY): Payer: BLUE CROSS/BLUE SHIELD

## 2019-04-17 DIAGNOSIS — I82402 Acute embolism and thrombosis of unspecified deep veins of left lower extremity: Secondary | ICD-10-CM

## 2019-04-17 DIAGNOSIS — R7989 Other specified abnormal findings of blood chemistry: Secondary | ICD-10-CM

## 2019-04-17 DIAGNOSIS — I82462 Acute embolism and thrombosis of left calf muscular vein: Secondary | ICD-10-CM

## 2019-04-17 LAB — URINALYSIS, ROUTINE W REFLEX MICROSCOPIC

## 2019-04-17 LAB — CBC WITH DIFFERENTIAL/PLATELET
Abs Immature Granulocytes: 0.09 10*3/uL — ABNORMAL HIGH (ref 0.00–0.07)
Basophils Absolute: 0 10*3/uL (ref 0.0–0.1)
Basophils Relative: 0 %
Eosinophils Absolute: 0.1 10*3/uL (ref 0.0–0.5)
Eosinophils Relative: 0 %
HCT: 32.8 % — ABNORMAL LOW (ref 39.0–52.0)
Hemoglobin: 11 g/dL — ABNORMAL LOW (ref 13.0–17.0)
Immature Granulocytes: 1 %
Lymphocytes Relative: 8 %
Lymphs Abs: 0.9 10*3/uL (ref 0.7–4.0)
MCH: 29.8 pg (ref 26.0–34.0)
MCHC: 33.5 g/dL (ref 30.0–36.0)
MCV: 88.9 fL (ref 80.0–100.0)
Monocytes Absolute: 1.4 10*3/uL — ABNORMAL HIGH (ref 0.1–1.0)
Monocytes Relative: 12 %
Neutro Abs: 9 10*3/uL — ABNORMAL HIGH (ref 1.7–7.7)
Neutrophils Relative %: 79 %
Platelets: 215 10*3/uL (ref 150–400)
RBC: 3.69 MIL/uL — ABNORMAL LOW (ref 4.22–5.81)
RDW: 13.4 % (ref 11.5–15.5)
WBC: 11.4 10*3/uL — ABNORMAL HIGH (ref 4.0–10.5)
nRBC: 0 % (ref 0.0–0.2)

## 2019-04-17 LAB — POCT ACTIVATED CLOTTING TIME: Activated Clotting Time: 175 seconds

## 2019-04-17 LAB — BASIC METABOLIC PANEL
Anion gap: 6 (ref 5–15)
BUN: 8 mg/dL (ref 6–20)
CO2: 24 mmol/L (ref 22–32)
Calcium: 7.8 mg/dL — ABNORMAL LOW (ref 8.9–10.3)
Chloride: 100 mmol/L (ref 98–111)
Creatinine, Ser: 1.02 mg/dL (ref 0.61–1.24)
GFR calc Af Amer: >60 mL/min (ref >60)
GFR calc non Af Amer: >60 mL/min (ref >60)
Glucose, Bld: 110 mg/dL — ABNORMAL HIGH (ref 70–99)
Potassium: 3.9 mmol/L (ref 3.5–5.1)
Sodium: 130 mmol/L — ABNORMAL LOW (ref 135–145)

## 2019-04-17 LAB — URINALYSIS, MICROSCOPIC (REFLEX)
RBC / HPF: >50 RBC/hpf (ref 0–5)
Squamous Epithelial / HPF: NONE SEEN (ref 0–5)

## 2019-04-17 LAB — HEPARIN LEVEL (UNFRACTIONATED): Heparin Unfractionated: 0.53 IU/mL (ref 0.30–0.70)

## 2019-04-17 NOTE — Progress Notes (Signed)
Discussed with Rodena Piety MD about patient's tachycardia, fever, and need for foley catheter. Will keep foley for now d/t retention prior to insertion. Awaiting new orders.

## 2019-04-17 NOTE — Progress Notes (Addendum)
HEMATOLOGY-ONCOLOGY PROGRESS NOTE  SUBJECTIVE: He is having increased pain at his left lower back near the prior biopsy site.  Developed urinary retention over the weekend and has a Foley catheter in place.  He notices that his urine is bloody.  He had a fever earlier today which has now resolved.  Blood cultures were obtained.  Urinalysis and urine culture have been sent.  Has not been out of bed or ambulated.  REVIEW OF SYSTEMS:   Constitutional: He had fevers earlier today which have now resolved Respiratory: Denies cough, dyspnea or wheezes Cardiovascular: Denies palpitation, chest discomfort Gastrointestinal:  Denies nausea, heartburn or change in bowel habits Skin: Denies abnormal skin rashes Lymphatics: Denies new lymphadenopathy or easy bruising Neurological:Denies numbness, tingling or new weaknesses Behavioral/Psych: Mood is stable, no new changes  Extremities: Left lower extremity edema significantly improved All other systems were reviewed with the patient and are negative.  I have reviewed the past medical history, past surgical history, social history and family history with the patient and they are unchanged from previous note.   PHYSICAL EXAMINATION:  Vitals:   04/17/19 1300 04/17/19 1400  BP: 115/71 132/72  Pulse: (!) 128 (!) 132  Resp: 19 (!) 23  Temp:    SpO2: 97% 100%   Filed Weights   04/10/19 2005  Weight: 200 lb (90.7 kg)    Intake/Output from previous day: 01/31 0701 - 02/01 0700 In: 4592.1 [P.O.:2760; I.V.:1832.1] Out: 0123 [Urine:3555]  GENERAL:alert, no distress and comfortable SKIN: skin color, texture, turgor are normal, no rashes or significant lesions EYES: normal, Conjunctiva are pink and non-injected, sclera clear OROPHARYNX:no exudate, no erythema and lips, buccal mucosa, and tongue normal  NECK: supple, thyroid normal size, non-tender, without nodularity LYMPH:  no palpable lymphadenopathy in the cervical, axillary or inguinal LUNGS:  clear to auscultation and percussion with normal breathing effort HEART: regular rate & rhythm and no murmurs.  Left lower extremity edema significantly improved, less redness and tenderness ABDOMEN:abdomen soft, non-tender and normal bowel sounds Musculoskeletal:no cyanosis of digits and no clubbing  NEURO: alert & oriented x 3 with fluent speech, no focal motor/sensory deficits  LABORATORY DATA:  I have reviewed the data as listed CMP Latest Ref Rng & Units 04/17/2019 04/15/2019 04/15/2019  Glucose 70 - 99 mg/dL 110(H) 102(H) 95  BUN 6 - 20 mg/dL _0 Creatinine 0.61 - 1.24 mg/dL 1.02 1.27(H) 1.00  Sodium 135 - 145 mmol/L 130(L) 133(L) 134(L)  Potassium 3.5 - 5.1 mmol/L 3.9 4.0 4.1  Chloride 98 - 111 mmol/L 100 98 96(L)  CO2 22 - 32 mmol/L _1 Calcium 8.9 - 10.3 mg/dL 7.8(L) 8.6(L) 8.9  Total Protein 6.5 - 8.1 g/dL - - -  Total Bilirubin 0.3 - 1.2 mg/dL - - -  Alkaline Phos 38 - 126 U/L - - -  AST 15 - 41 U/L - - -  ALT 0 - 44 U/L - - -    Lab Results  Component Value Date   WBC 11.4 (H) 04/17/2019   HGB 11.0 (L) 04/17/2019   HCT 32.8 (L) 04/17/2019   MCV 88.9 04/17/2019   PLT 215 04/17/2019   NEUTROABS 9.0 (H) 04/17/2019    CT Angio Chest PE W and/or Wo Contrast  Result Date: 04/11/2019 CLINICAL DATA:  Positive lower extremity DVT, PE suspected. Back pain and leg swelling, history of testicular cancer. EXAM: CT ANGIOGRAPHY CHEST CT ABDOMEN AND PELVIS WITH CONTRAST TECHNIQUE: Multidetector CT imaging of the chest was  performed using the standard protocol during bolus administration of intravenous contrast. Multiplanar CT image reconstructions and MIPs were obtained to evaluate the vascular anatomy. Multidetector CT imaging of the abdomen and pelvis was performed using the standard protocol during bolus administration of intravenous contrast. CONTRAST:  1103m OMNIPAQUE IOHEXOL 350 MG/ML SOLN COMPARISON:  None. FINDINGS: CTA CHEST FINDINGS Cardiovascular: Suboptimal  opacification of the pulmonary arteries limits evaluation beyond the lobar level. No large central or lobar pulmonary arterial filling defects are identified. Central pulmonary arteries are normal caliber. Normal cardiac size. 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 The aorta is normal caliber. Shared origin of the brachiocephalic and left common carotid arteries. Great vessels are otherwise unremarkable. Mediastinum/Nodes: No enlarged mediastinal, hilar or axillary adenopathy is seen. No acute abnormality of the trachea or esophagus. Thyroid gland is normal. Asymmetric thickening of the left sternocleidomastoid is likely related to muscle activation. Lungs/Pleura: Respiratory motion artifact is mild. No consolidation, features of edema, pneumothorax, or effusion. No suspicious pulmonary nodules or masses. Musculoskeletal: No acute osseous abnormality or suspicious osseous lesion in the imaged chest. No worrisome chest wall lesions. Mild bilateral gynecomastia is noted, right slightly greater left. Review of the MIP images confirms the above findings. CT ABDOMEN and PELVIS FINDINGS Hepatobiliary: No focal liver abnormality is seen. No gallstones, gallbladder wall thickening, or biliary dilatation. Pancreas: Unremarkable. No pancreatic ductal dilatation or surrounding inflammatory changes. Spleen: Normal in size without focal abnormality. Adrenals/Urinary Tract: Adrenal glands are unremarkable. Kidneys are normal, without renal calculi, focal lesion, or hydronephrosis. Bladder is unremarkable. Stomach/Bowel: Distal esophagus, stomach and duodenal sweep are unremarkable. No small bowel wall thickening or dilatation. No evidence of obstruction. A normal appendix is visualized. No colonic dilatation or wall thickening. Scattered colonic diverticula without focal pericolonic inflammation to suggest diverticulitis. Vascular/Lymphatic: There is  extensive retroperitoneal and pelvic adenopathy, some of the larger nodes include a left periaortic node measuring up to 3.2 cm (2/43) and a 2 cm aortocaval lymph node (2/35) there is distal abdominal aortic encasement by a extensive retroperitoneal adenopathy. Furthermore there is likely direct compression and/or direct nodal involvement of the inferior vena cava (2/40 with the lower IVC appearing expanded by hypoattenuating filling defects. Edematous changes are noted throughout the pelvis with venous engorgement and collateralization. Reproductive: The prostate and seminal vesicles are unremarkable. There is a varicocele seen in the left scrotum. External genitalia are incompletely included on this exam. Other: There is circumferential body wall edema from the level of the pelvis inferiorly more pronounced throughout the left lower extremity. Free fluid is seen in the deep pelvis, likely reactive or secondary to venous occlusion with developing collateralization. Musculoskeletal: No acute osseous abnormality or suspicious osseous lesion. Normal symmetric muscular enhancement. Review of the MIP images confirms the above findings. IMPRESSION: 1. Suboptimal opacification of the pulmonary arteries limits evaluation beyond the lobar level. No large central or lobar pulmonary arterial filling defects are identified. 2. Extensive retroperitoneal and pelvic adenopathy. There is likely direct compression of the lower IVC with the distal IVC and iliac veins appearing expanded by hypoattenuating thrombus. There is complete encasement of the abdominal aorta as well but without luminal narrowing. Findings are highly concerning for metastatic disease, particularly given patient history of testicular cancer. 3. Left scrotal varicocele. External genitalia are incompletely included on this exam. Consider further evaluation scrotal ultrasound given extensive retroperitoneal adenopathy and patient's history of testicular cancer. 4.  Free  fluid in the deep pelvis is likely reactive or secondary to venous occlusion with developing collateralization. 5. Colonic diverticulosis without evidence of diverticulitis. 6. Mild bilateral gynecomastia, right slightly greater left. 7. 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 Critical Value/emergent results were called by telephone at the time of interpretation on 04/11/2019 at 1:33 am to [place] , who verbally acknowledged these results. Electronically Signed   By: Lovena Le M.D.   On: 04/11/2019 01:33   MR BRAIN W WO CONTRAST  Result Date: 04/15/2019 CLINICAL DATA:  History of testicular cancer. Assess for metastatic disease. EXAM: MRI HEAD WITHOUT AND WITH CONTRAST TECHNIQUE: Multiplanar, multiecho pulse sequences of the brain and surrounding structures were obtained without and with intravenous contrast. CONTRAST:  55m GADAVIST GADOBUTROL 1 MMOL/ML IV SOLN COMPARISON:  None. FINDINGS: Brain: The brain has a normal appearance without evidence of malformation, atrophy, old or acute small or large vessel infarction, mass lesion, hemorrhage, hydrocephalus or extra-axial collection. After contrast administration, no abnormal enhancement occurs. Vascular: Major vessels at the base of the brain show flow. Venous sinuses appear patent. Skull and upper cervical spine: Normal. Sinuses/Orbits: Clear/normal. Other: None significant. IMPRESSION: Normal examination.  No evidence of metastatic disease. Electronically Signed   By: MNelson ChimesM.D.   On: 04/15/2019 11:07   UKoreaScrotum  Result Date: 04/11/2019 CLINICAL DATA:  Concern for testicular cancer EXAM: ULTRASOUND OF SCROTUM TECHNIQUE: Complete ultrasound examination of the testicles, epididymis, and other scrotal structures was performed. COMPARISON:  None. FINDINGS: Right testicle Measurements: Surgically removed. Left testicle Measurements: 3.9 x 2.2 x 2.6 cm. No mass or  microlithiasis visualized. Right epididymis:  Not visualized Left epididymis:  Normal in size and appearance. Hydrocele:  Moderate left hydrocele Varicocele:  Moderate left varicocele. IMPRESSION: No left testicular abnormality.  Prior right orchiectomy. Moderate left hydrocele and varicocele. Electronically Signed   By: KRolm BaptiseM.D.   On: 04/11/2019 02:16   CT ABDOMEN PELVIS W CONTRAST  Result Date: 04/11/2019 CLINICAL DATA:  Positive lower extremity DVT, PE suspected. Back pain and leg swelling, history of testicular cancer. EXAM: CT ANGIOGRAPHY CHEST CT ABDOMEN AND PELVIS WITH CONTRAST TECHNIQUE: Multidetector CT imaging of the chest was performed using the standard protocol during bolus administration of intravenous contrast. Multiplanar CT image reconstructions and MIPs were obtained to evaluate the vascular anatomy. Multidetector CT imaging of the abdomen and pelvis was performed using the standard protocol during bolus administration of intravenous contrast. CONTRAST:  1031mOMNIPAQUE IOHEXOL 350 MG/ML SOLN COMPARISON:  None. FINDINGS: CTA CHEST FINDINGS Cardiovascular: Suboptimal opacification of the pulmonary arteries limits evaluation beyond the lobar level. No large central or lobar pulmonary arterial filling defects are identified. Central pulmonary arteries are normal caliber. Normal cardiac size. 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 The aorta is normal caliber. Shared origin of the brachiocephalic and left common carotid arteries. Great vessels are otherwise unremarkable. Mediastinum/Nodes: No enlarged mediastinal, hilar or axillary adenopathy is seen. No acute abnormality of the trachea or esophagus. Thyroid gland is normal. Asymmetric thickening of the left sternocleidomastoid is likely related to muscle activation. Lungs/Pleura: Respiratory motion artifact is mild. No consolidation, features of edema, pneumothorax, or  effusion. No suspicious pulmonary nodules or masses. Musculoskeletal: No acute osseous abnormality or suspicious osseous lesion in the imaged chest. No worrisome chest wall lesions. Mild  bilateral gynecomastia is noted, right slightly greater left. Review of the MIP images confirms the above findings. CT ABDOMEN and PELVIS FINDINGS Hepatobiliary: No focal liver abnormality is seen. No gallstones, gallbladder wall thickening, or biliary dilatation. Pancreas: Unremarkable. No pancreatic ductal dilatation or surrounding inflammatory changes. Spleen: Normal in size without focal abnormality. Adrenals/Urinary Tract: Adrenal glands are unremarkable. Kidneys are normal, without renal calculi, focal lesion, or hydronephrosis. Bladder is unremarkable. Stomach/Bowel: Distal esophagus, stomach and duodenal sweep are unremarkable. No small bowel wall thickening or dilatation. No evidence of obstruction. A normal appendix is visualized. No colonic dilatation or wall thickening. Scattered colonic diverticula without focal pericolonic inflammation to suggest diverticulitis. Vascular/Lymphatic: There is extensive retroperitoneal and pelvic adenopathy, some of the larger nodes include a left periaortic node measuring up to 3.2 cm (2/43) and a 2 cm aortocaval lymph node (2/35) there is distal abdominal aortic encasement by a extensive retroperitoneal adenopathy. Furthermore there is likely direct compression and/or direct nodal involvement of the inferior vena cava (2/40 with the lower IVC appearing expanded by hypoattenuating filling defects. Edematous changes are noted throughout the pelvis with venous engorgement and collateralization. Reproductive: The prostate and seminal vesicles are unremarkable. There is a varicocele seen in the left scrotum. External genitalia are incompletely included on this exam. Other: There is circumferential body wall edema from the level of the pelvis inferiorly more pronounced throughout the left  lower extremity. Free fluid is seen in the deep pelvis, likely reactive or secondary to venous occlusion with developing collateralization. Musculoskeletal: No acute osseous abnormality or suspicious osseous lesion. Normal symmetric muscular enhancement. Review of the MIP images confirms the above findings. IMPRESSION: 1. Suboptimal opacification of the pulmonary arteries limits evaluation beyond the lobar level. No large central or lobar pulmonary arterial filling defects are identified. 2. Extensive retroperitoneal and pelvic adenopathy. There is likely direct compression of the lower IVC with the distal IVC and iliac veins appearing expanded by hypoattenuating thrombus. There is complete encasement of the abdominal aorta as well but without luminal narrowing. Findings are highly concerning for metastatic disease, particularly given patient history of testicular cancer. 3. Left scrotal varicocele. External genitalia are incompletely included on this exam. Consider further evaluation scrotal ultrasound given extensive retroperitoneal adenopathy and patient's history of testicular cancer. 4. Free fluid in the deep pelvis is likely reactive or secondary to venous occlusion with developing collateralization. 5. Colonic diverticulosis without evidence of diverticulitis. 6. Mild bilateral gynecomastia, right slightly greater left. 7. 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 Critical Value/emergent results were called by telephone at the time of interpretation on 04/11/2019 at 1:33 am to [place] , who verbally acknowledged these results. Electronically Signed   By: Lovena Le M.D.   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 Patient has experienced minimal improvement since initiation of IV heparin, and as such request made for initiation of catheter directed thrombolysis for symptomatic purposes. EXAM: 1. ULTRASOUND GUIDANCE FOR VASCULAR ACCESS X2 2. FLUOROSCOPIC GUIDED PHARMACOLOGICAL AND MECHANICAL THROMBECTOMY WITH ANGIOJET DEVICE 3. BILATERAL LOWER EXTREMITY VENOGRAM AND PLACEMENT OF INFUSION CATHETERS COMPARISON:  CT chest, abdomen pelvis-04/11/2019; abdominal MRV - 04/13/2019 MEDICATIONS: Hydralazine 10 mg IV, administered near the completion of the procedure secondary to persistently elevated blood  pressure. CONTRAST:  60 cc Omnipaque 300 ANESTHESIA/SEDATION: Moderate (conscious) sedation was employed during this procedure. A total of Versed 3 mg and Fentanyl 125 mcg was administered intravenously. Moderate Sedation Time: 67 minutes. The patient's level of consciousness and vital signs were monitored continuously by radiology nursing throughout the procedure under my direct supervision. FLUOROSCOPY TIME:  18 minutes, 18 seconds (580 mGy) COMPLICATIONS: None immediate. TECHNIQUE: Informed written consent was obtained from the patient after a discussion of the risks (including but not limited to bleeding, including nontarget bleeding given history of malignancy, infection, pulmonary embolism and necessity for venous angioplasty and/or stent placement), benefits and alternatives to treatment. Questions regarding the procedure were encouraged and answered. A timeout was performed prior to the initiation of the procedure. The patient was placed prone on the fluoroscopy table and the skin posterior to the bilateral knees was prepped and draped in the usual sterile fashion, and a sterile drape was applied covering the operative field. Maximum barrier sterile technique with sterile gowns and gloves were used for the procedure. Under direct ultrasound guidance, the left popliteal vein was access with a micropuncture kit  after the overlying soft tissues were anesthetized with 1% lidocaine. An ultrasound image was saved for documentation purposed. This allowed for placement of a 8-French vascular sheath. A limited venogram was performed through the side arm of the vascular sheath. With the use of a regular glidewire, a Kumpe catheter was advanced through the left femoral, external iliac vein and through the left common iliac vein to the level of the IVC. Limited vena cavogram demonstrates complete occlusion of the distal aspect of the IVC extending to involve both left common iliac veins. Ultimately, a moderate to long segment apparently extrinsic malignant narrowing involving the mid/distal aspect of the IVC was successfully traversed with inferior venogram demonstrating patency of the more cranial infrarenal IVC. Next, with the use of a Angiojet device, approximately 250 cc of a solution of 10 mg of tPA mixed in 500 mg of saline was infused throughout the occluded segment of the IVC, left common and external iliac veins to the level of the left common femoral vein. Attention was now paid towards access of the right lower extremity popliteal vein. After the overlying soft tissues were anesthetized with 1% lidocaine, the right popliteal vein was accessed micropuncture kit. Limited venogram was performed through the outer micro puncture sheath. This allowed for placement of a 6 French, 35 cm vascular sheath. Again, with the use of a regular glidewire, a Kumpe catheter was advanced beyond the obstructing caval lesion. Contrast injection confirmed appropriate positioning. Next, after approximately 20 minute tPA dwell, mechanical thrombectomy was performed with the Angiojet device both from the left lower extremity access as well as the right lower extremity access. Limited vena cavagram was performed via the right lower extremity access. Kumpe catheter was utilized for measurement purposes and ultimately a 90 cm/20-cm infusion catheter  was placed from the right lower extremity access and a 90 cm/30 cm infusion catheter was placed from the left lower extremity access adequately covering the occluded segment of the bilateral venous systems as well as the caudal aspect of the IVC. Postprocedural spot fluoroscopic images were obtained. Both popliteal vascular sheath were secured in place. Dressings were applied. Patient's was subjectively cold by the end of the procedure with associated elevation in his blood pressure and as such he was administered 10 mg of hydralazine and bear hugger warming device was applied. The patient otherwise tolerated the procedure well  without immediate postprocedural complication. FINDINGS: Sonographic evaluation demonstrates patency of the bilateral popliteal veins. Contrast injection demonstrates patency of the bilateral popliteal and superficial femoral veins. There is occlusive thrombus extending from the left common femoral vein and the right common iliac vein through the mid/caudal aspect of the IVC secondary to a moderate to long segment length presumably extrinsic malignant occlusion secondary to known adjacent bulky retroperitoneal lymphadenopathy. Following pharmacological and mechanical thrombectomy as detailed above, there is restored flow as demonstrated on limited right central pelvic venogram with large amount of residual thrombus. Following infusion catheter placement, both side ports traverse both occluded segments of the IVC and pelvic venous systems. IMPRESSION: 1. 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 2. Successful initiation of bilateral mechanical and pharmacologic thrombectomy with bilateral infusion catheters appropriately positioned. PLAN: The patient will be transferred to ICU for overnight lytic infusion and will return tomorrow for repeat venogram and potential intervention.  Electronically Signed   By: Sandi Mariscal M.D.   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 Patient has experienced minimal improvement since initiation of IV heparin, and as such request made for initiation of catheter directed thrombolysis for symptomatic purposes. EXAM: 1. ULTRASOUND GUIDANCE FOR VASCULAR ACCESS X2 2. FLUOROSCOPIC GUIDED PHARMACOLOGICAL AND MECHANICAL THROMBECTOMY WITH ANGIOJET DEVICE 3. BILATERAL LOWER EXTREMITY VENOGRAM AND PLACEMENT OF INFUSION CATHETERS COMPARISON:  CT chest, abdomen pelvis-04/11/2019; abdominal MRV - 04/13/2019 MEDICATIONS: Hydralazine 10 mg IV, administered near the completion of the procedure secondary to persistently elevated blood pressure. CONTRAST:  60 cc Omnipaque 300 ANESTHESIA/SEDATION: Moderate (conscious) sedation was employed during this procedure. A total of Versed 3 mg and Fentanyl 125 mcg was administered intravenously. Moderate Sedation Time: 67 minutes. The patient's level of consciousness and vital signs were monitored continuously by radiology nursing throughout the procedure under my direct supervision. FLUOROSCOPY TIME:  18 minutes, 18 seconds (557 mGy) COMPLICATIONS: None immediate. TECHNIQUE: Informed written consent was obtained from the patient after a discussion of the risks (including but not limited to bleeding, including nontarget bleeding given history of malignancy, infection, pulmonary embolism and necessity for venous angioplasty and/or stent placement), benefits and alternatives to treatment. Questions regarding the procedure were encouraged and answered. A timeout was performed prior to the initiation of the procedure. The patient was placed prone on the fluoroscopy table and the skin posterior to the  bilateral knees was prepped and draped in the usual sterile fashion, and a sterile drape was applied covering the operative field. Maximum barrier sterile technique with sterile gowns and gloves were used for the procedure. Under direct ultrasound guidance, the left popliteal vein was access with a micropuncture kit after the overlying soft tissues were anesthetized with 1% lidocaine. An ultrasound image was saved for documentation purposed. This allowed for placement of a 8-French vascular sheath. A limited venogram was performed through the side arm of the vascular sheath. With the use of a regular glidewire, a Kumpe catheter was advanced through the left femoral, external iliac vein and through the left common iliac vein to the level of the IVC. Limited vena cavogram demonstrates complete occlusion of the distal aspect of the IVC extending to involve both left common iliac veins. Ultimately, a moderate to long  segment apparently extrinsic malignant narrowing involving the mid/distal aspect of the IVC was successfully traversed with inferior venogram demonstrating patency of the more cranial infrarenal IVC. Next, with the use of a Angiojet device, approximately 250 cc of a solution of 10 mg of tPA mixed in 500 mg of saline was infused throughout the occluded segment of the IVC, left common and external iliac veins to the level of the left common femoral vein. Attention was now paid towards access of the right lower extremity popliteal vein. After the overlying soft tissues were anesthetized with 1% lidocaine, the right popliteal vein was accessed micropuncture kit. Limited venogram was performed through the outer micro puncture sheath. This allowed for placement of a 6 French, 35 cm vascular sheath. Again, with the use of a regular glidewire, a Kumpe catheter was advanced beyond the obstructing caval lesion. Contrast injection confirmed appropriate positioning. Next, after approximately 20 minute tPA dwell,  mechanical thrombectomy was performed with the Angiojet device both from the left lower extremity access as well as the right lower extremity access. Limited vena cavagram was performed via the right lower extremity access. Kumpe catheter was utilized for measurement purposes and ultimately a 90 cm/20-cm infusion catheter was placed from the right lower extremity access and a 90 cm/30 cm infusion catheter was placed from the left lower extremity access adequately covering the occluded segment of the bilateral venous systems as well as the caudal aspect of the IVC. Postprocedural spot fluoroscopic images were obtained. Both popliteal vascular sheath were secured in place. Dressings were applied. Patient's was subjectively cold by the end of the procedure with associated elevation in his blood pressure and as such he was administered 10 mg of hydralazine and bear hugger warming device was applied. The patient otherwise tolerated the procedure well without immediate postprocedural complication. FINDINGS: Sonographic evaluation demonstrates patency of the bilateral popliteal veins. Contrast injection demonstrates patency of the bilateral popliteal and superficial femoral veins. There is occlusive thrombus extending from the left common femoral vein and the right common iliac vein through the mid/caudal aspect of the IVC secondary to a moderate to long segment length presumably extrinsic malignant occlusion secondary to known adjacent bulky retroperitoneal lymphadenopathy. Following pharmacological and mechanical thrombectomy as detailed above, there is restored flow as demonstrated on limited right central pelvic venogram with large amount of residual thrombus. Following infusion catheter placement, both side ports traverse both occluded segments of the IVC and pelvic venous systems. IMPRESSION: 1. 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 2. Successful initiation of bilateral mechanical and pharmacologic thrombectomy with bilateral infusion catheters appropriately positioned. PLAN: The patient will be transferred to ICU for overnight lytic infusion and will return tomorrow for repeat venogram and potential intervention. Electronically Signed   By: Sandi Mariscal M.D.   On: 04/15/2019 17:04   US RENAL  Result Date: 04/17/2019 CLINICAL DATA:  Urinary retention.  History of testicular carcinoma. EXAM: RENAL / URINARY TRACT ULTRASOUND COMPLETE COMPARISON:  CT scan 04/11/2019 FINDINGS: Right Kidney: Renal measurements: 13.5 x 5.5 x 6.6 cm = volume: 260.0 mL . Normal renal cortical thickness and echogenicity without focal lesions or hydronephrosis. Left Kidney: Renal measurements: 12.0 x 6.8 x 5.4 cm = volume: 229.4 mL. Normal renal cortical thickness and echogenicity without focal lesions or hydronephrosis. Bladder: Decompressed by Foley catheter. Other: None. IMPRESSION: Normal sonographic appearance of both kidneys.  No hydronephrosis. Bladder decompressed  by a Foley catheter. Electronically Signed   By: Marijo Sanes M.D.   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  Patient has experienced minimal improvement since initiation of IV heparin, and as such request made for initiation of catheter directed thrombolysis for symptomatic purposes.  EXAM: 1. ULTRASOUND GUIDANCE FOR VASCULAR ACCESS X2 2. FLUOROSCOPIC GUIDED PHARMACOLOGICAL AND MECHANICAL THROMBECTOMY WITH ANGIOJET DEVICE 3. BILATERAL LOWER EXTREMITY VENOGRAM AND PLACEMENT OF INFUSION CATHETERS  COMPARISON:  CT chest, abdomen pelvis-04/11/2019; abdominal MRV - 04/13/2019  MEDICATIONS: Hydralazine 10 mg IV,  administered near the completion of the procedure secondary to persistently elevated blood pressure.  CONTRAST:  60 cc Omnipaque 300  ANESTHESIA/SEDATION: Moderate (conscious) sedation was employed during this procedure. A total of Versed 3 mg and Fentanyl 125 mcg was administered intravenously.  Moderate Sedation Time: 67 minutes. The patient's level of consciousness and vital signs were monitored continuously by radiology nursing throughout the procedure under my direct supervision.  FLUOROSCOPY TIME:  18 minutes, 18 seconds (093 mGy)  COMPLICATIONS: None immediate.  TECHNIQUE: Informed written consent was obtained from the patient after a discussion of the risks (including but not limited to bleeding, including nontarget bleeding given history of malignancy, infection, pulmonary embolism and necessity for venous angioplasty and/or stent placement), benefits and alternatives to treatment. Questions regarding the procedure were encouraged and answered. A timeout was performed prior to the initiation of the procedure.  The patient was placed prone on the fluoroscopy table and the skin posterior to the bilateral knees was prepped and draped in the usual sterile fashion, and a sterile drape was applied covering the operative field. Maximum barrier sterile technique with sterile gowns and gloves were used for the procedure.  Under direct ultrasound guidance, the left popliteal vein was access with a micropuncture kit after the overlying soft tissues were anesthetized with 1% lidocaine. An ultrasound image was saved for documentation purposed. This allowed for placement of a 8-French vascular sheath. A limited venogram was performed through the side arm of the vascular sheath.  With the use of a regular glidewire, a Kumpe catheter was advanced through the left femoral, external iliac vein and through the left common iliac vein to the level of the IVC.  Limited vena cavogram demonstrates complete occlusion of  the distal aspect of the IVC extending to involve both left common iliac veins.  Ultimately, a moderate to long segment apparently extrinsic malignant narrowing involving the mid/distal aspect of the IVC was successfully traversed with inferior venogram demonstrating patency of the more cranial infrarenal IVC.  Next, with the use of a Angiojet device, approximately 250 cc of a solution of 10 mg of tPA mixed in 500 mg of saline was infused throughout the occluded segment of the IVC, left common and external iliac veins to the level of the left common femoral vein.  Attention was now paid towards access of the right lower extremity popliteal vein.  After the overlying soft tissues were anesthetized with 1% lidocaine, the right popliteal vein was accessed micropuncture kit. Limited venogram was performed through the outer micro puncture sheath. This allowed for placement of a 6 French, 35 cm vascular sheath.  Again, with the use of a regular glidewire, a Kumpe catheter was advanced beyond the obstructing caval lesion. Contrast injection confirmed appropriate positioning.  Next, after approximately 20 minute tPA dwell, mechanical thrombectomy was performed with the Angiojet device both from the left lower extremity access as well as the right lower extremity access.  Limited vena cavagram was performed via the right lower extremity access.  Kumpe catheter was utilized for measurement purposes and ultimately a 90 cm/20-cm infusion catheter was placed from the right lower extremity access and a 90 cm/30 cm infusion catheter was placed from the left lower extremity access adequately covering the occluded segment of the bilateral venous systems as well as the caudal aspect of the IVC.  Postprocedural spot fluoroscopic images were obtained.  Both popliteal vascular sheath were secured in place. Dressings were applied.  Patient's was subjectively cold by the end of the procedure with associated elevation in his  blood pressure and as such he was administered 10 mg of hydralazine and bear hugger warming device was applied.  The patient otherwise tolerated the procedure well without immediate postprocedural complication.  FINDINGS: Sonographic evaluation demonstrates patency of the bilateral popliteal veins.  Contrast injection demonstrates patency of the bilateral popliteal and superficial femoral veins.  There is occlusive thrombus extending from the left common femoral vein and the right common iliac vein through the mid/caudal aspect of the IVC secondary to a moderate to long segment length presumably extrinsic malignant occlusion secondary to known adjacent bulky retroperitoneal lymphadenopathy.  Following pharmacological and mechanical thrombectomy as detailed above, there is restored flow as demonstrated on limited right central pelvic venogram with large amount of residual thrombus.  Following infusion catheter placement, both side ports traverse both occluded segments of the IVC and pelvic venous systems.  IMPRESSION: 1. 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 2. Successful initiation of bilateral mechanical and pharmacologic thrombectomy with bilateral infusion catheters appropriately positioned.  PLAN: The patient will be transferred to ICU for overnight lytic infusion and will return tomorrow for repeat venogram and potential intervention.   Electronically Signed   By: Sandi Mariscal M.D.   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  Patient was initiated bilateral pelvic/IVC catheter directed thrombolysis day prior (04/15/2019), and returns  today following 24 hours of tPA infusion.  EXAM: 1. IR THROMB F/U EVAL ART/VEN FINAL DAY 2. FLUOROSCOPIC GUIDED ANGIOPLASTY AND STENT PLACEMENT OF THE IVC  COMPARISON:  Initiation of bilateral catheter directed pelvic venous and IVC thrombolysis - 04/15/2019; CT abdomen pelvis - 04/11/2019  MEDICATIONS: Heparin 7000 units  CONTRAST:  85 cc Omnipaque 300  ANESTHESIA/SEDATION: Moderate (conscious) sedation was employed during this procedure. A total of Versed 2 mg and Dilaudid 3 mg was administered intravenously.  Moderate Sedation Time: 128 minutes. The patient's level of consciousness and vital signs were monitored continuously by radiology nursing throughout the procedure under my direct supervision.  FLUOROSCOPY TIME:  14 minutes, 12 seconds (956.3 mGy)  COMPLICATIONS: None immediate.  TECHNIQUE: Informed written consent was obtained from the patient after a discussion of the risks, benefits and alternatives to treatment. Questions regarding the procedure were encouraged and answered. A timeout was performed prior to the initiation of the procedure.  The patient was placed prone on the fluoroscopy table and the skin posterior to the bilateral knees as well as  the external portion the existing bilateral vascular sheaths and infusion catheters was prepped and draped in usual sterile fashion. The surrounding subcutaneous tissues about both popliteal approach vascular sheath was anesthetized with 1% lidocaine with epinephrine.  Next, post lysis venograms were performed from the bilateral infusion catheters following removal of the bilateral infusion wires.  Images were reviewed and the decision was made to proceed with additional mechanical thrombectomy.  With the use of a vertebral catheter exchange length stiff Glidewires were advanced from both existing popliteal vascular sheaths superior to the nearly obstructing caval lesion. Contrast injection confirmed appropriate positioning. Stiff glide wires  were advanced into the left subclavian vein.  4000 units of heparin was administered intravenously.  Next, beginning with the left lower extremity, the existing 8 French vascular sheath was exchanged over the glidewire for a 13 [place] vascular sheath.  The 16 mm diameter Clottreiever Inari mechanical thrombectomy device was then utilized to perform 3 passes from the left popliteal approach from at and just below level of the obstructing caval lesion. Post thrombectomy images were obtained from the left lower extremity.  Next, the existing right lower extremity 6 French popliteal vein vascular sheath was exchanged over a guidewire for a 13 French vascular sheath. Again, from the right lower extremity approach, the 16 mm diameter Clottreiever Inari mechanical thrombectomy device was then utilized to perform 3 passes from the level at and just below the obstructing caval lesion. Post thrombectomy images were obtained from the right lower extremity.  An ACT level was obtained and an additional 3000 units of heparin was administered intravenously.  The long segment eccentric irregular narrowing/subtotal occlusion of the IVC was subsequently balloon angioplastied at multiple stations with a 14 mm x 4 cm Atlas balloon. Despite adequate balloon apposition, post angioplasty venogram images demonstrate a recurrent hemodynamically significant narrowing.  As such, a 20 mm x 60 mm Venovo venous stent was deployed across the superior and mid aspect of the caval lesion and was subsequently balloon angioplastied at multiple stations initially with a 16 mm x 4 cm atlas balloon and ultimately with an 18 mm x 4 cm balloon.  Post stent deployment venogram images were obtained however there is felt to be incomplete coverage of the distal aspect of the malignant narrowing. As such, an additional 20 mm x 40 mm Venovo venous stent was deployed overlapping the mid/caudal aspect of venous stent and adequately covering the caudal  aspect caval lesion.  Both overlapping venous stents were subsequently balloon angioplastied at multiple stations with the 18 mm atlas balloon and completion venogram images were obtained from both lower extremities. Images were reviewed and the procedure was terminated.  All wires, catheters and sheaths were removed from the bilateral popliteal vein accesses and hemostasis was achieved with manual compression. Dressings applied. The patient tolerated the procedure well without immediate postprocedural complication.  FINDINGS: Initial post lysis venogram performed from the left lower extremity infusion catheter demonstrates restoration of flow through the left pelvis with moderate amount residual thrombus primarily within the peripheral aspect of the left external iliac vein as well as the left common and central aspect of the left superficial femoral veins. There is passage of contrast beyond the nearly obstructing lesion involving mid/caudal aspect of the IVC.  Initial post lysis venogram performed from the right lower extremity demonstrates persistent occlusion at the level of the right common iliac vein.  Following 3 passes from each lower extremity with the 16 mm diameter Clottreiever Inari mechanical thrombectomy device, nearly  all residual clot burden within the pelvis was removed.  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 This lesion was again noted to be significantly caudal to the confluence of the bilateral renal veins.  As such, nearly obstructing caval lesion was treated with overlapping stent deployment of 20 mm diameter Venovo venous stents which were subsequently balloon angioplastied to 18 mm diameter.  Completion images demonstrate a technically  excellent result with restored brisk flow through the bilateral pelvic systems and the IVC.  There is a mild residual narrowing at the level of the mid/caudal aspect of the IVC, though not felt to result in a residual hemodynamically significant stenosis.  No evidence of complication. Specifically, no evidence of contrast extravasation or vessel dissection.  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  PLAN: - Maintain IV heparin drip until patient is successfully converted to long-term anticoagulant.  - Recommend maintaining SCD devices while in bed.   Electronically Signed   By: Sandi Mariscal M.D.   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 Patient has experienced minimal improvement since initiation of IV heparin, and as such request made for initiation of catheter directed thrombolysis for symptomatic purposes. EXAM: 1. ULTRASOUND GUIDANCE FOR VASCULAR ACCESS X2 2. FLUOROSCOPIC GUIDED PHARMACOLOGICAL AND MECHANICAL THROMBECTOMY WITH ANGIOJET DEVICE 3. BILATERAL LOWER EXTREMITY VENOGRAM AND PLACEMENT OF INFUSION CATHETERS COMPARISON:  CT chest, abdomen pelvis-04/11/2019; abdominal MRV - 04/13/2019 MEDICATIONS: Hydralazine 10 mg IV, administered near the completion of the procedure secondary to persistently elevated blood pressure. CONTRAST:  60 cc Omnipaque 300 ANESTHESIA/SEDATION: Moderate (conscious) sedation was employed during this procedure. A total of Versed 3 mg and Fentanyl 125 mcg was administered intravenously. Moderate Sedation Time: 67 minutes. The patient's level of consciousness and  vital signs were monitored continuously by radiology nursing throughout the procedure under my direct supervision. FLUOROSCOPY TIME:  18 minutes, 18 seconds (176 mGy) COMPLICATIONS: None immediate. TECHNIQUE: Informed written consent was obtained from the patient after a discussion of the risks (including but not limited to bleeding, including nontarget bleeding given history of malignancy, infection, pulmonary embolism and necessity for venous angioplasty and/or stent placement), benefits and alternatives to treatment. Questions regarding the procedure were encouraged and answered. A timeout was performed prior to the initiation of the procedure. The patient was placed prone on the fluoroscopy table and the skin posterior to the bilateral knees was prepped and draped in the usual sterile fashion, and a sterile drape was applied covering the operative field. Maximum barrier sterile technique with sterile gowns and gloves were used for the procedure. Under direct ultrasound guidance, the left popliteal vein was access with a micropuncture kit after the overlying soft tissues were anesthetized with 1% lidocaine. An ultrasound image was saved for documentation purposed. This allowed for placement of a 8-French vascular sheath. A limited venogram was performed through the side arm of the vascular sheath. With the use of a regular glidewire, a Kumpe catheter was advanced through the left femoral, external iliac vein and through the left common iliac vein to the level  of the IVC. Limited vena cavogram demonstrates complete occlusion of the distal aspect of the IVC extending to involve both left common iliac veins. Ultimately, a moderate to long segment apparently extrinsic malignant narrowing involving the mid/distal aspect of the IVC was successfully traversed with inferior venogram demonstrating patency of the more cranial infrarenal IVC. Next, with the use of a Angiojet device, approximately 250 cc of a solution of 10  mg of tPA mixed in 500 mg of saline was infused throughout the occluded segment of the IVC, left common and external iliac veins to the level of the left common femoral vein. Attention was now paid towards access of the right lower extremity popliteal vein. After the overlying soft tissues were anesthetized with 1% lidocaine, the right popliteal vein was accessed micropuncture kit. Limited venogram was performed through the outer micro puncture sheath. This allowed for placement of a 6 French, 35 cm vascular sheath. Again, with the use of a regular glidewire, a Kumpe catheter was advanced beyond the obstructing caval lesion. Contrast injection confirmed appropriate positioning. Next, after approximately 20 minute tPA dwell, mechanical thrombectomy was performed with the Angiojet device both from the left lower extremity access as well as the right lower extremity access. Limited vena cavagram was performed via the right lower extremity access. Kumpe catheter was utilized for measurement purposes and ultimately a 90 cm/20-cm infusion catheter was placed from the right lower extremity access and a 90 cm/30 cm infusion catheter was placed from the left lower extremity access adequately covering the occluded segment of the bilateral venous systems as well as the caudal aspect of the IVC. Postprocedural spot fluoroscopic images were obtained. Both popliteal vascular sheath were secured in place. Dressings were applied. Patient's was subjectively cold by the end of the procedure with associated elevation in his blood pressure and as such he was administered 10 mg of hydralazine and bear hugger warming device was applied. The patient otherwise tolerated the procedure well without immediate postprocedural complication. FINDINGS: Sonographic evaluation demonstrates patency of the bilateral popliteal veins. Contrast injection demonstrates patency of the bilateral popliteal and superficial femoral veins. There is occlusive  thrombus extending from the left common femoral vein and the right common iliac vein through the mid/caudal aspect of the IVC secondary to a moderate to long segment length presumably extrinsic malignant occlusion secondary to known adjacent bulky retroperitoneal lymphadenopathy. Following pharmacological and mechanical thrombectomy as detailed above, there is restored flow as demonstrated on limited right central pelvic venogram with large amount of residual thrombus. Following infusion catheter placement, both side ports traverse both occluded segments of the IVC and pelvic venous systems. IMPRESSION: 1. 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 2. Successful initiation of bilateral mechanical and pharmacologic thrombectomy with bilateral infusion catheters appropriately positioned. PLAN: The patient will be transferred to ICU for overnight lytic infusion and will return tomorrow for repeat venogram and potential intervention. Electronically Signed   By: Sandi Mariscal M.D.   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 Patient has experienced minimal improvement since initiation of IV heparin, and as such request made for initiation of catheter directed thrombolysis for symptomatic purposes. EXAM: 1. ULTRASOUND GUIDANCE FOR VASCULAR ACCESS X2 2.  FLUOROSCOPIC GUIDED PHARMACOLOGICAL AND MECHANICAL THROMBECTOMY WITH ANGIOJET DEVICE 3. BILATERAL LOWER EXTREMITY VENOGRAM AND PLACEMENT OF INFUSION CATHETERS COMPARISON:  CT chest, abdomen pelvis-04/11/2019; abdominal MRV - 04/13/2019 MEDICATIONS: Hydralazine 10 mg IV, administered near the completion of the procedure  secondary to persistently elevated blood pressure. CONTRAST:  60 cc Omnipaque 300 ANESTHESIA/SEDATION: Moderate (conscious) sedation was employed during this procedure. A total of Versed 3 mg and Fentanyl 125 mcg was administered intravenously. Moderate Sedation Time: 67 minutes. The patient's level of consciousness and vital signs were monitored continuously by radiology nursing throughout the procedure under my direct supervision. FLUOROSCOPY TIME:  18 minutes, 18 seconds (465 mGy) COMPLICATIONS: None immediate. TECHNIQUE: Informed written consent was obtained from the patient after a discussion of the risks (including but not limited to bleeding, including nontarget bleeding given history of malignancy, infection, pulmonary embolism and necessity for venous angioplasty and/or stent placement), benefits and alternatives to treatment. Questions regarding the procedure were encouraged and answered. A timeout was performed prior to the initiation of the procedure. The patient was placed prone on the fluoroscopy table and the skin posterior to the bilateral knees was prepped and draped in the usual sterile fashion, and a sterile drape was applied covering the operative field. Maximum barrier sterile technique with sterile gowns and gloves were used for the procedure. Under direct ultrasound guidance, the left popliteal vein was access with a micropuncture kit after the overlying soft tissues were anesthetized with 1% lidocaine. An ultrasound image was saved for documentation purposed. This allowed for placement of a 8-French vascular sheath. A limited venogram was performed through the side arm of the vascular sheath. With the use of a regular glidewire, a Kumpe catheter was advanced through the left femoral, external iliac vein and through the left common iliac vein to the level of the IVC. Limited vena cavogram demonstrates complete occlusion of the distal aspect of the IVC extending to involve both left common  iliac veins. Ultimately, a moderate to long segment apparently extrinsic malignant narrowing involving the mid/distal aspect of the IVC was successfully traversed with inferior venogram demonstrating patency of the more cranial infrarenal IVC. Next, with the use of a Angiojet device, approximately 250 cc of a solution of 10 mg of tPA mixed in 500 mg of saline was infused throughout the occluded segment of the IVC, left common and external iliac veins to the level of the left common femoral vein. Attention was now paid towards access of the right lower extremity popliteal vein. After the overlying soft tissues were anesthetized with 1% lidocaine, the right popliteal vein was accessed micropuncture kit. Limited venogram was performed through the outer micro puncture sheath. This allowed for placement of a 6 French, 35 cm vascular sheath. Again, with the use of a regular glidewire, a Kumpe catheter was advanced beyond the obstructing caval lesion. Contrast injection confirmed appropriate positioning. Next, after approximately 20 minute tPA dwell, mechanical thrombectomy was performed with the Angiojet device both from the left lower extremity access as well as the right lower extremity access. Limited vena cavagram was performed via the right lower extremity access. Kumpe catheter was utilized for measurement purposes and ultimately a 90 cm/20-cm infusion catheter was placed from the right lower extremity access and a 90 cm/30 cm infusion catheter was placed from the left lower extremity access adequately covering the occluded segment of the bilateral venous systems as well as the caudal aspect of the IVC. Postprocedural spot fluoroscopic images were obtained. Both popliteal vascular sheath were secured  in place. Dressings were applied. Patient's was subjectively cold by the end of the procedure with associated elevation in his blood pressure and as such he was administered 10 mg of hydralazine and bear hugger warming  device was applied. The patient otherwise tolerated the procedure well without immediate postprocedural complication. FINDINGS: Sonographic evaluation demonstrates patency of the bilateral popliteal veins. Contrast injection demonstrates patency of the bilateral popliteal and superficial femoral veins. There is occlusive thrombus extending from the left common femoral vein and the right common iliac vein through the mid/caudal aspect of the IVC secondary to a moderate to long segment length presumably extrinsic malignant occlusion secondary to known adjacent bulky retroperitoneal lymphadenopathy. Following pharmacological and mechanical thrombectomy as detailed above, there is restored flow as demonstrated on limited right central pelvic venogram with large amount of residual thrombus. Following infusion catheter placement, both side ports traverse both occluded segments of the IVC and pelvic venous systems. IMPRESSION: 1. 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 2. Successful initiation of bilateral mechanical and pharmacologic thrombectomy with bilateral infusion catheters appropriately positioned. PLAN: The patient will be transferred to ICU for overnight lytic infusion and will return tomorrow for repeat venogram and potential intervention. Electronically Signed   By: Sandi Mariscal M.D.   On: 04/15/2019 17:04   CT BIOPSY  Result Date: 04/12/2019 INDICATION: 39 year old male with a history of testicular cancer previously treated. He presents with new retroperitoneal adenopathy EXAM: CT BIOPSY MEDICATIONS: None. ANESTHESIA/SEDATION: Moderate (conscious) sedation was employed during this procedure. A total of Versed 2.0 mg and Fentanyl 100 mcg was administered intravenously. Moderate Sedation Time: 14 minutes. The patient's level of consciousness and vital signs were monitored continuously by  radiology nursing throughout the procedure under my direct supervision. FLUOROSCOPY TIME:  CT COMPLICATIONS: None PROCEDURE: Informed written consent was obtained from the patient after a thorough discussion of the procedural risks, benefits and alternatives. All questions were addressed. Maximal Sterile Barrier Technique was utilized including caps, mask, sterile gowns, sterile gloves, sterile drape, hand hygiene and skin antiseptic. A timeout was performed prior to the initiation of the procedure. Patient positioned prone position on CT gantry table. Scout CT acquired for planning purposes. The patient was then prepped and draped in the usual sterile fashion. 1% lidocaine was used for local anesthesia. Introducer needle was then placed targeting the left-sided retroperitoneal lymphadenopathy. Once we confirmed needle tip position, multiple 18 gauge core biopsy were acquired. Specimen placed in the saline as S fresh specimen. Needle was removed and a final CT was acquired. Patient tolerated the procedure well and remained hemodynamically stable throughout. No complications were encountered and no significant blood loss. IMPRESSION: Status post CT-guided biopsy of left retroperitoneal lymphadenopathy. Signed, Dulcy Fanny. Dellia Nims, RPVI [facility] Radiology Electronically Signed   By: [name] D.O.   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 Patient was initiated bilateral pelvic/IVC catheter directed thrombolysis day prior (04/15/2019), and returns today following 24 hours of tPA infusion. EXAM: 1. IR THROMB F/U EVAL ART/VEN FINAL DAY 2. FLUOROSCOPIC GUIDED ANGIOPLASTY AND STENT PLACEMENT OF THE  IVC COMPARISON:  Initiation of bilateral catheter directed pelvic  venous and IVC thrombolysis - 04/15/2019; CT abdomen pelvis - 04/11/2019 MEDICATIONS: Heparin 7000 units CONTRAST:  85 cc Omnipaque 300 ANESTHESIA/SEDATION: Moderate (conscious) sedation was employed during this procedure. A total of Versed 2 mg and Dilaudid 3 mg was administered intravenously. Moderate Sedation Time: 128 minutes. The patient's level of consciousness and vital signs were monitored continuously by radiology nursing throughout the procedure under my direct supervision. FLUOROSCOPY TIME:  14 minutes, 12 seconds (782.9 mGy) COMPLICATIONS: None immediate. TECHNIQUE: Informed written consent was obtained from the patient after a discussion of the risks, benefits and alternatives to treatment. Questions regarding the procedure were encouraged and answered. A timeout was performed prior to the initiation of the procedure. The patient was placed prone on the fluoroscopy table and the skin posterior to the bilateral knees as well as the external portion the existing bilateral vascular sheaths and infusion catheters was prepped and draped in usual sterile fashion. The surrounding subcutaneous tissues about both popliteal approach vascular sheath was anesthetized with 1% lidocaine with epinephrine. Next, post lysis venograms were performed from the bilateral infusion catheters following removal of the bilateral infusion wires. Images were reviewed and the decision was made to proceed with additional mechanical thrombectomy. With the use of a vertebral catheter exchange length stiff Glidewires were advanced from both existing popliteal vascular sheaths superior to the nearly obstructing caval lesion. Contrast injection confirmed appropriate positioning. Stiff glide wires were advanced into the left subclavian vein. 4000 units of heparin was administered intravenously. Next, beginning with the left lower extremity, the existing 8 French vascular  sheath was exchanged over the glidewire for a 13 [place] vascular sheath. The 16 mm diameter Clottreiever Inari mechanical thrombectomy device was then utilized to perform 3 passes from the left popliteal approach from at and just below level of the obstructing caval lesion. Post thrombectomy images were obtained from the left lower extremity. Next, the existing right lower extremity 6 French popliteal vein vascular sheath was exchanged over a guidewire for a 13 French vascular sheath. Again, from the right lower extremity approach, the 16 mm diameter Clottreiever Inari mechanical thrombectomy device was then utilized to perform 3 passes from the level at and just below the obstructing caval lesion. Post thrombectomy images were obtained from the right lower extremity. An ACT level was obtained and an additional 3000 units of heparin was administered intravenously. The long segment eccentric irregular narrowing/subtotal occlusion of the IVC was subsequently balloon angioplastied at multiple stations with a 14 mm x 4 cm Atlas balloon. Despite adequate balloon apposition, post angioplasty venogram images demonstrate a recurrent hemodynamically significant narrowing. As such, a 20 mm x 60 mm Venovo venous stent was deployed across the superior and mid aspect of the caval lesion and was subsequently balloon angioplastied at multiple stations initially with a 16 mm x 4 cm atlas balloon and ultimately with an 18 mm x 4 cm balloon. Post stent deployment venogram images were obtained however there is felt to be incomplete coverage of the distal aspect of the malignant narrowing. As such, an additional 20 mm x 40 mm Venovo venous stent was deployed overlapping the mid/caudal aspect of venous stent and adequately covering the caudal aspect caval lesion. Both overlapping venous stents were subsequently balloon angioplastied at multiple stations with the 18 mm atlas balloon and completion venogram images were obtained from both  lower extremities. Images were reviewed and the procedure was terminated. All wires, catheters and sheaths were removed from the bilateral popliteal vein accesses and hemostasis was  achieved with manual compression. Dressings applied. The patient tolerated the procedure well without immediate postprocedural complication. FINDINGS: Initial post lysis venogram performed from the left lower extremity infusion catheter demonstrates restoration of flow through the left pelvis with moderate amount residual thrombus primarily within the peripheral aspect of the left external iliac vein as well as the left common and central aspect of the left superficial femoral veins. There is passage of contrast beyond the nearly obstructing lesion involving mid/caudal aspect of the IVC. Initial post lysis venogram performed from the right lower extremity demonstrates persistent occlusion at the level of the right common iliac vein. Following 3 passes from each lower extremity with the 16 mm diameter Clottreiever Inari mechanical thrombectomy device, nearly all residual clot burden within the pelvis was removed. 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 This lesion was again noted to be significantly caudal to the confluence of the bilateral renal veins. As such, nearly obstructing caval lesion was treated with overlapping stent deployment of 20 mm diameter Venovo venous stents which were subsequently balloon angioplastied to 18 mm diameter. Completion images demonstrate a technically excellent result with restored brisk flow through the bilateral pelvic systems and the IVC. There is a mild residual narrowing at the level of the mid/caudal aspect of the IVC, though not felt to result in a residual  hemodynamically significant stenosis. No evidence of complication. Specifically, no evidence of contrast extravasation or vessel dissection. 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 PLAN: - Maintain IV heparin drip until patient is successfully converted to long-term anticoagulant. - Recommend maintaining SCD devices while in bed. Electronically Signed   By: Sandi Mariscal M.D.   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 EXAM: MRI ABDOMEN WITHOUT AND WITH CONTRAST TECHNIQUE: Multiplanar multisequence MR imaging of the abdomen was performed both before and after the administration of intravenous contrast. CONTRAST:  5m GADAVIST GADOBUTROL 1 MMOL/ML IV SOLN COMPARISON:  CT 04/11/2019 FINDINGS: Vascular: Arterial: Unremarkable course caliber and contour of the visualized abdominal aorta. Unremarkable course caliber and contour of the bilateral iliac arteries and proximal femoral arteries. Bilateral hypogastric arteries are patent. Venous: Right iliac/femoral system: Flow signal is maintained within the visualized proximal femoral veins. Flow signal maintained into the external iliac vein. Absent flow signal with evidence of thrombus involving the proximal right common iliac vein to the confluence and into the IVC. Expansion of the right common iliac vein. Multiple pelvic collateral vessels. Left iliac/femoral system: Absent flow signal through the length of the left iliac venous system extending through the femoral vein. Expansion of the left iliac and femoral venous system. Multiple pelvic collateral veins. IVC: The IVC  is not well evaluated at the level of the adenopathy. Absent flow signal above the confluence of the iliac veins compatible with ilio caval thrombus. The juxtarenal IVC is patent with maintained flow signal and maintain flow signal of the bilateral renal veins. Nonvascular: Unremarkable musculoskeletal structures. Partially distended urinary bladder. Unremarkable appearance of the visualized kidneys and GI system. Edema of the lower abdominal  wall/pelvic body wall, as well as edema of the left proximal lower extremity. 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 Pathologic lymphadenopathy of the retroperitoneum, similar to comparison CT, contributing to external compression of the infrarenal IVC. The current MR is nondiagnostic to confirm or rule out tumor thrombus. Lower abdominal/pelvic wall edema including edema of the proximal left lower extremity. Signed, Dulcy Fanny. Dellia Nims, RPVI [facility] Radiology Electronically Signed   By: [name] D.O.   On: 04/13/2019 14:14   VAS [place] LOWER EXTREMITY VENOUS (DVT)  Result Date: 04/11/2019  Lower Venous Study Indications: Swelling.  Risk Factors: None identified. Anticoagulation: Heparin. Limitations: Poor ultrasound/tissue interface and bowel gas. Comparison Study: No prior studies. Performing Technologist: Oliver Hum RVT  Examination Guidelines: A complete evaluation includes B-mode imaging, spectral Doppler, color Doppler, and power Doppler as needed of all accessible portions of each vessel. Bilateral testing is considered an integral part of a complete examination. Limited examinations for reoccurring indications may be performed as noted.  +---------+---------------+---------+-----------+----------+--------------+ RIGHT    CompressibilityPhasicitySpontaneityPropertiesThrombus  Aging +---------+---------------+---------+-----------+----------+--------------+ CFV      Full           Yes      Yes                                 +---------+---------------+---------+-----------+----------+--------------+ SFJ      Full                                                        +---------+---------------+---------+-----------+----------+--------------+ FV Prox  Full                                                        +---------+---------------+---------+-----------+----------+--------------+ FV Mid   Full                                                        +---------+---------------+---------+-----------+----------+--------------+ FV DistalFull                                                        +---------+---------------+---------+-----------+----------+--------------+ PFV      Full                                                        +---------+---------------+---------+-----------+----------+--------------+ POP      Full           Yes      Yes                                 +---------+---------------+---------+-----------+----------+--------------+  PTV      Full                                                        +---------+---------------+---------+-----------+----------+--------------+ PERO     Full                                                        +---------+---------------+---------+-----------+----------+--------------+   +---------+---------------+---------+-----------+----------+--------------+ LEFT     CompressibilityPhasicitySpontaneityPropertiesThrombus Aging +---------+---------------+---------+-----------+----------+--------------+ CFV      None           No       No                   Acute          +---------+---------------+---------+-----------+----------+--------------+ SFJ      None                                         Acute           +---------+---------------+---------+-----------+----------+--------------+ FV Prox  None           No       No                   Acute          +---------+---------------+---------+-----------+----------+--------------+ FV Mid   None           No       No                   Acute          +---------+---------------+---------+-----------+----------+--------------+ FV DistalNone           No       No                   Acute          +---------+---------------+---------+-----------+----------+--------------+ POP      Partial        No       No                   Acute          +---------+---------------+---------+-----------+----------+--------------+ PTV      Full                                                        +---------+---------------+---------+-----------+----------+--------------+ PERO     None                                         Acute          +---------+---------------+---------+-----------+----------+--------------+ EIV      None           No       No  Acute          +---------+---------------+---------+-----------+----------+--------------+ Unable to visualize CIV, or IVC due to bowel gas.    Summary: Right: There is no evidence of deep vein thrombosis in the lower extremity. No cystic structure found in the popliteal fossa. Left: Findings consistent with acute deep vein thrombosis involving the left external iliac vein, common femoral vein, left femoral vein, left popliteal vein, and left peroneal veins. No cystic structure found in the popliteal fossa. Unable to visualize CIV, or IVC due to bowel gas.  *See table(s) above for measurements and observations. Electronically signed by Deitra Mayo MD on 04/11/2019 at 4:09:39 PM.    Final    IR INFUSION THROMBOL VENOUS INITIAL (MS)  Result Date: 04/15/2019 INDICATION: History of newly diagnosed recurrent testicular cancer, now with bulky retroperitoneal lymphadenopathy  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 Patient has experienced minimal improvement since initiation of IV heparin, and as such request made for initiation of catheter directed thrombolysis for symptomatic purposes. EXAM: 1. ULTRASOUND GUIDANCE FOR VASCULAR ACCESS X2 2. FLUOROSCOPIC GUIDED PHARMACOLOGICAL AND MECHANICAL THROMBECTOMY WITH ANGIOJET DEVICE 3. BILATERAL LOWER EXTREMITY VENOGRAM AND PLACEMENT OF INFUSION CATHETERS COMPARISON:  CT chest, abdomen pelvis-04/11/2019; abdominal MRV - 04/13/2019 MEDICATIONS: Hydralazine 10 mg IV, administered near the completion of the procedure secondary to persistently elevated blood pressure. CONTRAST:  60 cc Omnipaque 300 ANESTHESIA/SEDATION: Moderate (conscious) sedation was employed during this procedure. A total of Versed 3 mg and Fentanyl 125 mcg was administered intravenously. Moderate Sedation Time: 67 minutes. The patient's level of consciousness and vital signs were monitored continuously by radiology nursing throughout the procedure under my direct supervision. FLUOROSCOPY TIME:  18 minutes, 18 seconds (295 mGy) COMPLICATIONS: None immediate. TECHNIQUE: Informed written consent was obtained from the patient after a discussion of the risks (including but not limited to bleeding, including nontarget bleeding given history of malignancy, infection, pulmonary embolism and necessity for venous angioplasty and/or stent placement), benefits and alternatives to treatment. Questions regarding the procedure were encouraged and answered. A timeout was performed prior to the initiation of the procedure. The patient was placed prone on the fluoroscopy table and the skin posterior to the bilateral knees was prepped and draped in the usual sterile fashion, and a sterile drape was applied covering the operative field. Maximum barrier sterile technique with sterile gowns and gloves were used for the procedure. Under direct  ultrasound guidance, the left popliteal vein was access with a micropuncture kit after the overlying soft tissues were anesthetized with 1% lidocaine. An ultrasound image was saved for documentation purposed. This allowed for placement of a 8-French vascular sheath. A limited venogram was performed through the side arm of the vascular sheath. With the use of a regular glidewire, a Kumpe catheter was advanced through the left femoral, external iliac vein and through the left common iliac vein to the level of the IVC. Limited vena cavogram demonstrates complete occlusion of the distal aspect of the IVC extending to involve both left common iliac veins. Ultimately, a moderate to long segment apparently extrinsic malignant narrowing involving the mid/distal aspect of the IVC was successfully traversed with inferior venogram demonstrating patency of the more cranial infrarenal IVC. Next, with the use of a Angiojet device, approximately 250 cc of a solution of 10 mg of tPA mixed in 500 mg of saline was infused throughout the occluded segment of the IVC, left common and external iliac veins to the level of the left common  femoral vein. Attention was now paid towards access of the right lower extremity popliteal vein. After the overlying soft tissues were anesthetized with 1% lidocaine, the right popliteal vein was accessed micropuncture kit. Limited venogram was performed through the outer micro puncture sheath. This allowed for placement of a 6 French, 35 cm vascular sheath. Again, with the use of a regular glidewire, a Kumpe catheter was advanced beyond the obstructing caval lesion. Contrast injection confirmed appropriate positioning. Next, after approximately 20 minute tPA dwell, mechanical thrombectomy was performed with the Angiojet device both from the left lower extremity access as well as the right lower extremity access. Limited vena cavagram was performed via the right lower extremity access. Kumpe catheter was  utilized for measurement purposes and ultimately a 90 cm/20-cm infusion catheter was placed from the right lower extremity access and a 90 cm/30 cm infusion catheter was placed from the left lower extremity access adequately covering the occluded segment of the bilateral venous systems as well as the caudal aspect of the IVC. Postprocedural spot fluoroscopic images were obtained. Both popliteal vascular sheath were secured in place. Dressings were applied. Patient's was subjectively cold by the end of the procedure with associated elevation in his blood pressure and as such he was administered 10 mg of hydralazine and bear hugger warming device was applied. The patient otherwise tolerated the procedure well without immediate postprocedural complication. FINDINGS: Sonographic evaluation demonstrates patency of the bilateral popliteal veins. Contrast injection demonstrates patency of the bilateral popliteal and superficial femoral veins. There is occlusive thrombus extending from the left common femoral vein and the right common iliac vein through the mid/caudal aspect of the IVC secondary to a moderate to long segment length presumably extrinsic malignant occlusion secondary to known adjacent bulky retroperitoneal lymphadenopathy. Following pharmacological and mechanical thrombectomy as detailed above, there is restored flow as demonstrated on limited right central pelvic venogram with large amount of residual thrombus. Following infusion catheter placement, both side ports traverse both occluded segments of the IVC and pelvic venous systems. IMPRESSION: 1. 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 2. Successful initiation of bilateral mechanical and pharmacologic thrombectomy with bilateral infusion catheters appropriately positioned. PLAN: The patient will be transferred to ICU for overnight lytic  infusion and will return tomorrow for repeat venogram and potential intervention. Electronically Signed   By: Sandi Mariscal M.D.   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 Patient was initiated bilateral pelvic/IVC catheter directed thrombolysis day prior (04/15/2019), and returns today following 24 hours of tPA infusion. EXAM: 1. IR THROMB F/U EVAL ART/VEN FINAL DAY 2. FLUOROSCOPIC GUIDED ANGIOPLASTY AND STENT PLACEMENT OF THE IVC COMPARISON:  Initiation of bilateral catheter directed pelvic venous and IVC thrombolysis - 04/15/2019; CT abdomen pelvis - 04/11/2019 MEDICATIONS: Heparin 7000 units CONTRAST:  85 cc Omnipaque 300 ANESTHESIA/SEDATION: Moderate (conscious) sedation was employed during this procedure. A total of Versed 2 mg and Dilaudid 3 mg was administered intravenously. Moderate Sedation Time: 128 minutes. The patient's level of consciousness and vital signs were monitored continuously by radiology nursing throughout the procedure under my direct supervision. FLUOROSCOPY TIME:  14 minutes, 12 seconds (536.1 mGy) COMPLICATIONS: None immediate. TECHNIQUE: Informed written consent was  obtained from the patient after a discussion of the risks, benefits and alternatives to treatment. Questions regarding the procedure were encouraged and answered. A timeout was performed prior to the initiation of the procedure. The patient was placed prone on the fluoroscopy table and the skin posterior to the bilateral knees as well as the external portion the existing bilateral vascular sheaths and infusion catheters was prepped and draped in usual sterile fashion. The surrounding subcutaneous tissues about both popliteal approach vascular sheath was anesthetized with  1% lidocaine with epinephrine. Next, post lysis venograms were performed from the bilateral infusion catheters following removal of the bilateral infusion wires. Images were reviewed and the decision was made to proceed with additional mechanical thrombectomy. With the use of a vertebral catheter exchange length stiff Glidewires were advanced from both existing popliteal vascular sheaths superior to the nearly obstructing caval lesion. Contrast injection confirmed appropriate positioning. Stiff glide wires were advanced into the left subclavian vein. 4000 units of heparin was administered intravenously. Next, beginning with the left lower extremity, the existing 8 French vascular sheath was exchanged over the glidewire for a 13 [place] vascular sheath. The 16 mm diameter Clottreiever Inari mechanical thrombectomy device was then utilized to perform 3 passes from the left popliteal approach from at and just below level of the obstructing caval lesion. Post thrombectomy images were obtained from the left lower extremity. Next, the existing right lower extremity 6 French popliteal vein vascular sheath was exchanged over a guidewire for a 13 French vascular sheath. Again, from the right lower extremity approach, the 16 mm diameter Clottreiever Inari mechanical thrombectomy device was then utilized to perform 3 passes from the level at and just below the obstructing caval lesion. Post thrombectomy images were obtained from the right lower extremity. An ACT level was obtained and an additional 3000 units of heparin was administered intravenously. The long segment eccentric irregular narrowing/subtotal occlusion of the IVC was subsequently balloon angioplastied at multiple stations with a 14 mm x 4 cm Atlas balloon. Despite adequate balloon apposition, post angioplasty venogram images demonstrate a recurrent hemodynamically significant narrowing. As such, a 20 mm x 60 mm Venovo venous stent was deployed across the superior  and mid aspect of the caval lesion and was subsequently balloon angioplastied at multiple stations initially with a 16 mm x 4 cm atlas balloon and ultimately with an 18 mm x 4 cm balloon. Post stent deployment venogram images were obtained however there is felt to be incomplete coverage of the distal aspect of the malignant narrowing. As such, an additional 20 mm x 40 mm Venovo venous stent was deployed overlapping the mid/caudal aspect of venous stent and adequately covering the caudal aspect caval lesion. Both overlapping venous stents were subsequently balloon angioplastied at multiple stations with the 18 mm atlas balloon and completion venogram images were obtained from both lower extremities. Images were reviewed and the procedure was terminated. All wires, catheters and sheaths were removed from the bilateral popliteal vein accesses and hemostasis was achieved with manual compression. Dressings applied. The patient tolerated the procedure well without immediate postprocedural complication. FINDINGS: Initial post lysis venogram performed from the left lower extremity infusion catheter demonstrates restoration of flow through the left pelvis with moderate amount residual thrombus primarily within the peripheral aspect of the left external iliac vein as well as the left common and central aspect of the left superficial femoral veins. There is passage of contrast beyond the nearly obstructing lesion involving mid/caudal aspect of the IVC. Initial post  lysis venogram performed from the right lower extremity demonstrates persistent occlusion at the level of the right common iliac vein. Following 3 passes from each lower extremity with the 16 mm diameter Clottreiever Inari mechanical thrombectomy device, nearly all residual clot burden within the pelvis was removed. Despite balloon angioplasty at multiple stations to 14 mm diameter with excellent balloon apposition, post angioplasty inferior vena cavogram  demonstrates a recurrent hemodynamically significant malignant narrowing/subtotal occlusion involving the mid/distal aspect of IVC (series 22). This lesion was again noted to be significantly caudal to the confluence of the bilateral renal veins. As such, nearly obstructing caval lesion was treated with overlapping stent deployment of 20 mm diameter Venovo venous stents which were subsequently balloon angioplastied to 18 mm diameter. Completion images demonstrate a technically excellent result with restored brisk flow through the bilateral pelvic systems and the IVC. There is a mild residual narrowing at the level of the mid/caudal aspect of the IVC, though not felt to result in a residual hemodynamically significant stenosis. No evidence of complication. Specifically, no evidence of contrast extravasation or vessel dissection. 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 PLAN: - Maintain IV heparin drip until patient is successfully converted to long-term anticoagulant. - Recommend maintaining SCD devices while in bed. Electronically Signed   By: Sandi Mariscal M.D.   On: 04/16/2019 16:26    ASSESSMENT AND PLAN: 1.  Retroperitoneal and pelvic lymphadenopathy 2.  Left lower extremity DVT 3.  History of stage Ib seminoma testicular cancer diagnosed in 2012, status post right orchiectomy-no adjuvant chemotherapy given  -Status post biopsy of a left retroperitoneal lymph node.  Results consistent with seminoma.  Records have been faxed to [facility] and we are awaiting an appointment for him.  We will notify him of the appointment date and time when we have this information. -Status post pharmacological and mechanical thrombectomy with significant improvement in his left lower extremity swelling and pain.  He remains on a heparin drip.  Discussed  transitioning to Lovenox 1 mg/kg twice daily prior to discharge. -The patient reports generalized weakness due to being in bed.  PT consult has been placed.  Encouraged to get out of bed and ambulate. -I have provided a copy of his pathology results to his fiance per his request.   LOS: 6 days   [name], DNP, AGPCNP-BC, AOCNP 04/17/19   Addendum I have seen the patient. I agree with the assessment and and plan and have edited the notes.   I encourage him to ambulate and participate PT.  I recommend him to take lovenox 103m/kg q12h on discharge.  We will arrange his med/onc appointment at [facility]  Will f/u as needed before discharge.   YTruitt Merle 04/17/2019 .

## 2019-04-17 NOTE — Progress Notes (Signed)
ANTICOAGULATION CONSULT NOTE Pharmacy Consult for Heparin Indication: DVT  No Known Allergies  Patient Measurements: Height: 6\' 3"  (190.5 cm) Weight: 200 lb (90.7 kg) IBW/kg (Calculated) : 84.5  Vital Signs: Temp: 101.6 F (38.7 C) (02/01 0731) Temp Source: Axillary (02/01 0731) BP: 107/64 (02/01 0800) Pulse Rate: 139 (02/01 0800)  Labs: Recent Labs    04/15/19 0751 04/15/19 1805 04/15/19 2317 04/15/19 2317 04/16/19 0450 04/16/19 0450 04/16/19 1202 04/16/19 1939 04/17/19 0018  HGB 13.5   < > 12.2*   < > 12.8*   < > 12.7*  --  11.0*  HCT 41.1   < > 36.0*   < > 38.6*  --  39.2  --  32.8*  PLT 265   < > 231   < > 220  --  225  --  215  HEPARINUNFRC 0.48   < > 0.23*   < > 0.37   < > 0.59 0.53 0.53  CREATININE 1.00  --  1.27*  --   --   --   --   --  1.02   < > = values in this interval not displayed.    Estimated Creatinine Clearance: 119.7 mL/min (by C-G formula based on SCr of 1.02 mg/dL).   Assessment: 39 yo M with DVT s/p thrombectomy and lysis with alteplase and stenting of IVC.  Currently on heparin drip 2200 uts/hr with plans noted for lovenox -heparin level at goal  Goal of Therapy: Heparin level 0.3-0.7 units/ml Monitor platelets by anticoagulation protocol: Yes  Plan: Cont heparin 2200 units/hr Daily heparin level and CBC  Hildred Laser, PharmD Clinical Pharmacist **Pharmacist phone directory can now be found on [email or web address] ([email or web address]).  Listed under [place].

## 2019-04-17 NOTE — Progress Notes (Signed)
Referring Physician(s):[name],Y   Supervising Physician: [name]  Patient Status:  Timothy [name], Timothy [name] - In-pt  Chief Complaint:  Back pain, constipation, left leg swelling  Subjective: Pt states that his left leg is less swollen since lytic procedure this weekend; he does c/o constipation (no BM in 1 week), left flank discomfort; he has foley in place draining bloody urine; denies chills, although temp has also been elevated, last recorded 100; he is tachy as well in upper 120's but denies worsening dyspnea, sig CP or cough ; sats 96 % RA   Allergies: Patient has no known allergies.  Medications: Prior to Admission medications   Medication Sig Start Date End Date Taking? Authorizing Provider  cyclobenzaprine (FLEXERIL) 10 MG tablet Take 1 tablet (10 mg total) by mouth 3 (three) times daily as needed for muscle spasms. 04/04/19  Yes Lamptey, Myrene Galas, MD  ibuprofen (ADVIL) 600 MG tablet Take 1 tablet (600 mg total) by mouth every 6 (six) hours as needed. 04/04/19  Yes Lamptey, Myrene Galas, MD     Vital Signs: BP 115/71   Pulse (!) 128   Temp 100 F (37.8 C) (Oral)   Resp 19   Ht '6\' 3"'  (5.498 m)   Wt 200 lb (90.7 kg)   SpO2 97%   BMI 25.00 kg/m   Physical Exam awake/alert; bilateral popliteal vein access sites clean, dry, bandage in place, no active bleeding; left leg with 1+ pretibial edema, right leg with no significant edema.  Much less erythema of left lower extremity noted since lytic procedure. Distal pulses palpable.  SCDs in place  Imaging: MR BRAIN W WO CONTRAST  Result Date: 04/15/2019 CLINICAL DATA:  History of testicular cancer. Assess for metastatic disease. EXAM: MRI HEAD WITHOUT AND WITH CONTRAST TECHNIQUE: Multiplanar, multiecho pulse sequences of the brain and surrounding structures were obtained without and with intravenous contrast. CONTRAST:  41m GADAVIST GADOBUTROL 1 MMOL/ML IV SOLN COMPARISON:  None. FINDINGS: Brain: The brain has a normal appearance without  evidence of malformation, atrophy, old or acute small or large vessel infarction, mass lesion, hemorrhage, hydrocephalus or extra-axial collection. After contrast administration, no abnormal enhancement occurs. Vascular: Major vessels at the base of the brain show flow. Venous sinuses appear patent. Skull and upper cervical spine: Normal. Sinuses/Orbits: Clear/normal. Other: None significant. IMPRESSION: Normal examination.  No evidence of metastatic disease. Electronically Signed   By: MNelson ChimesM.D.   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 Patient has experienced minimal improvement since initiation of IV heparin, and as such request made for initiation of catheter directed thrombolysis for symptomatic purposes. EXAM: 1. ULTRASOUND GUIDANCE FOR VASCULAR ACCESS X2 2. FLUOROSCOPIC GUIDED PHARMACOLOGICAL AND MECHANICAL THROMBECTOMY WITH ANGIOJET DEVICE 3. BILATERAL LOWER EXTREMITY VENOGRAM AND PLACEMENT OF INFUSION CATHETERS COMPARISON:  CT chest, abdomen pelvis-04/11/2019; abdominal MRV - 04/13/2019 MEDICATIONS: Hydralazine 10 mg IV, administered near the completion of the procedure secondary to persistently elevated blood pressure. CONTRAST:  60 cc Omnipaque 300 ANESTHESIA/SEDATION: Moderate (conscious) sedation was employed during this procedure. A total of Versed 3 mg and Fentanyl 125 mcg was administered intravenously. Moderate Sedation Time: 67 minutes. The patient's level of consciousness and vital signs were monitored continuously by radiology nursing throughout the procedure under my direct supervision. FLUOROSCOPY TIME:  18 minutes, 18 seconds (3264mGy) COMPLICATIONS: None immediate. TECHNIQUE:  Informed written consent was obtained from the patient after a discussion of  the risks (including but not limited to bleeding, including nontarget bleeding given history of malignancy, infection, pulmonary embolism and necessity for venous angioplasty and/or stent placement), benefits and alternatives to treatment. Questions regarding the procedure were encouraged and answered. A timeout was performed prior to the initiation of the procedure. The patient was placed prone on the fluoroscopy table and the skin posterior to the bilateral knees was prepped and draped in the usual sterile fashion, and a sterile drape was applied covering the operative field. Maximum barrier sterile technique with sterile gowns and gloves were used for the procedure. Under direct ultrasound guidance, the left popliteal vein was access with a micropuncture kit after the overlying soft tissues were anesthetized with 1% lidocaine. An ultrasound image was saved for documentation purposed. This allowed for placement of a 8-French vascular sheath. A limited venogram was performed through the side arm of the vascular sheath. With the use of a regular glidewire, a Kumpe catheter was advanced through the left femoral, external iliac vein and through the left common iliac vein to the level of the IVC. Limited vena cavogram demonstrates complete occlusion of the distal aspect of the IVC extending to involve both left common iliac veins. Ultimately, a moderate to long segment apparently extrinsic malignant narrowing involving the mid/distal aspect of the IVC was successfully traversed with inferior venogram demonstrating patency of the more cranial infrarenal IVC. Next, with the use of a Angiojet device, approximately 250 cc of a solution of 10 mg of tPA mixed in 500 mg of saline was infused throughout the occluded segment of the IVC, left common and external iliac veins to the level of the left common femoral vein. Attention was now paid towards access of the right lower extremity popliteal vein. After the overlying soft  tissues were anesthetized with 1% lidocaine, the right popliteal vein was accessed micropuncture kit. Limited venogram was performed through the outer micro puncture sheath. This allowed for placement of a 6 French, 35 cm vascular sheath. Again, with the use of a regular glidewire, a Kumpe catheter was advanced beyond the obstructing caval lesion. Contrast injection confirmed appropriate positioning. Next, after approximately 20 minute tPA dwell, mechanical thrombectomy was performed with the Angiojet device both from the left lower extremity access as well as the right lower extremity access. Limited vena cavagram was performed via the right lower extremity access. Kumpe catheter was utilized for measurement purposes and ultimately a 90 cm/20-cm infusion catheter was placed from the right lower extremity access and a 90 cm/30 cm infusion catheter was placed from the left lower extremity access adequately covering the occluded segment of the bilateral venous systems as well as the caudal aspect of the IVC. Postprocedural spot fluoroscopic images were obtained. Both popliteal vascular sheath were secured in place. Dressings were applied. Patient's was subjectively cold by the end of the procedure with associated elevation in his blood pressure and as such he was administered 10 mg of hydralazine and bear hugger warming device was applied. The patient otherwise tolerated the procedure well without immediate postprocedural complication. FINDINGS: Sonographic evaluation demonstrates patency of the bilateral popliteal veins. Contrast injection demonstrates patency of the bilateral popliteal and superficial femoral veins. There is occlusive thrombus extending from the left common femoral vein and the right common iliac vein through the mid/caudal aspect of the IVC secondary to a moderate to long segment length presumably extrinsic malignant occlusion secondary to known adjacent bulky retroperitoneal  lymphadenopathy.  Following pharmacological and mechanical thrombectomy as detailed above, there is restored flow as demonstrated on limited right central pelvic venogram with large amount of residual thrombus. Following infusion catheter placement, both side ports traverse both occluded segments of the IVC and pelvic venous systems. IMPRESSION: 1. 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 2. Successful initiation of bilateral mechanical and pharmacologic thrombectomy with bilateral infusion catheters appropriately positioned. PLAN: The patient will be transferred to ICU for overnight lytic infusion and will return tomorrow for repeat venogram and potential intervention. Electronically Signed   By: Sandi Mariscal M.D.   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 Patient has experienced minimal improvement since initiation of IV heparin, and as such request made for initiation of catheter directed thrombolysis for symptomatic purposes. EXAM: 1. ULTRASOUND GUIDANCE FOR VASCULAR ACCESS X2 2. FLUOROSCOPIC GUIDED PHARMACOLOGICAL AND MECHANICAL THROMBECTOMY WITH ANGIOJET DEVICE 3. BILATERAL LOWER EXTREMITY VENOGRAM AND PLACEMENT OF INFUSION CATHETERS COMPARISON:  CT chest, abdomen pelvis-04/11/2019; abdominal MRV - 04/13/2019 MEDICATIONS: Hydralazine 10 mg IV, administered near the completion of the procedure secondary to persistently elevated blood pressure. CONTRAST:  60 cc Omnipaque 300 ANESTHESIA/SEDATION: Moderate (conscious) sedation was employed during this procedure. A total of Versed 3 mg and Fentanyl 125 mcg was administered intravenously. Moderate Sedation Time: 67 minutes. The  patient's level of consciousness and vital signs were monitored continuously by radiology nursing throughout the procedure under my direct supervision. FLUOROSCOPY TIME:  18 minutes, 18 seconds (301 mGy) COMPLICATIONS: None immediate. TECHNIQUE: Informed written consent was obtained from the patient after a discussion of the risks (including but not limited to bleeding, including nontarget bleeding given history of malignancy, infection, pulmonary embolism and necessity for venous angioplasty and/or stent placement), benefits and alternatives to treatment. Questions regarding the procedure were encouraged and answered. A timeout was performed prior to the initiation of the procedure. The patient was placed prone on the fluoroscopy table and the skin posterior to the bilateral knees was prepped and draped in the usual sterile fashion, and a sterile drape was applied covering the operative field. Maximum barrier sterile technique with sterile gowns and gloves were used for the procedure. Under direct ultrasound guidance, the left popliteal vein was access with a micropuncture kit after the overlying soft tissues were anesthetized with 1% lidocaine. An ultrasound image was saved for documentation purposed. This allowed for placement of a 8-French vascular sheath. A limited venogram was performed through the side arm of the vascular sheath. With the use of a regular glidewire, a Kumpe catheter was advanced through the left femoral, external iliac vein and through the left common iliac vein to the level of the IVC. Limited vena cavogram demonstrates complete occlusion of the distal aspect of the IVC extending to involve both left common iliac veins. Ultimately, a moderate to long segment apparently extrinsic malignant narrowing involving the mid/distal aspect of the IVC was successfully traversed with inferior venogram demonstrating patency of the more cranial infrarenal IVC. Next, with the use of a Angiojet device,  approximately 250 cc of a solution of 10 mg of tPA mixed in 500 mg of saline was infused throughout the occluded segment of the IVC, left common and external iliac veins to  the level of the left common femoral vein. Attention was now paid towards access of the right lower extremity popliteal vein. After the overlying soft tissues were anesthetized with 1% lidocaine, the right popliteal vein was accessed micropuncture kit. Limited venogram was performed through the outer micro puncture sheath. This allowed for placement of a 6 French, 35 cm vascular sheath. Again, with the use of a regular glidewire, a Kumpe catheter was advanced beyond the obstructing caval lesion. Contrast injection confirmed appropriate positioning. Next, after approximately 20 minute tPA dwell, mechanical thrombectomy was performed with the Angiojet device both from the left lower extremity access as well as the right lower extremity access. Limited vena cavagram was performed via the right lower extremity access. Kumpe catheter was utilized for measurement purposes and ultimately a 90 cm/20-cm infusion catheter was placed from the right lower extremity access and a 90 cm/30 cm infusion catheter was placed from the left lower extremity access adequately covering the occluded segment of the bilateral venous systems as well as the caudal aspect of the IVC. Postprocedural spot fluoroscopic images were obtained. Both popliteal vascular sheath were secured in place. Dressings were applied. Patient's was subjectively cold by the end of the procedure with associated elevation in his blood pressure and as such he was administered 10 mg of hydralazine and bear hugger warming device was applied. The patient otherwise tolerated the procedure well without immediate postprocedural complication. FINDINGS: Sonographic evaluation demonstrates patency of the bilateral popliteal veins. Contrast injection demonstrates patency of the bilateral popliteal and  superficial femoral veins. There is occlusive thrombus extending from the left common femoral vein and the right common iliac vein through the mid/caudal aspect of the IVC secondary to a moderate to long segment length presumably extrinsic malignant occlusion secondary to known adjacent bulky retroperitoneal lymphadenopathy. Following pharmacological and mechanical thrombectomy as detailed above, there is restored flow as demonstrated on limited right central pelvic venogram with large amount of residual thrombus. Following infusion catheter placement, both side ports traverse both occluded segments of the IVC and pelvic venous systems. IMPRESSION: 1. 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 2. Successful initiation of bilateral mechanical and pharmacologic thrombectomy with bilateral infusion catheters appropriately positioned. PLAN: The patient will be transferred to ICU for overnight lytic infusion and will return tomorrow for repeat venogram and potential intervention. Electronically Signed   By: Sandi Mariscal M.D.   On: 04/15/2019 17:04   US RENAL  Result Date: 04/17/2019 CLINICAL DATA:  Urinary retention.  History of testicular carcinoma. EXAM: RENAL / URINARY TRACT ULTRASOUND COMPLETE COMPARISON:  CT scan 04/11/2019 FINDINGS: Right Kidney: Renal measurements: 13.5 x 5.5 x 6.6 cm = volume: 260.0 mL . Normal renal cortical thickness and echogenicity without focal lesions or hydronephrosis. Left Kidney: Renal measurements: 12.0 x 6.8 x 5.4 cm = volume: 229.4 mL. Normal renal cortical thickness and echogenicity without focal lesions or hydronephrosis. Bladder: Decompressed by Foley catheter. Other: None. IMPRESSION: Normal sonographic appearance of both kidneys.  No hydronephrosis. Bladder decompressed by a Foley catheter. Electronically Signed   By: Marijo Sanes M.D.   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  Patient has experienced minimal improvement since initiation of IV  heparin, and as such request made for initiation of catheter directed thrombolysis for symptomatic purposes.  EXAM: 1. ULTRASOUND GUIDANCE FOR VASCULAR ACCESS X2 2. FLUOROSCOPIC GUIDED PHARMACOLOGICAL AND MECHANICAL THROMBECTOMY WITH ANGIOJET DEVICE 3. BILATERAL LOWER EXTREMITY VENOGRAM AND PLACEMENT OF INFUSION CATHETERS  COMPARISON:  CT chest, abdomen pelvis-04/11/2019; abdominal MRV - 04/13/2019  MEDICATIONS: Hydralazine 10 mg IV, administered near the completion of the procedure secondary to persistently elevated blood pressure.  CONTRAST:  60 cc Omnipaque 300  ANESTHESIA/SEDATION: Moderate (conscious) sedation was employed during this procedure. A total of Versed 3 mg and Fentanyl 125 mcg was administered intravenously.  Moderate Sedation Time: 67 minutes. The patient's level of consciousness and vital signs were monitored continuously by radiology nursing throughout the procedure under my direct supervision.  FLUOROSCOPY TIME:  18 minutes, 18 seconds (166 mGy)  COMPLICATIONS: None immediate.  TECHNIQUE: Informed written consent was obtained from the patient after a discussion of the risks (including but not limited to bleeding, including nontarget bleeding given history of malignancy, infection, pulmonary embolism and necessity for venous angioplasty and/or stent placement), benefits and alternatives to treatment. Questions regarding the procedure were encouraged and answered. A timeout was performed prior to the initiation of the procedure.  The patient was placed prone on the fluoroscopy table and the skin posterior to the bilateral knees was prepped and draped in  the usual sterile fashion, and a sterile drape was applied covering the operative field. Maximum barrier sterile technique with sterile gowns and gloves were used for the procedure.  Under direct ultrasound guidance, the left popliteal vein was access with a micropuncture kit after the overlying soft tissues were anesthetized with 1% lidocaine. An ultrasound image was saved for documentation purposed. This allowed for placement of a 8-French vascular sheath. A limited venogram was performed through the side arm of the vascular sheath.  With the use of a regular glidewire, a Kumpe catheter was advanced through the left femoral, external iliac vein and through the left common iliac vein to the level of the IVC.  Limited vena cavogram demonstrates complete occlusion of the distal aspect of the IVC extending to involve both left common iliac veins.  Ultimately, a moderate to long segment apparently extrinsic malignant narrowing involving the mid/distal aspect of the IVC was successfully traversed with inferior venogram demonstrating patency of the more cranial infrarenal IVC.  Next, with the use of a Angiojet device, approximately 250 cc of a solution of 10 mg of tPA mixed in 500 mg of saline was infused throughout the occluded segment of the IVC, left common and external iliac veins to the level of the left common femoral vein.  Attention was now paid towards access of the right lower extremity popliteal vein.  After the overlying soft tissues were anesthetized with 1% lidocaine, the right popliteal vein was accessed micropuncture kit. Limited venogram was performed through the outer micro puncture sheath. This allowed for placement of a 6 French, 35 cm vascular sheath.  Again, with the use of a regular glidewire, a Kumpe catheter was advanced beyond the obstructing caval lesion. Contrast injection confirmed appropriate positioning.  Next, after approximately 20 minute tPA dwell, mechanical thrombectomy was  performed with the Angiojet device both from the left lower extremity access as well as the right lower extremity access.  Limited vena cavagram was performed via the right lower extremity access.  Kumpe catheter was utilized for measurement purposes and ultimately a 90 cm/20-cm infusion catheter was placed from the right lower extremity access and  a 90 cm/30 cm infusion catheter was placed from the left lower extremity access adequately covering the occluded segment of the bilateral venous systems as well as the caudal aspect of the IVC.  Postprocedural spot fluoroscopic images were obtained.  Both popliteal vascular sheath were secured in place. Dressings were applied.  Patient's was subjectively cold by the end of the procedure with associated elevation in his blood pressure and as such he was administered 10 mg of hydralazine and bear hugger warming device was applied.  The patient otherwise tolerated the procedure well without immediate postprocedural complication.  FINDINGS: Sonographic evaluation demonstrates patency of the bilateral popliteal veins.  Contrast injection demonstrates patency of the bilateral popliteal and superficial femoral veins.  There is occlusive thrombus extending from the left common femoral vein and the right common iliac vein through the mid/caudal aspect of the IVC secondary to a moderate to long segment length presumably extrinsic malignant occlusion secondary to known adjacent bulky retroperitoneal lymphadenopathy.  Following pharmacological and mechanical thrombectomy as detailed above, there is restored flow as demonstrated on limited right central pelvic venogram with large amount of residual thrombus.  Following infusion catheter placement, both side ports traverse both occluded segments of the IVC and pelvic venous systems.  IMPRESSION: 1. 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 2. Successful initiation of bilateral mechanical and pharmacologic thrombectomy with bilateral infusion catheters appropriately positioned.  PLAN: The patient will be transferred to ICU for overnight lytic infusion and will return tomorrow for repeat venogram and potential intervention.   Electronically Signed   By: Sandi Mariscal M.D.   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  Patient was initiated bilateral pelvic/IVC catheter directed thrombolysis day prior (04/15/2019), and returns today following 24 hours of tPA infusion.  EXAM: 1. IR THROMB F/U EVAL ART/VEN FINAL DAY 2. FLUOROSCOPIC GUIDED ANGIOPLASTY AND STENT PLACEMENT OF THE IVC  COMPARISON:  Initiation of bilateral catheter directed pelvic venous and IVC thrombolysis - 04/15/2019; CT abdomen pelvis - 04/11/2019  MEDICATIONS: Heparin 7000 units  CONTRAST:  85 cc Omnipaque 300  ANESTHESIA/SEDATION: Moderate (conscious) sedation was employed during this procedure. A total of Versed 2 mg and Dilaudid 3 mg was administered intravenously.  Moderate Sedation Time: 128 minutes. The patient's level of consciousness and vital signs were monitored continuously by radiology nursing throughout the procedure under my direct supervision.  FLUOROSCOPY TIME:  14 minutes, 12 seconds (242.6 mGy)  COMPLICATIONS: None immediate.  TECHNIQUE: Informed written consent was obtained from the patient after a discussion of the risks, benefits and alternatives to treatment. Questions regarding the procedure were encouraged and answered. A timeout was performed prior to the initiation of the procedure.  The patient was placed prone on the fluoroscopy table and the skin posterior to the  bilateral knees as well as the external portion the existing bilateral vascular sheaths and infusion catheters was prepped and draped in usual sterile fashion. The surrounding subcutaneous tissues about both popliteal approach vascular sheath was anesthetized with 1% lidocaine with epinephrine.  Next, post lysis venograms were performed from the bilateral infusion catheters following removal of the bilateral infusion wires.  Images were reviewed and the decision was made to proceed  with additional mechanical thrombectomy.  With the use of a vertebral catheter exchange length stiff Glidewires were advanced from both existing popliteal vascular sheaths superior to the nearly obstructing caval lesion. Contrast injection confirmed appropriate positioning. Stiff glide wires were advanced into the left subclavian vein.  4000 units of heparin was administered intravenously.  Next, beginning with the left lower extremity, the existing 8 French vascular sheath was exchanged over the glidewire for a 13 [place] vascular sheath.  The 16 mm diameter Clottreiever Inari mechanical thrombectomy device was then utilized to perform 3 passes from the left popliteal approach from at and just below level of the obstructing caval lesion. Post thrombectomy images were obtained from the left lower extremity.  Next, the existing right lower extremity 6 French popliteal vein vascular sheath was exchanged over a guidewire for a 13 French vascular sheath. Again, from the right lower extremity approach, the 16 mm diameter Clottreiever Inari mechanical thrombectomy device was then utilized to perform 3 passes from the level at and just below the obstructing caval lesion. Post thrombectomy images were obtained from the right lower extremity.  An ACT level was obtained and an additional 3000 units of heparin was administered intravenously.  The long segment eccentric irregular narrowing/subtotal occlusion of the IVC was subsequently  balloon angioplastied at multiple stations with a 14 mm x 4 cm Atlas balloon. Despite adequate balloon apposition, post angioplasty venogram images demonstrate a recurrent hemodynamically significant narrowing.  As such, a 20 mm x 60 mm Venovo venous stent was deployed across the superior and mid aspect of the caval lesion and was subsequently balloon angioplastied at multiple stations initially with a 16 mm x 4 cm atlas balloon and ultimately with an 18 mm x 4 cm balloon.  Post stent deployment venogram images were obtained however there is felt to be incomplete coverage of the distal aspect of the malignant narrowing. As such, an additional 20 mm x 40 mm Venovo venous stent was deployed overlapping the mid/caudal aspect of venous stent and adequately covering the caudal aspect caval lesion.  Both overlapping venous stents were subsequently balloon angioplastied at multiple stations with the 18 mm atlas balloon and completion venogram images were obtained from both lower extremities. Images were reviewed and the procedure was terminated.  All wires, catheters and sheaths were removed from the bilateral popliteal vein accesses and hemostasis was achieved with manual compression. Dressings applied. The patient tolerated the procedure well without immediate postprocedural complication.  FINDINGS: Initial post lysis venogram performed from the left lower extremity infusion catheter demonstrates restoration of flow through the left pelvis with moderate amount residual thrombus primarily within the peripheral aspect of the left external iliac vein as well as the left common and central aspect of the left superficial femoral veins. There is passage of contrast beyond the nearly obstructing lesion involving mid/caudal aspect of the IVC.  Initial post lysis venogram performed from the right lower extremity demonstrates persistent occlusion at the level of the right common iliac vein.  Following 3 passes from each  lower extremity with the 16 mm diameter Clottreiever Inari mechanical thrombectomy device, nearly all residual clot burden within the pelvis was removed.  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 This lesion was again noted to be significantly caudal to the confluence of the bilateral renal veins.  As such, nearly  obstructing caval lesion was treated with overlapping stent deployment of 20 mm diameter Venovo venous stents which were subsequently balloon angioplastied to 18 mm diameter.  Completion images demonstrate a technically excellent result with restored brisk flow through the bilateral pelvic systems and the IVC.  There is a mild residual narrowing at the level of the mid/caudal aspect of the IVC, though not felt to result in a residual hemodynamically significant stenosis.  No evidence of complication. Specifically, no evidence of contrast extravasation or vessel dissection.  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  PLAN: - Maintain IV heparin drip until patient is successfully converted to long-term anticoagulant.  - Recommend maintaining SCD devices while in bed.   Electronically Signed   By: Sandi Mariscal M.D.   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 Patient has experienced minimal improvement since initiation of IV heparin, and as such  request made for initiation of catheter directed thrombolysis for symptomatic purposes. EXAM: 1. ULTRASOUND GUIDANCE FOR VASCULAR ACCESS X2 2. FLUOROSCOPIC GUIDED PHARMACOLOGICAL AND MECHANICAL THROMBECTOMY WITH ANGIOJET DEVICE 3. BILATERAL LOWER EXTREMITY VENOGRAM AND PLACEMENT OF INFUSION CATHETERS COMPARISON:  CT chest, abdomen pelvis-04/11/2019; abdominal MRV - 04/13/2019 MEDICATIONS: Hydralazine 10 mg IV, administered near the completion of the procedure secondary to persistently elevated blood pressure. CONTRAST:  60 cc Omnipaque 300 ANESTHESIA/SEDATION: Moderate (conscious) sedation was employed during this procedure. A total of Versed 3 mg and Fentanyl 125 mcg was administered intravenously. Moderate Sedation Time: 67 minutes. The patient's level of consciousness and vital signs were monitored continuously by radiology nursing throughout the procedure under my direct supervision. FLUOROSCOPY TIME:  18 minutes, 18 seconds (841 mGy) COMPLICATIONS: None immediate. TECHNIQUE: Informed written consent was obtained from the patient after a discussion of the risks (including but not limited to bleeding, including nontarget bleeding given history of malignancy, infection, pulmonary embolism and necessity for venous angioplasty and/or stent placement), benefits and alternatives to treatment. Questions regarding the procedure were encouraged and answered. A timeout was performed prior to the initiation of the procedure. The patient was placed prone on the fluoroscopy table and the skin posterior to the bilateral knees was prepped and draped in the usual sterile fashion, and a sterile drape was applied covering the operative field. Maximum barrier sterile technique with sterile gowns and gloves were used for the procedure. Under direct ultrasound guidance, the left popliteal vein was access with a micropuncture kit after the overlying soft tissues were anesthetized with 1% lidocaine. An ultrasound image was saved for  documentation purposed. This allowed for placement of a 8-French vascular sheath. A limited venogram was performed through the side arm of the vascular sheath. With the use of a regular glidewire, a Kumpe catheter was advanced through the left femoral, external iliac vein and through the left common iliac vein to the level of the IVC. Limited vena cavogram demonstrates complete occlusion of the distal aspect of the IVC extending to involve both left common iliac veins. Ultimately, a moderate to long segment apparently extrinsic malignant narrowing involving the mid/distal aspect of the IVC was successfully traversed with inferior venogram demonstrating patency of the more cranial infrarenal IVC. Next, with the use of a Angiojet device, approximately 250 cc  of a solution of 10 mg of tPA mixed in 500 mg of saline was infused throughout the occluded segment of the IVC, left common and external iliac veins to the level of the left common femoral vein. Attention was now paid towards access of the right lower extremity popliteal vein. After the overlying soft tissues were anesthetized with 1% lidocaine, the right popliteal vein was accessed micropuncture kit. Limited venogram was performed through the outer micro puncture sheath. This allowed for placement of a 6 French, 35 cm vascular sheath. Again, with the use of a regular glidewire, a Kumpe catheter was advanced beyond the obstructing caval lesion. Contrast injection confirmed appropriate positioning. Next, after approximately 20 minute tPA dwell, mechanical thrombectomy was performed with the Angiojet device both from the left lower extremity access as well as the right lower extremity access. Limited vena cavagram was performed via the right lower extremity access. Kumpe catheter was utilized for measurement purposes and ultimately a 90 cm/20-cm infusion catheter was placed from the right lower extremity access and a 90 cm/30 cm infusion catheter was placed from the  left lower extremity access adequately covering the occluded segment of the bilateral venous systems as well as the caudal aspect of the IVC. Postprocedural spot fluoroscopic images were obtained. Both popliteal vascular sheath were secured in place. Dressings were applied. Patient's was subjectively cold by the end of the procedure with associated elevation in his blood pressure and as such he was administered 10 mg of hydralazine and bear hugger warming device was applied. The patient otherwise tolerated the procedure well without immediate postprocedural complication. FINDINGS: Sonographic evaluation demonstrates patency of the bilateral popliteal veins. Contrast injection demonstrates patency of the bilateral popliteal and superficial femoral veins. There is occlusive thrombus extending from the left common femoral vein and the right common iliac vein through the mid/caudal aspect of the IVC secondary to a moderate to long segment length presumably extrinsic malignant occlusion secondary to known adjacent bulky retroperitoneal lymphadenopathy. Following pharmacological and mechanical thrombectomy as detailed above, there is restored flow as demonstrated on limited right central pelvic venogram with large amount of residual thrombus. Following infusion catheter placement, both side ports traverse both occluded segments of the IVC and pelvic venous systems. IMPRESSION: 1. 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 2. Successful initiation of bilateral mechanical and pharmacologic thrombectomy with bilateral infusion catheters appropriately positioned. PLAN: The patient will be transferred to ICU for overnight lytic infusion and will return tomorrow for repeat venogram and potential intervention. Electronically Signed   By: Sandi Mariscal M.D.   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 Patient has experienced minimal improvement since initiation of IV heparin, and as such request made for initiation of catheter directed thrombolysis for symptomatic purposes. EXAM: 1. ULTRASOUND GUIDANCE FOR VASCULAR ACCESS X2 2. FLUOROSCOPIC GUIDED PHARMACOLOGICAL AND MECHANICAL THROMBECTOMY WITH ANGIOJET DEVICE 3. BILATERAL LOWER EXTREMITY VENOGRAM AND PLACEMENT OF INFUSION CATHETERS COMPARISON:  CT chest, abdomen pelvis-04/11/2019; abdominal MRV - 04/13/2019 MEDICATIONS: Hydralazine 10 mg IV, administered near the completion of the procedure secondary to persistently elevated blood pressure. CONTRAST:  60 cc Omnipaque 300 ANESTHESIA/SEDATION: Moderate (conscious) sedation was employed during this procedure. A total of Versed  3 mg and Fentanyl 125 mcg was administered intravenously. Moderate Sedation Time: 67 minutes. The patient's level of consciousness and vital signs were monitored continuously by radiology nursing throughout the procedure under my direct supervision. FLUOROSCOPY TIME:  18 minutes, 18 seconds (078 mGy) COMPLICATIONS: None immediate. TECHNIQUE: Informed written consent was obtained from the patient after a discussion of the risks (including but not limited to bleeding, including nontarget bleeding given history of malignancy, infection, pulmonary embolism and necessity for venous angioplasty and/or stent placement), benefits and alternatives to treatment. Questions regarding the procedure were encouraged and answered. A timeout was performed prior to the initiation of the procedure. The patient was placed prone on the fluoroscopy table and the skin posterior to the bilateral knees was prepped and draped in the usual sterile fashion, and a sterile drape was  applied covering the operative field. Maximum barrier sterile technique with sterile gowns and gloves were used for the procedure. Under direct ultrasound guidance, the left popliteal vein was access with a micropuncture kit after the overlying soft tissues were anesthetized with 1% lidocaine. An ultrasound image was saved for documentation purposed. This allowed for placement of a 8-French vascular sheath. A limited venogram was performed through the side arm of the vascular sheath. With the use of a regular glidewire, a Kumpe catheter was advanced through the left femoral, external iliac vein and through the left common iliac vein to the level of the IVC. Limited vena cavogram demonstrates complete occlusion of the distal aspect of the IVC extending to involve both left common iliac veins. Ultimately, a moderate to long segment apparently extrinsic malignant narrowing involving the mid/distal aspect of the IVC was successfully traversed with inferior venogram demonstrating patency of the more cranial infrarenal IVC. Next, with the use of a Angiojet device, approximately 250 cc of a solution of 10 mg of tPA mixed in 500 mg of saline was infused throughout the occluded segment of the IVC, left common and external iliac veins to the level of the left common femoral vein. Attention was now paid towards access of the right lower extremity popliteal vein. After the overlying soft tissues were anesthetized with 1% lidocaine, the right popliteal vein was accessed micropuncture kit. Limited venogram was performed through the outer micro puncture sheath. This allowed for placement of a 6 French, 35 cm vascular sheath. Again, with the use of a regular glidewire, a Kumpe catheter was advanced beyond the obstructing caval lesion. Contrast injection confirmed appropriate positioning. Next, after approximately 20 minute tPA dwell, mechanical thrombectomy was performed with the Angiojet device both from the left lower extremity  access as well as the right lower extremity access. Limited vena cavagram was performed via the right lower extremity access. Kumpe catheter was utilized for measurement purposes and ultimately a 90 cm/20-cm infusion catheter was placed from the right lower extremity access and a 90 cm/30 cm infusion catheter was placed from the left lower extremity access adequately covering the occluded segment of the bilateral venous systems as well as the caudal aspect of the IVC. Postprocedural spot fluoroscopic images were obtained. Both popliteal vascular sheath were secured in place. Dressings were applied. Patient's was subjectively cold by the end of the procedure with associated elevation in his blood pressure and as such he was administered 10 mg of hydralazine and bear hugger warming device was applied. The patient otherwise tolerated the procedure well without immediate postprocedural complication. FINDINGS: Sonographic evaluation demonstrates patency of the bilateral popliteal veins. Contrast injection demonstrates patency of the  bilateral popliteal and superficial femoral veins. There is occlusive thrombus extending from the left common femoral vein and the right common iliac vein through the mid/caudal aspect of the IVC secondary to a moderate to long segment length presumably extrinsic malignant occlusion secondary to known adjacent bulky retroperitoneal lymphadenopathy. Following pharmacological and mechanical thrombectomy as detailed above, there is restored flow as demonstrated on limited right central pelvic venogram with large amount of residual thrombus. Following infusion catheter placement, both side ports traverse both occluded segments of the IVC and pelvic venous systems. IMPRESSION: 1. 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 2. Successful initiation of bilateral mechanical and  pharmacologic thrombectomy with bilateral infusion catheters appropriately positioned. PLAN: The patient will be transferred to ICU for overnight lytic infusion and will return tomorrow for repeat venogram and potential intervention. Electronically Signed   By: Sandi Mariscal M.D.   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 Patient was initiated bilateral pelvic/IVC catheter directed thrombolysis day prior (04/15/2019), and returns today following 24 hours of tPA infusion. EXAM: 1. IR THROMB F/U EVAL ART/VEN FINAL DAY 2. FLUOROSCOPIC GUIDED ANGIOPLASTY AND STENT PLACEMENT OF THE IVC COMPARISON:  Initiation of bilateral catheter directed pelvic venous and IVC thrombolysis - 04/15/2019; CT abdomen pelvis - 04/11/2019 MEDICATIONS: Heparin 7000 units CONTRAST:  85 cc Omnipaque 300 ANESTHESIA/SEDATION: Moderate (conscious) sedation was employed during this procedure. A total of Versed 2 mg and Dilaudid 3 mg was administered intravenously. Moderate Sedation Time: 128 minutes. The patient's level of consciousness and vital signs were monitored continuously by radiology nursing throughout the procedure under my direct supervision. FLUOROSCOPY TIME:  14 minutes, 12 seconds (147.8 mGy) COMPLICATIONS: None immediate. TECHNIQUE: Informed written consent was obtained from the patient after a discussion of the risks, benefits and alternatives to treatment. Questions regarding the procedure were encouraged and answered. A timeout was performed prior to the initiation of the procedure. The patient was placed prone on the fluoroscopy table and the skin posterior to the bilateral knees as well as the external portion the existing bilateral vascular sheaths and infusion  catheters was prepped and draped in usual sterile fashion. The surrounding subcutaneous tissues about both popliteal approach vascular sheath was anesthetized with 1% lidocaine with epinephrine. Next, post lysis venograms were performed from the bilateral infusion catheters following removal of the bilateral infusion wires. Images were reviewed and the decision was made to proceed with additional mechanical thrombectomy. With the use of a vertebral catheter exchange length stiff Glidewires were advanced from both existing popliteal vascular sheaths superior to the nearly obstructing caval lesion. Contrast injection confirmed appropriate positioning. Stiff glide wires were advanced into the left subclavian vein. 4000 units of heparin was administered intravenously. Next, beginning with the left lower extremity, the existing 8 French vascular sheath was exchanged over the glidewire for a 13 [place] vascular sheath. The 16 mm diameter Clottreiever Inari mechanical thrombectomy device was then utilized to perform 3 passes from the left popliteal approach from at and just below level of the obstructing caval lesion. Post thrombectomy images were obtained from the left lower extremity. Next, the existing right lower extremity 6 French popliteal vein vascular sheath was  exchanged over a guidewire for a 13 French vascular sheath. Again, from the right lower extremity approach, the 16 mm diameter Clottreiever Inari mechanical thrombectomy device was then utilized to perform 3 passes from the level at and just below the obstructing caval lesion. Post thrombectomy images were obtained from the right lower extremity. An ACT level was obtained and an additional 3000 units of heparin was administered intravenously. The long segment eccentric irregular narrowing/subtotal occlusion of the IVC was subsequently balloon angioplastied at multiple stations with a 14 mm x 4 cm Atlas balloon. Despite adequate balloon apposition, post  angioplasty venogram images demonstrate a recurrent hemodynamically significant narrowing. As such, a 20 mm x 60 mm Venovo venous stent was deployed across the superior and mid aspect of the caval lesion and was subsequently balloon angioplastied at multiple stations initially with a 16 mm x 4 cm atlas balloon and ultimately with an 18 mm x 4 cm balloon. Post stent deployment venogram images were obtained however there is felt to be incomplete coverage of the distal aspect of the malignant narrowing. As such, an additional 20 mm x 40 mm Venovo venous stent was deployed overlapping the mid/caudal aspect of venous stent and adequately covering the caudal aspect caval lesion. Both overlapping venous stents were subsequently balloon angioplastied at multiple stations with the 18 mm atlas balloon and completion venogram images were obtained from both lower extremities. Images were reviewed and the procedure was terminated. All wires, catheters and sheaths were removed from the bilateral popliteal vein accesses and hemostasis was achieved with manual compression. Dressings applied. The patient tolerated the procedure well without immediate postprocedural complication. FINDINGS: Initial post lysis venogram performed from the left lower extremity infusion catheter demonstrates restoration of flow through the left pelvis with moderate amount residual thrombus primarily within the peripheral aspect of the left external iliac vein as well as the left common and central aspect of the left superficial femoral veins. There is passage of contrast beyond the nearly obstructing lesion involving mid/caudal aspect of the IVC. Initial post lysis venogram performed from the right lower extremity demonstrates persistent occlusion at the level of the right common iliac vein. Following 3 passes from each lower extremity with the 16 mm diameter Clottreiever Inari mechanical thrombectomy device, nearly all residual clot burden within the  pelvis was removed. 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 This lesion was again noted to be significantly caudal to the confluence of the bilateral renal veins. As such, nearly obstructing caval lesion was treated with overlapping stent deployment of 20 mm diameter Venovo venous stents which were subsequently balloon angioplastied to 18 mm diameter. Completion images demonstrate a technically excellent result with restored brisk flow through the bilateral pelvic systems and the IVC. There is a mild residual narrowing at the level of the mid/caudal aspect of the IVC, though not felt to result in a residual hemodynamically significant stenosis. No evidence of complication. Specifically, no evidence of contrast extravasation or vessel dissection. 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 PLAN: - Maintain IV heparin drip until patient is successfully converted to long-term anticoagulant. - Recommend maintaining SCD devices while in bed. Electronically Signed   By: Sandi Mariscal M.D.   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 Patient has experienced minimal improvement since initiation of IV heparin, and as such request made for initiation of catheter directed thrombolysis for symptomatic purposes. EXAM: 1. ULTRASOUND GUIDANCE FOR VASCULAR ACCESS X2 2.  FLUOROSCOPIC GUIDED PHARMACOLOGICAL AND MECHANICAL THROMBECTOMY WITH ANGIOJET DEVICE 3. BILATERAL LOWER EXTREMITY VENOGRAM AND PLACEMENT OF INFUSION CATHETERS COMPARISON:  CT chest, abdomen pelvis-04/11/2019; abdominal MRV - 04/13/2019 MEDICATIONS: Hydralazine 10 mg IV, administered near the completion of the procedure secondary to persistently elevated blood pressure. CONTRAST:  60 cc Omnipaque 300 ANESTHESIA/SEDATION: Moderate (conscious) sedation was employed during this procedure. A total of Versed 3 mg and Fentanyl 125 mcg was administered intravenously. Moderate Sedation Time: 67 minutes. The patient's level of consciousness and vital signs were monitored continuously by radiology nursing throughout the procedure under my direct supervision. FLUOROSCOPY TIME:  18 minutes, 18 seconds (975 mGy) COMPLICATIONS: None immediate. TECHNIQUE: Informed written consent was obtained from the patient after a discussion of the risks (including but not limited to bleeding, including nontarget bleeding given history of malignancy, infection, pulmonary embolism and necessity for venous angioplasty and/or stent placement), benefits and alternatives to treatment. Questions regarding the procedure were encouraged and answered. A timeout was performed prior to the initiation of the procedure. The patient was placed prone on the fluoroscopy table and the skin posterior to the bilateral knees was prepped and draped in the usual sterile fashion, and a sterile drape was applied covering the operative field. Maximum barrier sterile technique with sterile gowns and gloves were used for the procedure. Under direct ultrasound guidance, the left popliteal vein was access with a micropuncture kit after the overlying soft tissues were anesthetized with 1% lidocaine. An ultrasound image was saved for documentation purposed. This allowed for placement of a 8-French vascular sheath. A limited venogram was performed through the side arm of the  vascular sheath. With the use of a regular glidewire, a Kumpe catheter was advanced through the left femoral, external iliac vein and through the left common iliac vein to the level of the IVC. Limited vena cavogram demonstrates complete occlusion of the distal aspect of the IVC extending to involve both left common iliac veins. Ultimately, a moderate to long segment apparently extrinsic malignant narrowing involving the mid/distal aspect of the IVC was successfully traversed with inferior venogram demonstrating patency of the more cranial infrarenal IVC. Next, with the use of a Angiojet device, approximately 250 cc of a solution of 10 mg of tPA mixed in 500 mg of saline was infused throughout the occluded segment of the IVC, left common and external iliac veins to the level of the left common femoral vein. Attention was now paid towards access of the right lower extremity popliteal vein. After the overlying soft tissues were anesthetized with 1% lidocaine, the right popliteal vein was accessed micropuncture kit. Limited venogram was performed through the outer micro puncture sheath. This allowed for placement of a 6 French, 35 cm vascular sheath. Again, with the use of a regular glidewire, a Kumpe catheter was advanced beyond the obstructing caval lesion. Contrast injection confirmed appropriate positioning. Next, after approximately 20 minute tPA dwell, mechanical thrombectomy was performed with the Angiojet device both from the left lower extremity access as well as the right lower extremity access. Limited vena cavagram was performed via the right lower extremity access.  Kumpe catheter was utilized for measurement purposes and ultimately a 90 cm/20-cm infusion catheter was placed from the right lower extremity access and a 90 cm/30 cm infusion catheter was placed from the left lower extremity access adequately covering the occluded segment of the bilateral venous systems as well as the caudal aspect of the IVC.  Postprocedural spot fluoroscopic images were obtained. Both popliteal vascular sheath were secured in place. Dressings were applied. Patient's was subjectively cold by the end of the procedure with associated elevation in his blood pressure and as such he was administered 10 mg of hydralazine and bear hugger warming device was applied. The patient otherwise tolerated the procedure well without immediate postprocedural complication. FINDINGS: Sonographic evaluation demonstrates patency of the bilateral popliteal veins. Contrast injection demonstrates patency of the bilateral popliteal and superficial femoral veins. There is occlusive thrombus extending from the left common femoral vein and the right common iliac vein through the mid/caudal aspect of the IVC secondary to a moderate to long segment length presumably extrinsic malignant occlusion secondary to known adjacent bulky retroperitoneal lymphadenopathy. Following pharmacological and mechanical thrombectomy as detailed above, there is restored flow as demonstrated on limited right central pelvic venogram with large amount of residual thrombus. Following infusion catheter placement, both side ports traverse both occluded segments of the IVC and pelvic venous systems. IMPRESSION: 1. 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 2. Successful initiation of bilateral mechanical and pharmacologic thrombectomy with bilateral infusion catheters appropriately positioned. PLAN: The patient will be transferred to ICU for overnight lytic infusion and will return tomorrow for repeat venogram and potential intervention. Electronically Signed   By: Sandi Mariscal M.D.   On: 04/15/2019 17:04   IR THROMB F/U EVAL ART/VEN FINAL DAY (MS)  Result Date: 04/16/2019 INDICATION: History of newly diagnosed recurrent testicular cancer, now with bulky retroperitoneal  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 Patient was initiated bilateral pelvic/IVC catheter directed thrombolysis day prior (04/15/2019), and returns today following 24 hours of tPA infusion. EXAM: 1. IR THROMB F/U EVAL ART/VEN FINAL DAY 2. FLUOROSCOPIC GUIDED ANGIOPLASTY AND STENT PLACEMENT OF THE IVC COMPARISON:  Initiation of bilateral catheter directed pelvic venous and IVC thrombolysis - 04/15/2019; CT abdomen pelvis - 04/11/2019 MEDICATIONS: Heparin 7000 units CONTRAST:  85 cc Omnipaque 300 ANESTHESIA/SEDATION: Moderate (conscious) sedation was employed during this procedure. A total of Versed 2 mg and Dilaudid 3 mg was administered intravenously. Moderate Sedation Time: 128 minutes. The patient's level of consciousness and vital signs were monitored continuously by radiology nursing throughout the procedure under my direct supervision. FLUOROSCOPY TIME:  14 minutes, 12 seconds (741.2 mGy) COMPLICATIONS: None immediate. TECHNIQUE: Informed written consent was obtained from the patient after a discussion of the risks, benefits and alternatives to treatment. Questions regarding the procedure were encouraged and answered. A timeout was performed prior to the initiation of the procedure. The patient was placed prone on the fluoroscopy table and the skin posterior to the bilateral knees as well as the external portion the existing bilateral vascular sheaths and infusion catheters was prepped and draped in usual sterile fashion. The surrounding subcutaneous tissues about both popliteal approach vascular sheath was anesthetized with 1% lidocaine with epinephrine. Next, post lysis venograms were performed from the bilateral infusion catheters following removal of the bilateral infusion wires. Images were reviewed and the decision was made to proceed with  additional mechanical thrombectomy. With the use of a vertebral catheter exchange length stiff  Glidewires were advanced from both existing popliteal vascular sheaths superior to the nearly obstructing caval lesion. Contrast injection confirmed appropriate positioning. Stiff glide wires were advanced into the left subclavian vein. 4000 units of heparin was administered intravenously. Next, beginning with the left lower extremity, the existing 8 French vascular sheath was exchanged over the glidewire for a 13 [place] vascular sheath. The 16 mm diameter Clottreiever Inari mechanical thrombectomy device was then utilized to perform 3 passes from the left popliteal approach from at and just below level of the obstructing caval lesion. Post thrombectomy images were obtained from the left lower extremity. Next, the existing right lower extremity 6 French popliteal vein vascular sheath was exchanged over a guidewire for a 13 French vascular sheath. Again, from the right lower extremity approach, the 16 mm diameter Clottreiever Inari mechanical thrombectomy device was then utilized to perform 3 passes from the level at and just below the obstructing caval lesion. Post thrombectomy images were obtained from the right lower extremity. An ACT level was obtained and an additional 3000 units of heparin was administered intravenously. The long segment eccentric irregular narrowing/subtotal occlusion of the IVC was subsequently balloon angioplastied at multiple stations with a 14 mm x 4 cm Atlas balloon. Despite adequate balloon apposition, post angioplasty venogram images demonstrate a recurrent hemodynamically significant narrowing. As such, a 20 mm x 60 mm Venovo venous stent was deployed across the superior and mid aspect of the caval lesion and was subsequently balloon angioplastied at multiple stations initially with a 16 mm x 4 cm atlas balloon and ultimately with an 18 mm x 4 cm balloon. Post stent deployment venogram images were obtained however there is felt to be incomplete coverage of the distal aspect of the  malignant narrowing. As such, an additional 20 mm x 40 mm Venovo venous stent was deployed overlapping the mid/caudal aspect of venous stent and adequately covering the caudal aspect caval lesion. Both overlapping venous stents were subsequently balloon angioplastied at multiple stations with the 18 mm atlas balloon and completion venogram images were obtained from both lower extremities. Images were reviewed and the procedure was terminated. All wires, catheters and sheaths were removed from the bilateral popliteal vein accesses and hemostasis was achieved with manual compression. Dressings applied. The patient tolerated the procedure well without immediate postprocedural complication. FINDINGS: Initial post lysis venogram performed from the left lower extremity infusion catheter demonstrates restoration of flow through the left pelvis with moderate amount residual thrombus primarily within the peripheral aspect of the left external iliac vein as well as the left common and central aspect of the left superficial femoral veins. There is passage of contrast beyond the nearly obstructing lesion involving mid/caudal aspect of the IVC. Initial post lysis venogram performed from the right lower extremity demonstrates persistent occlusion at the level of the right common iliac vein. Following 3 passes from each lower extremity with the 16 mm diameter Clottreiever Inari mechanical thrombectomy device, nearly all residual clot burden within the pelvis was removed. 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 This lesion was again noted to be significantly caudal to the confluence of the bilateral renal veins. As such, nearly obstructing caval lesion was treated with overlapping stent  deployment of 20 mm diameter Venovo venous stents  which were subsequently balloon angioplastied to 18 mm diameter. Completion images demonstrate a technically excellent result with restored brisk flow through the bilateral pelvic systems and the IVC. There is a mild residual narrowing at the level of the mid/caudal aspect of the IVC, though not felt to result in a residual hemodynamically significant stenosis. No evidence of complication. Specifically, no evidence of contrast extravasation or vessel dissection. 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 PLAN: - Maintain IV heparin drip until patient is successfully converted to long-term anticoagulant. - Recommend maintaining SCD devices while in bed. Electronically Signed   By: Sandi Mariscal M.D.   On: 04/16/2019 16:26    Labs:  CBC: Recent Labs    04/15/19 2317 04/16/19 0450 04/16/19 1202 04/17/19 0018  WBC 9.4 11.0* 9.1 11.4*  HGB 12.2* 12.8* 12.7* 11.0*  HCT 36.0* 38.6* 39.2 32.8*  PLT 231 220 225 215    COAGS: Recent Labs    04/11/19 1258  INR 1.0    BMP: Recent Labs    04/11/19 0432 04/15/19 0751 04/15/19 2317 04/17/19 0018  NA 138 134* 133* 130*  K 4.1 4.1 4.0 3.9  CL 105 96* 98 100  CO2 '26 28 24 24  ' GLUCOSE 97 95 102* 110*  BUN '10 11 16 8  ' CALCIUM 8.8* 8.9 8.6* 7.8*  CREATININE 0.86 1.00 1.27* 1.02  GFRNONAA >60 >60 >60 >60  GFRAA >60 >60 >60 >60    LIVER FUNCTION TESTS: Recent Labs    04/11/19 0432  BILITOT 0.8  AST 17  ALT 16  ALKPHOS 59  PROT 7.8  ALBUMIN 3.9    Assessment and Plan: 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  Last temp 100, heart rate 129, BP okay, WBC 11.4 up from 9.1, hemoglobin 11, down from 12.7, creatinine normal, blood cultures negative to date, bloody urine in Foley cath; urinalysis/cx pending; renal ultrasound today negative; continue current treatment/lab monitoring for now.  Patient will eventually need transition to Lovenox twice daily for 30 days before transitioning to oral anticoagulation.  He will also need aspirin 81 mg once daily for 90 days as well as compression stockings.  Patient will follow up with Dr. Maurie Boettcher) Ottis Stain, interventional radiologist at [name] [phone] as well as oncology at [facility] for further tx of seminoma (treated previously there in 2012).  Continue MiraLAX for constipation.  Both Drs. Earleen Newport and Laurence Ferrari updated on patient's status.   Electronically Signed: D. Rowe Robert, PA-C 04/17/2019, 1:48 PM   I spent a total of 20 minutes at the the patient's bedside AND on the patient's hospital floor or unit, greater than 50% of which was counseling/coordinating care for  thrombolytic therapy/mechanical thrombectomy, stent placement for malignant narrowing/ subtotal occlusion involving the mid /distal aspect of the inferior vena cava    Patient ID: Timothy [name], male   DOB: 1980/12/10, 39 y.o.   MRN: [id number]

## 2019-04-17 NOTE — Progress Notes (Signed)
Interventional Radiology Brief Note:  Met with patient to discuss the possibility of Port-A-Cath placement prior to discharge as recommended by Dr. Pascal Lux and Dr. Alen Blew.  Patient will be in need of long-term, durable venous access for ongoing therapies as well as continuous anticoagulation for at least the next month.  Discussed with patient the risks and benefits of placing Port-A-Cath now, while on IV heparin and before transitioning to oral anticoagulation, with expected plans for use in the future.  Patient requests time to discuss with his fiance.  He would like to think about this overnight.  Orders placed for n.p.o. after midnight. Continue heparin for now.  Will place on short hold on the day of procedure if moving forward.  Brynda Greathouse, MS RD PA-C 5:37 PM

## 2019-04-17 NOTE — Progress Notes (Signed)
PROGRESS NOTE    Timothy [name]  ATF:[id number] DOB: 11-Dec-1980 DOA: 04/10/2019 PCP: Patient, No Pcp Per    Brief Narrative: 39 year old male with a history of testicular carcinoma stage Ib left orchiectomy in 2012 at [facility] last seen there in 2014 lives in [place] travel to [place] a week ago to see his mom came in with complaints of left lower extremity swelling found to have extensive left lower extremity DVT with intra-abdominal and retroperitoneal lymphadenopathy concerning for metastatic cancer.  Assessment & Plan:   Principal Problem:   DVT (deep venous thrombosis) (HCC) Active Problems:   H/O testicular cancer   Lymphadenopathy, retroperitoneal   Seminoma ([place])   #1 acute left lower extremity DVT in the setting of testicular cancer-status post mechanical and pharmacological thrombolysis with stenting of the IVC for resistant malignant narrowing. Continue heparin. Patient continues to have swelling and pain in the lower extremity but has improved since admission.  #2 fever with tachycardia-obtain blood cultures urine culture.  No signs of erythema or cellulitis in his lower extremities. WBC up from 9.1-11.4 T-max 101.6 Monitor closely.  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  Will obtain renal ultrasound to see why he is retaining urine. With his elevated creatinine I will hold off on obtaining a CT of the abdomen and pelvis for now.  #4 metastatic testicular cancer-he will follow-up at [facility] for further treatments once he is discharged.    Estimated body mass index is 25 kg/m as calculated from the following:   Height as of this encounter: '6\' 3"'  (1.905 m).   Weight as of this encounter: 90.7 kg.  DVT prophylaxis: Heparin Code Status: Full code  family Communication: None Disposition Plan: Patient comes from home anticipate discharge to home.  Barriers to discharge are patient still on heparin and has fever and  tachycardia and ongoing left lower extremity swelling. Consultants:   Interventional radiology oncology  Procedures: CT-guided biopsy of the lymph node Antimicrobials none  Subjective: Resting in bed complains of a sore spot in the left lower back more painful than his legs.  Denies any chest pain shortness of breath or cough diarrhea.  Objective: Vitals:   04/17/19 0700 04/17/19 0731 04/17/19 0800 04/17/19 0900  BP: 120/66  107/64   Pulse: (!) 135  (!) 139 (!) 129  Resp: (!) 24  18 (!) 23  Temp:  (!) 101.6 F (38.7 C)    TempSrc:  Axillary    SpO2: 90%  94% 95%  Weight:      Height:        Intake/Output Summary (Last 24 hours) at 04/17/2019 1005 Last data filed at 04/17/2019 0900 Gross per 24 hour  Intake 4236.47 ml  Output 3230 ml  Net 1006.47 ml   Filed Weights   04/10/19 2005  Weight: 90.7 kg    Examination:  General exam: Appears calm and comfortable  Respiratory system: Clear to auscultation. Respiratory effort normal. Cardiovascular system: S1 & S2 heard, RRR. No JVD, murmurs, rubs, gallops or clicks. No pedal edema. Gastrointestinal system: Abdomen is nondistended, soft and nontender. No organomegaly or masses felt. Normal bowel sounds heard. Central nervous system: Alert and oriented. No focal neurological deficits. Extremities: 3+ left lower extremity edema no erythema Skin: No rashes, lesions or ulcers Psychiatry: Judgement and insight appear normal. Mood & affect appropriate.     Data Reviewed: I have personally reviewed following labs and imaging studies  CBC: Recent Labs  Lab 04/15/19 0751 04/15/19  1497 04/15/19 1805 04/15/19 2317 04/16/19 0450 04/16/19 1202 04/17/19 0018  WBC 6.7   < > 10.0 9.4 11.0* 9.1 11.4*  NEUTROABS 4.8  --   --   --   --   --  9.0*  HGB 13.5   < > 13.7 12.2* 12.8* 12.7* 11.0*  HCT 41.1   < > 39.9 36.0* 38.6* 39.2 32.8*  MCV 91.3   < > 90.1 89.8 90.8 92.0 88.9  PLT 265   < > 257 231 220 225 215   < > = values in this  interval not displayed.   Basic Metabolic Panel: Recent Labs  Lab 04/10/19 2320 04/11/19 0432 04/15/19 0751 04/15/19 2317 04/17/19 0018  NA 140 138 134* 133* 130*  K 4.3 4.1 4.1 4.0 3.9  CL 106 105 96* 98 100  CO2 '26 26 28 24 24  ' GLUCOSE 97 97 95 102* 110*  BUN '12 10 11 16 8  ' CREATININE 0.99 0.86 1.00 1.27* 1.02  CALCIUM 9.0 8.8* 8.9 8.6* 7.8*   GFR: Estimated Creatinine Clearance: 119.7 mL/min (by C-G formula based on SCr of 1.02 mg/dL). Liver Function Tests: Recent Labs  Lab 04/11/19 0432  AST 17  ALT 16  ALKPHOS 59  BILITOT 0.8  PROT 7.8  ALBUMIN 3.9   No results for input(s): LIPASE, AMYLASE in the last 168 hours. No results for input(s): AMMONIA in the last 168 hours. Coagulation Profile: Recent Labs  Lab 04/11/19 1258  INR 1.0   Cardiac Enzymes: No results for input(s): CKTOTAL, CKMB, CKMBINDEX, TROPONINI in the last 168 hours. BNP (last 3 results) No results for input(s): PROBNP in the last 8760 hours. HbA1C: No results for input(s): HGBA1C in the last 72 hours. CBG: Recent Labs  Lab 04/16/19 0729 04/16/19 1124  GLUCAP 115* 86   Lipid Profile: No results for input(s): CHOL, HDL, LDLCALC, TRIG, CHOLHDL, LDLDIRECT in the last 72 hours. Thyroid Function Tests: No results for input(s): TSH, T4TOTAL, FREET4, T3FREE, THYROIDAB in the last 72 hours. Anemia Panel: No results for input(s): VITAMINB12, FOLATE, FERRITIN, TIBC, IRON, RETICCTPCT in the last 72 hours. Sepsis Labs: No results for input(s): PROCALCITON, LATICACIDVEN in the last 168 hours.  Recent Results (from the past 240 hour(s))  SARS CORONAVIRUS 2 (TAT 6-24 HRS) Nasopharyngeal Nasopharyngeal Swab     Status: None   Collection Time: 04/11/19 12:32 AM   Specimen: Nasopharyngeal Swab  Result Value Ref Range Status   SARS Coronavirus 2 NEGATIVE NEGATIVE Final    Comment: (NOTE) SARS-CoV-2 target nucleic acids are NOT DETECTED. The SARS-CoV-2 RNA is generally detectable in upper and  lower respiratory specimens during the acute phase of infection. Negative results do not preclude SARS-CoV-2 infection, do not rule out co-infections with other pathogens, and should not be used as the sole basis for treatment or other patient management decisions. Negative results must be combined with clinical observations, patient history, and epidemiological information. The expected result is Negative. Fact Sheet for Patients: SugarRoll.be Fact Sheet for Healthcare Providers: https://www.[employer].com/ This test is not yet approved or cleared by the [place] FDA and  has been authorized for detection and/or diagnosis of SARS-CoV-2 by FDA under an Emergency Use Authorization (EUA). This EUA will remain  in effect (meaning this test can be used) for the duration of the COVID-19 declaration under Section 56 4(b)(1) of the Act, 21 U.S.C. section 360bbb-3(b)(1), unless the authorization is terminated or revoked sooner. Performed at [facility], [place] [address] [place], [place]  [address]          Radiology Studies: MR BRAIN W WO CONTRAST  Result Date: 04/15/2019 CLINICAL DATA:  History of testicular cancer. Assess for metastatic disease. EXAM: MRI HEAD WITHOUT AND WITH CONTRAST TECHNIQUE: Multiplanar, multiecho pulse sequences of the brain and surrounding structures were obtained without and with intravenous contrast. CONTRAST:  46m GADAVIST GADOBUTROL 1 MMOL/ML IV SOLN COMPARISON:  None. FINDINGS: Brain: The brain has a normal appearance without evidence of malformation, atrophy, old or acute small or large vessel infarction, mass lesion, hemorrhage, hydrocephalus or extra-axial collection. After contrast administration, no abnormal enhancement occurs. Vascular: Major vessels at the base of the brain show flow. Venous sinuses appear patent. Skull and upper cervical spine: Normal. Sinuses/Orbits: Clear/normal. Other: None  significant. IMPRESSION: Normal examination.  No evidence of metastatic disease. Electronically Signed   By: MNelson ChimesM.D.   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 Patient has experienced minimal improvement since initiation of IV heparin, and as such request made for initiation of catheter directed thrombolysis for symptomatic purposes. EXAM: 1. ULTRASOUND GUIDANCE FOR VASCULAR ACCESS X2 2. FLUOROSCOPIC GUIDED PHARMACOLOGICAL AND MECHANICAL THROMBECTOMY WITH ANGIOJET DEVICE 3. BILATERAL LOWER EXTREMITY VENOGRAM AND PLACEMENT OF INFUSION CATHETERS COMPARISON:  CT chest, abdomen pelvis-04/11/2019; abdominal MRV - 04/13/2019 MEDICATIONS: Hydralazine 10 mg IV, administered near the completion of the procedure secondary to persistently elevated blood pressure. CONTRAST:  60 cc Omnipaque 300 ANESTHESIA/SEDATION: Moderate (conscious) sedation was employed during this procedure. A total of Versed 3 mg and Fentanyl 125 mcg was administered intravenously. Moderate Sedation Time: 67 minutes. The patient's level of consciousness and vital signs were monitored continuously by radiology nursing throughout the procedure under my direct supervision. FLUOROSCOPY TIME:  18 minutes, 18 seconds (3825mGy) COMPLICATIONS: None immediate. TECHNIQUE: Informed written consent was obtained from the patient after a discussion of the risks (including but not limited to bleeding, including nontarget bleeding given history of malignancy, infection, pulmonary embolism and necessity for venous angioplasty and/or stent placement), benefits and alternatives to treatment. Questions regarding the procedure were encouraged and answered. A timeout was performed prior to the initiation of the procedure. The patient  was placed prone on the fluoroscopy table and the skin posterior to the bilateral knees was prepped and draped in the usual sterile fashion, and a sterile drape was applied covering the operative field. Maximum barrier sterile technique with sterile gowns and gloves were used for the procedure. Under direct ultrasound guidance, the left popliteal vein was access with a micropuncture kit after the overlying soft tissues were anesthetized with 1% lidocaine. An ultrasound image was saved for documentation purposed. This allowed for placement of a 8-French vascular sheath. A limited venogram was performed through the side arm of the vascular sheath. With the use of a regular glidewire, a Kumpe catheter was advanced through the left femoral, external iliac vein and through the left common iliac vein to the level of the IVC. Limited vena cavogram demonstrates complete occlusion of the distal aspect of the IVC extending to involve both left common iliac veins. Ultimately, a moderate to long segment apparently extrinsic malignant narrowing involving the mid/distal aspect of the IVC was successfully traversed with inferior venogram demonstrating patency of the more cranial infrarenal IVC. Next, with the use of a Angiojet device, approximately 250 cc of a  solution of 10 mg of tPA mixed in 500 mg of saline was infused throughout the occluded segment of the IVC, left common and external iliac veins to the level of the left common femoral vein. Attention was now paid towards access of the right lower extremity popliteal vein. After the overlying soft tissues were anesthetized with 1% lidocaine, the right popliteal vein was accessed micropuncture kit. Limited venogram was performed through the outer micro puncture sheath. This allowed for placement of a 6 French, 35 cm vascular sheath. Again, with the use of a regular glidewire, a Kumpe catheter was advanced beyond the obstructing caval lesion. Contrast injection confirmed  appropriate positioning. Next, after approximately 20 minute tPA dwell, mechanical thrombectomy was performed with the Angiojet device both from the left lower extremity access as well as the right lower extremity access. Limited vena cavagram was performed via the right lower extremity access. Kumpe catheter was utilized for measurement purposes and ultimately a 90 cm/20-cm infusion catheter was placed from the right lower extremity access and a 90 cm/30 cm infusion catheter was placed from the left lower extremity access adequately covering the occluded segment of the bilateral venous systems as well as the caudal aspect of the IVC. Postprocedural spot fluoroscopic images were obtained. Both popliteal vascular sheath were secured in place. Dressings were applied. Patient's was subjectively cold by the end of the procedure with associated elevation in his blood pressure and as such he was administered 10 mg of hydralazine and bear hugger warming device was applied. The patient otherwise tolerated the procedure well without immediate postprocedural complication. FINDINGS: Sonographic evaluation demonstrates patency of the bilateral popliteal veins. Contrast injection demonstrates patency of the bilateral popliteal and superficial femoral veins. There is occlusive thrombus extending from the left common femoral vein and the right common iliac vein through the mid/caudal aspect of the IVC secondary to a moderate to long segment length presumably extrinsic malignant occlusion secondary to known adjacent bulky retroperitoneal lymphadenopathy. Following pharmacological and mechanical thrombectomy as detailed above, there is restored flow as demonstrated on limited right central pelvic venogram with large amount of residual thrombus. Following infusion catheter placement, both side ports traverse both occluded segments of the IVC and pelvic venous systems. IMPRESSION: 1. 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 2. Successful initiation of bilateral mechanical and pharmacologic thrombectomy with bilateral infusion catheters appropriately positioned. PLAN: The patient will be transferred to ICU for overnight lytic infusion and will return tomorrow for repeat venogram and potential intervention. Electronically Signed   By: Sandi Mariscal M.D.   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 Patient has experienced minimal improvement since initiation of IV heparin, and as such request made for initiation of catheter directed thrombolysis for symptomatic purposes. EXAM: 1. ULTRASOUND GUIDANCE FOR VASCULAR ACCESS X2 2. FLUOROSCOPIC GUIDED PHARMACOLOGICAL AND MECHANICAL THROMBECTOMY WITH ANGIOJET DEVICE 3. BILATERAL LOWER EXTREMITY VENOGRAM AND PLACEMENT OF INFUSION CATHETERS COMPARISON:  CT chest, abdomen pelvis-04/11/2019; abdominal MRV - 04/13/2019 MEDICATIONS: Hydralazine 10 mg IV, administered near the completion of the procedure secondary to persistently elevated blood pressure. CONTRAST:  60 cc Omnipaque 300 ANESTHESIA/SEDATION: Moderate (conscious) sedation was employed during this procedure. A total of Versed 3 mg and Fentanyl 125  mcg was administered intravenously. Moderate Sedation Time: 67 minutes. The patient's level of consciousness and vital signs were monitored continuously by radiology nursing throughout the procedure under my direct supervision. FLUOROSCOPY TIME:  18 minutes, 18 seconds (655 mGy) COMPLICATIONS: None immediate. TECHNIQUE: Informed written consent was obtained from the patient after a discussion of the risks (including but not limited to bleeding, including  nontarget bleeding given history of malignancy, infection, pulmonary embolism and necessity for venous angioplasty and/or stent placement), benefits and alternatives to treatment. Questions regarding the procedure were encouraged and answered. A timeout was performed prior to the initiation of the procedure. The patient was placed prone on the fluoroscopy table and the skin posterior to the bilateral knees was prepped and draped in the usual sterile fashion, and a sterile drape was applied covering the operative field. Maximum barrier sterile technique with sterile gowns and gloves were used for the procedure. Under direct ultrasound guidance, the left popliteal vein was access with a micropuncture kit after the overlying soft tissues were anesthetized with 1% lidocaine. An ultrasound image was saved for documentation purposed. This allowed for placement of a 8-French vascular sheath. A limited venogram was performed through the side arm of the vascular sheath. With the use of a regular glidewire, a Kumpe catheter was advanced through the left femoral, external iliac vein and through the left common iliac vein to the level of the IVC. Limited vena cavogram demonstrates complete occlusion of the distal aspect of the IVC extending to involve both left common iliac veins. Ultimately, a moderate to long segment apparently extrinsic malignant narrowing involving the mid/distal aspect of the IVC was successfully traversed with inferior venogram demonstrating patency of the more cranial infrarenal IVC. Next, with the use of a Angiojet device, approximately 250 cc of a solution of 10 mg of tPA mixed in 500 mg of saline was infused throughout the occluded segment of the IVC, left common and external iliac veins to the level of the left common femoral vein. Attention was now paid towards access of the right lower extremity popliteal vein. After the overlying soft tissues were anesthetized with 1% lidocaine, the right  popliteal vein was accessed micropuncture kit. Limited venogram was performed through the outer micro puncture sheath. This allowed for placement of a 6 French, 35 cm vascular sheath. Again, with the use of a regular glidewire, a Kumpe catheter was advanced beyond the obstructing caval lesion. Contrast injection confirmed appropriate positioning. Next, after approximately 20 minute tPA dwell, mechanical thrombectomy was performed with the Angiojet device both from the left lower extremity access as well as the right lower extremity access. Limited vena cavagram was performed via the right lower extremity access. Kumpe catheter was utilized for measurement purposes and ultimately a 90 cm/20-cm infusion catheter was placed from the right lower extremity access and a 90 cm/30 cm infusion catheter was placed from the left lower extremity access adequately covering the occluded segment of the bilateral venous systems as well as the caudal aspect of the IVC. Postprocedural spot fluoroscopic images were obtained. Both popliteal vascular sheath were secured in place. Dressings were applied. Patient's was subjectively cold by the end of the procedure with associated elevation in his blood pressure and as such he was administered 10 mg of hydralazine and bear hugger warming device was applied. The patient otherwise tolerated the procedure well without immediate postprocedural complication. FINDINGS: Sonographic evaluation demonstrates patency of the bilateral popliteal veins. Contrast injection demonstrates patency of the bilateral popliteal and superficial femoral  veins. There is occlusive thrombus extending from the left common femoral vein and the right common iliac vein through the mid/caudal aspect of the IVC secondary to a moderate to long segment length presumably extrinsic malignant occlusion secondary to known adjacent bulky retroperitoneal lymphadenopathy. Following pharmacological and mechanical thrombectomy as  detailed above, there is restored flow as demonstrated on limited right central pelvic venogram with large amount of residual thrombus. Following infusion catheter placement, both side ports traverse both occluded segments of the IVC and pelvic venous systems. IMPRESSION: 1. 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 2. Successful initiation of bilateral mechanical and pharmacologic thrombectomy with bilateral infusion catheters appropriately positioned. PLAN: The patient will be transferred to ICU for overnight lytic infusion and will return tomorrow for repeat venogram and potential intervention. Electronically Signed   By: Sandi Mariscal M.D.   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  Patient has experienced minimal improvement since initiation of IV heparin, and as such request made for initiation of catheter directed thrombolysis for symptomatic purposes.  EXAM: 1. ULTRASOUND GUIDANCE FOR VASCULAR ACCESS X2 2. FLUOROSCOPIC GUIDED PHARMACOLOGICAL AND MECHANICAL THROMBECTOMY WITH ANGIOJET DEVICE 3. BILATERAL LOWER EXTREMITY VENOGRAM AND PLACEMENT OF INFUSION CATHETERS  COMPARISON:  CT chest, abdomen pelvis-04/11/2019; abdominal MRV - 04/13/2019  MEDICATIONS: Hydralazine 10 mg IV, administered near the completion of the procedure secondary to persistently elevated blood pressure.  CONTRAST:  60 cc Omnipaque 300  ANESTHESIA/SEDATION: Moderate (conscious) sedation was employed during this procedure. A total of Versed 3 mg and Fentanyl 125 mcg was administered intravenously.  Moderate Sedation Time: 67 minutes. The patient's level of consciousness and  vital signs were monitored continuously by radiology nursing throughout the procedure under my direct supervision.  FLUOROSCOPY TIME:  18 minutes, 18 seconds (557 mGy)  COMPLICATIONS: None immediate.  TECHNIQUE: Informed written consent was obtained from the patient after a discussion of the risks (including but not limited to bleeding, including nontarget bleeding given history of malignancy, infection, pulmonary embolism and necessity for venous angioplasty and/or stent placement), benefits and alternatives to treatment. Questions regarding the procedure were encouraged and answered. A timeout was performed prior to the initiation of the procedure.  The patient was placed prone on the fluoroscopy table and the skin posterior to the bilateral knees was prepped and draped in the usual sterile fashion, and a sterile drape was applied covering the operative field. Maximum barrier sterile technique with sterile gowns and gloves were used for the procedure.  Under direct ultrasound guidance, the left popliteal vein was access with a micropuncture kit after the overlying soft tissues were anesthetized with 1% lidocaine. An ultrasound image was saved for documentation purposed. This allowed for placement of a 8-French vascular sheath. A limited venogram was performed through the side arm of the vascular sheath.  With the use of a regular glidewire, a Kumpe catheter was advanced through the left femoral, external iliac vein and through the left common iliac vein to the level of the IVC.  Limited vena cavogram demonstrates complete occlusion of the distal aspect of the IVC extending to involve both left common iliac veins.  Ultimately, a moderate to long segment apparently extrinsic malignant narrowing involving the  mid/distal aspect of the IVC was successfully traversed with inferior venogram demonstrating patency of the more cranial infrarenal IVC.  Next, with the use of a Angiojet device, approximately 250 cc of  a solution of 10 mg of tPA mixed in 500 mg of saline was infused throughout the occluded segment of the IVC, left common and external iliac veins to the level of the left common femoral vein.  Attention was now paid towards access of the right lower extremity popliteal vein.  After the overlying soft tissues were anesthetized with 1% lidocaine, the right popliteal vein was accessed micropuncture kit. Limited venogram was performed through the outer micro puncture sheath. This allowed for placement of a 6 French, 35 cm vascular sheath.  Again, with the use of a regular glidewire, a Kumpe catheter was advanced beyond the obstructing caval lesion. Contrast injection confirmed appropriate positioning.  Next, after approximately 20 minute tPA dwell, mechanical thrombectomy was performed with the Angiojet device both from the left lower extremity access as well as the right lower extremity access.  Limited vena cavagram was performed via the right lower extremity access.  Kumpe catheter was utilized for measurement purposes and ultimately a 90 cm/20-cm infusion catheter was placed from the right lower extremity access and a 90 cm/30 cm infusion catheter was placed from the left lower extremity access adequately covering the occluded segment of the bilateral venous systems as well as the caudal aspect of the IVC.  Postprocedural spot fluoroscopic images were obtained.  Both popliteal vascular sheath were secured in place. Dressings were applied.  Patient's was subjectively cold by the end of the procedure with associated elevation in his blood pressure and as such he was administered 10 mg of hydralazine and bear hugger warming device was applied.  The patient otherwise tolerated the procedure well without immediate postprocedural complication.  FINDINGS: Sonographic evaluation demonstrates patency of the bilateral popliteal veins.  Contrast injection demonstrates patency of the bilateral popliteal and  superficial femoral veins.  There is occlusive thrombus extending from the left common femoral vein and the right common iliac vein through the mid/caudal aspect of the IVC secondary to a moderate to long segment length presumably extrinsic malignant occlusion secondary to known adjacent bulky retroperitoneal lymphadenopathy.  Following pharmacological and mechanical thrombectomy as detailed above, there is restored flow as demonstrated on limited right central pelvic venogram with large amount of residual thrombus.  Following infusion catheter placement, both side ports traverse both occluded segments of the IVC and pelvic venous systems.  IMPRESSION: 1. 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 2. Successful initiation of bilateral mechanical and pharmacologic thrombectomy with bilateral infusion catheters appropriately positioned.  PLAN: The patient will be transferred to ICU for overnight lytic infusion and will return tomorrow for repeat venogram and potential intervention.   Electronically Signed   By: Sandi Mariscal M.D.   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 Patient has experienced minimal improvement since initiation of IV heparin, and as such request made for initiation of catheter directed thrombolysis for symptomatic purposes. EXAM: 1. ULTRASOUND GUIDANCE FOR VASCULAR ACCESS X2 2. FLUOROSCOPIC GUIDED PHARMACOLOGICAL AND MECHANICAL THROMBECTOMY WITH ANGIOJET DEVICE 3. BILATERAL LOWER EXTREMITY VENOGRAM AND PLACEMENT OF  INFUSION CATHETERS COMPARISON:  CT chest, abdomen pelvis-04/11/2019; abdominal MRV - 04/13/2019 MEDICATIONS: Hydralazine  10 mg IV, administered near the completion of the procedure secondary to persistently elevated blood pressure. CONTRAST:  60 cc Omnipaque 300 ANESTHESIA/SEDATION: Moderate (conscious) sedation was employed during this procedure. A total of Versed 3 mg and Fentanyl 125 mcg was administered intravenously. Moderate Sedation Time: 67 minutes. The patient's level of consciousness and vital signs were monitored continuously by radiology nursing throughout the procedure under my direct supervision. FLUOROSCOPY TIME:  18 minutes, 18 seconds (503 mGy) COMPLICATIONS: None immediate. TECHNIQUE: Informed written consent was obtained from the patient after a discussion of the risks (including but not limited to bleeding, including nontarget bleeding given history of malignancy, infection, pulmonary embolism and necessity for venous angioplasty and/or stent placement), benefits and alternatives to treatment. Questions regarding the procedure were encouraged and answered. A timeout was performed prior to the initiation of the procedure. The patient was placed prone on the fluoroscopy table and the skin posterior to the bilateral knees was prepped and draped in the usual sterile fashion, and a sterile drape was applied covering the operative field. Maximum barrier sterile technique with sterile gowns and gloves were used for the procedure. Under direct ultrasound guidance, the left popliteal vein was access with a micropuncture kit after the overlying soft tissues were anesthetized with 1% lidocaine. An ultrasound image was saved for documentation purposed. This allowed for placement of a 8-French vascular sheath. A limited venogram was performed through the side arm of the vascular sheath. With the use of a regular glidewire, a Kumpe catheter was advanced through the left femoral, external iliac vein and through the left common iliac vein to the level of the IVC. Limited vena cavogram demonstrates complete occlusion of the distal  aspect of the IVC extending to involve both left common iliac veins. Ultimately, a moderate to long segment apparently extrinsic malignant narrowing involving the mid/distal aspect of the IVC was successfully traversed with inferior venogram demonstrating patency of the more cranial infrarenal IVC. Next, with the use of a Angiojet device, approximately 250 cc of a solution of 10 mg of tPA mixed in 500 mg of saline was infused throughout the occluded segment of the IVC, left common and external iliac veins to the level of the left common femoral vein. Attention was now paid towards access of the right lower extremity popliteal vein. After the overlying soft tissues were anesthetized with 1% lidocaine, the right popliteal vein was accessed micropuncture kit. Limited venogram was performed through the outer micro puncture sheath. This allowed for placement of a 6 French, 35 cm vascular sheath. Again, with the use of a regular glidewire, a Kumpe catheter was advanced beyond the obstructing caval lesion. Contrast injection confirmed appropriate positioning. Next, after approximately 20 minute tPA dwell, mechanical thrombectomy was performed with the Angiojet device both from the left lower extremity access as well as the right lower extremity access. Limited vena cavagram was performed via the right lower extremity access. Kumpe catheter was utilized for measurement purposes and ultimately a 90 cm/20-cm infusion catheter was placed from the right lower extremity access and a 90 cm/30 cm infusion catheter was placed from the left lower extremity access adequately covering the occluded segment of the bilateral venous systems as well as the caudal aspect of the IVC. Postprocedural spot fluoroscopic images were obtained. Both popliteal vascular sheath were secured in place. Dressings were applied. Patient's was subjectively cold by the end of the procedure with associated  elevation in his blood pressure and as such he was  administered 10 mg of hydralazine and bear hugger warming device was applied. The patient otherwise tolerated the procedure well without immediate postprocedural complication. FINDINGS: Sonographic evaluation demonstrates patency of the bilateral popliteal veins. Contrast injection demonstrates patency of the bilateral popliteal and superficial femoral veins. There is occlusive thrombus extending from the left common femoral vein and the right common iliac vein through the mid/caudal aspect of the IVC secondary to a moderate to long segment length presumably extrinsic malignant occlusion secondary to known adjacent bulky retroperitoneal lymphadenopathy. Following pharmacological and mechanical thrombectomy as detailed above, there is restored flow as demonstrated on limited right central pelvic venogram with large amount of residual thrombus. Following infusion catheter placement, both side ports traverse both occluded segments of the IVC and pelvic venous systems. IMPRESSION: 1. 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 2. Successful initiation of bilateral mechanical and pharmacologic thrombectomy with bilateral infusion catheters appropriately positioned. PLAN: The patient will be transferred to ICU for overnight lytic infusion and will return tomorrow for repeat venogram and potential intervention. Electronically Signed   By: Sandi Mariscal M.D.   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 Patient has experienced minimal improvement since initiation of IV heparin, and as such request made for initiation of catheter directed thrombolysis for symptomatic  purposes. EXAM: 1. ULTRASOUND GUIDANCE FOR VASCULAR ACCESS X2 2. FLUOROSCOPIC GUIDED PHARMACOLOGICAL AND MECHANICAL THROMBECTOMY WITH ANGIOJET DEVICE 3. BILATERAL LOWER EXTREMITY VENOGRAM AND PLACEMENT OF INFUSION CATHETERS COMPARISON:  CT chest, abdomen pelvis-04/11/2019; abdominal MRV - 04/13/2019 MEDICATIONS: Hydralazine 10 mg IV, administered near the completion of the procedure secondary to persistently elevated blood pressure. CONTRAST:  60 cc Omnipaque 300 ANESTHESIA/SEDATION: Moderate (conscious) sedation was employed during this procedure. A total of Versed 3 mg and Fentanyl 125 mcg was administered intravenously. Moderate Sedation Time: 67 minutes. The patient's level of consciousness and vital signs were monitored continuously by radiology nursing throughout the procedure under my direct supervision. FLUOROSCOPY TIME:  18 minutes, 18 seconds (751 mGy) COMPLICATIONS: None immediate. TECHNIQUE: Informed written consent was obtained from the patient after a discussion of the risks (including but not limited to bleeding, including nontarget bleeding given history of malignancy, infection, pulmonary embolism and necessity for venous angioplasty and/or stent placement), benefits and alternatives to treatment. Questions regarding the procedure were encouraged and answered. A timeout was performed prior to the initiation of the procedure. The patient was placed prone on the fluoroscopy table and the skin posterior to the bilateral knees was prepped and draped in the usual sterile fashion, and a sterile drape was applied covering the operative field. Maximum barrier sterile technique with sterile gowns and gloves were used for the procedure. Under direct ultrasound guidance, the left popliteal vein was access with a micropuncture kit after the overlying soft tissues were anesthetized with 1% lidocaine. An ultrasound image was saved for documentation purposed. This allowed for placement of a 8-French vascular  sheath. A limited venogram was performed through the side arm of the vascular sheath. With the use of a regular glidewire, a Kumpe catheter was advanced through the left femoral, external iliac vein and through the left common iliac  vein to the level of the IVC. Limited vena cavogram demonstrates complete occlusion of the distal aspect of the IVC extending to involve both left common iliac veins. Ultimately, a moderate to long segment apparently extrinsic malignant narrowing involving the mid/distal aspect of the IVC was successfully traversed with inferior venogram demonstrating patency of the more cranial infrarenal IVC. Next, with the use of a Angiojet device, approximately 250 cc of a solution of 10 mg of tPA mixed in 500 mg of saline was infused throughout the occluded segment of the IVC, left common and external iliac veins to the level of the left common femoral vein. Attention was now paid towards access of the right lower extremity popliteal vein. After the overlying soft tissues were anesthetized with 1% lidocaine, the right popliteal vein was accessed micropuncture kit. Limited venogram was performed through the outer micro puncture sheath. This allowed for placement of a 6 French, 35 cm vascular sheath. Again, with the use of a regular glidewire, a Kumpe catheter was advanced beyond the obstructing caval lesion. Contrast injection confirmed appropriate positioning. Next, after approximately 20 minute tPA dwell, mechanical thrombectomy was performed with the Angiojet device both from the left lower extremity access as well as the right lower extremity access. Limited vena cavagram was performed via the right lower extremity access. Kumpe catheter was utilized for measurement purposes and ultimately a 90 cm/20-cm infusion catheter was placed from the right lower extremity access and a 90 cm/30 cm infusion catheter was placed from the left lower extremity access adequately covering the occluded segment of  the bilateral venous systems as well as the caudal aspect of the IVC. Postprocedural spot fluoroscopic images were obtained. Both popliteal vascular sheath were secured in place. Dressings were applied. Patient's was subjectively cold by the end of the procedure with associated elevation in his blood pressure and as such he was administered 10 mg of hydralazine and bear hugger warming device was applied. The patient otherwise tolerated the procedure well without immediate postprocedural complication. FINDINGS: Sonographic evaluation demonstrates patency of the bilateral popliteal veins. Contrast injection demonstrates patency of the bilateral popliteal and superficial femoral veins. There is occlusive thrombus extending from the left common femoral vein and the right common iliac vein through the mid/caudal aspect of the IVC secondary to a moderate to long segment length presumably extrinsic malignant occlusion secondary to known adjacent bulky retroperitoneal lymphadenopathy. Following pharmacological and mechanical thrombectomy as detailed above, there is restored flow as demonstrated on limited right central pelvic venogram with large amount of residual thrombus. Following infusion catheter placement, both side ports traverse both occluded segments of the IVC and pelvic venous systems. IMPRESSION: 1. 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 2. Successful initiation of bilateral mechanical and pharmacologic thrombectomy with bilateral infusion catheters appropriately positioned. PLAN: The patient will be transferred to ICU for overnight lytic infusion and will return tomorrow for repeat venogram and potential intervention. Electronically Signed   By: Sandi Mariscal M.D.   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 Patient was initiated bilateral pelvic/IVC catheter directed thrombolysis day prior (04/15/2019), and returns today following 24 hours of tPA infusion. EXAM: 1. IR  THROMB F/U EVAL ART/VEN FINAL DAY 2. FLUOROSCOPIC GUIDED ANGIOPLASTY AND STENT PLACEMENT OF THE IVC COMPARISON:  Initiation of bilateral catheter directed pelvic venous and IVC thrombolysis - 04/15/2019; CT abdomen pelvis - 04/11/2019 MEDICATIONS: Heparin 7000 units CONTRAST:  85 cc Omnipaque 300 ANESTHESIA/SEDATION: Moderate (conscious) sedation was employed during this procedure. A total of Versed 2 mg and Dilaudid 3 mg was administered intravenously. Moderate Sedation Time: 128 minutes. The patient's level of consciousness and vital signs were monitored continuously by radiology nursing throughout the procedure under my direct supervision. FLUOROSCOPY TIME:  14 minutes, 12 seconds (818.5 mGy) COMPLICATIONS: None immediate. TECHNIQUE: Informed written consent was obtained from the patient after a discussion of the risks, benefits and alternatives to treatment. Questions regarding the procedure were encouraged and answered. A timeout was performed prior to the initiation of the procedure. The patient was placed prone on the fluoroscopy table and the skin posterior to the bilateral knees as well as the external portion the existing bilateral vascular sheaths and infusion catheters was prepped and draped in usual sterile fashion. The surrounding subcutaneous tissues about both popliteal approach vascular sheath was anesthetized with 1% lidocaine with epinephrine. Next, post lysis venograms were performed from the bilateral infusion catheters following removal of the bilateral infusion wires. Images were reviewed and the decision was made to proceed with additional  mechanical thrombectomy. With the use of a vertebral catheter exchange length stiff Glidewires were advanced from both existing popliteal vascular sheaths superior to the nearly obstructing caval lesion. Contrast injection confirmed appropriate positioning. Stiff glide wires were advanced into the left subclavian vein. 4000 units of heparin was administered intravenously. Next, beginning with the left lower extremity, the existing 8 French vascular sheath was exchanged over the glidewire for a 13 [place] vascular sheath. The 16 mm diameter [place] Inari mechanical thrombectomy device was then utilized to perform 3 passes from the left popliteal approach from at and just below level of the obstructing caval lesion. Post thrombectomy images were obtained from the left lower extremity. Next, the existing right lower extremity 6 French popliteal vein vascular sheath was exchanged over a guidewire for a 13 French vascular sheath. Again, from the right lower extremity approach, the 16 mm diameter [place] Inari mechanical thrombectomy device was then utilized to perform 3 passes from the level at and just below the obstructing caval lesion. Post thrombectomy images were obtained from the right lower extremity. An ACT level was obtained and an additional 3000 units of heparin was administered intravenously. The long segment eccentric irregular narrowing/subtotal occlusion of the IVC was subsequently balloon angioplastied at multiple stations with a 14 mm x 4 cm Atlas balloon. Despite adequate balloon apposition, post angioplasty venogram images demonstrate a recurrent hemodynamically significant narrowing. As such, a 20 mm x 60 mm Venovo venous stent was deployed across the superior and mid aspect of the caval lesion and was subsequently balloon angioplastied at multiple stations initially with a 16 mm x 4 cm atlas balloon and ultimately with an 18 mm x 4 cm balloon. Post stent deployment venogram images were  obtained however there is felt to be incomplete coverage of the distal aspect of the malignant narrowing. As such, an additional 20 mm x 40 mm Venovo venous stent was deployed overlapping the mid/caudal aspect of venous stent and adequately covering the caudal aspect caval lesion. Both overlapping venous stents were subsequently balloon angioplastied at multiple stations with the 18 mm atlas balloon and completion venogram images were obtained from both lower extremities.  Images were reviewed and the procedure was terminated. All wires, catheters and sheaths were removed from the bilateral popliteal vein accesses and hemostasis was achieved with manual compression. Dressings applied. The patient tolerated the procedure well without immediate postprocedural complication. FINDINGS: Initial post lysis venogram performed from the left lower extremity infusion catheter demonstrates restoration of flow through the left pelvis with moderate amount residual thrombus primarily within the peripheral aspect of the left external iliac vein as well as the left common and central aspect of the left superficial femoral veins. There is passage of contrast beyond the nearly obstructing lesion involving mid/caudal aspect of the IVC. Initial post lysis venogram performed from the right lower extremity demonstrates persistent occlusion at the level of the right common iliac vein. Following 3 passes from each lower extremity with the 16 mm diameter [place] Inari mechanical thrombectomy device, nearly all residual clot burden within the pelvis was removed. 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 This lesion was again noted to be significantly caudal to the confluence of the bilateral renal  veins. As such, nearly obstructing caval lesion was treated with overlapping stent deployment of 20 mm diameter Venovo venous stents which were subsequently balloon angioplastied to 18 mm diameter. Completion images demonstrate a technically excellent result with restored brisk flow through the bilateral pelvic systems and the IVC. There is a mild residual narrowing at the level of the mid/caudal aspect of the IVC, though not felt to result in a residual hemodynamically significant stenosis. No evidence of complication. Specifically, no evidence of contrast extravasation or vessel dissection. 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 PLAN: - Maintain IV heparin drip until patient is successfully converted to long-term anticoagulant. - Recommend maintaining SCD devices while in bed. Electronically Signed   By: Sandi Mariscal M.D.   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 Patient has experienced minimal improvement since initiation of IV heparin, and as such request made for initiation of catheter directed thrombolysis for symptomatic purposes. EXAM: 1. ULTRASOUND GUIDANCE FOR VASCULAR ACCESS X2 2. FLUOROSCOPIC GUIDED PHARMACOLOGICAL AND MECHANICAL THROMBECTOMY WITH ANGIOJET DEVICE 3. BILATERAL LOWER EXTREMITY VENOGRAM AND PLACEMENT OF INFUSION CATHETERS COMPARISON:  CT chest, abdomen pelvis-04/11/2019; abdominal MRV - 04/13/2019 MEDICATIONS: Hydralazine 10 mg IV, administered near the completion of the procedure secondary to persistently elevated blood pressure. CONTRAST:  60 cc Omnipaque 300  ANESTHESIA/SEDATION: Moderate (conscious) sedation was employed during this procedure. A total of Versed 3 mg and Fentanyl 125 mcg was administered intravenously. Moderate Sedation Time: 67 minutes. The patient's level of consciousness and vital signs were monitored continuously by radiology nursing throughout the procedure under my direct supervision. FLUOROSCOPY TIME:  18 minutes, 18 seconds (920 mGy) COMPLICATIONS: None immediate. TECHNIQUE: Informed written consent was obtained from the patient after a discussion of the risks (including but not limited to bleeding, including nontarget bleeding given history of malignancy, infection, pulmonary embolism and necessity for venous angioplasty and/or stent placement), benefits and alternatives to treatment. Questions regarding the procedure were encouraged and answered. A timeout was performed prior to  the initiation of the procedure. The patient was placed prone on the fluoroscopy table and the skin posterior to the bilateral knees was prepped and draped in the usual sterile fashion, and a sterile drape was applied covering the operative field. Maximum barrier sterile technique with sterile gowns and gloves were used for the procedure. Under direct ultrasound guidance, the left popliteal vein was access with a micropuncture kit after the overlying soft tissues were anesthetized with 1% lidocaine. An ultrasound image was saved for documentation purposed. This allowed for placement of a 8-French vascular sheath. A limited venogram was performed through the side arm of the vascular sheath. With the use of a regular glidewire, a Kumpe catheter was advanced through the left femoral, external iliac vein and through the left common iliac vein to the level of the IVC. Limited vena cavogram demonstrates complete occlusion of the distal aspect of the IVC extending to involve both left common iliac veins. Ultimately, a moderate to long segment apparently extrinsic malignant  narrowing involving the mid/distal aspect of the IVC was successfully traversed with inferior venogram demonstrating patency of the more cranial infrarenal IVC. Next, with the use of a Angiojet device, approximately 250 cc of a solution of 10 mg of tPA mixed in 500 mg of saline was infused throughout the occluded segment of the IVC, left common and external iliac veins to the level of the left common femoral vein. Attention was now paid towards access of the right lower extremity popliteal vein. After the overlying soft tissues were anesthetized with 1% lidocaine, the right popliteal vein was accessed micropuncture kit. Limited venogram was performed through the outer micro puncture sheath. This allowed for placement of a 6 French, 35 cm vascular sheath. Again, with the use of a regular glidewire, a Kumpe catheter was advanced beyond the obstructing caval lesion. Contrast injection confirmed appropriate positioning. Next, after approximately 20 minute tPA dwell, mechanical thrombectomy was performed with the Angiojet device both from the left lower extremity access as well as the right lower extremity access. Limited vena cavagram was performed via the right lower extremity access. Kumpe catheter was utilized for measurement purposes and ultimately a 90 cm/20-cm infusion catheter was placed from the right lower extremity access and a 90 cm/30 cm infusion catheter was placed from the left lower extremity access adequately covering the occluded segment of the bilateral venous systems as well as the caudal aspect of the IVC. Postprocedural spot fluoroscopic images were obtained. Both popliteal vascular sheath were secured in place. Dressings were applied. Patient's was subjectively cold by the end of the procedure with associated elevation in his blood pressure and as such he was administered 10 mg of hydralazine and bear hugger warming device was applied. The patient otherwise tolerated the procedure well without  immediate postprocedural complication. FINDINGS: Sonographic evaluation demonstrates patency of the bilateral popliteal veins. Contrast injection demonstrates patency of the bilateral popliteal and superficial femoral veins. There is occlusive thrombus extending from the left common femoral vein and the right common iliac vein through the mid/caudal aspect of the IVC secondary to a moderate to long segment length presumably extrinsic malignant occlusion secondary to known adjacent bulky retroperitoneal lymphadenopathy. Following pharmacological and mechanical thrombectomy as detailed above, there is restored flow as demonstrated on limited right central pelvic venogram with large amount of residual thrombus. Following infusion catheter placement, both side ports traverse both occluded segments of the IVC and pelvic venous systems. IMPRESSION: 1. 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 2. Successful initiation of bilateral mechanical and pharmacologic thrombectomy with bilateral infusion catheters appropriately positioned. PLAN: The patient will be transferred to ICU for overnight lytic infusion and will return tomorrow for repeat venogram and potential intervention. Electronically Signed   By: Sandi Mariscal M.D.   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 Patient was initiated bilateral pelvic/IVC catheter directed thrombolysis day prior (04/15/2019), and returns today following 24 hours of tPA infusion. EXAM: 1. IR THROMB F/U EVAL ART/VEN FINAL DAY 2. FLUOROSCOPIC GUIDED ANGIOPLASTY AND STENT PLACEMENT OF THE IVC COMPARISON:  Initiation of  bilateral catheter directed pelvic venous and IVC thrombolysis - 04/15/2019; CT abdomen pelvis - 04/11/2019 MEDICATIONS: Heparin 7000 units CONTRAST:  85 cc Omnipaque 300 ANESTHESIA/SEDATION: Moderate (conscious) sedation was employed during this procedure. A total of Versed 2 mg and Dilaudid 3 mg was administered intravenously. Moderate Sedation Time: 128 minutes. The patient's level of consciousness and vital signs were monitored continuously by radiology nursing throughout the procedure under my direct supervision. FLUOROSCOPY TIME:  14 minutes, 12 seconds (786.7 mGy) COMPLICATIONS: None immediate. TECHNIQUE: Informed written consent was obtained from the patient after a discussion of the risks, benefits and alternatives to treatment. Questions regarding the procedure were encouraged and answered. A timeout was performed prior to the initiation of the procedure. The patient was placed prone on the fluoroscopy table and the skin posterior to the bilateral knees as well as the external portion the existing bilateral vascular sheaths and infusion catheters was prepped and draped in usual sterile fashion. The surrounding subcutaneous tissues about both popliteal approach vascular sheath was anesthetized with 1% lidocaine with epinephrine. Next, post lysis venograms were performed from the bilateral infusion catheters following removal of the bilateral infusion wires. Images were reviewed and the decision was made to proceed with additional mechanical thrombectomy. With the use of a vertebral catheter exchange length stiff Glidewires were advanced from both existing popliteal vascular sheaths superior to the nearly obstructing caval lesion. Contrast injection confirmed appropriate positioning. Stiff glide wires were advanced into the left subclavian vein. 4000 units of heparin was administered intravenously. Next, beginning with the left lower extremity, the existing 8 French vascular sheath was exchanged over the  glidewire for a 13 [place] vascular sheath. The 16 mm diameter Clottreiever Inari mechanical thrombectomy device was then utilized to perform 3 passes from the left popliteal approach from at and just below level of the obstructing caval lesion. Post thrombectomy images were obtained from the left lower extremity. Next, the existing right lower extremity 6 French popliteal vein vascular sheath was exchanged over a guidewire for a 13 French vascular sheath. Again, from the right lower extremity approach, the 16 mm diameter Clottreiever Inari mechanical thrombectomy device was then utilized to perform 3 passes from the level at and just below the obstructing caval lesion. Post thrombectomy images were obtained from the right lower extremity. An ACT level was obtained and an additional 3000 units of heparin was administered intravenously. The long segment eccentric irregular narrowing/subtotal occlusion of the IVC was subsequently balloon angioplastied at multiple stations with a 14 mm x 4 cm Atlas balloon. Despite adequate balloon apposition, post angioplasty venogram images demonstrate a recurrent hemodynamically significant narrowing.  As such, a 20 mm x 60 mm Venovo venous stent was deployed across the superior and mid aspect of the caval lesion and was subsequently balloon angioplastied at multiple stations initially with a 16 mm x 4 cm atlas balloon and ultimately with an 18 mm x 4 cm balloon. Post stent deployment venogram images were obtained however there is felt to be incomplete coverage of the distal aspect of the malignant narrowing. As such, an additional 20 mm x 40 mm Venovo venous stent was deployed overlapping the mid/caudal aspect of venous stent and adequately covering the caudal aspect caval lesion. Both overlapping venous stents were subsequently balloon angioplastied at multiple stations with the 18 mm atlas balloon and completion venogram images were obtained from both lower extremities. Images were  reviewed and the procedure was terminated. All wires, catheters and sheaths were removed from the bilateral popliteal vein accesses and hemostasis was achieved with manual compression. Dressings applied. The patient tolerated the procedure well without immediate postprocedural complication. FINDINGS: Initial post lysis venogram performed from the left lower extremity infusion catheter demonstrates restoration of flow through the left pelvis with moderate amount residual thrombus primarily within the peripheral aspect of the left external iliac vein as well as the left common and central aspect of the left superficial femoral veins. There is passage of contrast beyond the nearly obstructing lesion involving mid/caudal aspect of the IVC. Initial post lysis venogram performed from the right lower extremity demonstrates persistent occlusion at the level of the right common iliac vein. Following 3 passes from each lower extremity with the 16 mm diameter Clottreiever Inari mechanical thrombectomy device, nearly all residual clot burden within the pelvis was removed. 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 This lesion was again noted to be significantly caudal to the confluence of the bilateral renal veins. As such, nearly obstructing caval lesion was treated with overlapping stent deployment of 20 mm diameter Venovo venous stents which were subsequently balloon angioplastied to 18 mm diameter. Completion images demonstrate a technically excellent result with restored brisk flow through the bilateral pelvic systems and the IVC. There is a mild residual narrowing at the level of the mid/caudal aspect of the IVC, though not felt to result in a residual hemodynamically significant  stenosis. No evidence of complication. Specifically, no evidence of contrast extravasation or vessel dissection. 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 PLAN: - Maintain IV heparin drip until patient is successfully converted to long-term anticoagulant. - Recommend maintaining SCD devices while in bed. Electronically Signed   By: Sandi Mariscal M.D.   On: 04/16/2019 16:26        Scheduled Meds: . Chlorhexidine Gluconate Cloth  6 each Topical Daily  . polyethylene glycol  17 g Oral Daily  . sodium chloride flush  3 mL Intravenous Q12H   Continuous Infusions: . sodium chloride 50 mL/hr at 04/16/19 1200  . sodium chloride 100 mL/hr at 04/17/19 0900  . alteplase (LIMB ISCHEMIA) 10 mg in normal saline (0.02 mg/mL) infusion Stopped (04/16/19 1243)  . heparin 2,200 Units/hr (04/17/19 0900)     LOS: 6 days     Georgette Shell, MD [facility]  If 7PM-7AM, please contact night-coverage [email or web address] Password [email or web address] 04/17/2019, 10:05 AM

## 2019-04-18 ENCOUNTER — Inpatient Hospital Stay (HOSPITAL_COMMUNITY): Payer: BLUE CROSS/BLUE SHIELD

## 2019-04-18 DIAGNOSIS — R140 Abdominal distension (gaseous): Secondary | ICD-10-CM

## 2019-04-18 LAB — CBC
HCT: 25.4 % — ABNORMAL LOW (ref 39.0–52.0)
Hemoglobin: 8.3 g/dL — ABNORMAL LOW (ref 13.0–17.0)
MCH: 30.1 pg (ref 26.0–34.0)
MCHC: 32.7 g/dL (ref 30.0–36.0)
MCV: 92 fL (ref 80.0–100.0)
Platelets: 254 10*3/uL (ref 150–400)
RBC: 2.76 MIL/uL — ABNORMAL LOW (ref 4.22–5.81)
RDW: 13.5 % (ref 11.5–15.5)
WBC: 14.1 10*3/uL — ABNORMAL HIGH (ref 4.0–10.5)
nRBC: 0 % (ref 0.0–0.2)

## 2019-04-18 LAB — COMPREHENSIVE METABOLIC PANEL
ALT: 19 U/L (ref 0–44)
AST: 30 U/L (ref 15–41)
Albumin: 2.3 g/dL — ABNORMAL LOW (ref 3.5–5.0)
Alkaline Phosphatase: 89 U/L (ref 38–126)
Anion gap: 10 (ref 5–15)
BUN: 6 mg/dL (ref 6–20)
CO2: 23 mmol/L (ref 22–32)
Calcium: 7.7 mg/dL — ABNORMAL LOW (ref 8.9–10.3)
Chloride: 99 mmol/L (ref 98–111)
Creatinine, Ser: 0.91 mg/dL (ref 0.61–1.24)
GFR calc Af Amer: >60 mL/min (ref >60)
GFR calc non Af Amer: >60 mL/min (ref >60)
Glucose, Bld: 105 mg/dL — ABNORMAL HIGH (ref 70–99)
Potassium: 4.2 mmol/L (ref 3.5–5.1)
Sodium: 132 mmol/L — ABNORMAL LOW (ref 135–145)
Total Bilirubin: 1 mg/dL (ref 0.3–1.2)
Total Protein: 5.5 g/dL — ABNORMAL LOW (ref 6.5–8.1)

## 2019-04-18 LAB — URINE CULTURE: Culture: NO GROWTH

## 2019-04-18 LAB — LACTIC ACID, PLASMA
Lactic Acid, Venous: 1.3 mmol/L (ref 0.5–1.9)
Lactic Acid, Venous: 1.2 mmol/L (ref 0.5–1.9)

## 2019-04-18 LAB — PROCALCITONIN
Procalcitonin: 0.42 ng/mL
Procalcitonin: 0.46 ng/mL

## 2019-04-18 LAB — HEPARIN LEVEL (UNFRACTIONATED)
Heparin Unfractionated: 0.21 IU/mL — ABNORMAL LOW (ref 0.30–0.70)
Heparin Unfractionated: 0.28 IU/mL — ABNORMAL LOW (ref 0.30–0.70)
Heparin Unfractionated: 0.47 IU/mL (ref 0.30–0.70)

## 2019-04-18 MED ORDER — PANTOPRAZOLE SODIUM 40 MG IV SOLR
40.0000 mg | Freq: Two times a day (BID) | INTRAVENOUS | Status: DC
  Administered 2019-04-18 – 2019-04-29 (×23): 40 mg via INTRAVENOUS
  Filled 2019-04-18 (×23): qty 40

## 2019-04-18 MED ORDER — METOPROLOL TARTRATE 5 MG/5ML IV SOLN
5.0000 mg | Freq: Once | INTRAVENOUS | Status: AC
  Administered 2019-04-18: 17:00:00 5 mg via INTRAVENOUS
  Filled 2019-04-18: qty 5

## 2019-04-18 MED ORDER — VANCOMYCIN HCL 2000 MG/400ML IV SOLN
2000.0000 mg | Freq: Once | INTRAVENOUS | Status: AC
  Administered 2019-04-18: 11:00:00 2000 mg via INTRAVENOUS
  Filled 2019-04-18: qty 400

## 2019-04-18 MED ORDER — SODIUM CHLORIDE 0.9 % IV BOLUS: 500.0000 mL | Freq: Once | INTRAVENOUS | Status: DC

## 2019-04-18 MED ORDER — SODIUM CHLORIDE 0.9 % IV SOLN
2.0000 g | Freq: Three times a day (TID) | INTRAVENOUS | Status: DC
  Administered 2019-04-18 – 2019-04-26 (×24): 2 g via INTRAVENOUS
  Filled 2019-04-18 (×29): qty 2

## 2019-04-18 MED ORDER — SODIUM CHLORIDE 0.9 % IV BOLUS
500.0000 mL | Freq: Once | INTRAVENOUS | Status: AC
  Administered 2019-04-18: 500 mL via INTRAVENOUS

## 2019-04-18 MED ORDER — IOHEXOL 300 MG/ML  SOLN
100.0000 mL | Freq: Once | INTRAMUSCULAR | Status: AC | PRN
  Administered 2019-04-18: 11:00:00 100 mL via INTRAVENOUS

## 2019-04-18 MED ORDER — METOCLOPRAMIDE HCL 5 MG/ML IJ SOLN
10.0000 mg | Freq: Four times a day (QID) | INTRAMUSCULAR | Status: DC | PRN
  Administered 2019-04-18: 09:00:00 10 mg via INTRAVENOUS
  Filled 2019-04-18: qty 2

## 2019-04-18 MED ORDER — VANCOMYCIN HCL 1250 MG/250ML IV SOLN
1250.0000 mg | Freq: Three times a day (TID) | INTRAVENOUS | Status: DC
  Administered 2019-04-18 – 2019-04-20 (×6): 1250 mg via INTRAVENOUS
  Filled 2019-04-18 (×8): qty 250

## 2019-04-18 MED ORDER — SODIUM CHLORIDE 0.9 % IV SOLN: INTRAVENOUS | Status: DC

## 2019-04-18 MED ORDER — SODIUM CHLORIDE 0.9 % IV SOLN
1.0000 g | Freq: Two times a day (BID) | INTRAVENOUS | Status: DC
  Filled 2019-04-18 (×2): qty 1

## 2019-04-18 MED ORDER — PHENOL 1.4 % MT LIQD
1.0000 | OROMUCOSAL | Status: DC | PRN
  Administered 2019-04-18: 15:00:00 1 via OROMUCOSAL
  Filled 2019-04-18: qty 177

## 2019-04-18 NOTE — Plan of Care (Signed)
Timothy [name] has had increased abdominal pain this shift. Abd assessed and PRN meds utilized to manage the pain and was effective. Abdomen found to be tender to palpation and distended. Report given and day shift RN updated on POC and patient's current status.   Problem: Education: Goal: Knowledge of General Education information will improve Description: Including pain rating scale, medication(s)/side effects and non-pharmacologic comfort measures Outcome: Progressing   Problem: Health Behavior/Discharge Planning: Goal: Ability to manage health-related needs will improve Outcome: Progressing   Problem: Clinical Measurements: Goal: Ability to maintain clinical measurements within normal limits will improve Outcome: Progressing   Problem: Activity: Goal: Risk for activity intolerance will decrease Outcome: Progressing   Problem: Pain Managment: Goal: General experience of comfort will improve Outcome: Progressing

## 2019-04-18 NOTE — Significant Event (Signed)
Rapid Response Event Note  Overview: Time Called: 1624 Arrival Time: 1630 Event Type: MEWS  Initial Focused Assessment: Patient admitted with DVT, currently on heparin gtt He has fever of unknown origin per MD note, on Vancomycin and cefepime Ileus  Hx of testicular CA    Vital Signs MEWS/VS Documentation      04/18/2019 1337 04/18/2019 1550 04/18/2019 1600 04/18/2019 1631   MEWS Score:  3  4  4  6    MEWS Score Color:  Yellow  Red  Red  Red   Resp:  (!) 23  --  (!) 24  (!) 22   Pulse:  (!) 114  --  (!) 134  (!) 136   BP:  121/74  --  125/82  128/76   Temp:  --  --  --  (!) 103.1 F (39.5 C)   O2 Device:  Room Air  --  --  --   Level of Consciousness:  Alert  --  --  --        Interventions: 500cc NS bolus 5 mg Lopressor IV Tylenol PR Ice packs IS Encourage ambulation  Plan of Care (if not transferred): Red MEWS VS frequency  q 78min x4, q 66min x2, q 1hr x3, q 4 hr x 3  RN to call if patient becomes hypotensive, if decreased mental status or confusion or if increased WOB   Event Summary:   at      at          Raliegh Ip

## 2019-04-18 NOTE — Evaluation (Addendum)
Physical Therapy Evaluation Patient Details Name: Timothy [name] MRN: [id number] DOB: Jul 29, 1980 Today's Date: 04/18/2019   History of Present Illness  Pt is a 39 y.o. male admitted 04/10/19 with LLE swelling and back pain; pt lives in [place] but drove down to [place] to visit mother ~1 wk ago. Found to have extensive LLE DVT; also with significant lymphadenopathy in the retroperitoneal area concerning for metastatic disease of unknown primary. Biopsy consistent with metastatic seminoma. S/p mechanical and pharmacological thrombolysis with stenting of the IVC for resistant malignant narrowing. Pt with ilieus 2/1. 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  Clinical Impression  Patient presents with generalized weakness, deconditioning, pain, impaired balance and impaired mobility s/p above. 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 Today, pt requires Mod A of 2 for standing and taking a few steps to get to bed, limited mainly by pain in groin and abdomen and weakness. Pt with HR up to 125 bpm with activity. Complaining of abdominal pain/distention- with known ileus. Pt plans to d/c home to [place] for follow up care and pending hospital course will likely need follow up PT services. Pt has to negotiate stairs to get into his home. Reports his fiance can work remotely and can assist with home. At this time, recommending CIR. However if pt improves with mobility and plans to get back to [place], recommend HHPT. Will follow acutely.    Follow Up Recommendations Supervision for mobility/OOB;CIR    Equipment Recommendations  Rolling walker with 5" wheels    Recommendations for Other Services       Precautions / Restrictions Precautions Precautions: Fall Restrictions Weight Bearing Restrictions: No      Mobility  Bed Mobility Overal bed mobility: Needs Assistance Bed Mobility: Sit to Sidelying         Sit to  sidelying: Mod assist;+2 for physical assistance;HOB elevated General bed mobility comments: Assist to bring LEs into bed and reposition in supine with HOB elevated for ease. Increased time.  Transfers Overall transfer level: Needs assistance Equipment used: 2 person hand held assist Transfers: Sit to/from [facility] Sit to Stand: Mod assist;+2 physical assistance Stand pivot transfers: Mod assist;+2 physical assistance       General transfer comment: Assist of 2 to stand from w/c with help for forward translation, flexed trunk. Able to take a few steps to get to chair with assist for weight shifting and balance.  Ambulation/Gait             General Gait Details: Deferred due to pain.  Stairs            Wheelchair Mobility    Modified Rankin (Stroke Patients Only)       Balance Overall balance assessment: Needs assistance Sitting-balance support: Feet supported;Bilateral upper extremity supported Sitting balance-Leahy Scale: Fair Sitting balance - Comments: supervision for safety, use of UEs as position of comfort due to pain. Postural control: Posterior lean Standing balance support: During functional activity Standing balance-Leahy Scale: Poor Standing balance comment: Requires external support for standing balance.                             Pertinent Vitals/Pain Pain Assessment: Faces Faces Pain Scale: Hurts whole lot Pain Location: groin, abdomen, everywhere Pain Descriptors / Indicators: Sore;Aching;Guarding;Grimacing Pain Intervention(s): Repositioned;Monitored during session;Limited activity within patient's tolerance;Patient requesting pain meds-RN notified  Home Living Family/patient expects to be discharged to:: Private residence Living Arrangements: Spouse/significant other(fiance) Available Help at Discharge: Family Type of Home: House Home Access: Stairs to enter Entrance Stairs-Rails: Right Entrance  Stairs-Number of Steps: 5 + 3 Home Layout: Two level Home Equipment: None      Prior Function Level of Independence: Independent         Comments: Pt from [place], works as an Forensic psychologist. visiting mother in [place]. Drives. Reports fiance can work remotely and help him at home at d/c.     Hand Dominance        Extremity/Trunk Assessment   Upper Extremity Assessment Upper Extremity Assessment: Defer to OT evaluation    Lower Extremity Assessment Lower Extremity Assessment: Generalized weakness(Swelling BLEs)    Cervical / Trunk Assessment Cervical / Trunk Assessment: Normal  Communication   Communication: No difficulties  Cognition Arousal/Alertness: Awake/alert Behavior During Therapy: WFL for tasks assessed/performed Overall Cognitive Status: Within Functional Limits for tasks assessed                                        General Comments General comments (skin integrity, edema, etc.): HR up to 125 bpm during session. Sp02 in high 90s on RA.    Exercises     Assessment/Plan    PT Assessment Patient needs continued PT services  PT Problem List Decreased strength;Decreased mobility;Pain;Decreased balance;Decreased knowledge of use of DME;Decreased activity tolerance;Cardiopulmonary status limiting activity;Decreased skin integrity       PT Treatment Interventions Therapeutic activities;Gait training;Therapeutic exercise;Patient/family education;Balance training;Functional mobility training;Stair training;DME instruction;Wheelchair mobility training;Manual techniques    PT Goals (Current goals can be found in the Care Plan section)  Acute Rehab PT Goals Patient Stated Goal: to get back home and to PLOF PT Goal Formulation: With patient Time For Goal Achievement: 05/02/19 Potential to Achieve Goals: Good    Frequency Min 3X/week   Barriers to discharge Inaccessible home environment stairs    Co-evaluation PT/OT/SLP  Co-Evaluation/Treatment: Yes Reason for Co-Treatment: To address functional/ADL transfers;For patient/therapist safety PT goals addressed during session: Mobility/safety with mobility         AM-PAC PT "6 Clicks" Mobility  Outcome Measure Help needed turning from your back to your side while in a flat bed without using bedrails?: A Little Help needed moving from lying on your back to sitting on the side of a flat bed without using bedrails?: A Lot Help needed moving to and from a bed to a chair (including a wheelchair)?: A Lot Help needed standing up from a chair using your arms (e.g., wheelchair or bedside chair)?: A Lot Help needed to walk in hospital room?: A Lot Help needed climbing 3-5 steps with a railing? : Total 6 Click Score: 12    End of Session   Activity Tolerance: Patient limited by pain;Patient tolerated treatment well Patient left: in bed;with call bell/phone within reach;with bed alarm set;with nursing/sitter in room Nurse Communication: Mobility status PT Visit Diagnosis: Pain;Difficulty in walking, not elsewhere classified (R26.2);Muscle weakness (generalized) (M62.81) Pain - part of body: (groin, abdomen, everywhere)    Time: [id number] PT Time Calculation (min) (ACUTE ONLY): 24 min   Charges:   PT Evaluation $PT Eval Moderate Complexity: 1 Mod          Marisa Severin, PT, DPT Acute Rehabilitation Services Pager [phone] Office [place] 04/18/2019, 3:27  PM   

## 2019-04-18 NOTE — Progress Notes (Signed)
Pt arrived to the unit from 2 Heart.  Pt is Alert and oriented x4.  Vital signs as followed: B/P 121/74, HR 114 RR 23.  Yellow Mews since this AM.  MD notified, will continue to monitor

## 2019-04-18 NOTE — Progress Notes (Signed)
I stopped by patient's room this morning to discuss possible port placement - he states that he discussed the port last night with his family and at this time he would like to hold off on port placement. He stated that he has too much going on right now and does not want to deal with something else at this time. Offered to stop by again tomorrow to see if he would like to proceed and patient declined, he stated he will let his primary team know if he would like to move forward with port placement.  Order for port will be cancelled - please replace order if patient changes his mind.   Candiss Norse, PA-C

## 2019-04-18 NOTE — Progress Notes (Addendum)
PROGRESS NOTE    Timothy [name]  [id number] DOB: June 28, 1980 DOA: 04/10/2019 PCP: Patient, No Pcp Per    Brief Narrative: 39 year old male with history of testicular carcinoma stage Ib followed at [facility] had right orchiectomy lives in [place] travel to [place] now admitted with left lower extremity extensive DVT with intra-abdominal and retroperitoneal lymphadenopathy concerning for metastatic cancer.  Assessment & Plan:   Principal Problem:   DVT (deep venous thrombosis) (HCC) Active Problems:   H/O testicular cancer   Lymphadenopathy, retroperitoneal   Seminoma (HCC)   Acute deep vein thrombosis (DVT) of left lower extremity (HCC)   Elevated serum creatinine   #1 acute left lower extremity DVT with bulky retroperitoneal lymphadenopathy and IVC occlusion in the setting of testicular cancer-status post mechanical and pharmacological thrombolysis with stenting of the IVC for resistant malignant narrowing. Continue heparin for now.  Will switch to Lovenox 1 mg/kg every 12 closer to when he is ready for discharge. Lovenox to be continued for 30 days once discharged then transition to oral anticoagulation 90 days of aspirin 81 mg daily Bilateral lower extremity compression stockings at least knee-high Out of bed ambulate PT consult Will DC Foley once he is able to ambulate  #2 fever with tachycardia-cultures have been negative so far he is still spiking temp with a T-max of 102.1.  Left lower extremity with warmth to touch compared to yesterday.   Vanco and cefepime empirically  Labs from today pending  #3 new onset urinary retention with history of metastatic seminoma.  Renal ultrasound showed no evidence of hydronephrosis. CT abdomen and pelvis today.   #4 metastatic testicular cancer-he will follow-up at [facility] for further treatments once he is discharged.  Appreciate oncology and IR input reviewed.  #5 ileus with abdominal distention and abdominal  tenderness.  Keep electrolytes normal.  Out of bed ambulate.  Minimize narcotics.  KUB in a.m.  KUB shows ileus today.  Per Dr. Stephenie Acres. Lavon Paganini, Assistant Professor of Radiology, has kindly agreed to see Mr Panzarella in continuity of his current vascular care, when Mr Walworth returns home.  Scheduling: [place]: [phone]  Dr. Ottis Stain has also kindly arranged follow-up with the Medical Oncology Department of [facility], and Mr Silverstone should have a pending office appointment upon arrival.   Estimated body mass index is 27.11 kg/m as calculated from the following:   Height as of this encounter: 6\' 3"  (1.905 m).   Weight as of this encounter: 98.4 kg.  DVT prophylaxis: IV heparin Code Status: Full code Family Communication: Discussed with patient  disposition Plan: Patient came from home plan is to return to home.  Barriers to discharge-patient spiking fever and tachycardic with unknown source of infection and currently have postop ileus  Consultants:   Interventional radiology  Procedures: CT-guided biopsy of the lymph node and mechanical and thrombolytic intervention to left lower extremity for acute DVT Antimicrobials: Vanco and cefepime 04/18/2019  Subjective: Patient resting in bed complaining of abdominal pain reports he had ulcer in his stomach long time ago feels the pain is similar not passing much gas hurts when he eats  Objective: Vitals:   04/18/19 0650 04/18/19 0700 04/18/19 0800 04/18/19 0900  BP:  111/69 117/69 117/77  Pulse:  (!) 118 (!) 121 (!) 118  Resp:      Temp: 100.3 F (37.9 C)     TempSrc: Oral     SpO2:  93% 97% 96%  Weight:  Height:        Intake/Output Summary (Last 24 hours) at 04/18/2019 0957 Last data filed at 04/18/2019 0900 Gross per 24 hour  Intake 3147.38 ml  Output 4695 ml  Net -1547.62 ml   Filed Weights   04/10/19 2005 04/18/19 0600  Weight: 90.7 kg 98.4 kg    Examination:  General exam:  Appears calm and comfortable  Respiratory system: Clear to auscultation. Respiratory effort normal. Cardiovascular system: S1 & S2 heard, RRR. No JVD, murmurs, rubs, gallops or clicks. No pedal edema. Gastrointestinal system: Abdomen is distended, soft and nontender. No organomegaly or masses felt.  Diminished bowel sounds heard. Central nervous system: Alert and oriented. No focal neurological deficits. Extremities: 2+ pitting edema decreased compared to yesterday however more warm to touch compared to yesterday.   Skin: No rashes, lesions or ulcers Psychiatry: Judgement and insight appear normal. Mood & affect appropriate.     Data Reviewed: I have personally reviewed following labs and imaging studies  CBC: Recent Labs  Lab 04/15/19 0751 04/15/19 0751 04/15/19 1805 04/15/19 2317 04/16/19 0450 04/16/19 1202 04/17/19 0018  WBC 6.7   < > 10.0 9.4 11.0* 9.1 11.4*  NEUTROABS 4.8  --   --   --   --   --  9.0*  HGB 13.5   < > 13.7 12.2* 12.8* 12.7* 11.0*  HCT 41.1   < > 39.9 36.0* 38.6* 39.2 32.8*  MCV 91.3   < > 90.1 89.8 90.8 92.0 88.9  PLT 265   < > 257 231 220 225 215   < > = values in this interval not displayed.   Basic Metabolic Panel: Recent Labs  Lab 04/15/19 0751 04/15/19 2317 04/17/19 0018  NA 134* 133* 130*  K 4.1 4.0 3.9  CL 96* 98 100  CO2 28 24 24   GLUCOSE 95 102* 110*  BUN 11 16 8   CREATININE 1.00 1.27* 1.02  CALCIUM 8.9 8.6* 7.8*   GFR: Estimated Creatinine Clearance: 119.7 mL/min (by C-G formula based on SCr of 1.02 mg/dL). Liver Function Tests: No results for input(s): AST, ALT, ALKPHOS, BILITOT, PROT, ALBUMIN in the last 168 hours. No results for input(s): LIPASE, AMYLASE in the last 168 hours. No results for input(s): AMMONIA in the last 168 hours. Coagulation Profile: Recent Labs  Lab 04/11/19 1258  INR 1.0   Cardiac Enzymes: No results for input(s): CKTOTAL, CKMB, CKMBINDEX, TROPONINI in the last 168 hours. BNP (last 3 results) No  results for input(s): PROBNP in the last 8760 hours. HbA1C: No results for input(s): HGBA1C in the last 72 hours. CBG: Recent Labs  Lab 04/16/19 0729 04/16/19 1124  GLUCAP 115* 86   Lipid Profile: No results for input(s): CHOL, HDL, LDLCALC, TRIG, CHOLHDL, LDLDIRECT in the last 72 hours. Thyroid Function Tests: No results for input(s): TSH, T4TOTAL, FREET4, T3FREE, THYROIDAB in the last 72 hours. Anemia Panel: No results for input(s): VITAMINB12, FOLATE, FERRITIN, TIBC, IRON, RETICCTPCT in the last 72 hours. Sepsis Labs: No results for input(s): PROCALCITON, LATICACIDVEN in the last 168 hours.  Recent Results (from the past 240 hour(s))  SARS CORONAVIRUS 2 (TAT 6-24 HRS) Nasopharyngeal Nasopharyngeal Swab     Status: None   Collection Time: 04/11/19 12:32 AM   Specimen: Nasopharyngeal Swab  Result Value Ref Range Status   SARS Coronavirus 2 NEGATIVE NEGATIVE Final    Comment: (NOTE) SARS-CoV-2 target nucleic acids are NOT DETECTED. The SARS-CoV-2 RNA is generally detectable in upper and lower respiratory specimens during the acute  phase of infection. Negative results do not preclude SARS-CoV-2 infection, do not rule out co-infections with other pathogens, and should not be used as the sole basis for treatment or other patient management decisions. Negative results must be combined with clinical observations, patient history, and epidemiological information. The expected result is Negative. Fact Sheet for Patients: SugarRoll.be Fact Sheet for Healthcare Providers: https://www.[employer].com/ This test is not yet approved or cleared by the [place] FDA and  has been authorized for detection and/or diagnosis of SARS-CoV-2 by FDA under an Emergency Use Authorization (EUA). This EUA will remain  in effect (meaning this test can be used) for the duration of the COVID-19 declaration under Section 56 4(b)(1) of the Act, 21  U.S.C. section 360bbb-3(b)(1), unless the authorization is terminated or revoked sooner. Performed at [facility], [place] [address], [place], [place] [address]   Culture, blood (routine x 2)     Status: None (Preliminary result)   Collection Time: 04/17/19 10:15 AM   Specimen: BLOOD RIGHT HAND  Result Value Ref Range Status   Specimen Description BLOOD RIGHT HAND  Final   Special Requests   Final    BOTTLES DRAWN AEROBIC ONLY Blood Culture results may not be optimal due to an inadequate volume of blood received in culture bottles Performed at [facility], [place] [address], [place], [place] [address]    Culture NO GROWTH < 24 HOURS  Final   Report Status PENDING  Incomplete  Culture, blood (routine x 2)     Status: None (Preliminary result)   Collection Time: 04/17/19 10:20 AM   Specimen: BLOOD  Result Value Ref Range Status   Specimen Description BLOOD LEFT ANTECUBITAL  Final   Special Requests   Final    BOTTLES DRAWN AEROBIC AND ANAEROBIC Blood Culture adequate volume Performed at [facility], [place] [address], [place], [place] [address]    Culture NO GROWTH < 24 HOURS  Final   Report Status PENDING  Incomplete  Culture, Urine     Status: None   Collection Time: 04/17/19 10:43 AM   Specimen: Urine, Random  Result Value Ref Range Status   Specimen Description URINE, RANDOM  Final   Special Requests NONE  Final   Culture   Final    NO GROWTH Performed at [facility], [place] [address] [place],  [address]    Report Status 04/18/2019 FINAL  Final         Radiology Studies: US RENAL  Result Date: 04/17/2019 CLINICAL DATA:  Urinary retention.  History of testicular carcinoma. EXAM: RENAL / URINARY TRACT ULTRASOUND COMPLETE COMPARISON:  CT scan 04/11/2019 FINDINGS: Right Kidney: Renal measurements: 13.5 x 5.5 x 6.6 cm = volume: 260.0 mL . Normal renal cortical thickness and echogenicity without focal lesions or hydronephrosis. Left Kidney: Renal  measurements: 12.0 x 6.8 x 5.4 cm = volume: 229.4 mL. Normal renal cortical thickness and echogenicity without focal lesions or hydronephrosis. Bladder: Decompressed by Foley catheter. Other: None. IMPRESSION: Normal sonographic appearance of both kidneys.  No hydronephrosis. Bladder decompressed by a Foley catheter. Electronically Signed   By: Marijo Sanes M.D.   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  Patient was initiated bilateral pelvic/IVC catheter directed thrombolysis day prior (04/15/2019), and  returns today following 24 hours of tPA infusion.  EXAM: 1. IR THROMB F/U EVAL ART/VEN FINAL DAY 2. FLUOROSCOPIC GUIDED ANGIOPLASTY AND STENT PLACEMENT OF THE IVC  COMPARISON:  Initiation of bilateral catheter directed pelvic venous and IVC thrombolysis - 04/15/2019; CT abdomen pelvis - 04/11/2019  MEDICATIONS: Heparin 7000 units  CONTRAST:  85 cc Omnipaque 300  ANESTHESIA/SEDATION: Moderate (conscious) sedation was employed during this procedure. A total of Versed 2 mg and Dilaudid 3 mg was administered intravenously.  Moderate Sedation Time: 128 minutes. The patient's level of consciousness and vital signs were monitored continuously by radiology nursing throughout the procedure under my direct supervision.  FLUOROSCOPY TIME:  14 minutes, 12 seconds (Q000111Q mGy)  COMPLICATIONS: None immediate.  TECHNIQUE: Informed written consent was obtained from the patient after a discussion of the risks, benefits and alternatives to treatment. Questions regarding the procedure were encouraged and answered. A timeout was performed prior to the initiation of the procedure.  The patient was placed prone on the fluoroscopy table and the skin posterior to the bilateral  knees as well as the external portion the existing bilateral vascular sheaths and infusion catheters was prepped and draped in usual sterile fashion. The surrounding subcutaneous tissues about both popliteal approach vascular sheath was anesthetized with 1% lidocaine with epinephrine.  Next, post lysis venograms were performed from the bilateral infusion catheters following removal of the bilateral infusion wires.  Images were reviewed and the decision was made to proceed with additional mechanical thrombectomy.  With the use of a vertebral catheter exchange length stiff Glidewires were advanced from both existing popliteal vascular sheaths superior to the nearly obstructing caval lesion. Contrast injection confirmed appropriate positioning. Stiff glide wires were advanced into the left subclavian vein.  4000 units of heparin was administered intravenously.  Next, beginning with the left lower extremity, the existing 8 French vascular sheath was exchanged over the glidewire for a 13 [place] vascular sheath.  The 16 mm diameter Clottreiever Inari mechanical thrombectomy device was then utilized to perform 3 passes from the left popliteal approach from at and just below level of the obstructing caval lesion. Post thrombectomy images were obtained from the left lower extremity.  Next, the existing right lower extremity 6 French popliteal vein vascular sheath was exchanged over a guidewire for a 13 French vascular sheath. Again, from the right lower extremity approach, the 16 mm diameter Clottreiever Inari mechanical thrombectomy device was then utilized to perform 3 passes from the level at and just below the obstructing caval lesion. Post thrombectomy images were obtained from the right lower extremity.  An ACT level was obtained and an additional 3000 units of heparin was administered intravenously.  The long segment eccentric irregular narrowing/subtotal occlusion of the IVC was subsequently balloon  angioplastied at multiple stations with a 14 mm x 4 cm Atlas balloon. Despite adequate balloon apposition, post angioplasty venogram images demonstrate a recurrent hemodynamically significant narrowing.  As such, a 20 mm x 60 mm Venovo venous stent was deployed across the superior and mid aspect of the caval lesion and was subsequently balloon angioplastied at multiple stations initially with a 16 mm x 4 cm atlas balloon and ultimately with an 18 mm x 4 cm balloon.  Post stent deployment venogram images were obtained however there is felt to be incomplete coverage of the distal aspect of the malignant narrowing. As such, an additional 20 mm x 40 mm Venovo venous stent was deployed overlapping the mid/caudal aspect of venous stent and adequately covering  the caudal aspect caval lesion.  Both overlapping venous stents were subsequently balloon angioplastied at multiple stations with the 18 mm atlas balloon and completion venogram images were obtained from both lower extremities. Images were reviewed and the procedure was terminated.  All wires, catheters and sheaths were removed from the bilateral popliteal vein accesses and hemostasis was achieved with manual compression. Dressings applied. The patient tolerated the procedure well without immediate postprocedural complication.  FINDINGS: Initial post lysis venogram performed from the left lower extremity infusion catheter demonstrates restoration of flow through the left pelvis with moderate amount residual thrombus primarily within the peripheral aspect of the left external iliac vein as well as the left common and central aspect of the left superficial femoral veins. There is passage of contrast beyond the nearly obstructing lesion involving mid/caudal aspect of the IVC.  Initial post lysis venogram performed from the right lower extremity demonstrates persistent occlusion at the level of the right common iliac vein.  Following 3 passes from each lower  extremity with the 16 mm diameter Clottreiever Inari mechanical thrombectomy device, nearly all residual clot burden within the pelvis was removed.  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 This lesion was again noted to be significantly caudal to the confluence of the bilateral renal veins.  As such, nearly obstructing caval lesion was treated with overlapping stent deployment of 20 mm diameter Venovo venous stents which were subsequently balloon angioplastied to 18 mm diameter.  Completion images demonstrate a technically excellent result with restored brisk flow through the bilateral pelvic systems and the IVC.  There is a mild residual narrowing at the level of the mid/caudal aspect of the IVC, though not felt to result in a residual hemodynamically significant stenosis.  No evidence of complication. Specifically, no evidence of contrast extravasation or vessel dissection.  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  PLAN: - Maintain IV heparin drip until patient is successfully converted to long-term anticoagulant.  - Recommend maintaining SCD devices while in bed.   Electronically Signed   By: Sandi Mariscal M.D.   On: 04/16/2019 16:26   DG Abd Portable 1V  Result Date: 04/18/2019 CLINICAL DATA:  Abdominal pain and distension EXAM: PORTABLE ABDOMEN - 1 VIEW COMPARISON:  04/11/2019 FINDINGS: Supine frontal view of the abdomen and pelvis excludes the hemidiaphragms, right flank, and lower pelvis by collimation. There is diffuse gaseous distention of the colon, likely reflecting postoperative  ileus. Stable position of the IVC stent. No masses or abnormal calcifications. IMPRESSION: 1. Distended gas-filled colon consistent with postprocedural ileus. 2. IVC stent as above Electronically Signed   By: Randa Ngo M.D.   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 Patient was initiated bilateral pelvic/IVC catheter directed thrombolysis day prior (04/15/2019), and returns today following 24 hours of tPA infusion. EXAM: 1. IR THROMB F/U EVAL ART/VEN FINAL DAY 2. FLUOROSCOPIC GUIDED ANGIOPLASTY AND STENT PLACEMENT OF THE IVC COMPARISON:  Initiation of bilateral catheter directed pelvic venous and IVC thrombolysis - 04/15/2019; CT abdomen  pelvis - 04/11/2019 MEDICATIONS: Heparin 7000 units CONTRAST:  85 cc Omnipaque 300 ANESTHESIA/SEDATION: Moderate (conscious) sedation was employed during this procedure. A total of Versed 2 mg and Dilaudid 3 mg was administered intravenously. Moderate Sedation Time: 128 minutes. The patient's level of consciousness and vital signs were monitored continuously by radiology nursing throughout the procedure under my direct supervision. FLUOROSCOPY TIME:  14 minutes, 12 seconds (Q000111Q mGy) COMPLICATIONS: None immediate. TECHNIQUE: Informed written consent was obtained from the patient after a discussion of the risks, benefits and alternatives to treatment. Questions regarding the procedure were encouraged and answered. A timeout was performed prior to the initiation of the procedure. The patient was placed prone on the fluoroscopy table and the skin posterior to the bilateral knees as well as the external portion the existing bilateral vascular sheaths and infusion catheters was prepped and draped in usual sterile  fashion. The surrounding subcutaneous tissues about both popliteal approach vascular sheath was anesthetized with 1% lidocaine with epinephrine. Next, post lysis venograms were performed from the bilateral infusion catheters following removal of the bilateral infusion wires. Images were reviewed and the decision was made to proceed with additional mechanical thrombectomy. With the use of a vertebral catheter exchange length stiff Glidewires were advanced from both existing popliteal vascular sheaths superior to the nearly obstructing caval lesion. Contrast injection confirmed appropriate positioning. Stiff glide wires were advanced into the left subclavian vein. 4000 units of heparin was administered intravenously. Next, beginning with the left lower extremity, the existing 8 French vascular sheath was exchanged over the glidewire for a 13 [place] vascular sheath. The 16 mm diameter Clottreiever Inari mechanical thrombectomy device was then utilized to perform 3 passes from the left popliteal approach from at and just below level of the obstructing caval lesion. Post thrombectomy images were obtained from the left lower extremity. Next, the existing right lower extremity 6 French popliteal vein vascular sheath was exchanged over a guidewire for a 13 French vascular sheath. Again, from the right lower extremity approach, the 16 mm diameter Clottreiever Inari mechanical thrombectomy device was then utilized to perform 3 passes from the level at and just below the obstructing caval lesion. Post thrombectomy images were obtained from the right lower extremity. An ACT level was obtained and an additional 3000 units of heparin was administered intravenously. The long segment eccentric irregular narrowing/subtotal occlusion of the IVC was subsequently balloon angioplastied at multiple stations with a 14 mm x 4 cm Atlas balloon. Despite adequate balloon apposition, post angioplasty venogram images demonstrate a recurrent  hemodynamically significant narrowing. As such, a 20 mm x 60 mm Venovo venous stent was deployed across the superior and mid aspect of the caval lesion and was subsequently balloon angioplastied at multiple stations initially with a 16 mm x 4 cm atlas balloon and ultimately with an 18 mm x 4 cm balloon. Post stent deployment venogram images were obtained however there is felt to be incomplete coverage of the distal aspect of the malignant narrowing. As such, an additional 20 mm x 40 mm Venovo venous stent was deployed overlapping the mid/caudal aspect of venous stent and adequately covering the caudal aspect caval lesion. Both overlapping venous stents were subsequently balloon angioplastied at multiple stations with the 18 mm atlas balloon and completion venogram images were obtained from both lower extremities. Images were reviewed and the procedure was terminated. All wires, catheters and sheaths were removed from the bilateral popliteal vein accesses and hemostasis was achieved with manual compression. Dressings applied. The patient  tolerated the procedure well without immediate postprocedural complication. FINDINGS: Initial post lysis venogram performed from the left lower extremity infusion catheter demonstrates restoration of flow through the left pelvis with moderate amount residual thrombus primarily within the peripheral aspect of the left external iliac vein as well as the left common and central aspect of the left superficial femoral veins. There is passage of contrast beyond the nearly obstructing lesion involving mid/caudal aspect of the IVC. Initial post lysis venogram performed from the right lower extremity demonstrates persistent occlusion at the level of the right common iliac vein. Following 3 passes from each lower extremity with the 16 mm diameter Clottreiever Inari mechanical thrombectomy device, nearly all residual clot burden within the pelvis was removed. Despite balloon angioplasty at  multiple stations to 14 mm diameter with excellent balloon apposition, post angioplasty inferior vena cavogram demonstrates a recurrent hemodynamically significant malignant narrowing/subtotal occlusion involving the mid/distal aspect of IVC (series 22). This lesion was again noted to be significantly caudal to the confluence of the bilateral renal veins. As such, nearly obstructing caval lesion was treated with overlapping stent deployment of 20 mm diameter Venovo venous stents which were subsequently balloon angioplastied to 18 mm diameter. Completion images demonstrate a technically excellent result with restored brisk flow through the bilateral pelvic systems and the IVC. There is a mild residual narrowing at the level of the mid/caudal aspect of the IVC, though not felt to result in a residual hemodynamically significant stenosis. No evidence of complication. Specifically, no evidence of contrast extravasation or vessel dissection. 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 PLAN: - Maintain IV heparin drip until patient is successfully converted to long-term anticoagulant. - Recommend maintaining SCD devices while in bed. Electronically Signed   By: Sandi Mariscal M.D.   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 Patient was initiated bilateral pelvic/IVC catheter directed thrombolysis day prior (04/15/2019), and returns today following 24 hours of tPA infusion. EXAM: 1. IR THROMB F/U EVAL ART/VEN FINAL DAY 2. FLUOROSCOPIC GUIDED ANGIOPLASTY AND STENT PLACEMENT OF THE IVC COMPARISON:   Initiation of bilateral catheter directed pelvic venous and IVC thrombolysis - 04/15/2019; CT abdomen pelvis - 04/11/2019 MEDICATIONS: Heparin 7000 units CONTRAST:  85 cc Omnipaque 300 ANESTHESIA/SEDATION: Moderate (conscious) sedation was employed during this procedure. A total of Versed 2 mg and Dilaudid 3 mg was administered intravenously. Moderate Sedation Time: 128 minutes. The patient's level of consciousness and vital signs were monitored continuously by radiology nursing throughout the procedure under my direct supervision. FLUOROSCOPY TIME:  14 minutes, 12 seconds ([id number]) COMPLICATIONS: None immediate. TECHNIQUE: Informed written consent was obtained from the patient after a discussion of the risks, benefits and alternatives to treatment. Questions regarding the procedure were encouraged and answered. A timeout was performed prior to the initiation of the procedure. The patient was placed prone on the fluoroscopy table and the skin posterior to the bilateral knees as well as the external portion the existing bilateral vascular sheaths and infusion catheters was prepped and draped in usual sterile fashion. The surrounding subcutaneous tissues about both popliteal approach vascular sheath was anesthetized with 1% lidocaine with epinephrine. Next, post lysis venograms were performed from the bilateral infusion catheters  following removal of the bilateral infusion wires. Images were reviewed and the decision was made to proceed with additional mechanical thrombectomy. With the use of a vertebral catheter exchange length stiff Glidewires were advanced from both existing popliteal vascular sheaths superior to the nearly obstructing caval lesion. Contrast injection confirmed appropriate positioning. Stiff glide wires were advanced into the left subclavian vein. 4000 units of heparin was administered intravenously. Next, beginning with the left lower extremity, the existing 8 French vascular sheath was  exchanged over the glidewire for a 13 [place] vascular sheath. The 16 mm diameter Clottreiever Inari mechanical thrombectomy device was then utilized to perform 3 passes from the left popliteal approach from at and just below level of the obstructing caval lesion. Post thrombectomy images were obtained from the left lower extremity. Next, the existing right lower extremity 6 French popliteal vein vascular sheath was exchanged over a guidewire for a 13 French vascular sheath. Again, from the right lower extremity approach, the 16 mm diameter Clottreiever Inari mechanical thrombectomy device was then utilized to perform 3 passes from the level at and just below the obstructing caval lesion. Post thrombectomy images were obtained from the right lower extremity. An ACT level was obtained and an additional 3000 units of heparin was administered intravenously. The long segment eccentric irregular narrowing/subtotal occlusion of the IVC was subsequently balloon angioplastied at multiple stations with a 14 mm x 4 cm Atlas balloon. Despite adequate balloon apposition, post angioplasty venogram images demonstrate a recurrent hemodynamically significant narrowing. As such, a 20 mm x 60 mm Venovo venous stent was deployed across the superior and mid aspect of the caval lesion and was subsequently balloon angioplastied at multiple stations initially with a 16 mm x 4 cm atlas balloon and ultimately with an 18 mm x 4 cm balloon. Post stent deployment venogram images were obtained however there is felt to be incomplete coverage of the distal aspect of the malignant narrowing. As such, an additional 20 mm x 40 mm Venovo venous stent was deployed overlapping the mid/caudal aspect of venous stent and adequately covering the caudal aspect caval lesion. Both overlapping venous stents were subsequently balloon angioplastied at multiple stations with the 18 mm atlas balloon and completion venogram images were obtained from both lower  extremities. Images were reviewed and the procedure was terminated. All wires, catheters and sheaths were removed from the bilateral popliteal vein accesses and hemostasis was achieved with manual compression. Dressings applied. The patient tolerated the procedure well without immediate postprocedural complication. FINDINGS: Initial post lysis venogram performed from the left lower extremity infusion catheter demonstrates restoration of flow through the left pelvis with moderate amount residual thrombus primarily within the peripheral aspect of the left external iliac vein as well as the left common and central aspect of the left superficial femoral veins. There is passage of contrast beyond the nearly obstructing lesion involving mid/caudal aspect of the IVC. Initial post lysis venogram performed from the right lower extremity demonstrates persistent occlusion at the level of the right common iliac vein. Following 3 passes from each lower extremity with the 16 mm diameter Clottreiever Inari mechanical thrombectomy device, nearly all residual clot burden within the pelvis was removed. 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 This lesion was again noted to be significantly caudal to the confluence of the bilateral renal veins. As such,  nearly obstructing caval lesion was treated with overlapping stent deployment of 20 mm diameter Venovo venous stents which were subsequently balloon angioplastied to 18 mm diameter. Completion images demonstrate a technically excellent result with restored brisk flow through the bilateral pelvic systems and the IVC. There is a mild residual narrowing at the level of the mid/caudal aspect of the IVC, though not felt to result in a residual  hemodynamically significant stenosis. No evidence of complication. Specifically, no evidence of contrast extravasation or vessel dissection. 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 PLAN: - Maintain IV heparin drip until patient is successfully converted to long-term anticoagulant. - Recommend maintaining SCD devices while in bed. Electronically Signed   By: Sandi Mariscal M.D.   On: 04/16/2019 16:26        Scheduled Meds: . Chlorhexidine Gluconate Cloth  6 each Topical Daily  . pantoprazole (PROTONIX) IV  40 mg Intravenous Q12H  . polyethylene glycol  17 g Oral Daily  . sodium chloride flush  3 mL Intravenous Q12H   Continuous Infusions: . sodium chloride 50 mL/hr at 04/16/19 1200  . sodium chloride 100 mL/hr at 04/18/19 0306  . sodium chloride 100 mL/hr at 04/18/19 0900  . alteplase (LIMB ISCHEMIA) 10 mg in normal saline (0.02 mg/mL) infusion Stopped (04/16/19 1243)  . ceFEPime (MAXIPIME) IV    . heparin 2,400 Units/hr (04/18/19 0900)  . vancomycin       LOS: 7 days     Georgette Shell, MD [facility]  If 7PM-7AM, please contact night-coverage [email or web address] Password [facility] 04/18/2019, 9:57 AM

## 2019-04-18 NOTE — Progress Notes (Signed)
Pharmacy Antibiotic Note  Timothy [name] is a 39 y.o. male with recent fever. Pharmacy has been consulted for vancomycin dosing (he is also on cefepime) -WBC= 11.4, tmax= 102.1, SCr= 1.02  Plan: -vancomycin 2000mg  IV x1 (request for one dose only; if continues will dose as 1250 mg IV q8h; estimated AUC= 502) -Change cefepime to 2gm IV q8h -Will follow renal function, cultures and clinical progress   Height: 6\' 3"  (190.5 cm) Weight: 216 lb 14.9 oz (98.4 kg) IBW/kg (Calculated) : 84.5  Temp (24hrs), Avg:100 F (37.8 C), Min:98.7 F (37.1 C), Max:102.1 F (38.9 C)  Recent Labs  Lab 04/15/19 0751 04/15/19 0751 04/15/19 1805 04/15/19 2317 04/16/19 0450 04/16/19 1202 04/17/19 0018  WBC 6.7   < > 10.0 9.4 11.0* 9.1 11.4*  CREATININE 1.00  --   --  1.27*  --   --  1.02   < > = values in this interval not displayed.    Estimated Creatinine Clearance: 119.7 mL/min (by C-G formula based on SCr of 1.02 mg/dL).    No Known Allergies  Antimicrobials this admission: 2/2 cefepime 2/2 vanc  Dose adjustments this admission:   Microbiology results: 2/1 urine- neg 2/1 blood x2 - ngtd  Thank you for allowing pharmacy to be a part of this patient's care.  Hildred Laser, PharmD Clinical Pharmacist **Pharmacist phone directory can now be found on [email or web address] ([email or web address]).  Listed under [place].

## 2019-04-18 NOTE — Progress Notes (Signed)
ANTICOAGULATION CONSULT NOTE Pharmacy Consult for Heparin Indication: DVT  No Known Allergies  Patient Measurements: Height: 6\' 3"  (190.5 cm) Weight: 216 lb 14.9 oz (98.4 kg) IBW/kg (Calculated) : 84.5  Vital Signs: Temp: 99.6 F (37.6 C) (02/02 2118) Temp Source: Oral (02/02 2118) BP: 132/79 (02/02 2118) Pulse Rate: 124 (02/02 2118)  Labs: Recent Labs    04/15/19 2317 04/16/19 0450 04/16/19 1202 04/16/19 1939 04/17/19 0018 04/17/19 0018 04/18/19 0221 04/18/19 1051 04/18/19 2105  HGB 12.2*   < > 12.7*  --  11.0*  --   --  8.3*  --   HCT 36.0*   < > 39.2  --  32.8*  --   --  25.4*  --   PLT 231   < > 225  --  215  --   --  254  --   HEPARINUNFRC 0.23*   < > 0.59   < > 0.53   < > 0.21* 0.28* 0.47  CREATININE 1.27*  --   --   --  1.02  --   --  0.91  --    < > = values in this interval not displayed.    Estimated Creatinine Clearance: 134.1 mL/min (by C-G formula based on SCr of 0.91 mg/dL).   Assessment: 39 yo M with DVT s/p thrombectomy/lysis, on heparin drip at 2200 units/hr  Repeat heparin level therapeutic at 0.47.  Goal of Therapy: Heparin level 0.3-0.7 units/ml Monitor platelets by anticoagulation protocol: Yes  Plan: Continue heparin to 2600 units/hr Daily heparin level and CBC   Arrie Senate, PharmD, BCPS Clinical Pharmacist [phone] Please check AMION for all [facility] numbers 04/18/2019

## 2019-04-18 NOTE — Evaluation (Addendum)
Occupational Therapy Evaluation Patient Details Name: Timothy [name] MRN: [id number] DOB: June 18, 1980 Today's Date: 04/18/2019    History of Present Illness Pt is a 39 y.o. male admitted 04/10/19 with LLE swelling and back pain; pt lives in [place] but drove down to [place] to visit mother ~1 wk ago. Found to have extensive LLE DVT; also with significant lymphadenopathy in the retroperitoneal area concerning for metastatic disease of unknown primary. Biopsy consistent with metastatic seminoma. S/p mechanical and pharmacological thrombolysis with stenting of the IVC for resistant malignant narrowing. Pt with ilieus 2/1. 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   Clinical Impression   This 39 y/o male presents with the above. PTA pt very independent with ADL, iADL and functional mobility, was working and living in [place] with his fiance'. Pt currently with limitations including pain, decreased endurance and overall weakness impacting his functional performance. Pt requiring two person modA for safe completion of functional transfers today; requiring up to maxA for LB ADL, setup/minguard for seated UB ADL. Pt very pleasant and willing to work with therapies, motivated to return to his PLOF and return home. He will benefit from continued acute OT services, currently feel he is appropriate for CIR level therapies at time of discharge to maximize his overall strength, safety and independence with ADL and mobility. Will follow.     Follow Up Recommendations  CIR;Supervision/Assistance - 24 hour(pending progress, may be able to return home with [facility])    Equipment Recommendations  Other (comment)(to be further assessed)           Precautions / Restrictions Precautions Precautions: Fall Restrictions Weight Bearing Restrictions: No      Mobility Bed Mobility Overal bed mobility: Needs Assistance Bed Mobility: Sit to Sidelying         Sit to sidelying: Mod  assist;+2 for physical assistance;HOB elevated General bed mobility comments: Assist to bring LEs into bed and reposition in supine with HOB elevated for ease. Increased time.  Transfers Overall transfer level: Needs assistance Equipment used: 2 person hand held assist Transfers: Sit to/from [facility] Sit to Stand: Mod assist;+2 physical assistance Stand pivot transfers: Mod assist;+2 physical assistance       General transfer comment: Assist of 2 to stand from w/c with help for forward translation, flexed trunk. Able to take a few steps to get to chair with assist for weight shifting and balance.    Balance Overall balance assessment: Needs assistance Sitting-balance support: Feet supported;Bilateral upper extremity supported Sitting balance-Leahy Scale: Fair Sitting balance - Comments: supervision for safety, use of UEs as position of comfort due to pain. Postural control: Posterior lean Standing balance support: During functional activity Standing balance-Leahy Scale: Poor Standing balance comment: Requires external support for standing balance.                           ADL either performed or assessed with clinical judgement   ADL Overall ADL's : Needs assistance/impaired Eating/Feeding: NPO   Grooming: Set up;Sitting   Upper Body Bathing: Set up;Sitting   Lower Body Bathing: Moderate assistance;Sit to/from stand;+2 for physical assistance;+2 for safety/equipment   Upper Body Dressing : Set up;Min guard;Sitting   Lower Body Dressing: Moderate assistance;Maximal assistance;+2 for physical assistance;+2 for safety/equipment;Sit to/from stand Lower Body Dressing Details (indicate cue type and reason): increased assist due to pain, weakness Toilet Transfer: Moderate assistance;+2 for physical assistance;+2 for safety/equipment;Stand-pivot Toilet Transfer Details (indicate  cue type and reason): simulated via transfer to EOB, HHA Toileting-  Clothing Manipulation and Hygiene: Maximal assistance;+2 for physical assistance;+2 for safety/equipment;Sit to/from stand       Functional mobility during ADLs: Moderate assistance;+2 for physical assistance;+2 for safety/equipment(HHA) General ADL Comments: pt with limitations largely due to pain, decreased endurance and generalized weakness     Vision         Perception     Praxis      Pertinent Vitals/Pain Pain Assessment: Faces Faces Pain Scale: Hurts whole lot Pain Location: groin, abdomen, everywhere Pain Descriptors / Indicators: Sore;Aching;Guarding;Grimacing Pain Intervention(s): Limited activity within patient's tolerance;Monitored during session;Repositioned;Patient requesting pain meds-RN notified     Hand Dominance     Extremity/Trunk Assessment Upper Extremity Assessment Upper Extremity Assessment: Overall WFL for tasks assessed   Lower Extremity Assessment Lower Extremity Assessment: Defer to PT evaluation   Cervical / Trunk Assessment Cervical / Trunk Assessment: Normal   Communication Communication Communication: No difficulties   Cognition Arousal/Alertness: Awake/alert Behavior During Therapy: WFL for tasks assessed/performed Overall Cognitive Status: Within Functional Limits for tasks assessed                                     General Comments  HR up to 125 bpm during session. Sp02 in high 90s on RA.    Exercises     Shoulder Instructions      Home Living Family/patient expects to be discharged to:: Private residence Living Arrangements: Spouse/significant other;Other (Comment)(fiance') Available Help at Discharge: Family Type of Home: House Home Access: Stairs to enter [employer] of Steps: 5 + 3 Entrance Stairs-Rails: Right Home Layout: Two level;Able to live on main level with bedroom/bathroom Alternate Level Stairs-Number of Steps: 1 flight Alternate Level Stairs-Rails: Right Bathroom Shower/Tub:  Tub/shower unit(tub on 2nd level)   Bathroom Toilet: Standard     Home Equipment: None          Prior Functioning/Environment Level of Independence: Independent        Comments: Pt from [place], works as an Forensic psychologist. visiting mother in [place]. Drives. Reports fiance can work remotely and help him at home at d/c.        OT Problem List: Decreased strength;Decreased activity tolerance;Impaired balance (sitting and/or standing);Pain;Cardiopulmonary status limiting activity      OT Treatment/Interventions: Self-care/ADL training;Therapeutic exercise;Energy conservation;DME and/or AE instruction;Therapeutic activities;Patient/family education;Balance training    OT Goals(Current goals can be found in the care plan section) Acute Rehab OT Goals Patient Stated Goal: to get back home and to PLOF OT Goal Formulation: With patient Time For Goal Achievement: 05/02/19 Potential to Achieve Goals: Good  OT Frequency: Min 2X/week   Barriers to D/C:            Co-evaluation PT/OT/SLP Co-Evaluation/Treatment: Yes Reason for Co-Treatment: Complexity of the patient's impairments (multi-system involvement);For patient/therapist safety;To address functional/ADL transfers PT goals addressed during session: Mobility/safety with mobility OT goals addressed during session: Strengthening/ROM      AM-PAC OT "6 Clicks" Daily Activity     Outcome Measure Help from another person eating meals?: None(though currently NPO) Help from another person taking care of personal grooming?: A Little Help from another person toileting, which includes using toliet, bedpan, or urinal?: A Lot Help from another person bathing (including washing, rinsing, drying)?: A Lot Help from another person to put on and taking off regular upper body clothing?: None Help from another  person to put on and taking off regular lower body clothing?: A Lot 6 Click Score: 17   End of Session Nurse Communication: Mobility  status  Activity Tolerance: Patient tolerated treatment well;Patient limited by pain Patient left: in bed;with call bell/phone within reach;with nursing/sitter in room  OT Visit Diagnosis: Other abnormalities of gait and mobility (R26.89);Pain Pain - part of body: (generalized)                Time: [id number] OT Time Calculation (min): 23 min Charges:  OT General Charges $OT Visit: 1 Visit OT Evaluation $OT Eval Moderate Complexity: [place], OT [employer] Pager [phone] Office [phone]   Raymondo Band 04/18/2019, 4:47 PM

## 2019-04-18 NOTE — Progress Notes (Signed)
ANTICOAGULATION CONSULT NOTE Pharmacy Consult for Heparin Indication: DVT  No Known Allergies  Patient Measurements: Height: 6\' 3"  (190.5 cm) Weight: 216 lb 14.9 oz (98.4 kg) IBW/kg (Calculated) : 84.5  Vital Signs: Temp: 100.3 F (37.9 C) (02/02 1205) Temp Source: Oral (02/02 1205) BP: 129/76 (02/02 1200) Pulse Rate: 125 (02/02 1200)  Labs: Recent Labs    04/15/19 2317 04/16/19 0450 04/16/19 1202 04/16/19 1939 04/17/19 0018 04/18/19 0221 04/18/19 1051  HGB 12.2*   < > 12.7*  --  11.0*  --  8.3*  HCT 36.0*   < > 39.2  --  32.8*  --  25.4*  PLT 231   < > 225  --  215  --  254  HEPARINUNFRC 0.23*   < > 0.59   < > 0.53 0.21* 0.28*  CREATININE 1.27*  --   --   --  1.02  --  0.91   < > = values in this interval not displayed.    Estimated Creatinine Clearance: 134.1 mL/min (by C-G formula based on SCr of 0.91 mg/dL).   Assessment: 39 yo M with DVT s/p thrombectomy/lysis, on heparin drip at 2200 units/hr  2/2  update:  Heparin level low at 0.28  Goal of Therapy: Heparin level 0.3-0.7 units/ml Monitor platelets by anticoagulation protocol: Yes  Plan: Inc heparin to 2600 units/hr Re-check heparin level in 6 hours  Nevaan Bunton A. Levada Dy, PharmD, BCPS, FNKF Clinical Pharmacist [facility] Please utilize Amion for appropriate phone number to reach the unit pharmacist ([place])

## 2019-04-18 NOTE — Progress Notes (Signed)
ANTICOAGULATION CONSULT NOTE Pharmacy Consult for Heparin Indication: DVT  No Known Allergies  Patient Measurements: Height: 6\' 3"  (190.5 cm) Weight: 200 lb (90.7 kg) IBW/kg (Calculated) : 84.5  Vital Signs: Temp: 100.6 F (38.1 C) (02/02 0000) Temp Source: Oral (02/02 0000) BP: 116/74 (02/02 0300) Pulse Rate: 124 (02/02 0300)  Labs: Recent Labs    04/15/19 0751 04/15/19 1805 04/15/19 2317 04/15/19 2317 04/16/19 0450 04/16/19 0450 04/16/19 1202 04/16/19 1202 04/16/19 1939 04/17/19 0018 04/18/19 0221  HGB 13.5   < > 12.2*   < > 12.8*   < > 12.7*  --   --  11.0*  --   HCT 41.1   < > 36.0*   < > 38.6*  --  39.2  --   --  32.8*  --   PLT 265   < > 231   < > 220  --  225  --   --  215  --   HEPARINUNFRC 0.48   < > 0.23*   < > 0.37   < > 0.59   < > 0.53 0.53 0.21*  CREATININE 1.00  --  1.27*  --   --   --   --   --   --  1.02  --    < > = values in this interval not displayed.    Estimated Creatinine Clearance: 119.7 mL/min (by C-G formula based on SCr of 1.02 mg/dL).   Assessment: 39 yo M with DVT s/p thrombectomy/lysis, on heparin drip at 2200 units/hr  2/2 AM update:  Heparin level low  Goal of Therapy: Heparin level 0.3-0.7 units/ml Monitor platelets by anticoagulation protocol: Yes  Plan: Inc heparin to 2400 units/hr Re-check heparin level at [place], PharmD, [place] Pharmacist Phone: [phone]

## 2019-04-19 ENCOUNTER — Inpatient Hospital Stay (HOSPITAL_COMMUNITY): Payer: BLUE CROSS/BLUE SHIELD

## 2019-04-19 DIAGNOSIS — A689 Relapsing fever, unspecified: Secondary | ICD-10-CM

## 2019-04-19 DIAGNOSIS — I82402 Acute embolism and thrombosis of unspecified deep veins of left lower extremity: Secondary | ICD-10-CM

## 2019-04-19 DIAGNOSIS — R509 Fever, unspecified: Secondary | ICD-10-CM

## 2019-04-19 LAB — CBC
HCT: 26 % — ABNORMAL LOW (ref 39.0–52.0)
Hemoglobin: 8.5 g/dL — ABNORMAL LOW (ref 13.0–17.0)
MCH: 30 pg (ref 26.0–34.0)
MCHC: 32.7 g/dL (ref 30.0–36.0)
MCV: 91.9 fL (ref 80.0–100.0)
Platelets: 337 10*3/uL (ref 150–400)
RBC: 2.83 MIL/uL — ABNORMAL LOW (ref 4.22–5.81)
RDW: 13.5 % (ref 11.5–15.5)
WBC: 13.2 10*3/uL — ABNORMAL HIGH (ref 4.0–10.5)
nRBC: 0 % (ref 0.0–0.2)

## 2019-04-19 LAB — HEPARIN LEVEL (UNFRACTIONATED): Heparin Unfractionated: 0.52 IU/mL (ref 0.30–0.70)

## 2019-04-19 LAB — COMPREHENSIVE METABOLIC PANEL
ALT: 24 U/L (ref 0–44)
AST: 30 U/L (ref 15–41)
Albumin: 2.3 g/dL — ABNORMAL LOW (ref 3.5–5.0)
Alkaline Phosphatase: 84 U/L (ref 38–126)
Anion gap: 10 (ref 5–15)
BUN: 6 mg/dL (ref 6–20)
CO2: 22 mmol/L (ref 22–32)
Calcium: 7.6 mg/dL — ABNORMAL LOW (ref 8.9–10.3)
Chloride: 102 mmol/L (ref 98–111)
Creatinine, Ser: 0.95 mg/dL (ref 0.61–1.24)
GFR calc Af Amer: >60 mL/min (ref >60)
GFR calc non Af Amer: >60 mL/min (ref >60)
Glucose, Bld: 107 mg/dL — ABNORMAL HIGH (ref 70–99)
Potassium: 3.9 mmol/L (ref 3.5–5.1)
Sodium: 134 mmol/L — ABNORMAL LOW (ref 135–145)
Total Bilirubin: 1 mg/dL (ref 0.3–1.2)
Total Protein: 6.1 g/dL — ABNORMAL LOW (ref 6.5–8.1)

## 2019-04-19 LAB — ECHOCARDIOGRAM COMPLETE
Height: 75 in
Weight: 3470.92 oz

## 2019-04-19 LAB — PROCALCITONIN: Procalcitonin: 0.51 ng/mL

## 2019-04-19 MED ORDER — METRONIDAZOLE IN NACL 5-0.79 MG/ML-% IV SOLN
500.0000 mg | Freq: Three times a day (TID) | INTRAVENOUS | Status: DC
  Administered 2019-04-19 – 2019-04-26 (×21): 500 mg via INTRAVENOUS
  Filled 2019-04-19 (×21): qty 100

## 2019-04-19 MED ORDER — SODIUM CHLORIDE 0.9 % IV BOLUS
1000.0000 mL | Freq: Once | INTRAVENOUS | Status: AC
  Administered 2019-04-19: 05:00:00 1000 mL via INTRAVENOUS

## 2019-04-19 MED ORDER — SODIUM CHLORIDE 0.9 % IV BOLUS
1000.0000 mL | Freq: Once | INTRAVENOUS | Status: AC
  Administered 2019-04-19: 1000 mL via INTRAVENOUS

## 2019-04-19 MED ORDER — IOHEXOL 350 MG/ML SOLN
80.0000 mL | Freq: Once | INTRAVENOUS | Status: AC | PRN
  Administered 2019-04-19: 23:00:00 80 mL via INTRAVENOUS

## 2019-04-19 NOTE — Consult Note (Signed)
NAME:  Timothy [name], MRN:  [id number], DOB:  [birth year], LOS: 8 ADMISSION DATE:  04/10/2019, CONSULTATION DATE:  04/19/19 REFERRING MD:  Rodena Piety  CHIEF COMPLAINT:  FUO   Brief History   Timothy [name] is a 39 y.o. 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  He was visiting his mother in [place] when he developed LLE edema.  Found to have extensive DVT and CT incidentally showed extensive RP lymphadenopathy.  Had biopsy by IR with results consistent with seminoma / recurrence.  Underwent mechanical and pharmacological thrombectomy 1/30 and 1/31 and also had IVC stent placed.  2/1, had fever which persisted into 2/3.  CT abd / pelvis obtained 2/2 and showed New ill defined fluid collection within the left psoas muscle, question abscess vs hematoma.  PCCM asked to weigh in 2/3 given ongoing fevers of unknown origin.  History of present illness   Timothy [name] is a 39 y.o. male who has a PMH including but not limited to stage Ib seminoma testicular CA diagnosed in 2012 at [facility], s/p right orchiectomy.  Since then, he has been under surveillance.  Pt lives in [place] but was visiting his mother in [place].  He works as an Forensic psychologist.  He presented to [facility] ED 1/26 with LLE edema.  He was found to have extensive LLE DVT.  CT of the chest / abdomen / pelvis also showed extensive retroperitoneal and pelvic adenopathy with direct compression of the lower / distal IVC and iliac veins.  Findings were concerning for metastatic disease given his hx.  US of the scrotum did not show any abnormality besides moderate left hydrocele and varicocele.  He was admitted and started on heparin gtt for DVT.  On 1/27, he had CT guided RP LN biopsy.  Results consistent with seminoma.  Oncology discussed with pt and recommended resuming chemo once he returned home to [place].  On 1/30 and 1/31 he had bilateral pelvic and IVC mechanical and  pharmacological thrombectomy for occlusive DVT with overlapping stenting of the IVC for resistant malignant narrowing.  2/1, he developed fever and tachycardia.  Cultures were drawn and he was started on vanc and cefepime empirically. CT abd / pelvis obtained 2/2 and showed New ill defined fluid collection within the left psoas muscle, question abscess vs hematoma.  2/3, fever persisted with TMax of 102.  PCCM asked to evaluate / weigh in.  Past Medical History  has DVT (deep venous thrombosis) ([place]); H/O testicular cancer; Lymphadenopathy, retroperitoneal; Seminoma ([place]); Acute deep vein thrombosis (DVT) of left lower extremity ([place]); Elevated serum creatinine; and Abdominal distension on their problem list.  Significant Hospital Events   1/26 > admit. 1/27 > CT guided retroperitoneal lymph node biopsy. 1/30 and 1/31 > bilateral pelvic and IVC mechanical and pharmacological thrombectomy for occlusive DVT with overlapping stenting of the IVC for resistant malignant narrowing.  Consults:  Oncology, IR.  Procedures:  N/A.  Significant Diagnostic Tests:  CTA chest / CT abd and pelvis 1/26 > no PE.  Extensive RP and pelvic adenopathy with direct compression of the lower and distal IVC and iliac veins.  There is complete encasement of the abdominal aorta without luminal narrowing.  Findings are highly concerning for metastatic disease.  Left scrotal varicocele.  Free fluid in deep pelvis.  Colonic diverticulosis. Scrotum US 1/26 > mod left hydrocele and varicocele. MRV abd 1/28 > ilio caval thrombus involving the lower IVC, bilateral  common iliac vein, left external iliac vein, and proximal left femoral system.  Pathological lymphadenopathy of the retroperitoneum. MRI brain 1/30 > neg. Renal US 2/1 > normal. CT A / P 2/2 > persistent retroperitoneal adenopathy.  New ill defined fluid collection within the left psoas muscle, question abscess vs hematoma.  Micro Data:  COVID 1/26 > neg. Urine  2/1 > neg. Blood 2/1 >   Antimicrobials:  Vanc 2/2 >  Cefepime 2/2 >  Flagyl 2/3 >    Interim history/subjective:  Currently on room air no acute distress  Objective:  Blood pressure 136/75, pulse (!) 130, temperature (!) 102.8 F (39.3 C), temperature source Oral, resp. rate (!) 25, height 6\' 3"  (1.905 m), weight 98.4 kg, SpO2 99 %.        Intake/Output Summary (Last 24 hours) at 04/19/2019 1942 Last data filed at 04/19/2019 1815 Gross per 24 hour  Intake 1704.79 ml  Output 2450 ml  Net -745.21 ml   Filed Weights   04/10/19 2005 04/18/19 0600  Weight: 90.7 kg 98.4 kg    Examination: General: Well-nourished well-developed male no acute distress on room air Neuro: Grossly intact no focal defect HEENT: No JVD or lymphadenopathy is appreciated Cardiovascular: Heart sounds are regular regular rhythm Lungs: Diminished in the bases Abdomen: Distended with faint bowel sounds Musculoskeletal: Left leg with increased girth popliteal wound is noted Skin: Warm and dry  Assessment & Plan:   Fever of unknown origin in the setting of left DVT with lysis coupled with ileus hypoventilation and recurrence of metastatic testicular cancer seminoma.  With CT-guided retroperitoneal lymph node biopsy 04/12/2019 consistent with metastatic seminoma. Pulmonary critical care asked to evaluate for fever of unknown origin. Agree with antimicrobial therapy Needs to mobilize No PE on CT scan does show a lymph node enlargement Currently on anticoagulation Pulmonary toilet  Recurrent metastatic seminoma Per oncology Note has been followed at [facility] in the past.  Once stabilized and follow-up with [facility].   Extensive left DVT status post intervention Heparin drip Transition to oral agent Per primary  Ileus Minimize narcotics Bowel regimen Per primary    Best Practice:  Diet: NPO Pain/Anxiety/Delirium protocol (if indicated): Parenteral narcotics VAP protocol (if indicated):  Not indicated DVT prophylaxis: SCD's / Heparin. GI prophylaxis: None indicated Glucose control: Nondiabetic Mobility: Out of bed as tolerated Code Status: Full Family Communication: Patient updated at bedside Disposition: Remains on floor  Labs   CBC: Recent Labs  Lab 04/15/19 0751 04/15/19 1805 04/16/19 0450 04/16/19 1202 04/17/19 0018 04/18/19 1051 04/19/19 0151  WBC 6.7   < > 11.0* 9.1 11.4* 14.1* 13.2*  NEUTROABS 4.8  --   --   --  9.0*  --   --   HGB 13.5   < > 12.8* 12.7* 11.0* 8.3* 8.5*  HCT 41.1   < > 38.6* 39.2 32.8* 25.4* 26.0*  MCV 91.3   < > 90.8 92.0 88.9 92.0 91.9  PLT 265   < > 220 225 215 254 337   < > = values in this interval not displayed.   Basic Metabolic Panel: Recent Labs  Lab 04/15/19 0751 04/15/19 2317 04/17/19 0018 04/18/19 1051 04/19/19 0151  NA 134* 133* 130* 132* 134*  K 4.1 4.0 3.9 4.2 3.9  CL 96* 98 100 99 102  CO2 28 24 24 23 22   GLUCOSE 95 102* 110* 105* 107*  BUN 11 16 8 6 6   CREATININE 1.00 1.27* 1.02 0.91 0.95  CALCIUM 8.9  8.6* 7.8* 7.7* 7.6*   GFR: Estimated Creatinine Clearance: 128.5 mL/min (by C-G formula based on SCr of 0.95 mg/dL). Recent Labs  Lab 04/16/19 1202 04/17/19 0018 04/18/19 1051 04/18/19 1700 04/18/19 1822 04/18/19 2105 04/19/19 0151  PROCALCITON  --   --   --  0.42 0.46  --  0.51  WBC 9.1 11.4* 14.1*  --   --   --  13.2*  LATICACIDVEN  --   --   --   --  1.3 1.2  --    Liver Function Tests: Recent Labs  Lab 04/18/19 1051 04/19/19 0151  AST 30 30  ALT 19 24  ALKPHOS 89 84  BILITOT 1.0 1.0  PROT 5.5* 6.1*  ALBUMIN 2.3* 2.3*   No results for input(s): LIPASE, AMYLASE in the last 168 hours. No results for input(s): AMMONIA in the last 168 hours. ABG No results found for: PHART, PCO2ART, PO2ART, HCO3, TCO2, ACIDBASEDEF, O2SAT  Coagulation Profile: No results for input(s): INR, PROTIME in the last 168 hours. Cardiac Enzymes: No results for input(s): CKTOTAL, CKMB, CKMBINDEX, TROPONINI in the  last 168 hours. HbA1C: No results found for: HGBA1C CBG: Recent Labs  Lab 04/16/19 0729 04/16/19 1124  GLUCAP 115* 86    Review of Systems:   10 point review of system taken, please see HPI for positives and negatives.   Past medical history  He,  has a past medical history of Testicular cancer ([place]).   Surgical History    Past Surgical History:  Procedure Laterality Date  . IR INFUSION THROMBOL VENOUS INITIAL (MS)  04/15/2019  . IR THROMB F/U EVAL ART/VEN FINAL DAY (MS)  04/16/2019  . IR THROMBECT VENO MECH MOD SED  04/15/2019  . IR THROMBECT VENO MECH REPT MOD SED  04/16/2019  . IR TRANSCATH PLC STENT 1ST ART NOT LE CV CAR VERT CAR  04/16/2019  . IR US GUIDE VASC ACCESS LEFT  04/15/2019  . IR US GUIDE VASC ACCESS RIGHT  04/15/2019  . IR VENO/EXT/BI  04/15/2019  . IR VENOCAVAGRAM IVC  04/15/2019  . ORCHIECTOMY Right   . TUMOR REMOVAL       Social History   reports that he has never smoked. He has never used smokeless tobacco. He reports previous alcohol use.   Family history   His family history includes Atrial fibrillation in his mother.   Allergies No Known Allergies   Home meds  Prior to Admission medications   Medication Sig Start Date End Date Taking? Authorizing Provider  cyclobenzaprine (FLEXERIL) 10 MG tablet Take 1 tablet (10 mg total) by mouth 3 (three) times daily as needed for muscle spasms. 04/04/19  Yes Lamptey, Myrene Galas, MD  ibuprofen (ADVIL) 600 MG tablet Take 1 tablet (600 mg total) by mouth every 6 (six) hours as needed. 04/04/19  Yes Lamptey, Myrene Galas, MD        Gaylyn Lambert ACNP Acute Care Nurse Practitioner [place] Please consult Amion 04/20/2019, 9:51 AM

## 2019-04-19 NOTE — Progress Notes (Signed)
Occupational Therapy Treatment Patient Details Name: Timothy [name] MRN: [id number] DOB: 08/19/1980 Today's Date: 04/19/2019    History of present illness Pt is a 39 y.o. male admitted 04/10/19 with LLE swelling and back pain; pt lives in [place] but drove down to [place] to visit mother ~1 wk ago. Found to have extensive LLE DVT; also with significant lymphadenopathy in the retroperitoneal area concerning for metastatic disease of unknown primary. Biopsy consistent with metastatic seminoma. S/p mechanical and pharmacological thrombolysis with stenting of the IVC for resistant malignant narrowing. Pt with ilieus 2/1. 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   OT comments  Pt progressing well toward stated goals, focused session on OOB BADL and standing balance. Pt completed bed mobility at min A for LLE support. He then completed sit <> stands at mod-min A +2 with RW. He is overall limited by pain and generalized weakness for BADL. He was able to complete functional mobility to bathroom and stand at sink to complete grooming task with one rest break. Toilet transfer then completed at min A +2 (improves with firm surface with handles). Cues for sequencing given throughout to manage more painful LLE. Continue to recommend CIR for intensive engagement in BADL training prior to returning home with fiance. Will continue to follow.   Follow Up Recommendations  CIR;Supervision/Assistance - 24 hour    Equipment Recommendations  3 in 1 bedside commode    Recommendations for Other Services      Precautions / Restrictions Precautions Precautions: Fall Precaution Comments: weeping blisters on posterior knee Restrictions Weight Bearing Restrictions: No       Mobility Bed Mobility Overal bed mobility: Needs Assistance Bed Mobility: Supine to Sit     Supine to sit: HOB elevated;Min assist     General bed mobility comments: Pt still need min assist moving  L>R LE to EOB  Transfers Overall transfer level: Needs assistance Equipment used: Rolling walker (2 wheeled) Transfers: Sit to/from Stand Sit to Stand: Mod assist;+2 physical assistance;Min assist Stand pivot transfers: +2 safety/equipment;Mod assist;Min assist       General transfer comment: light mod A +2 for initial stand, progressing to min A +2.    Balance Overall balance assessment: Needs assistance Sitting-balance support: No upper extremity supported;Single extremity supported Sitting balance-Leahy Scale: Good Sitting balance - Comments: supervision for safety, use of UEs as position of comfort due to pain.   Standing balance support: During functional activity Standing balance-Leahy Scale: Fair Standing balance comment: prefers UE assist, but able to maintain stance without external support                           ADL either performed or assessed with clinical judgement   ADL Overall ADL's : Needs assistance/impaired Eating/Feeding: NPO   Grooming: Minimal assistance;Standing Grooming Details (indicate cue type and reason): at sink in bathroom to complete oral care                 Toilet Transfer: Minimal assistance;+2 for physical assistance;+2 for safety/equipment;BSC Toilet Transfer Details (indicate cue type and reason): to [facility] in [place], needing to sit on [facility] for rest break rather than going to toilet         Functional mobility during ADLs: Minimal assistance;+2 for physical assistance;+2 for safety/equipment;Rolling walker General ADL Comments: pt able to progress BADL mobility this date to bathroom level BADL tasks     Vision Patient Visual Report:  No change from baseline     Perception     Praxis      Cognition Arousal/Alertness: Awake/alert Behavior During Therapy: WFL for tasks assessed/performed Overall Cognitive Status: Within Functional Limits for tasks assessed                                           Exercises Exercises: Other exercises Other Exercises Other Exercises: warm up hip/knee ROM exercise with Active/AA flexion and graded resistance to gross extension   Shoulder Instructions       General Comments      Pertinent Vitals/ Pain       Pain Assessment: Faces Faces Pain Scale: Hurts even more Pain Location: groin, abdomen Pain Descriptors / Indicators: Sore;Aching;Guarding;Grimacing Pain Intervention(s): Monitored during session  Home Living                                          Prior Functioning/Environment              Frequency  Min 2X/week        Progress Toward Goals  OT Goals(current goals can now be found in the care plan section)  Progress towards OT goals: Progressing toward goals  Acute Rehab OT Goals Patient Stated Goal: to get back home and to PLOF OT Goal Formulation: With patient Time For Goal Achievement: 05/02/19 Potential to Achieve Goals: Good  Plan Discharge plan remains appropriate    Co-evaluation    PT/OT/SLP Co-Evaluation/Treatment: Yes Reason for Co-Treatment: For patient/therapist safety;To address functional/ADL transfers PT goals addressed during session: Mobility/safety with mobility OT goals addressed during session: ADL's and self-care;Strengthening/ROM      AM-PAC OT "6 Clicks" Daily Activity     Outcome Measure   Help from another person eating meals?: Total(NPO) Help from another person taking care of personal grooming?: A Little Help from another person toileting, which includes using toliet, bedpan, or urinal?: A Lot Help from another person bathing (including washing, rinsing, drying)?: A Lot Help from another person to put on and taking off regular upper body clothing?: None Help from another person to put on and taking off regular lower body clothing?: A Lot 6 Click Score: 14    End of Session Equipment Utilized During Treatment: Rolling walker;Gait belt  OT Visit Diagnosis: Other  abnormalities of gait and mobility (R26.89);Pain Pain - part of body: (abdomen, back)   Activity Tolerance Patient tolerated treatment well   Patient Left in chair;with call bell/phone within reach;with chair alarm set   Nurse Communication Mobility status        Time: [id number] OT Time Calculation (min): 34 min  Charges: OT General Charges $OT Visit: 1 Visit OT Treatments $Self Care/Home Management : 8-22 mins  Zenovia Jarred, MSOT, OTR/L Acute Rehabilitation Services [facility] Office Number: [phone]  Zenovia Jarred 04/19/2019, 5:19 PM

## 2019-04-19 NOTE — Progress Notes (Signed)
Physical Therapy Treatment Patient Details Name: Timothy [name] MRN: [id number] DOB: [birth year] Today's Date: 04/19/2019    History of Present Illness Pt is a 39 y.o. male admitted 04/10/19 with LLE swelling and back pain; pt lives in [place] but drove down to [place] to visit mother ~1 wk ago. Found to have extensive LLE DVT; also with significant lymphadenopathy in the retroperitoneal area concerning for metastatic disease of unknown primary. Biopsy consistent with metastatic seminoma. S/p mechanical and pharmacological thrombolysis with stenting of the IVC for resistant malignant narrowing. Pt with ilieus 2/1. 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    PT Comments    Pt was eager to participate today.  Some of his reluctance to move alleviated by completing some LE warm up prior to mobility.  Emphasis on reinforcing better sequencing to manage w/bearing on the L LE, transitions to EOB, sit to stand, progressing gait with the RW and standing task at the sink for 8 to 10 min without rest in between the walk and standing task.     Follow Up Recommendations  Supervision for mobility/OOB;CIR     Equipment Recommendations  Rolling walker with 5" wheels    Recommendations for Other Services       Precautions / Restrictions Precautions Precautions: Fall    Mobility  Bed Mobility Overal bed mobility: Needs Assistance Bed Mobility: Supine to Sit     Supine to sit: HOB elevated;Min guard     General bed mobility comments: Pt still need min assist moving L>R LE to EOB  Transfers Overall transfer level: Needs assistance Equipment used: Rolling walker (2 wheeled) Transfers: Sit to/from Stand Sit to Stand: Mod assist;+2 physical assistance Stand pivot transfers: +2 safety/equipment;Mod assist       General transfer comment: cues for best technique and light mod assist to assist pt forward with some boost.  Ambulation/Gait Ambulation/Gait  assistance: Min assist;+2 safety/equipment Gait Distance (Feet): 20 Feet(then 15 feet with RW) Assistive device: Rolling walker (2 wheeled) Gait Pattern/deviations: Step-to pattern;Step-through pattern Gait velocity: slower Gait velocity interpretation: <1.31 ft/sec, indicative of household ambulator General Gait Details: cues for best sequencing.  Generally antalgic, but notable improvement after warm up then spending time at a standing ADL task at the sink   Stairs             Wheelchair Mobility    Modified Rankin (Stroke Patients Only)       Balance Overall balance assessment: Needs assistance Sitting-balance support: No upper extremity supported;Single extremity supported Sitting balance-Leahy Scale: Good     Standing balance support: During functional activity Standing balance-Leahy Scale: Fair Standing balance comment: prefers UE assist, but able to maintain stance without external support                            Cognition Arousal/Alertness: Awake/alert Behavior During Therapy: WFL for tasks assessed/performed Overall Cognitive Status: Within Functional Limits for tasks assessed                                        Exercises Other Exercises Other Exercises: warm up hip/knee ROM exercise with Active/AA flexion and graded resistance to gross extension    General Comments        Pertinent Vitals/Pain Pain Assessment: Faces Faces Pain Scale: Hurts even more Pain Location: groin, abdomen  Pain Descriptors / Indicators: Sore;Aching;Guarding;Grimacing Pain Intervention(s): Monitored during session    Home Living                      Prior Function            PT Goals (current goals can now be found in the care plan section) Acute Rehab PT Goals Patient Stated Goal: to get back home and to PLOF PT Goal Formulation: With patient Time For Goal Achievement: 05/02/19 Potential to Achieve Goals: Good Progress  towards PT goals: Progressing toward goals    Frequency    Min 3X/week      PT Plan Current plan remains appropriate    Co-evaluation PT/OT/SLP Co-Evaluation/Treatment: Yes Reason for Co-Treatment: To address functional/ADL transfers PT goals addressed during session: Mobility/safety with mobility OT goals addressed during session: ADL's and self-care      AM-PAC PT "6 Clicks" Mobility   Outcome Measure  Help needed turning from your back to your side while in a flat bed without using bedrails?: A Little Help needed moving from lying on your back to sitting on the side of a flat bed without using bedrails?: A Lot Help needed moving to and from a bed to a chair (including a wheelchair)?: A Lot Help needed standing up from a chair using your arms (e.g., wheelchair or bedside chair)?: A Lot Help needed to walk in hospital room?: A Little Help needed climbing 3-5 steps with a railing? : A Lot 6 Click Score: 14    End of Session   Activity Tolerance: Patient tolerated treatment well Patient left: in chair;with call bell/phone within reach;with family/visitor present Nurse Communication: Mobility status PT Visit Diagnosis: Other abnormalities of gait and mobility (R26.89);Pain;Difficulty in walking, not elsewhere classified (R26.2) Pain - Right/Left: (groin)     Time: [id number] PT Time Calculation (min) (ACUTE ONLY): 34 min  Charges:  $Gait Training: 8-22 mins                     04/19/2019  Ginger Carne., PT Acute Rehabilitation Services [phone]  (pager) [phone]  (office)   Tessie Fass Chynah Orihuela 04/19/2019, 5:09 PM

## 2019-04-19 NOTE — Progress Notes (Signed)
  Echocardiogram 2D Echocardiogram has been performed.  Timothy [name] 04/19/2019, 5:23 PM

## 2019-04-19 NOTE — Progress Notes (Signed)
Cyclical fevers.  Will fully evaluate in AM.  Nothing sticking out right now.  Reach out to CCM on call if any acute issues overnight.

## 2019-04-19 NOTE — Progress Notes (Signed)
   04/19/19 1817  Vitals  Temp (!) 102.8 F (39.3 C)  Temp Source Oral  BP 136/75  MAP (mmHg) 94  BP Location Left Arm  BP Method Automatic  Patient Position (if appropriate) Lying  Pulse Rate (!) 130  Pulse Rate Source Monitor  Resp (!) 25  Oxygen Therapy  SpO2 99 %  O2 Device Room Air  MEWS Score  MEWS Temp 2  MEWS Systolic 0  MEWS Pulse 3  MEWS RR 1  MEWS LOC 0  MEWS Score 6  MEWS Score Color Red  Matthews, MD made aware; Tylenol given RE (See MAR)

## 2019-04-19 NOTE — Progress Notes (Signed)
Rehab Admissions Coordinator Note:  Patient was screened by Cleatrice Burke for appropriateness for an Inpatient Acute Rehab Consult per PT and OT recs.   At this time, we are recommending Inpatient Rehab consult. I will place order per protocol.  Cleatrice Burke RN MSN 04/19/2019, 5:42 PM  I can be reached at [phone].

## 2019-04-19 NOTE — Progress Notes (Signed)
ANTICOAGULATION CONSULT NOTE Pharmacy Consult for Heparin Indication: DVT  No Known Allergies  Patient Measurements: Height: 6\' 3"  (190.5 cm) Weight: 216 lb 14.9 oz (98.4 kg) IBW/kg (Calculated) : 84.5  Vital Signs: Temp: 99.1 F (37.3 C) (02/03 0720) Temp Source: Oral (02/03 0720) BP: 122/74 (02/03 0720) Pulse Rate: 118 (02/03 0720)  Labs: Recent Labs    04/17/19 0018 04/18/19 0221 04/18/19 1051 04/18/19 2105 04/19/19 0151  HGB 11.0*  --  8.3*  --  8.5*  HCT 32.8*  --  25.4*  --  26.0*  PLT 215  --  254  --  337  HEPARINUNFRC 0.53   < > 0.28* 0.47 0.52  CREATININE 1.02  --  0.91  --  0.95   < > = values in this interval not displayed.    Estimated Creatinine Clearance: 128.5 mL/min (by C-G formula based on SCr of 0.95 mg/dL).   Assessment: 39 yo M with DVT s/p thrombectomy/lysis, on heparin drip at 2600 units/hr  2/3 AM  update:  Heparin level therapeutic at 0.52, no bleeding noted.   Goal of Therapy: Heparin level 0.3-0.7 units/ml Monitor platelets by anticoagulation protocol: Yes  Plan: Continue heparin at 2600 units/hr Daily Heparin level/CBC  Rasheen Schewe A. Levada Dy, PharmD, BCPS, FNKF Clinical Pharmacist [place] Please utilize Amion for appropriate phone number to reach the unit pharmacist ([place])

## 2019-04-19 NOTE — Progress Notes (Signed)
PROGRESS NOTE    Timothy [name]  [id number] DOB: [birth year] DOA: 04/10/2019 PCP: Patient, No Pcp Per   Brief Narrative: 39 year old male with history of testicular carcinoma stage Ib followed at [facility] had right orchiectomy lives in [place] travel to [place] now admitted with left lower extremity extensive DVT with intra-abdominal and retroperitoneal lymphadenopathy concerning for metastatic cancer.  04/19/2019-patient was seen multiple times today due to yellow to red mucus.  Fianc by the bedside.  Overall he is feeling slightly better than yesterday.  He continues to be n.p.o. with ileus.  Denies any worsening of pain.  He was seen by PT yesterday.  He denies any chest pain shortness of breath cough diarrhea.  He has pain in his left lower extremity but it is not worse.  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 Continue heparin for now.  Will switch to Lovenox 1 mg/kg every 12 closer to when he is ready for discharge. Lovenox to be continued for 30 days once discharged then transition to oral anticoagulation 90 days of aspirin 81 mg daily Bilateral lower extremity compression stockings at least knee-high Out of bed ambulate  Will DC Foley once he is able to ambulate  #2 fever with tachycardia-source of infection not yet known.  He is on Vanco and cefepime.  His T-max is 103.   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  Renal  ultrasound showed no evidence of hydronephrosis. CT abdomen and pelvis -shows ileus and psoas fluid collection likely hematoma.  Discussed with IR yesterday.  They think this fluid collection is more likely to be hematoma than an abscess and not recommending I&D at this time.  #4 metastatic testicular cancer-he will follow-up at [facility] for further treatments once he is discharged.  Appreciate oncology and IR input reviewed.  #5 ileus with abdominal distention and abdominal tenderness.  Keep electrolytes normal.  Out of bed ambulate.  Minimize narcotics.  KUB in a.m.  KUB shows ileus today.  Per Dr. Stephenie Acres. Lavon Paganini, Assistant Professor of Radiology, has kindly agreed to see Mr Bodkins in continuity of his current vascular care, when Mr Dutkiewicz returns home.  Scheduling: [place]: [phone]  Dr. Ottis Stain has also kindly arranged follow-up with the Medical Oncology Department of [facility], and Mr Dworaczyk should have a pending office appointment upon arrival.     Estimated body mass index is 27.11 kg/m as calculated from the following:   Height as of this encounter: 6\' 3"  (1.905 m).   Weight as of this encounter: 98.4 kg.  DVT prophylaxis: IV heparin Code Status: Full code Family Communication: Discussed with patient and fianc at bedside. disposition Plan: Patient came from home plan is to return to home.  Barriers to discharge-patient spiking fever and tachycardic with unknown source of infection and currently have postop ileus, hematuria  Consultants:   Interventional radiology  Procedures: CT-guided biopsy of the lymph node and mechanical and thrombolytic intervention to left lower extremity for acute  DVT Antimicrobials: Vanco and cefepime 04/18/2019 Subjective: Patient is resting in bed awake alert denies nausea vomiting fever chills or cough. Objective: Vitals:   04/19/19 1022 04/19/19 1034 04/19/19 1107 04/19/19 1159  BP: 120/72  130/76 140/80 127/73  Pulse: (!) 118 (!) 118 (!) 125 (!) 124  Resp: (!) 22 (!) 22 (!) 24 (!) 24  Temp: 99.9 F (37.7 C) (!) 100.5 F (38.1 C) 98.5 F (36.9 C) 100.3 F (37.9 C)  TempSrc: Oral Oral Oral Oral  SpO2: 98% 99% 98% 97%  Weight:      Height:        Intake/Output Summary (Last 24 hours) at 04/19/2019 1232 Last data filed at 04/19/2019 1030 Gross per 24 hour  Intake 5145.99 ml  Output 2250 ml  Net 2895.99 ml   Filed Weights   04/10/19 2005 04/18/19 0600  Weight: 90.7 kg 98.4 kg    Examination:  General exam: Appears calm and comfortable  Respiratory system: Clear to auscultation. Respiratory effort normal. Cardiovascular system: S1 & S2 heard, RRR. No JVD, murmurs, rubs, gallops or clicks. No pedal edema. Gastrointestinal system: Abdomen is nondistended, soft and nontender. No organomegaly or masses felt. Normal bowel sounds heard. Central nervous system: Alert and oriented. No focal neurological deficits. Extremities: Symmetric 5 x 5 power. Skin: No rashes, lesions or ulcers Psychiatry: Judgement and insight appear normal. Mood & affect appropriate.     Data Reviewed: I have personally reviewed following labs and imaging studies  CBC: Recent Labs  Lab 04/15/19 0751 04/15/19 1805 04/16/19 0450 04/16/19 1202 04/17/19 0018 04/18/19 1051 04/19/19 0151  WBC 6.7   < > 11.0* 9.1 11.4* 14.1* 13.2*  NEUTROABS 4.8  --   --   --  9.0*  --   --   HGB 13.5   < > 12.8* 12.7* 11.0* 8.3* 8.5*  HCT 41.1   < > 38.6* 39.2 32.8* 25.4* 26.0*  MCV 91.3   < > 90.8 92.0 88.9 92.0 91.9  PLT 265   < > 220 225 215 254 337   < > = values in this interval not displayed.   Basic Metabolic Panel: Recent Labs  Lab 04/15/19 0751 04/15/19 2317 04/17/19 0018 04/18/19 1051 04/19/19 0151  NA 134* 133* 130* 132* 134*  K 4.1 4.0 3.9 4.2 3.9  CL 96* 98 100 99 102  CO2 28 24 24 23 22   GLUCOSE 95 102* 110* 105* 107*  BUN 11 16 8 6 6   CREATININE 1.00 1.27* 1.02 0.91 0.95  CALCIUM  8.9 8.6* 7.8* 7.7* 7.6*   GFR: Estimated Creatinine Clearance: 128.5 mL/min (by C-G formula based on SCr of 0.95 mg/dL). Liver Function Tests: Recent Labs  Lab 04/18/19 1051 04/19/19 0151  AST 30 30  ALT 19 24  ALKPHOS 89 84  BILITOT 1.0 1.0  PROT 5.5* 6.1*  ALBUMIN 2.3* 2.3*   No results for input(s): LIPASE, AMYLASE in the last 168 hours. No results for input(s): AMMONIA in the last 168 hours. Coagulation Profile: No results for input(s): INR, PROTIME in the last 168 hours. Cardiac Enzymes: No results for input(s): CKTOTAL, CKMB, CKMBINDEX, TROPONINI in the last 168 hours. BNP (last 3 results) No results for input(s): PROBNP in the last 8760 hours. HbA1C: No results for input(s): HGBA1C in the last 72 hours. CBG: Recent Labs  Lab 04/16/19 0729 04/16/19 1124  GLUCAP 115* 86   Lipid Profile: No results for input(s): CHOL, HDL, LDLCALC, TRIG, CHOLHDL, LDLDIRECT in the last 72 hours. Thyroid  Function Tests: No results for input(s): TSH, T4TOTAL, FREET4, T3FREE, THYROIDAB in the last 72 hours. Anemia Panel: No results for input(s): VITAMINB12, FOLATE, FERRITIN, TIBC, IRON, RETICCTPCT in the last 72 hours. Sepsis Labs: Recent Labs  Lab 04/18/19 1700 04/18/19 1822 04/18/19 2105 04/19/19 0151  PROCALCITON 0.42 0.46  --  0.51  LATICACIDVEN  --  1.3 1.2  --     Recent Results (from the past 240 hour(s))  SARS CORONAVIRUS 2 (TAT 6-24 HRS) Nasopharyngeal Nasopharyngeal Swab     Status: None   Collection Time: 04/11/19 12:32 AM   Specimen: Nasopharyngeal Swab  Result Value Ref Range Status   SARS Coronavirus 2 NEGATIVE NEGATIVE Final    Comment: (NOTE) SARS-CoV-2 target nucleic acids are NOT DETECTED. The SARS-CoV-2 RNA is generally detectable in upper and lower respiratory specimens during the acute phase of infection. Negative results do not preclude SARS-CoV-2 infection, do not rule out co-infections with other pathogens, and should not be used as the sole basis  for treatment or other patient management decisions. Negative results must be combined with clinical observations, patient history, and epidemiological information. The expected result is Negative. Fact Sheet for Patients: SugarRoll.be Fact Sheet for Healthcare Providers: https://www.[employer].com/ This test is not yet approved or cleared by the [place] FDA and  has been authorized for detection and/or diagnosis of SARS-CoV-2 by FDA under an Emergency Use Authorization (EUA). This EUA will remain  in effect (meaning this test can be used) for the duration of the COVID-19 declaration under Section 56 4(b)(1) of the Act, 21 U.S.C. section 360bbb-3(b)(1), unless the authorization is terminated or revoked sooner. Performed at [facility], [place] [address], [place], [place] [address]   Culture, blood (routine x 2)     Status: None (Preliminary result)   Collection Time: 04/17/19 10:15 AM   Specimen: BLOOD RIGHT HAND  Result Value Ref Range Status   Specimen Description BLOOD RIGHT HAND  Final   Special Requests   Final    BOTTLES DRAWN AEROBIC ONLY Blood Culture results may not be optimal due to an inadequate volume of blood received in culture bottles   Culture   Final    NO GROWTH 2 DAYS Performed at [facility], [place] [address], [place], [place] [address]    Report Status PENDING  Incomplete  Culture, blood (routine x 2)     Status: None (Preliminary result)   Collection Time: 04/17/19 10:20 AM   Specimen: BLOOD  Result Value Ref Range Status   Specimen Description BLOOD LEFT ANTECUBITAL  Final   Special Requests   Final    BOTTLES DRAWN AEROBIC AND ANAEROBIC Blood Culture adequate volume   Culture   Final    NO GROWTH 2 DAYS Performed at [facility], [place] [address] [place], [place] [address]    Report Status PENDING  Incomplete  Culture, Urine     Status: None   Collection Time: 04/17/19 10:43 AM    Specimen: Urine, Random  Result Value Ref Range Status   Specimen Description URINE, RANDOM  Final   Special Requests NONE  Final   Culture   Final    NO GROWTH Performed at [facility], [place] [address], [place], [place] [address]    Report Status 04/18/2019 FINAL  Final         Radiology Studies: DG Abd 1 View  Result Date: 04/19/2019 CLINICAL DATA:  Generalized abdominal pain, postoperative ileus. EXAM: ABDOMEN - 1  VIEW COMPARISON:  April 18, 2019. FINDINGS: Stable dilated air-filled colon is noted most consistent with postoperative ileus. No significant small bowel dilatation is noted. Phleboliths are noted in the pelvis. IVC stent is again noted. IMPRESSION: Stable dilated air-filled colon is noted most consistent with postoperative ileus. Electronically Signed   By: Marijo Conception M.D.   On: 04/19/2019 09:14   CT ABDOMEN PELVIS W CONTRAST  Result Date: 04/18/2019 CLINICAL DATA:  Testicular carcinoma post RIGHT orchiectomy, LEFT lower extremity DVT, abdominal distension, recent CT with intra-abdominal and retroperitoneal lymphadenopathy concerning for metastatic cancer EXAM: CT ABDOMEN AND PELVIS WITH CONTRAST TECHNIQUE: Multidetector CT imaging of the abdomen and pelvis was performed using the standard protocol following bolus administration of intravenous contrast. Sagittal and coronal MPR images reconstructed from axial data set. CONTRAST:  118mL OMNIPAQUE IOHEXOL 300 MG/ML SOLN IV. No oral contrast. COMPARISON:  04/11/2019 FINDINGS: Lower chest: Dependent bibasilar atelectasis in the lower lobes Hepatobiliary: New dependent density within gallbladder question vicarious excretion of contrast material from prior CT exam versus sludge. No gallbladder wall thickening. Liver unremarkable. Pancreas: Normal appearance Spleen: Normal appearance Adrenals/Urinary Tract: Adrenal glands, kidneys, and ureters normal appearance. Foley catheter and air within urinary bladder. Stomach/Bowel:  Retrocecal appendix, normal. Gaseous distention of colon from cecum to descending colon. Small amount of stool in fluid within proximal colon. Tapering of the distal colon with few scattered distal colonic diverticula but no evidence of diverticulitis. Rectosigmoid colon decompressed. No discrete point of obstruction seen; findings favor ileus. Stomach and small bowel loops unremarkable Vascular/Lymphatic: Prior stenting of the IVC. Aorta normal caliber without aneurysm. IVC, iliac veins, common femoral veins patent without thrombus. Previously identified thrombus LEFT external iliac vein no longer seen. Again identified significant retroperitoneal adenopathy unchanged. Reproductive: Seminal vesicles unremarkable. Minimal prostatic prominence for age. Other: Scattered stranding of retroperitoneal tissue planes increased from previous exam. No free air or free fluid. Tiny LEFT inguinal hernia containing fat Musculoskeletal: Osseous structures unremarkable. New enlargement of the LEFT psoas muscle with in ill-defined area of central low attenuation approximately 3.7 x 3.9 cm in axial dimensions extending 12 cm length, could represent LEFT psoas abscess or hematoma. This is new since 04/11/2019 making tumor infiltration unlikely. IMPRESSION: Mild gaseous distention of the colon through descending colon with decompressed rectosigmoid colon; no point of obstruction is identified, favor ileus. Persistent retroperitoneal adenopathy suspicious for metastatic disease. New ill-defined fluid collection within LEFT psoas muscle 3.7 x 3.9 x 12 cm question psoas abscess versus hematoma; LEFT psoas muscle was traversed by prior CT guided retroperitoneal lymph node biopsy. Increased retroperitoneal and mesenteric stranding. Bibasilar atelectasis. Findings called to Dr. Zigmund Daniel on 04/18/2019 at 1108 hours. Electronically Signed   By: Lavonia Dana M.D.   On: 04/18/2019 11:08   DG Chest Port 1 View  Result Date: 04/18/2019 CLINICAL  DATA:  Fever, testicular cancer EXAM: PORTABLE CHEST 1 VIEW COMPARISON:  None. FINDINGS: Normal heart size. Normal mediastinal contour. No pneumothorax. No pleural effusion. Slightly low lung volumes. Minimal streaky opacities at both lung bases. No pulmonary edema. IMPRESSION: Slightly low lung volumes. Minimal streaky opacities at the lung bases, favoring minimal atelectasis. No consolidative airspace disease to suggest a pneumonia. Electronically Signed   By: Ilona Sorrel M.D.   On: 04/18/2019 15:41   DG Abd Portable 1V  Result Date: 04/18/2019 CLINICAL DATA:  Abdominal pain and distension EXAM: PORTABLE ABDOMEN - 1 VIEW COMPARISON:  04/11/2019 FINDINGS: Supine frontal view of the abdomen and pelvis excludes the hemidiaphragms, right  flank, and lower pelvis by collimation. There is diffuse gaseous distention of the colon, likely reflecting postoperative ileus. Stable position of the IVC stent. No masses or abnormal calcifications. IMPRESSION: 1. Distended gas-filled colon consistent with postprocedural ileus. 2. IVC stent as above Electronically Signed   By: Randa Ngo M.D.   On: 04/18/2019 08:41        Scheduled Meds: . Chlorhexidine Gluconate Cloth  6 each Topical Daily  . pantoprazole (PROTONIX) IV  40 mg Intravenous Q12H  . polyethylene glycol  17 g Oral Daily  . sodium chloride flush  3 mL Intravenous Q12H   Continuous Infusions: . sodium chloride 50 mL/hr at 04/16/19 1200  . sodium chloride 100 mL/hr at 04/18/19 2231  . sodium chloride 100 mL/hr at 04/19/19 0554  . ceFEPime (MAXIPIME) IV 2 g (04/19/19 0555)  . heparin 2,600 Units/hr (04/19/19 0935)  . vancomycin 1,250 mg (04/19/19 0943)     LOS: 8 days     Georgette Shell, MD [facility]  If 7PM-7AM, please contact night-coverage [email or web address] Password [email or web address] 04/19/2019, 12:32 PM

## 2019-04-19 NOTE — Progress Notes (Signed)
Referring Physician(s): Dr. Burr Medico  Supervising Physician: [name]  Patient Status:  [facility] - In-pt  Chief Complaint: Seminoma, retroperitoneal lymphadenopathy IVC occlusion, bilateral pelvic DVT, LLE DVT s/p mechanical thrombectomy and catheter-directed thrombolysis with stent placement in the mid/distal IVC  Subjective: Assessed today at bedside alongside Dr. Earleen Newport.  He is stable and has transitioned out of ICU to a floor bed.   Patient complains of weakness in his left leg which has not improved since procedure despite improvement in swelling.  He was able to work with PT today for the first time and was noted to be "dragging" his left leg. He describes this as general weakness as opposed to being related to pain.  He does endorse mild thigh pain when attempting to lift his left leg, but none at rest.   Complains of dark urine, concern for hematuria.   Motivated to work with PT.   Fiancee present during visit today.  All questions answered.   Allergies: Patient has no known allergies.  Medications: Prior to Admission medications   Medication Sig Start Date End Date Taking? Authorizing Provider  cyclobenzaprine (FLEXERIL) 10 MG tablet Take 1 tablet (10 mg total) by mouth 3 (three) times daily as needed for muscle spasms. 04/04/19  Yes Lamptey, Myrene Galas, MD  ibuprofen (ADVIL) 600 MG tablet Take 1 tablet (600 mg total) by mouth every 6 (six) hours as needed. 04/04/19  Yes Lamptey, Myrene Galas, MD     Vital Signs: BP 132/83 (BP Location: Left Arm)   Pulse (!) 127   Temp 99.6 F (37.6 C) (Oral)   Resp (!) 27   Ht 6\' 3"  (1.905 m)   Wt 216 lb 14.9 oz (98.4 kg)   SpO2 95%   BMI 27.11 kg/m   Physical Exam  NAD, alert, resting comfortably in bed.  MSK: mild LLE swelling significantly improved.  Weakness in LLE with inability to lift LLE.  Decreased ability to flex the thigh with mild soreness. Sensation intact.  Dorsiflexion and extension of the feet intact bilaterally.  No deficits noted on the right.  GU: Dark-colored urine  Imaging: DG Abd 1 View  Result Date: 04/19/2019 CLINICAL DATA:  Generalized abdominal pain, postoperative ileus. EXAM: ABDOMEN - 1 VIEW COMPARISON:  April 18, 2019. FINDINGS: Stable dilated air-filled colon is noted most consistent with postoperative ileus. No significant small bowel dilatation is noted. Phleboliths are noted in the pelvis. IVC stent is again noted. IMPRESSION: Stable dilated air-filled colon is noted most consistent with postoperative ileus. Electronically Signed   By: Marijo Conception M.D.   On: 04/19/2019 09:14   CT ABDOMEN PELVIS W CONTRAST  Result Date: 04/18/2019 CLINICAL DATA:  Testicular carcinoma post RIGHT orchiectomy, LEFT lower extremity DVT, abdominal distension, recent CT with intra-abdominal and retroperitoneal lymphadenopathy concerning for metastatic cancer EXAM: CT ABDOMEN AND PELVIS WITH CONTRAST TECHNIQUE: Multidetector CT imaging of the abdomen and pelvis was performed using the standard protocol following bolus administration of intravenous contrast. Sagittal and coronal MPR images reconstructed from axial data set. CONTRAST:  11mL OMNIPAQUE IOHEXOL 300 MG/ML SOLN IV. No oral contrast. COMPARISON:  04/11/2019 FINDINGS: Lower chest: Dependent bibasilar atelectasis in the lower lobes Hepatobiliary: New dependent density within gallbladder question vicarious excretion of contrast material from prior CT exam versus sludge. No gallbladder wall thickening. Liver unremarkable. Pancreas: Normal appearance Spleen: Normal appearance Adrenals/Urinary Tract: Adrenal glands, kidneys, and ureters normal appearance. Foley catheter and air within urinary bladder. Stomach/Bowel: Retrocecal appendix, normal. Gaseous distention of  colon from cecum to descending colon. Small amount of stool in fluid within proximal colon. Tapering of the distal colon with few scattered distal colonic diverticula but no evidence of diverticulitis.  Rectosigmoid colon decompressed. No discrete point of obstruction seen; findings favor ileus. Stomach and small bowel loops unremarkable Vascular/Lymphatic: Prior stenting of the IVC. Aorta normal caliber without aneurysm. IVC, iliac veins, common femoral veins patent without thrombus. Previously identified thrombus LEFT external iliac vein no longer seen. Again identified significant retroperitoneal adenopathy unchanged. Reproductive: Seminal vesicles unremarkable. Minimal prostatic prominence for age. Other: Scattered stranding of retroperitoneal tissue planes increased from previous exam. No free air or free fluid. Tiny LEFT inguinal hernia containing fat Musculoskeletal: Osseous structures unremarkable. New enlargement of the LEFT psoas muscle with in ill-defined area of central low attenuation approximately 3.7 x 3.9 cm in axial dimensions extending 12 cm length, could represent LEFT psoas abscess or hematoma. This is new since 04/11/2019 making tumor infiltration unlikely. IMPRESSION: Mild gaseous distention of the colon through descending colon with decompressed rectosigmoid colon; no point of obstruction is identified, favor ileus. Persistent retroperitoneal adenopathy suspicious for metastatic disease. New ill-defined fluid collection within LEFT psoas muscle 3.7 x 3.9 x 12 cm question psoas abscess versus hematoma; LEFT psoas muscle was traversed by prior CT guided retroperitoneal lymph node biopsy. Increased retroperitoneal and mesenteric stranding. Bibasilar atelectasis. Findings called to Dr. Zigmund Daniel on 04/18/2019 at 1108 hours. Electronically Signed   By: Lavonia Dana M.D.   On: 04/18/2019 11:08   US RENAL  Result Date: 04/17/2019 CLINICAL DATA:  Urinary retention.  History of testicular carcinoma. EXAM: RENAL / URINARY TRACT ULTRASOUND COMPLETE COMPARISON:  CT scan 04/11/2019 FINDINGS: Right Kidney: Renal measurements: 13.5 x 5.5 x 6.6 cm = volume: 260.0 mL . Normal renal cortical thickness and  echogenicity without focal lesions or hydronephrosis. Left Kidney: Renal measurements: 12.0 x 6.8 x 5.4 cm = volume: 229.4 mL. Normal renal cortical thickness and echogenicity without focal lesions or hydronephrosis. Bladder: Decompressed by Foley catheter. Other: None. IMPRESSION: Normal sonographic appearance of both kidneys.  No hydronephrosis. Bladder decompressed by a Foley catheter. Electronically Signed   By: Marijo Sanes M.D.   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  Patient was initiated bilateral pelvic/IVC catheter directed thrombolysis day prior (04/15/2019), and returns today following 24 hours of tPA infusion.  EXAM: 1. IR THROMB F/U EVAL ART/VEN FINAL DAY 2. FLUOROSCOPIC GUIDED ANGIOPLASTY AND STENT PLACEMENT OF THE IVC  COMPARISON:  Initiation of bilateral catheter directed pelvic venous and IVC thrombolysis - 04/15/2019; CT abdomen pelvis - 04/11/2019  MEDICATIONS: Heparin 7000 units  CONTRAST:  85 cc Omnipaque 300  ANESTHESIA/SEDATION: Moderate (conscious) sedation was employed during this procedure. A total of Versed 2 mg and Dilaudid 3 mg was administered intravenously.  Moderate Sedation Time: 128 minutes. The patient's level of consciousness and vital signs were monitored continuously by radiology nursing throughout the procedure under my direct supervision.  FLUOROSCOPY TIME:  14 minutes, 12 seconds (Q000111Q mGy)  COMPLICATIONS: None immediate.  TECHNIQUE: Informed written consent was obtained from the patient after a discussion of the risks, benefits and alternatives to treatment. Questions regarding the procedure were encouraged and answered. A timeout was performed prior to the initiation of the procedure.  The patient was  placed  prone on the fluoroscopy table and the skin posterior to the bilateral knees as well as the external portion the existing bilateral vascular sheaths and infusion catheters was prepped and draped in usual sterile fashion. The surrounding subcutaneous tissues about both popliteal approach vascular sheath was anesthetized with 1% lidocaine with epinephrine.  Next, post lysis venograms were performed from the bilateral infusion catheters following removal of the bilateral infusion wires.  Images were reviewed and the decision was made to proceed with additional mechanical thrombectomy.  With the use of a vertebral catheter exchange length stiff Glidewires were advanced from both existing popliteal vascular sheaths superior to the nearly obstructing caval lesion. Contrast injection confirmed appropriate positioning. Stiff glide wires were advanced into the left subclavian vein.  4000 units of heparin was administered intravenously.  Next, beginning with the left lower extremity, the existing 8 French vascular sheath was exchanged over the glidewire for a 13 [place] vascular sheath.  The 16 mm diameter Clottreiever Inari mechanical thrombectomy device was then utilized to perform 3 passes from the left popliteal approach from at and just below level of the obstructing caval lesion. Post thrombectomy images were obtained from the left lower extremity.  Next, the existing right lower extremity 6 French popliteal vein vascular sheath was exchanged over a guidewire for a 13 French vascular sheath. Again, from the right lower extremity approach, the 16 mm diameter Clottreiever Inari mechanical thrombectomy device was then utilized to perform 3 passes from the level at and just below the obstructing caval lesion. Post thrombectomy images were obtained from the right lower extremity.  An ACT level was obtained and an additional 3000 units of heparin was administered intravenously.  The long segment eccentric  irregular narrowing/subtotal occlusion of the IVC was subsequently balloon angioplastied at multiple stations with a 14 mm x 4 cm Atlas balloon. Despite adequate balloon apposition, post angioplasty venogram images demonstrate a recurrent hemodynamically significant narrowing.  As such, a 20 mm x 60 mm Venovo venous stent was deployed across the superior and mid aspect of the caval lesion and was subsequently balloon angioplastied at multiple stations initially with a 16 mm x 4 cm atlas balloon and ultimately with an 18 mm x 4 cm balloon.  Post stent deployment venogram images were obtained however there is felt to be incomplete coverage of the distal aspect of the malignant narrowing. As such, an additional 20 mm x 40 mm Venovo venous stent was deployed overlapping the mid/caudal aspect of venous stent and adequately covering the caudal aspect caval lesion.  Both overlapping venous stents were subsequently balloon angioplastied at multiple stations with the 18 mm atlas balloon and completion venogram images were obtained from both lower extremities. Images were reviewed and the procedure was terminated.  All wires, catheters and sheaths were removed from the bilateral popliteal vein accesses and hemostasis was achieved with manual compression. Dressings applied. The patient tolerated the procedure well without immediate postprocedural complication.  FINDINGS: Initial post lysis venogram performed from the left lower extremity infusion catheter demonstrates restoration of flow through the left pelvis with moderate amount residual thrombus primarily within the peripheral aspect of the left external iliac vein as well as the left common and central aspect of the left superficial femoral veins. There is passage of contrast beyond the nearly obstructing lesion involving mid/caudal aspect of the IVC.  Initial post lysis venogram performed from the right lower extremity demonstrates persistent occlusion at the level  of the right common iliac vein.  Following 3  passes from each lower extremity with the 16 mm diameter Clottreiever Inari mechanical thrombectomy device, nearly all residual clot burden within the pelvis was removed.  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 This lesion was again noted to be significantly caudal to the confluence of the bilateral renal veins.  As such, nearly obstructing caval lesion was treated with overlapping stent deployment of 20 mm diameter Venovo venous stents which were subsequently balloon angioplastied to 18 mm diameter.  Completion images demonstrate a technically excellent result with restored brisk flow through the bilateral pelvic systems and the IVC.  There is a mild residual narrowing at the level of the mid/caudal aspect of the IVC, though not felt to result in a residual hemodynamically significant stenosis.  No evidence of complication. Specifically, no evidence of contrast extravasation or vessel dissection.  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  PLAN: - Maintain IV heparin drip until patient is successfully converted to long-term anticoagulant.  - Recommend maintaining SCD devices while in bed.   Electronically Signed   By: Sandi Mariscal M.D.   On: 04/16/2019 16:26   DG Chest Port 1 View  Result Date: 04/18/2019 CLINICAL DATA:  Fever, testicular cancer EXAM: PORTABLE CHEST 1 VIEW COMPARISON:  None. FINDINGS: Normal heart size. Normal mediastinal contour. No pneumothorax. No pleural effusion. Slightly low lung volumes. Minimal streaky opacities at both lung  bases. No pulmonary edema. IMPRESSION: Slightly low lung volumes. Minimal streaky opacities at the lung bases, favoring minimal atelectasis. No consolidative airspace disease to suggest a pneumonia. Electronically Signed   By: Ilona Sorrel M.D.   On: 04/18/2019 15:41   DG Abd Portable 1V  Result Date: 04/18/2019 CLINICAL DATA:  Abdominal pain and distension EXAM: PORTABLE ABDOMEN - 1 VIEW COMPARISON:  04/11/2019 FINDINGS: Supine frontal view of the abdomen and pelvis excludes the hemidiaphragms, right flank, and lower pelvis by collimation. There is diffuse gaseous distention of the colon, likely reflecting postoperative ileus. Stable position of the IVC stent. No masses or abnormal calcifications. IMPRESSION: 1. Distended gas-filled colon consistent with postprocedural ileus. 2. IVC stent as above Electronically Signed   By: Randa Ngo M.D.   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 Patient was initiated bilateral pelvic/IVC catheter directed thrombolysis day prior (04/15/2019), and returns today following 24 hours of tPA infusion. EXAM: 1. IR THROMB F/U EVAL ART/VEN FINAL DAY 2. FLUOROSCOPIC GUIDED ANGIOPLASTY AND STENT PLACEMENT OF THE IVC COMPARISON:  Initiation of bilateral catheter directed pelvic venous and IVC thrombolysis - 04/15/2019; CT abdomen pelvis - 04/11/2019 MEDICATIONS: Heparin 7000 units CONTRAST:  85 cc Omnipaque 300 ANESTHESIA/SEDATION: Moderate (conscious) sedation was employed during this procedure. A total of Versed 2 mg and Dilaudid 3 mg was administered intravenously. Moderate Sedation Time: 128 minutes. The patient's level of consciousness and vital signs were monitored continuously by radiology nursing throughout  the procedure under my direct supervision. FLUOROSCOPY TIME:  14 minutes, 12 seconds (Q000111Q mGy) COMPLICATIONS: None immediate. TECHNIQUE: Informed written consent was obtained from the  patient after a discussion of the risks, benefits and alternatives to treatment. Questions regarding the procedure were encouraged and answered. A timeout was performed prior to the initiation of the procedure. The patient was placed prone on the fluoroscopy table and the skin posterior to the bilateral knees as well as the external portion the existing bilateral vascular sheaths and infusion catheters was prepped and draped in usual sterile fashion. The surrounding subcutaneous tissues about both popliteal approach vascular sheath was anesthetized with 1% lidocaine with epinephrine. Next, post lysis venograms were performed from the bilateral infusion catheters following removal of the bilateral infusion wires. Images were reviewed and the decision was made to proceed with additional mechanical thrombectomy. With the use of a vertebral catheter exchange length stiff Glidewires were advanced from both existing popliteal vascular sheaths superior to the nearly obstructing caval lesion. Contrast injection confirmed appropriate positioning. Stiff glide wires were advanced into the left subclavian vein. 4000 units of heparin was administered intravenously. Next, beginning with the left lower extremity, the existing 8 French vascular sheath was exchanged over the glidewire for a 13 [place] vascular sheath. The 16 mm diameter Clottreiever Inari mechanical thrombectomy device was then utilized to perform 3 passes from the left popliteal approach from at and just below level of the obstructing caval lesion. Post thrombectomy images were obtained from the left lower extremity. Next, the existing right lower extremity 6 French popliteal vein vascular sheath was exchanged over a guidewire for a 13 French vascular sheath. Again, from the right  lower extremity approach, the 16 mm diameter Clottreiever Inari mechanical thrombectomy device was then utilized to perform 3 passes from the level at and just below the obstructing caval lesion. Post thrombectomy images were obtained from the right lower extremity. An ACT level was obtained and an additional 3000 units of heparin was administered intravenously. The long segment eccentric irregular narrowing/subtotal occlusion of the IVC was subsequently balloon angioplastied at multiple stations with a 14 mm x 4 cm Atlas balloon. Despite adequate balloon apposition, post angioplasty venogram images demonstrate a recurrent hemodynamically significant narrowing. As such, a 20 mm x 60 mm Venovo venous stent was deployed across the superior and mid aspect of the caval lesion and was subsequently balloon angioplastied at multiple stations initially with a 16 mm x 4 cm atlas balloon and ultimately with an 18 mm x 4 cm balloon. Post stent deployment venogram images were obtained however there is felt to be incomplete coverage of the distal aspect of the malignant narrowing. As such, an additional 20 mm x 40 mm Venovo venous stent was deployed overlapping the mid/caudal aspect of venous stent and adequately covering the caudal aspect caval lesion. Both overlapping venous stents were subsequently balloon angioplastied at multiple stations with the 18 mm atlas balloon and completion venogram images were obtained from both lower extremities. Images were reviewed and the procedure was terminated. All wires, catheters and sheaths were removed from the bilateral popliteal vein accesses and hemostasis was achieved with manual compression. Dressings applied. The patient tolerated the procedure well without immediate postprocedural complication. FINDINGS: Initial post lysis venogram performed from the left lower extremity infusion catheter demonstrates restoration of flow through the left pelvis with moderate amount residual  thrombus primarily within the peripheral aspect of the left external iliac vein as well as the left common and central aspect of the left superficial femoral veins. There is passage of contrast beyond the nearly obstructing lesion involving mid/caudal aspect of the IVC. Initial post lysis venogram performed  from the right lower extremity demonstrates persistent occlusion at the level of the right common iliac vein. Following 3 passes from each lower extremity with the 16 mm diameter Clottreiever Inari mechanical thrombectomy device, nearly all residual clot burden within the pelvis was removed. 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 This lesion was again noted to be significantly caudal to the confluence of the bilateral renal veins. As such, nearly obstructing caval lesion was treated with overlapping stent deployment of 20 mm diameter Venovo venous stents which were subsequently balloon angioplastied to 18 mm diameter. Completion images demonstrate a technically excellent result with restored brisk flow through the bilateral pelvic systems and the IVC. There is a mild residual narrowing at the level of the mid/caudal aspect of the IVC, though not felt to result in a residual hemodynamically significant stenosis. No evidence of complication. Specifically, no evidence of contrast extravasation or vessel dissection. 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 PLAN: - Maintain IV heparin drip until patient is successfully converted to long-term anticoagulant. - Recommend maintaining SCD devices while in bed.  Electronically Signed   By: Sandi Mariscal M.D.   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 Patient was initiated bilateral pelvic/IVC catheter directed thrombolysis day prior (04/15/2019), and returns today following 24 hours of tPA infusion. EXAM: 1. IR THROMB F/U EVAL ART/VEN FINAL DAY 2. FLUOROSCOPIC GUIDED ANGIOPLASTY AND STENT PLACEMENT OF THE IVC COMPARISON:  Initiation of bilateral catheter directed pelvic venous and IVC thrombolysis - 04/15/2019; CT abdomen pelvis - 04/11/2019 MEDICATIONS: Heparin 7000 units CONTRAST:  85 cc Omnipaque 300 ANESTHESIA/SEDATION: Moderate (conscious) sedation was employed during this procedure. A total of Versed 2 mg and Dilaudid 3 mg was administered intravenously. Moderate Sedation Time: 128 minutes. The patient's level of consciousness and vital signs were monitored continuously by radiology nursing throughout the procedure under my direct supervision. FLUOROSCOPY TIME:  14 minutes, 12 seconds (Q000111Q mGy) COMPLICATIONS: None immediate. TECHNIQUE: Informed written consent was obtained from the patient after a discussion of the risks, benefits and alternatives to treatment. Questions regarding the procedure were encouraged and answered. A timeout was performed prior to the initiation of the procedure. The patient was placed prone on the fluoroscopy table and the skin posterior to the bilateral knees as well as the external portion the existing bilateral vascular sheaths and infusion catheters was prepped and draped in usual sterile fashion. The surrounding subcutaneous tissues about both popliteal approach vascular sheath was anesthetized with 1% lidocaine with epinephrine. Next, post lysis venograms were performed from the  bilateral infusion catheters following removal of the bilateral infusion wires. Images were reviewed and the decision was made to proceed with additional mechanical thrombectomy. With the use of a vertebral catheter exchange length stiff Glidewires were advanced from both existing popliteal vascular sheaths superior to the nearly obstructing caval lesion. Contrast injection confirmed appropriate positioning. Stiff glide wires were advanced into the left subclavian vein. 4000 units of heparin was administered intravenously. Next, beginning with the left lower extremity, the existing 8 [place] vascular  sheath was exchanged over the glidewire for a 13 French vascular sheath. The 16 mm diameter Clottreiever Inari mechanical thrombectomy device was then utilized to perform 3 passes from the left popliteal approach from at and just below level of the obstructing caval lesion. Post thrombectomy images were obtained from the left lower extremity. Next, the existing right lower extremity 6 French popliteal vein vascular sheath was exchanged over a guidewire for a 13 French vascular sheath. Again, from the right lower extremity approach, the 16 mm diameter Clottreiever Inari mechanical thrombectomy device was then utilized to perform 3 passes from the level at and just below the obstructing caval lesion. Post thrombectomy images were obtained from the right lower extremity. An ACT level was obtained and an additional 3000 units of heparin was administered intravenously. The long segment eccentric irregular narrowing/subtotal occlusion of the IVC was subsequently balloon angioplastied at multiple stations with a 14 mm x 4 cm Atlas balloon. Despite adequate balloon apposition, post angioplasty venogram images demonstrate a recurrent hemodynamically significant narrowing. As such, a 20 mm x 60 mm Venovo venous stent was deployed across the superior and mid aspect of the caval lesion and was subsequently balloon angioplastied at  multiple stations initially with a 16 mm x 4 cm atlas balloon and ultimately with an 18 mm x 4 cm balloon. Post stent deployment venogram images were obtained however there is felt to be incomplete coverage of the distal aspect of the malignant narrowing. As such, an additional 20 mm x 40 mm Venovo venous stent was deployed overlapping the mid/caudal aspect of venous stent and adequately covering the caudal aspect caval lesion. Both overlapping venous stents were subsequently balloon angioplastied at multiple stations with the 18 mm atlas balloon and completion venogram images were obtained from both lower extremities. Images were reviewed and the procedure was terminated. All wires, catheters and sheaths were removed from the bilateral popliteal vein accesses and hemostasis was achieved with manual compression. Dressings applied. The patient tolerated the procedure well without immediate postprocedural complication. FINDINGS: Initial post lysis venogram performed from the left lower extremity infusion catheter demonstrates restoration of flow through the left pelvis with moderate amount residual thrombus primarily within the peripheral aspect of the left external iliac vein as well as the left common and central aspect of the left superficial femoral veins. There is passage of contrast beyond the nearly obstructing lesion involving mid/caudal aspect of the IVC. Initial post lysis venogram performed from the right lower extremity demonstrates persistent occlusion at the level of the right common iliac vein. Following 3 passes from each lower extremity with the 16 mm diameter Clottreiever Inari mechanical thrombectomy device, nearly all residual clot burden within the pelvis was removed. 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 This lesion was again noted to be significantly caudal to the confluence of the bilateral renal veins. As such, nearly obstructing caval lesion was treated with overlapping stent deployment of 20 mm diameter Venovo venous stents which were subsequently balloon angioplastied to 18 mm diameter. Completion images demonstrate a technically excellent result with restored brisk flow through the bilateral pelvic systems and the IVC. There is a mild residual narrowing at the level of the mid/caudal aspect of the IVC, though not felt to result in a residual hemodynamically significant stenosis. No evidence of complication. Specifically, no evidence of contrast  extravasation or vessel dissection. 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 PLAN: - Maintain IV heparin drip until patient is successfully converted to long-term anticoagulant. - Recommend maintaining SCD devices while in bed. Electronically Signed   By: Sandi Mariscal M.D.   On: 04/16/2019 16:26    Labs:  CBC: Recent Labs    04/16/19 1202 04/17/19 0018 04/18/19 1051 04/19/19 0151  WBC 9.1 11.4* 14.1* 13.2*  HGB 12.7* 11.0* 8.3* 8.5*  HCT 39.2 32.8* 25.4* 26.0*  PLT 225 215 254 337    COAGS: Recent Labs    04/11/19 1258  INR 1.0    BMP: Recent Labs    04/15/19 2317 04/17/19 0018 04/18/19 1051 04/19/19 0151  NA 133* 130* 132* 134*  K 4.0 3.9 4.2 3.9  CL 98 100 99 102  CO2 24 24 23 22   GLUCOSE 102* 110* 105* 107*  BUN 16 8 6 6   CALCIUM 8.6* 7.8* 7.7* 7.6*  CREATININE 1.27* 1.02 0.91 0.95  GFRNONAA >60 >60 >60 >60  GFRAA >60 >60 >60 >60    LIVER FUNCTION TESTS: Recent Labs    04/11/19 0432 04/18/19 1051 04/19/19 0151  BILITOT 0.8 1.0 1.0  AST 17 30 30   ALT 16 19 24   ALKPHOS 59 89 84  PROT 7.8 5.5* 6.1*    ALBUMIN 3.9 2.3* 2.3*    Assessment and Plan: Seminoma, 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  WBC slightly elevated to 13.2 Febrile with Tmax 103.1 CT Abdomen Pelvis shows ill-defined fluid collection within the L psoas muscle which corresponds to the site traversed during his recent lymph node biopsy.  Dr. Earleen Newport has reviewed imaging and notes that collection may represent a hematoma especially given the patient underwent thrombolysis procedure with tPA administration shortly after biopsy was performed. The presence of the hematoma also corresponds with his symptoms of leg weakness and referred thigh pain which should improve over time.  No evidence of nerve injury at this time.   Ongoing work-up underway for the source of the patient's fever.  Thus far, work-up has been negative and may be reactive to recent extensive procedures.   He remains on empiric IV abx.   Encouraged patient to continue to work with PT.  Consider re-imaging only if fails to improve as hematoma is expected to be self-limiting and should resolve with time.   Hematuria Patient with dark-colored urine.  On 2600u heparin drip.  Question whether discoloration is myoglobin from blood product breakdown after lysis procedure.  Per Dr. Earleen Newport, no evidence of primary renal cause for hematuria.  Patient NPO, on IVF, due to ongoing ileus.  Reports he had a very small BM this AM and has passed a minimal amount of gas.  Consider increasing IVFs if able.   IVC occlusion, bilateral pelvic DVT, LLE DVT s/p mechanical thrombectomy and catheter-directed thrombolysis with stent placement in the mid/distal IVC Puncture site intact.  Swelling in his LLE has significantly improved.  Celesta Gentile bought compression socks which can be used daily. He was able to get OOB and work with PT yesterday and today. He does have an ileus which has slowed his recovery.   Currently NPO.  Plan as previously outlined: VIR has discussed Mr Mcevoy's current case and care with the VIR department at [facility].   Dr. Lavon Paganini, Assistant Professor of Radiology, has kindly agreed  to see Mr Dai in continuity of his current vascular care, when Mr Interrante returns home.  Scheduling: [place]: [phone]  Dr. Ottis Stain has also kindly arranged follow-up with the Medical Oncology Department of [facility], and Mr Child should have a pending office appointment upon arrival.   Our VIR recommendations on DC from [place]: - Continue anti-coagulation with 30 days of weight-based lovenox - after 30 days of weight-based lovenox, transition to oral anticoagulation of managing physician's choice - 90 days of 81mg  ASA daily - Bilateral lower extremity compression stockings, at least knee-high - please have the patient bring a copy of current medical records with him to facilitate care on DC   Electronically Signed: Docia Barrier, PA 04/19/2019, 5:05 PM   I spent a total of 25 Minutes at the the patient's bedside AND on the patient's hospital floor or unit, greater than 50% of which was counseling/coordinating care for IVC occlusion, bilateral pelvic DVT, LLE DVT s/p mechanical thrombectomy and catheter-directed thrombolysis with stent placement in the mid/distal IVC

## 2019-04-20 ENCOUNTER — Inpatient Hospital Stay (HOSPITAL_COMMUNITY): Payer: BLUE CROSS/BLUE SHIELD

## 2019-04-20 LAB — CBC WITH DIFFERENTIAL/PLATELET
Abs Immature Granulocytes: 0.1 10*3/uL — ABNORMAL HIGH (ref 0.00–0.07)
Basophils Absolute: 0 10*3/uL (ref 0.0–0.1)
Basophils Relative: 0 %
Eosinophils Absolute: 0.1 10*3/uL (ref 0.0–0.5)
Eosinophils Relative: 1 %
HCT: 22.6 % — ABNORMAL LOW (ref 39.0–52.0)
Hemoglobin: 7.3 g/dL — ABNORMAL LOW (ref 13.0–17.0)
Immature Granulocytes: 1 %
Lymphocytes Relative: 9 %
Lymphs Abs: 0.8 10*3/uL (ref 0.7–4.0)
MCH: 29.6 pg (ref 26.0–34.0)
MCHC: 32.3 g/dL (ref 30.0–36.0)
MCV: 91.5 fL (ref 80.0–100.0)
Monocytes Absolute: 1.1 10*3/uL — ABNORMAL HIGH (ref 0.1–1.0)
Monocytes Relative: 14 %
Neutro Abs: 6.3 10*3/uL (ref 1.7–7.7)
Neutrophils Relative %: 75 %
Platelets: 398 10*3/uL (ref 150–400)
RBC: 2.47 MIL/uL — ABNORMAL LOW (ref 4.22–5.81)
RDW: 13.5 % (ref 11.5–15.5)
WBC: 8.4 10*3/uL (ref 4.0–10.5)
nRBC: 0 % (ref 0.0–0.2)

## 2019-04-20 LAB — SEDIMENTATION RATE: Sed Rate: 100 mm/hr — ABNORMAL HIGH (ref 0–16)

## 2019-04-20 LAB — COMPREHENSIVE METABOLIC PANEL
ALT: 26 U/L (ref 0–44)
AST: 33 U/L (ref 15–41)
Albumin: 2.3 g/dL — ABNORMAL LOW (ref 3.5–5.0)
Alkaline Phosphatase: 78 U/L (ref 38–126)
Anion gap: 11 (ref 5–15)
BUN: 8 mg/dL (ref 6–20)
CO2: 19 mmol/L — ABNORMAL LOW (ref 22–32)
Calcium: 7.6 mg/dL — ABNORMAL LOW (ref 8.9–10.3)
Chloride: 106 mmol/L (ref 98–111)
Creatinine, Ser: 1.01 mg/dL (ref 0.61–1.24)
GFR calc Af Amer: >60 mL/min (ref >60)
GFR calc non Af Amer: >60 mL/min (ref >60)
Glucose, Bld: 116 mg/dL — ABNORMAL HIGH (ref 70–99)
Potassium: 3.4 mmol/L — ABNORMAL LOW (ref 3.5–5.1)
Sodium: 136 mmol/L (ref 135–145)
Total Bilirubin: 1.2 mg/dL (ref 0.3–1.2)
Total Protein: 6.1 g/dL — ABNORMAL LOW (ref 6.5–8.1)

## 2019-04-20 LAB — PROTHROMBIN GENE MUTATION

## 2019-04-20 LAB — MAGNESIUM: Magnesium: 2.5 mg/dL — ABNORMAL HIGH (ref 1.7–2.4)

## 2019-04-20 LAB — VANCOMYCIN, TROUGH: Vancomycin Tr: 10 ug/mL — ABNORMAL LOW (ref 15–20)

## 2019-04-20 LAB — LACTATE DEHYDROGENASE: LDH: 278 U/L — ABNORMAL HIGH (ref 98–192)

## 2019-04-20 LAB — URIC ACID: Uric Acid, Serum: 4.4 mg/dL (ref 3.7–8.6)

## 2019-04-20 LAB — C-REACTIVE PROTEIN: CRP: 25.5 mg/dL — ABNORMAL HIGH (ref <1.0)

## 2019-04-20 LAB — HEPARIN LEVEL (UNFRACTIONATED): Heparin Unfractionated: 0.6 IU/mL (ref 0.30–0.70)

## 2019-04-20 LAB — PROCALCITONIN: Procalcitonin: 0.42 ng/mL

## 2019-04-20 LAB — CK: Total CK: 624 U/L — ABNORMAL HIGH (ref 49–397)

## 2019-04-20 LAB — D-DIMER, QUANTITATIVE: D-Dimer, Quant: 7.28 ug/mL-FEU — ABNORMAL HIGH (ref 0.00–0.50)

## 2019-04-20 MED ORDER — LACTATED RINGERS IV BOLUS
1000.0000 mL | Freq: Once | INTRAVENOUS | Status: AC
  Administered 2019-04-20: 1000 mL via INTRAVENOUS

## 2019-04-20 MED ORDER — POTASSIUM CHLORIDE 10 MEQ/100ML IV SOLN
10.0000 meq | INTRAVENOUS | Status: AC
  Administered 2019-04-20 (×3): 10 meq via INTRAVENOUS
  Filled 2019-04-20 (×3): qty 100

## 2019-04-20 MED ORDER — ACETAMINOPHEN 10 MG/ML IV SOLN
1000.0000 mg | Freq: Once | INTRAVENOUS | Status: AC
  Filled 2019-04-20: qty 100

## 2019-04-20 NOTE — Progress Notes (Signed)
Referring Physician(s): Dr. Burr Medico  Supervising Physician: [name]  Patient Status:  [name] - In-pt  Chief Complaint: Seminoma, retroperitoneal lymphadenopathy IVC occlusion, bilateral pelvic DVT, LLE DVT s/p mechanical thrombectomy and catheter-directed thrombolysis with stent placement in the mid/distal IVC  Subjective: Assessed in diagnostic radiology today. Getting KUB for ileus status.  Had not yet worked with PT today.  States he has been up in his room only.  Leg remains "heavy." Still with red/dark urine.   Allergies: Patient has no known allergies.  Medications: Prior to Admission medications   Medication Sig Start Date End Date Taking? Authorizing Provider  cyclobenzaprine (FLEXERIL) 10 MG tablet Take 1 tablet (10 mg total) by mouth 3 (three) times daily as needed for muscle spasms. 04/04/19  Yes Lamptey, Myrene Galas, MD  ibuprofen (ADVIL) 600 MG tablet Take 1 tablet (600 mg total) by mouth every 6 (six) hours as needed. 04/04/19  Yes Lamptey, Myrene Galas, MD     Vital Signs: BP 137/79 (BP Location: Left Arm)   Pulse (!) 118   Temp 99.3 F (37.4 C) (Oral)   Resp (!) 24   Ht 6\' 3"  (1.905 m)   Wt 216 lb 14.9 oz (98.4 kg)   SpO2 97%   BMI 27.11 kg/m   Physical Exam  NAD, alert, resting comfortably in bed.  MSK: mild LLE swelling, continues to improve.  GU: Dark-colored urine, no change in color today.   Imaging: DG Abd 1 View  Result Date: 04/20/2019 CLINICAL DATA:  Diffuse abdominal tenderness, abdominal distension EXAM: ABDOMEN - 1 VIEW COMPARISON:  04/19/2019 FINDINGS: 2 supine frontal views of the abdomen and pelvis demonstrate persistent diffuse gaseous distention of the colon likely postoperative ileus. No masses or abnormal calcifications. Venous stent again noted. IMPRESSION: 1. Continued postoperative ileus. Electronically Signed   By: Randa Ngo M.D.   On: 04/20/2019 15:10   DG Abd 1 View  Result Date: 04/19/2019 CLINICAL DATA:  Evaluate ileus.  EXAM: ABDOMEN - 1 VIEW COMPARISON:  April 19, 2019 (acquired at 8:04 a.m. FINDINGS: Numerous dilated air-filled loops of large bowel are again seen. This is unchanged in appearance when compared to the prior study. The visualized small bowel loops are normal in caliber. A radiopaque stent is seen overlying the medial aspect of the mid right abdomen. No radio-opaque calculi or other significant radiographic abnormality are seen. IMPRESSION: 1. Stable findings likely consistent with a postoperative ileus, unchanged in appearance when compared to the prior study dated April 19, 2019 (acquired at 8:04 a.m.). Electronically Signed   By: Virgina Norfolk M.D.   On: 04/19/2019 20:28   DG Abd 1 View  Result Date: 04/19/2019 CLINICAL DATA:  Generalized abdominal pain, postoperative ileus. EXAM: ABDOMEN - 1 VIEW COMPARISON:  April 18, 2019. FINDINGS: Stable dilated air-filled colon is noted most consistent with postoperative ileus. No significant small bowel dilatation is noted. Phleboliths are noted in the pelvis. IVC stent is again noted. IMPRESSION: Stable dilated air-filled colon is noted most consistent with postoperative ileus. Electronically Signed   By: Marijo Conception M.D.   On: 04/19/2019 09:14   CT ANGIO CHEST PE W OR WO CONTRAST  Result Date: 04/19/2019 CLINICAL DATA:  Hypoxia. EXAM: CT ANGIOGRAPHY CHEST WITH CONTRAST TECHNIQUE: Multidetector CT imaging of the chest was performed using the standard protocol during bolus administration of intravenous contrast. Multiplanar CT image reconstructions and MIPs were obtained to evaluate the vascular anatomy. CONTRAST:  174mL OMNIPAQUE IOHEXOL 300 MG/ML SOLN, 33mL OMNIPAQUE  IOHEXOL 350 MG/ML SOLN COMPARISON:  04/11/2019 FINDINGS: Cardiovascular: Evaluation for acute pulmonary emboli is severely limited by respiratory motion artifact and suboptimal contrast bolus timing. Given these limitations, no large centrally located pulmonary embolism was  detected.Detection of smaller pulmonary emboli is not possible on this exam. The main pulmonary artery is within normal limits for size. There is no CT evidence of acute right heart strain. The visualized aorta is normal. Heart size is normal, without pericardial effusion. Mediastinum/Nodes: --No mediastinal or hilar lymphadenopathy. --No axillary lymphadenopathy. --there is an enlarged left supraclavicular lymph node measuring 1.4 cm (axial series 5, image 5). --Normal thyroid gland. --The esophagus is unremarkable Lungs/Pleura: There is atelectasis at the lung bases bilaterally. There are new small bilateral pleural effusions, left greater than right. Upper Abdomen: No acute abnormality. Musculoskeletal: No chest wall abnormality. No acute or significant osseous findings. Review of the MIP images confirms the above findings. IMPRESSION: 1. Evaluation for acute pulmonary emboli is severely limited by respiratory motion artifact and suboptimal contrast bolus timing. Given these limitations, no large centrally located pulmonary embolism was detected. 2. New small bilateral pleural effusions, left greater than right. 3. Enlarged left supraclavicular lymph node measuring 1.4 cm. This may be malignant or reactive in etiology. It is amenable to percutaneous biopsy as clinically indicated. Electronically Signed   By: Constance Holster M.D.   On: 04/19/2019 23:17   CT ABDOMEN PELVIS W CONTRAST  Result Date: 04/18/2019 CLINICAL DATA:  Testicular carcinoma post RIGHT orchiectomy, LEFT lower extremity DVT, abdominal distension, recent CT with intra-abdominal and retroperitoneal lymphadenopathy concerning for metastatic cancer EXAM: CT ABDOMEN AND PELVIS WITH CONTRAST TECHNIQUE: Multidetector CT imaging of the abdomen and pelvis was performed using the standard protocol following bolus administration of intravenous contrast. Sagittal and coronal MPR images reconstructed from axial data set. CONTRAST:  177mL OMNIPAQUE  IOHEXOL 300 MG/ML SOLN IV. No oral contrast. COMPARISON:  04/11/2019 FINDINGS: Lower chest: Dependent bibasilar atelectasis in the lower lobes Hepatobiliary: New dependent density within gallbladder question vicarious excretion of contrast material from prior CT exam versus sludge. No gallbladder wall thickening. Liver unremarkable. Pancreas: Normal appearance Spleen: Normal appearance Adrenals/Urinary Tract: Adrenal glands, kidneys, and ureters normal appearance. Foley catheter and air within urinary bladder. Stomach/Bowel: Retrocecal appendix, normal. Gaseous distention of colon from cecum to descending colon. Small amount of stool in fluid within proximal colon. Tapering of the distal colon with few scattered distal colonic diverticula but no evidence of diverticulitis. Rectosigmoid colon decompressed. No discrete point of obstruction seen; findings favor ileus. Stomach and small bowel loops unremarkable Vascular/Lymphatic: Prior stenting of the IVC. Aorta normal caliber without aneurysm. IVC, iliac veins, common femoral veins patent without thrombus. Previously identified thrombus LEFT external iliac vein no longer seen. Again identified significant retroperitoneal adenopathy unchanged. Reproductive: Seminal vesicles unremarkable. Minimal prostatic prominence for age. Other: Scattered stranding of retroperitoneal tissue planes increased from previous exam. No free air or free fluid. Tiny LEFT inguinal hernia containing fat Musculoskeletal: Osseous structures unremarkable. New enlargement of the LEFT psoas muscle with in ill-defined area of central low attenuation approximately 3.7 x 3.9 cm in axial dimensions extending 12 cm length, could represent LEFT psoas abscess or hematoma. This is new since 04/11/2019 making tumor infiltration unlikely. IMPRESSION: Mild gaseous distention of the colon through descending colon with decompressed rectosigmoid colon; no point of obstruction is identified, favor ileus.  Persistent retroperitoneal adenopathy suspicious for metastatic disease. New ill-defined fluid collection within LEFT psoas muscle 3.7 x 3.9 x 12 cm question psoas  abscess versus hematoma; LEFT psoas muscle was traversed by prior CT guided retroperitoneal lymph node biopsy. Increased retroperitoneal and mesenteric stranding. Bibasilar atelectasis. Findings called to Dr. Zigmund Daniel on 04/18/2019 at 1108 hours. Electronically Signed   By: Lavonia Dana M.D.   On: 04/18/2019 11:08   US RENAL  Result Date: 04/17/2019 CLINICAL DATA:  Urinary retention.  History of testicular carcinoma. EXAM: RENAL / URINARY TRACT ULTRASOUND COMPLETE COMPARISON:  CT scan 04/11/2019 FINDINGS: Right Kidney: Renal measurements: 13.5 x 5.5 x 6.6 cm = volume: 260.0 mL . Normal renal cortical thickness and echogenicity without focal lesions or hydronephrosis. Left Kidney: Renal measurements: 12.0 x 6.8 x 5.4 cm = volume: 229.4 mL. Normal renal cortical thickness and echogenicity without focal lesions or hydronephrosis. Bladder: Decompressed by Foley catheter. Other: None. IMPRESSION: Normal sonographic appearance of both kidneys.  No hydronephrosis. Bladder decompressed by a Foley catheter. Electronically Signed   By: Marijo Sanes M.D.   On: 04/17/2019 11:56   DG Chest Port 1 View  Result Date: 04/18/2019 CLINICAL DATA:  Fever, testicular cancer EXAM: PORTABLE CHEST 1 VIEW COMPARISON:  None. FINDINGS: Normal heart size. Normal mediastinal contour. No pneumothorax. No pleural effusion. Slightly low lung volumes. Minimal streaky opacities at both lung bases. No pulmonary edema. IMPRESSION: Slightly low lung volumes. Minimal streaky opacities at the lung bases, favoring minimal atelectasis. No consolidative airspace disease to suggest a pneumonia. Electronically Signed   By: Ilona Sorrel M.D.   On: 04/18/2019 15:41   DG Abd Portable 1V  Result Date: 04/18/2019 CLINICAL DATA:  Abdominal pain and distension EXAM: PORTABLE ABDOMEN - 1 VIEW  COMPARISON:  04/11/2019 FINDINGS: Supine frontal view of the abdomen and pelvis excludes the hemidiaphragms, right flank, and lower pelvis by collimation. There is diffuse gaseous distention of the colon, likely reflecting postoperative ileus. Stable position of the IVC stent. No masses or abnormal calcifications. IMPRESSION: 1. Distended gas-filled colon consistent with postprocedural ileus. 2. IVC stent as above Electronically Signed   By: Randa Ngo M.D.   On: 04/18/2019 08:41   ECHOCARDIOGRAM COMPLETE  Result Date: 04/19/2019   ECHOCARDIOGRAM REPORT   Patient Name:   IZYAN MAGSTADT Date of Exam: 04/19/2019 Medical Rec #:  [id number]      Height:       75.0 in Accession #:    [id number]     Weight:       216.9 lb Date of Birth:  02/07/1981       BSA:          2.27 m Patient Age:    38 years       BP:           132/83 mmHg Patient Gender: M              HR:           125 bpm. Exam Location:  Inpatient Procedure: 2D Echo, Color Doppler and Cardiac Doppler                          MODIFIED REPORT: This report was modified by Cherlynn Kaiser MD on 04/19/2019 due to.  Indications:     Fever  History:         Patient has no prior history of Echocardiogram examinations.                  Testicular cancer; Signs/Symptoms:Fever.  Sonographer:     Johny Chess Referring Phys:  [id number]  G MATHEWS Diagnosing Phys: Cherlynn Kaiser MD  Sonographer Comments: Suboptimal apical window. IMPRESSIONS  1. Left ventricular ejection fraction, by visual estimation, is 60 to 65%. The left ventricle has normal function. Mildly increased left ventricular posterior wall thickness. There is mildly increased left ventricular hypertrophy.  2. Global right ventricle has normal systolic function.The right ventricular size is normal. Right vetricular wall thickness was not assessed.  3. The mitral valve is normal in structure. No evidence of mitral valve regurgitation. No evidence of mitral stenosis.  4. The tricuspid valve is  normal in structure. Tricuspid valve regurgitation is not demonstrated.  5. The aortic valve is normal in structure. Aortic valve regurgitation is not visualized. No evidence of aortic valve sclerosis or stenosis.  6. The inferior vena cava is normal in size with greater than 50% respiratory variability, suggesting right atrial pressure of 3 mmHg.  7. No evidence of valvular vegetations on this transthoracic echocardiogram, however acoustic windows are suboptimal. FINDINGS  Left Ventricle: Left ventricular ejection fraction, by visual estimation, is 60 to 65%. The left ventricle has normal function. The left ventricle is not well visualized. Mildly increased left ventricular posterior wall thickness. There is mildly increased left ventricular hypertrophy. Left ventricular diastolic parameters were normal. Normal left atrial pressure. Right Ventricle: The right ventricular size is normal. Right vetricular wall thickness was not assessed. Global RV systolic function is has normal systolic function. Left Atrium: Left atrial size was normal in size. Right Atrium: Right atrial size was normal in size Pericardium: There is no evidence of pericardial effusion. Mitral Valve: The mitral valve is normal in structure. No evidence of mitral valve regurgitation. No evidence of mitral valve stenosis by observation. Tricuspid Valve: The tricuspid valve is normal in structure. Tricuspid valve regurgitation is not demonstrated. Aortic Valve: The aortic valve is normal in structure. Aortic valve regurgitation is not visualized. The aortic valve is structurally normal, with no evidence of sclerosis or stenosis. Pulmonic Valve: The pulmonic valve was normal in structure. Pulmonic valve regurgitation is not visualized. Pulmonic regurgitation is not visualized. Aorta: The aortic root is normal in size and structure. Venous: The inferior vena cava is normal in size with greater than 50% respiratory variability, suggesting right atrial  pressure of 3 mmHg. IAS/Shunts: The interatrial septum was not well visualized. Additional Comments: No evidence of valvular vegetations on this transthoracic echocardiogram. Would recommend a transesophageal echocardiogram to exclude infective endocarditis if clinically indicated.  LEFT VENTRICLE PLAX 2D LVIDd:         5.00 cm  Diastology LVIDs:         3.10 cm  LV e' lateral: 14.10 cm/s LV PW:         1.10 cm  LV e' medial:  12.30 cm/s LV IVS:        0.80 cm LVOT diam:     2.40 cm LV SV:         80 ml LV SV Index:   35.02 LVOT Area:     4.52 cm  RIGHT VENTRICLE RV S prime:     27.10 cm/s TAPSE (M-mode): 2.8 cm LEFT ATRIUM           Index LA diam:      3.40 cm 1.50 cm/m LA Vol (A4C): 38.8 ml 17.08 ml/m  AORTIC VALVE LVOT Vmax:   105.00 cm/s LVOT Vmean:  74.200 cm/s LVOT VTI:    0.167 m  AORTA Ao Root diam: 3.60 cm  SHUNTS Systemic VTI:  0.17 m Systemic  Diam: 2.40 cm  Cherlynn Kaiser MD Electronically signed by Cherlynn Kaiser MD Signature Date/Time: 04/19/2019/7:59:29 PM    Final (Updated)     Labs:  CBC: Recent Labs    04/17/19 0018 04/18/19 1051 04/19/19 0151 04/20/19 0231  WBC 11.4* 14.1* 13.2* 8.4  HGB 11.0* 8.3* 8.5* 7.3*  HCT 32.8* 25.4* 26.0* 22.6*  PLT 215 254 337 398    COAGS: Recent Labs    04/11/19 1258  INR 1.0    BMP: Recent Labs    04/17/19 0018 04/18/19 1051 04/19/19 0151 04/20/19 0231  NA 130* 132* 134* 136  K 3.9 4.2 3.9 3.4*  CL 100 99 102 106  CO2 24 23 22  19*  GLUCOSE 110* 105* 107* 116*  BUN 8 6 6 8   CALCIUM 7.8* 7.7* 7.6* 7.6*  CREATININE 1.02 0.91 0.95 1.01  GFRNONAA >60 >60 >60 >60  GFRAA >60 >60 >60 >60    LIVER FUNCTION TESTS: Recent Labs    04/11/19 0432 04/18/19 1051 04/19/19 0151 04/20/19 0231  BILITOT 0.8 1.0 1.0 1.2  AST 17 30 30  33  ALT 16 19 24 26   ALKPHOS 59 89 84 78  PROT 7.8 5.5* 6.1* 6.1*  ALBUMIN 3.9 2.3* 2.3* 2.3*    Assessment and Plan: Seminoma, retroperitoneal lymphadenopathy s/p percutaneous biopsy  1/27 Continues with persistent fevers and tachycardia.  WBC normalized; now 8.4 Tmax 102.1 Continues vanc, cefepime, and flagyl  CCM consulted to assess for possible infection.  Note possibility fevers are VTE vs. Malignancy vs. Ileus-related.  Hematuria Patient with dark-colored urine which is unchanged today.  Discoloration is likely from breakdown of blood products.  Bolus 1L lactated ringers. Scr 1.01  IVC occlusion, bilateral pelvic DVT, LLE DVT s/p mechanical thrombectomy and catheter-directed thrombolysis with stent placement in the mid/distal IVC  Swelling in his LLE has significantly improved.  Celesta Gentile bought compression socks which can be used daily. Planning to work with PT this afternoon.  Currently NPO, but did have a BM this AM.   Plan as previously outlined: VIR has discussed Mr Estis's current case and care with the VIR department at [facility].   Dr. Lavon Paganini, Assistant Professor of Radiology, has kindly agreed to see Mr Dombrowski in continuity of his current vascular care, when Mr Borner returns home.  Scheduling: [place]: [phone]  Dr. Ottis Stain has also kindly arranged follow-up with the Medical Oncology Department of [facility], and Mr Snedeker should have a pending office appointment upon arrival.   Our VIR recommendations on DC from [place]: - Continue anti-coagulation with 30 days of weight-based lovenox - after 30 days of weight-based lovenox, transition to oral anticoagulation of managing physician's choice - 90 days of 81mg  ASA daily - Bilateral lower extremity compression stockings, at least knee-high - please have the patient bring a copy of current medical records with him to facilitate care on DC   Electronically Signed: Docia Barrier, PA 04/20/2019, 3:58 PM   I spent a total of 25 Minutes at the the patient's bedside AND on the patient's hospital floor or unit, greater than 50% of  which was counseling/coordinating care for IVC occlusion, bilateral pelvic DVT, LLE DVT s/p mechanical thrombectomy and catheter-directed thrombolysis with stent placement in the mid/distal IVC

## 2019-04-20 NOTE — Progress Notes (Signed)
Inpatient Rehabilitation-Admissions Coordinator   Met with pt bedside for rehab assessment. Introduced pt to the recommended IP Rehab program and discussed program details, expectations, anticipated LOS, and anticipated outcomes. Pt currently unsure if he would like to proceed with the program. I provided him with brochures and handouts to look over this morning. I will plan to follow up with pt to see if he has any questions about the information provided.   Raechel Ache, OTR/L  Rehab Admissions Coordinator  [phone] 04/20/2019 10:30 AM

## 2019-04-20 NOTE — Progress Notes (Signed)
Physical Therapy Treatment Patient Details Name: Timothy [name] MRN: [id number] DOB: Jul 24, 1980 Today's Date: 04/20/2019    History of Present Illness Pt is a 39 y.o. male admitted 04/10/19 with LLE swelling and back pain; pt lives in [place] but drove down to [place] to visit mother ~1 wk ago. Found to have extensive LLE DVT; also with significant lymphadenopathy in the retroperitoneal area concerning for metastatic disease of unknown primary. Biopsy consistent with metastatic seminoma. S/p mechanical and pharmacological thrombolysis with stenting of the IVC for resistant malignant narrowing. Pt with ilieus 2/1. 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    PT Comments    Pt reporting increased fatigue and bilateral LE pain L>R, but agreeable to mobility and exercise. Pt states he has been ambulating in room today with assist. Pt tolerated EOB sitting x15 minutes for LE exercises to address functional weakness and LE ROM deficits. Pt also performed repeated transfer training well with mod physical assist from PT, limited by tachycardia up to 137 bpm and pain. PT continuing to recommend CIR to maximize pt function and mobility. Will continue to follow acutely.    Follow Up Recommendations  Supervision for mobility/OOB;CIR     Equipment Recommendations  Rolling walker with 5" wheels    Recommendations for Other Services       Precautions / Restrictions Precautions Precautions: Fall Precaution Comments: weeping blisters on posterior knee Restrictions Weight Bearing Restrictions: No    Mobility  Bed Mobility Overal bed mobility: Needs Assistance Bed Mobility: Supine to Sit;Sit to Supine     Supine to sit: HOB elevated;Min assist Sit to supine: Mod assist;HOB elevated   General bed mobility comments: Min-mod assist for supine<>sit for bilateral LE lifting, translation into and out of bed, and positioning.  Transfers Overall transfer level:  Needs assistance Equipment used: Rolling walker (2 wheeled) Transfers: Sit to/from Stand Sit to Stand: Mod assist         General transfer comment: Mod assist for power up, steadying, hip extension and trunk elevation to upright. Pt holding LLE in hip and knee flexion, minimizing WB. Sit to stand x3 for LE strengthening and transfer training.  Ambulation/Gait             General Gait Details: unable   Stairs             Wheelchair Mobility    Modified Rankin (Stroke Patients Only)       Balance Overall balance assessment: Needs assistance Sitting-balance support: No upper extremity supported;Single extremity supported Sitting balance-Leahy Scale: Good Sitting balance - Comments: able to sit EOB without PT support   Standing balance support: During functional activity Standing balance-Leahy Scale: Poor Standing balance comment: reliant on RW in standing                            Cognition Arousal/Alertness: Awake/alert Behavior During Therapy: WFL for tasks assessed/performed Overall Cognitive Status: Within Functional Limits for tasks assessed                                        Exercises General Exercises - Lower Extremity Long Arc QuadSinclair Ship;Both;10 reps;Seated Heel Slides: Both;10 reps;Seated;AAROM(with pillowcase under pt foot to assist in knee flexion/extension) Mini-Sqauts: AAROM;Seated;Standing(sit to stand from elevated bed height, x3)    General Comments General comments (skin integrity,  edema, etc.): HRmax observed 137 bpm      Pertinent Vitals/Pain Pain Assessment: Faces Faces Pain Scale: Hurts even more Pain Location: LLE Pain Descriptors / Indicators: Sore;Aching;Guarding;Grimacing;Sharp Pain Intervention(s): Limited activity within patient's tolerance;Monitored during session;Premedicated before session;Repositioned    Home Living                      Prior Function            PT Goals  (current goals can now be found in the care plan section) Acute Rehab PT Goals Patient Stated Goal: to get back home and to PLOF PT Goal Formulation: With patient Time For Goal Achievement: 05/02/19 Potential to Achieve Goals: Good Progress towards PT goals: Progressing toward goals    Frequency    Min 3X/week      PT Plan Current plan remains appropriate    Co-evaluation              AM-PAC PT "6 Clicks" Mobility   Outcome Measure  Help needed turning from your back to your side while in a flat bed without using bedrails?: A Lot Help needed moving from lying on your back to sitting on the side of a flat bed without using bedrails?: A Lot Help needed moving to and from a bed to a chair (including a wheelchair)?: A Lot Help needed standing up from a chair using your arms (e.g., wheelchair or bedside chair)?: A Lot Help needed to walk in hospital room?: A Lot Help needed climbing 3-5 steps with a railing? : A Lot 6 Click Score: 12    End of Session   Activity Tolerance: Patient limited by fatigue;Patient limited by pain Patient left: with call bell/phone within reach;in bed;with family/visitor present Nurse Communication: Mobility status PT Visit Diagnosis: Other abnormalities of gait and mobility (R26.89);Pain;Difficulty in walking, not elsewhere classified (R26.2) Pain - Right/Left: (groin)     Time: [id number] PT Time Calculation (min) (ACUTE ONLY): 24 min  Charges:  $Therapeutic Exercise: 8-22 mins $Therapeutic Activity: 8-22 mins                    Clementine Soulliere E, PT Acute Rehabilitation Services Pager [phone]  Office [phone]  [name] 04/20/2019, 5:22 PM

## 2019-04-20 NOTE — Progress Notes (Addendum)
PROGRESS NOTE    Timothy [name]  [id number] DOB: [birth year] DOA: 04/10/2019 PCP: Patient, No Pcp Per  Brief Narrative: 39 year old male with history of testicular carcinoma stage Ib followed at [facility] had right orchiectomy lives in [place] travel to [place] now admitted with left lower extremity extensive DVT with intra-abdominal and retroperitoneal lymphadenopathy concerning for metastatic cancer.  04/19/2019-patient was seen multiple times today due to yellow to red mucus.  Fianc by the bedside.  Overall he is feeling slightly better than yesterday.  He continues to be n.p.o. with ileus.  Denies any worsening of pain.  He was seen by PT yesterday.  He denies any chest pain shortness of breath cough diarrhea.  He has pain in his left lower extremity but it is not worse.  04/20/2019 patient resting in bed had multiple bowel movements overnight abdomen still distended no nausea vomiting ambulated in the room yesterday. Appreciate all the consultants.  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 Continue heparinfor now. Will switch to Lovenox 1 mg/kg every 12 closer to when he is ready for discharge. Lovenox to be continued for 30 days once discharged then transition to oral anticoagulation 90 days of aspirin 81 mg daily Bilateral lower extremity compression stockings at least knee-high Out of bed ambulate  Will DC Foley once he is able to ambulate  #2 fever with tachycardia-source  of infection not yet known.  He is on Vanco and cefepime.  His T-max is 102.1.  His fever curve is trending down.  White count has normalized.  I will DC vancomycin.  Continue cefepime and Flagyl.  Flagyl was added 04/19/2019.  Still the source of infection is unknown.  Discussed with infectious disease, PCCM Echo normal ejection fraction, no evidence of valvular infection CT chest no evidence of PE CPK 624, LDH 278, CRP 25.5, procalcitonin 0.42, white count 8.4, sed rate 100, D-dimer 7.28. He was Covid negative on admission. Discussed with ID recommends no need to repeat Covid test.  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  Discussed with urology. CT abdomen and pelvis -shows ileus and psoas fluid collection likely hematoma.  Discussed with IR .  They think this fluid collection is more likely to be hematoma than an abscess and not recommending I&D at this time.  #4 metastatic testicular cancer-he will follow-up at [facility] for further treatments once he is discharged.Appreciate oncology and IR input reviewed.  #5 ileus with abdominal distention and abdominal tenderness.  Slightly better had few bowel movements overnight.  Replete potassium to keep it above 4.  Check magnesium.  Out of bed ambulate.  Minimize narcotics.  Follow-up K[facility].   Per Dr. Stephenie Acres. Lavon Paganini, Assistant Professor of Radiology, has kindly agreed to see [name] in continuity of his current vascular care, when [name] returns home.  Scheduling: [place]: [phone]  Dr. Ottis Stain has also kindly arranged follow-up with the Medical Oncology Department of [facility], and [name] should have a pending office  appointment upon arrival.     Estimated body mass index is 27.11 kg/m as calculated from the following:   Height as of this encounter: 6\' 3"  (1.905 m).   Weight as of this encounter:  98.4 kg.  DVT prophylaxis:IV heparin Code Status:Full code Family Communication:Discussed with patient and fianc at bedside. disposition Plan:Patient came from home plan is to return to home. Barriers to discharge-patient spiking fever and tachycardic with unknown source of infection and currently have postop ileus, hematuria  Consultants:  Interventional radiology, PCCM  Procedures:CT-guided biopsy of the lymph node and mechanical and thrombolytic intervention to left lower extremity for acute DVT Antimicrobials:Vanco and cefepime 04/18/2019  Subjective: Patient resting in bed denies any nausea vomiting had bowel movements overnight still abdomen is distended Still tachycardic  Objective: Vitals:   04/20/19 0229 04/20/19 0620 04/20/19 0848 04/20/19 1157  BP: 121/77 126/69 129/70 132/78  Pulse: (!) 125 (!) 119 (!) 126 (!) 120  Resp: (!) 22 18  20   Temp: (!) 102.1 F (38.9 C) (!) 100.7 F (38.2 C) 99.2 F (37.3 C) 98.9 F (37.2 C)  TempSrc: Oral Oral Oral Oral  SpO2: 95% 96% 99% 99%  Weight:      Height:        Intake/Output Summary (Last 24 hours) at 04/20/2019 1302 Last data filed at 04/20/2019 [id number] Gross per 24 hour  Intake --  Output 2650 ml  Net -2650 ml   Filed Weights   04/10/19 2005 04/18/19 0600  Weight: 90.7 kg 98.4 kg    Examination:  General exam: Appears calm and comfortable  Respiratory system: Clear to auscultation. Respiratory effort normal. Cardiovascular system: S1 & S2 heard, RRR. No JVD, murmurs, rubs, gallops or clicks. No pedal edema. Gastrointestinal system: Abdomen is nondistended, soft and nontender. No organomegaly or masses felt. Normal bowel sounds heard. Central nervous system: Alert and oriented. No focal neurological deficits. Extremities: Symmetric 5 x 5 power. Skin: No rashes, lesions or ulcers Psychiatry: Judgement and insight appear normal. Mood & affect appropriate.     Data Reviewed: I have personally reviewed  following labs and imaging studies  CBC: Recent Labs  Lab 04/15/19 0751 04/15/19 1805 04/16/19 1202 04/17/19 0018 04/18/19 1051 04/19/19 0151 04/20/19 0231  WBC 6.7   < > 9.1 11.4* 14.1* 13.2* 8.4  NEUTROABS 4.8  --   --  9.0*  --   --  6.3  HGB 13.5   < > 12.7* 11.0* 8.3* 8.5* 7.3*  HCT 41.1   < > 39.2 32.8* 25.4* 26.0* 22.6*  MCV 91.3   < > 92.0 88.9 92.0 91.9 91.5  PLT 265   < > 225 215 254 337 398   < > = values in this interval not displayed.   Basic Metabolic Panel: Recent Labs  Lab 04/15/19 2317 04/17/19 0018 04/18/19 1051 04/19/19 0151 04/20/19 0231  NA 133* 130* 132* 134* 136  K 4.0 3.9 4.2 3.9 3.4*  CL 98 100 99 102 106  CO2 24 24 23 22  19*  GLUCOSE 102* 110* 105* 107* 116*  BUN 16 8 6 6 8   CREATININE 1.27* 1.02 0.91 0.95 1.01  CALCIUM 8.6* 7.8* 7.7* 7.6* 7.6*   GFR: Estimated Creatinine Clearance: 120.8 mL/min (by C-G formula based on SCr of 1.01 mg/dL). Liver Function Tests: Recent Labs  Lab 04/18/19 1051 04/19/19 0151 04/20/19 0231  AST 30 30 33  ALT 19 24 26   ALKPHOS 89 84 78  BILITOT 1.0 1.0 1.2  PROT 5.5* 6.1*  6.1*  ALBUMIN 2.3* 2.3* 2.3*   No results for input(s): LIPASE, AMYLASE in the last 168 hours. No results for input(s): AMMONIA in the last 168 hours. Coagulation Profile: No results for input(s): INR, PROTIME in the last 168 hours. Cardiac Enzymes: Recent Labs  Lab 04/20/19 0231  CKTOTAL 624*   BNP (last 3 results) No results for input(s): PROBNP in the last 8760 hours. HbA1C: No results for input(s): HGBA1C in the last 72 hours. CBG: Recent Labs  Lab 04/16/19 0729 04/16/19 1124  GLUCAP 115* 86   Lipid Profile: No results for input(s): CHOL, HDL, LDLCALC, TRIG, CHOLHDL, LDLDIRECT in the last 72 hours. Thyroid Function Tests: No results for input(s): TSH, T4TOTAL, FREET4, T3FREE, THYROIDAB in the last 72 hours. Anemia Panel: No results for input(s): VITAMINB12, FOLATE, FERRITIN, TIBC, IRON, RETICCTPCT in the last 72  hours. Sepsis Labs: Recent Labs  Lab 04/18/19 1700 04/18/19 1822 04/18/19 2105 04/19/19 0151 04/20/19 0231  PROCALCITON 0.42 0.46  --  0.51 0.42  LATICACIDVEN  --  1.3 1.2  --   --     Recent Results (from the past 240 hour(s))  SARS CORONAVIRUS 2 (TAT 6-24 HRS) Nasopharyngeal Nasopharyngeal Swab     Status: None   Collection Time: 04/11/19 12:32 AM   Specimen: Nasopharyngeal Swab  Result Value Ref Range Status   SARS Coronavirus 2 NEGATIVE NEGATIVE Final    Comment: (NOTE) SARS-CoV-2 target nucleic acids are NOT DETECTED. The SARS-CoV-2 RNA is generally detectable in upper and lower respiratory specimens during the acute phase of infection. Negative results do not preclude SARS-CoV-2 infection, do not rule out co-infections with other pathogens, and should not be used as the sole basis for treatment or other patient management decisions. Negative results must be combined with clinical observations, patient history, and epidemiological information. The expected result is Negative. Fact Sheet for Patients: SugarRoll.be Fact Sheet for Healthcare Providers: https://www.[employer].com/ This test is not yet approved or cleared by the [place] FDA and  has been authorized for detection and/or diagnosis of SARS-CoV-2 by FDA under an Emergency Use Authorization (EUA). This EUA will remain  in effect (meaning this test can be used) for the duration of the COVID-19 declaration under Section 56 4(b)(1) of the Act, 21 U.S.C. section 360bbb-3(b)(1), unless the authorization is terminated or revoked sooner. Performed at [facility], [place] [address], [place], [place] [address]   Culture, blood (routine x 2)     Status: None (Preliminary result)   Collection Time: 04/17/19 10:15 AM   Specimen: BLOOD RIGHT HAND  Result Value Ref Range Status   Specimen Description BLOOD RIGHT HAND  Final   Special Requests   Final    BOTTLES DRAWN  AEROBIC ONLY Blood Culture results may not be optimal due to an inadequate volume of blood received in culture bottles   Culture   Final    NO GROWTH 3 DAYS Performed at [facility], [place] [address] [place], [place] [address]    Report Status PENDING  Incomplete  Culture, blood (routine x 2)     Status: None (Preliminary result)   Collection Time: 04/17/19 10:20 AM   Specimen: BLOOD  Result Value Ref Range Status   Specimen Description BLOOD LEFT ANTECUBITAL  Final   Special Requests   Final    BOTTLES DRAWN AEROBIC AND ANAEROBIC Blood Culture adequate volume   Culture   Final    NO GROWTH 3 DAYS Performed at [facility], [address] [place], [place] [address]    Report Status PENDING  Incomplete  Culture, Urine     Status: None   Collection Time: 04/17/19 10:43 AM   Specimen: Urine, Random  Result Value Ref Range Status   Specimen Description URINE, RANDOM  Final   Special Requests NONE  Final   Culture   Final    NO GROWTH Performed at [facility], [place] [address] [place], [place] [address]    Report Status 04/18/2019 FINAL  Final         Radiology Studies: DG Abd 1 View  Result Date: 04/19/2019 CLINICAL DATA:  Evaluate ileus. EXAM: ABDOMEN - 1 VIEW COMPARISON:  April 19, 2019 (acquired at 8:04 a.m. FINDINGS: Numerous dilated air-filled loops of large bowel are again seen. This is unchanged in appearance when compared to the prior study. The visualized small bowel loops are normal in caliber. A radiopaque stent is seen overlying the medial aspect of the mid right abdomen. No radio-opaque calculi or other significant radiographic abnormality are seen. IMPRESSION: 1. Stable findings likely consistent with a postoperative ileus, unchanged in appearance when compared to the prior study dated April 19, 2019 (acquired at 8:04 a.m.). Electronically Signed   By: Virgina Norfolk M.D.   On: 04/19/2019 20:28   DG Abd 1 View  Result Date:  04/19/2019 CLINICAL DATA:  Generalized abdominal pain, postoperative ileus. EXAM: ABDOMEN - 1 VIEW COMPARISON:  April 18, 2019. FINDINGS: Stable dilated air-filled colon is noted most consistent with postoperative ileus. No significant small bowel dilatation is noted. Phleboliths are noted in the pelvis. IVC stent is again noted. IMPRESSION: Stable dilated air-filled colon is noted most consistent with postoperative ileus. Electronically Signed   By: Marijo Conception M.D.   On: 04/19/2019 09:14   CT ANGIO CHEST PE W OR WO CONTRAST  Result Date: 04/19/2019 CLINICAL DATA:  Hypoxia. EXAM: CT ANGIOGRAPHY CHEST WITH CONTRAST TECHNIQUE: Multidetector CT imaging of the chest was performed using the standard protocol during bolus administration of intravenous contrast. Multiplanar CT image reconstructions and MIPs were obtained to evaluate the vascular anatomy. CONTRAST:  191mL OMNIPAQUE IOHEXOL 300 MG/ML SOLN, 80mL OMNIPAQUE IOHEXOL 350 MG/ML SOLN COMPARISON:  04/11/2019 FINDINGS: Cardiovascular: Evaluation for acute pulmonary emboli is severely limited by respiratory motion artifact and suboptimal contrast bolus timing. Given these limitations, no large centrally located pulmonary embolism was detected.Detection of smaller pulmonary emboli is not possible on this exam. The main pulmonary artery is within normal limits for size. There is no CT evidence of acute right heart strain. The visualized aorta is normal. Heart size is normal, without pericardial effusion. Mediastinum/Nodes: --No mediastinal or hilar lymphadenopathy. --No axillary lymphadenopathy. --there is an enlarged left supraclavicular lymph node measuring 1.4 cm (axial series 5, image 5). --Normal thyroid gland. --The esophagus is unremarkable Lungs/Pleura: There is atelectasis at the lung bases bilaterally. There are new small bilateral pleural effusions, left greater than right. Upper Abdomen: No acute abnormality. Musculoskeletal: No chest wall  abnormality. No acute or significant osseous findings. Review of the MIP images confirms the above findings. IMPRESSION: 1. Evaluation for acute pulmonary emboli is severely limited by respiratory motion artifact and suboptimal contrast bolus timing. Given these limitations, no large centrally located pulmonary embolism was detected. 2. New small bilateral pleural effusions, left greater than right. 3. Enlarged left supraclavicular lymph node measuring 1.4 cm. This may be malignant or reactive in etiology. It is amenable to percutaneous biopsy as clinically indicated. Electronically Signed  By: Constance Holster M.D.   On: 04/19/2019 23:17   DG Chest Port 1 View  Result Date: 04/18/2019 CLINICAL DATA:  Fever, testicular cancer EXAM: PORTABLE CHEST 1 VIEW COMPARISON:  None. FINDINGS: Normal heart size. Normal mediastinal contour. No pneumothorax. No pleural effusion. Slightly low lung volumes. Minimal streaky opacities at both lung bases. No pulmonary edema. IMPRESSION: Slightly low lung volumes. Minimal streaky opacities at the lung bases, favoring minimal atelectasis. No consolidative airspace disease to suggest a pneumonia. Electronically Signed   By: Ilona Sorrel M.D.   On: 04/18/2019 15:41   ECHOCARDIOGRAM COMPLETE  Result Date: 04/19/2019   ECHOCARDIOGRAM REPORT   Patient Name:   CADEL OVERMAN Date of Exam: 04/19/2019 Medical Rec #:  [id number]      Height:       75.0 in Accession #:    [id number]     Weight:       216.9 lb Date of Birth:  08-15-1980       BSA:          2.27 m Patient Age:    38 years       BP:           132/83 mmHg Patient Gender: M              HR:           125 bpm. Exam Location:  Inpatient Procedure: 2D Echo, Color Doppler and Cardiac Doppler                          MODIFIED REPORT: This report was modified by Cherlynn Kaiser MD on 04/19/2019 due to.  Indications:     Fever  History:         Patient has no prior history of Echocardiogram examinations.                  Testicular  cancer; Signs/Symptoms:Fever.  Sonographer:     Johny Chess Referring Phys:  [id number] Noland Fordyce Addilyn Satterwhite Diagnosing Phys: Cherlynn Kaiser MD  Sonographer Comments: Suboptimal apical window. IMPRESSIONS  1. Left ventricular ejection fraction, by visual estimation, is 60 to 65%. The left ventricle has normal function. Mildly increased left ventricular posterior wall thickness. There is mildly increased left ventricular hypertrophy.  2. Global right ventricle has normal systolic function.The right ventricular size is normal. Right vetricular wall thickness was not assessed.  3. The mitral valve is normal in structure. No evidence of mitral valve regurgitation. No evidence of mitral stenosis.  4. The tricuspid valve is normal in structure. Tricuspid valve regurgitation is not demonstrated.  5. The aortic valve is normal in structure. Aortic valve regurgitation is not visualized. No evidence of aortic valve sclerosis or stenosis.  6. The inferior vena cava is normal in size with greater than 50% respiratory variability, suggesting right atrial pressure of 3 mmHg.  7. No evidence of valvular vegetations on this transthoracic echocardiogram, however acoustic windows are suboptimal. FINDINGS  Left Ventricle: Left ventricular ejection fraction, by visual estimation, is 60 to 65%. The left ventricle has normal function. The left ventricle is not well visualized. Mildly increased left ventricular posterior wall thickness. There is mildly increased left ventricular hypertrophy. Left ventricular diastolic parameters were normal. Normal left atrial pressure. Right Ventricle: The right ventricular size is normal. Right vetricular wall thickness was not assessed. Global RV systolic function is has normal systolic function. Left Atrium: Left atrial size was normal in size. Right Atrium:  Right atrial size was normal in size Pericardium: There is no evidence of pericardial effusion. Mitral Valve: The mitral valve is normal in  structure. No evidence of mitral valve regurgitation. No evidence of mitral valve stenosis by observation. Tricuspid Valve: The tricuspid valve is normal in structure. Tricuspid valve regurgitation is not demonstrated. Aortic Valve: The aortic valve is normal in structure. Aortic valve regurgitation is not visualized. The aortic valve is structurally normal, with no evidence of sclerosis or stenosis. Pulmonic Valve: The pulmonic valve was normal in structure. Pulmonic valve regurgitation is not visualized. Pulmonic regurgitation is not visualized. Aorta: The aortic root is normal in size and structure. Venous: The inferior vena cava is normal in size with greater than 50% respiratory variability, suggesting right atrial pressure of 3 mmHg. IAS/Shunts: The interatrial septum was not well visualized. Additional Comments: No evidence of valvular vegetations on this transthoracic echocardiogram. Would recommend a transesophageal echocardiogram to exclude infective endocarditis if clinically indicated.  LEFT VENTRICLE PLAX 2D LVIDd:         5.00 cm  Diastology LVIDs:         3.10 cm  LV e' lateral: 14.10 cm/s LV PW:         1.10 cm  LV e' medial:  12.30 cm/s LV IVS:        0.80 cm LVOT diam:     2.40 cm LV SV:         80 ml LV SV Index:   35.02 LVOT Area:     4.52 cm  RIGHT VENTRICLE RV S prime:     27.10 cm/s TAPSE (M-mode): 2.8 cm LEFT ATRIUM           Index LA diam:      3.40 cm 1.50 cm/m LA Vol (A4C): 38.8 ml 17.08 ml/m  AORTIC VALVE LVOT Vmax:   105.00 cm/s LVOT Vmean:  74.200 cm/s LVOT VTI:    0.167 m  AORTA Ao Root diam: 3.60 cm  SHUNTS Systemic VTI:  0.17 m Systemic Diam: 2.40 cm  Cherlynn Kaiser MD Electronically signed by Cherlynn Kaiser MD Signature Date/Time: 04/19/2019/7:59:29 PM    Final (Updated)         Scheduled Meds: . Chlorhexidine Gluconate Cloth  6 each Topical Daily  . pantoprazole (PROTONIX) IV  40 mg Intravenous Q12H  . polyethylene glycol  17 g Oral Daily  . sodium chloride flush  3  mL Intravenous Q12H   Continuous Infusions: . sodium chloride 50 mL/hr at 04/16/19 1200  . sodium chloride 100 mL/hr at 04/18/19 2231  . sodium chloride 100 mL/hr at 04/20/19 1139  . acetaminophen Stopped (04/20/19 0844)  . ceFEPime (MAXIPIME) IV 2 g (04/20/19 0540)  . heparin 2,600 Units/hr (04/20/19 0546)  . metronidazole 500 mg (04/20/19 0842)  . vancomycin 1,250 mg (04/20/19 1130)     LOS: 9 days     Georgette Shell, MD [facility]  If 7PM-7AM, please contact night-coverage [email or web address] Password [email or web address] 04/20/2019, 1:02 PM

## 2019-04-20 NOTE — Progress Notes (Signed)
HEMATOLOGY-ONCOLOGY PROGRESS NOTE  SUBJECTIVE: since I saw him 2 days ago, he has developed multiple issues.  He has had intermittent fever in the past 2 to 3 days, on broad antibiotics, no fever today.  He also developed ileus, has been n.p.o., which has improved and he had multiple bowel movement today.  He did participate in physical therapy today, mainly at the bedside, he was quite fatigued after PT, and had some right-sided back pain.  His left leg edema and pain have much improved since the procedure.  Review of system otherwise negative.  I have reviewed the past medical history, past surgical history, social history and family history with the patient and they are unchanged from previous note.   PHYSICAL EXAMINATION:  Vitals:   04/20/19 1524 04/20/19 1900  BP: 137/79 (!) 150/83  Pulse: (!) 118 (!) 120  Resp: (!) 24 20  Temp: 99.3 F (37.4 C) (!) 100.4 F (38 C)  SpO2: 97% 96%   Filed Weights   04/10/19 2005 04/18/19 0600  Weight: 200 lb (90.7 kg) 216 lb 14.9 oz (98.4 kg)    Intake/Output from previous day: 02/03 0701 - 02/04 0700 In: 0  Out: 3100 [Urine:3100]  GENERAL:alert, no distress and comfortable LEGS: Mild left lower extremity edema, no tenderness, or skin erythema, no edema on right leg  Abdomen: soft, nontender   LABORATORY DATA:  I have reviewed the data as listed CMP Latest Ref Rng & Units 04/20/2019 04/19/2019 04/18/2019  Glucose 70 - 99 mg/dL 116(H) 107(H) 105(H)  BUN 6 - 20 mg/dL '8 6 6  ' Creatinine 0.61 - 1.24 mg/dL 1.01 0.95 0.91  Sodium 135 - 145 mmol/L 136 134(L) 132(L)  Potassium 3.5 - 5.1 mmol/L 3.4(L) 3.9 4.2  Chloride 98 - 111 mmol/L 106 102 99  CO2 22 - 32 mmol/L 19(L) 22 23  Calcium 8.9 - 10.3 mg/dL 7.6(L) 7.6(L) 7.7(L)  Total Protein 6.5 - 8.1 g/dL 6.1(L) 6.1(L) 5.5(L)  Total Bilirubin 0.3 - 1.2 mg/dL 1.2 1.0 1.0  Alkaline Phos 38 - 126 U/L 78 84 89  AST 15 - 41 U/L 33 30 30  ALT 0 - 44 U/L '26 24 19    ' Lab Results  Component Value Date    WBC 8.4 04/20/2019   HGB 7.3 (L) 04/20/2019   HCT 22.6 (L) 04/20/2019   MCV 91.5 04/20/2019   PLT 398 04/20/2019   NEUTROABS 6.3 04/20/2019    DG Abd 1 View  Result Date: 04/20/2019 CLINICAL DATA:  Diffuse abdominal tenderness, abdominal distension EXAM: ABDOMEN - 1 VIEW COMPARISON:  04/19/2019 FINDINGS: 2 supine frontal views of the abdomen and pelvis demonstrate persistent diffuse gaseous distention of the colon likely postoperative ileus. No masses or abnormal calcifications. Venous stent again noted. IMPRESSION: 1. Continued postoperative ileus. Electronically Signed   By: Randa Ngo M.D.   On: 04/20/2019 15:10   DG Abd 1 View  Result Date: 04/19/2019 CLINICAL DATA:  Evaluate ileus. EXAM: ABDOMEN - 1 VIEW COMPARISON:  April 19, 2019 (acquired at 8:04 a.m. FINDINGS: Numerous dilated air-filled loops of large bowel are again seen. This is unchanged in appearance when compared to the prior study. The visualized small bowel loops are normal in caliber. A radiopaque stent is seen overlying the medial aspect of the mid right abdomen. No radio-opaque calculi or other significant radiographic abnormality are seen. IMPRESSION: 1. Stable findings likely consistent with a postoperative ileus, unchanged in appearance when compared to the prior study dated April 19, 2019 (acquired at  8:04 a.m.). Electronically Signed   By: Virgina Norfolk M.D.   On: 04/19/2019 20:28   DG Abd 1 View  Result Date: 04/19/2019 CLINICAL DATA:  Generalized abdominal pain, postoperative ileus. EXAM: ABDOMEN - 1 VIEW COMPARISON:  April 18, 2019. FINDINGS: Stable dilated air-filled colon is noted most consistent with postoperative ileus. No significant small bowel dilatation is noted. Phleboliths are noted in the pelvis. IVC stent is again noted. IMPRESSION: Stable dilated air-filled colon is noted most consistent with postoperative ileus. Electronically Signed   By: Marijo Conception M.D.   On: 04/19/2019 09:14   CT ANGIO  CHEST PE W OR WO CONTRAST  Result Date: 04/19/2019 CLINICAL DATA:  Hypoxia. EXAM: CT ANGIOGRAPHY CHEST WITH CONTRAST TECHNIQUE: Multidetector CT imaging of the chest was performed using the standard protocol during bolus administration of intravenous contrast. Multiplanar CT image reconstructions and MIPs were obtained to evaluate the vascular anatomy. CONTRAST:  136m OMNIPAQUE IOHEXOL 300 MG/ML SOLN, 878mOMNIPAQUE IOHEXOL 350 MG/ML SOLN COMPARISON:  04/11/2019 FINDINGS: Cardiovascular: Evaluation for acute pulmonary emboli is severely limited by respiratory motion artifact and suboptimal contrast bolus timing. Given these limitations, no large centrally located pulmonary embolism was detected.Detection of smaller pulmonary emboli is not possible on this exam. The main pulmonary artery is within normal limits for size. There is no CT evidence of acute right heart strain. The visualized aorta is normal. Heart size is normal, without pericardial effusion. Mediastinum/Nodes: --No mediastinal or hilar lymphadenopathy. --No axillary lymphadenopathy. --there is an enlarged left supraclavicular lymph node measuring 1.4 cm (axial series 5, image 5). --Normal thyroid gland. --The esophagus is unremarkable Lungs/Pleura: There is atelectasis at the lung bases bilaterally. There are new small bilateral pleural effusions, left greater than right. Upper Abdomen: No acute abnormality. Musculoskeletal: No chest wall abnormality. No acute or significant osseous findings. Review of the MIP images confirms the above findings. IMPRESSION: 1. Evaluation for acute pulmonary emboli is severely limited by respiratory motion artifact and suboptimal contrast bolus timing. Given these limitations, no large centrally located pulmonary embolism was detected. 2. New small bilateral pleural effusions, left greater than right. 3. Enlarged left supraclavicular lymph node measuring 1.4 cm. This may be malignant or reactive in etiology. It is  amenable to percutaneous biopsy as clinically indicated. Electronically Signed   By: ChConstance Holster.D.   On: 04/19/2019 23:17   CT Angio Chest PE W and/or Wo Contrast  Result Date: 04/11/2019 CLINICAL DATA:  Positive lower extremity DVT, PE suspected. Back pain and leg swelling, history of testicular cancer. EXAM: CT ANGIOGRAPHY CHEST CT ABDOMEN AND PELVIS WITH CONTRAST TECHNIQUE: Multidetector CT imaging of the chest was performed using the standard protocol during bolus administration of intravenous contrast. Multiplanar CT image reconstructions and MIPs were obtained to evaluate the vascular anatomy. Multidetector CT imaging of the abdomen and pelvis was performed using the standard protocol during bolus administration of intravenous contrast. CONTRAST:  10064mMNIPAQUE IOHEXOL 350 MG/ML SOLN COMPARISON:  None. FINDINGS: CTA CHEST FINDINGS Cardiovascular: Suboptimal opacification of the pulmonary arteries limits evaluation beyond the lobar level. No large central or lobar pulmonary arterial filling defects are identified. Central pulmonary arteries are normal caliber. Normal cardiac size. 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 The aorta is normal caliber. Shared origin of the brachiocephalic and left common carotid arteries. Great vessels are otherwise unremarkable. Mediastinum/Nodes: No enlarged mediastinal, hilar or axillary adenopathy is seen. No  acute abnormality of the trachea or esophagus. Thyroid gland is normal. Asymmetric thickening of the left sternocleidomastoid is likely related to muscle activation. Lungs/Pleura: Respiratory motion artifact is mild. No consolidation, features of edema, pneumothorax, or effusion. No suspicious pulmonary nodules or masses. Musculoskeletal: No acute osseous abnormality or suspicious osseous lesion in the imaged chest. No worrisome chest wall lesions. Mild bilateral gynecomastia is noted,  right slightly greater left. Review of the MIP images confirms the above findings. CT ABDOMEN and PELVIS FINDINGS Hepatobiliary: No focal liver abnormality is seen. No gallstones, gallbladder wall thickening, or biliary dilatation. Pancreas: Unremarkable. No pancreatic ductal dilatation or surrounding inflammatory changes. Spleen: Normal in size without focal abnormality. Adrenals/Urinary Tract: Adrenal glands are unremarkable. Kidneys are normal, without renal calculi, focal lesion, or hydronephrosis. Bladder is unremarkable. Stomach/Bowel: Distal esophagus, stomach and duodenal sweep are unremarkable. No small bowel wall thickening or dilatation. No evidence of obstruction. A normal appendix is visualized. No colonic dilatation or wall thickening. Scattered colonic diverticula without focal pericolonic inflammation to suggest diverticulitis. Vascular/Lymphatic: There is extensive retroperitoneal and pelvic adenopathy, some of the larger nodes include a left periaortic node measuring up to 3.2 cm (2/43) and a 2 cm aortocaval lymph node (2/35) there is distal abdominal aortic encasement by a extensive retroperitoneal adenopathy. Furthermore there is likely direct compression and/or direct nodal involvement of the inferior vena cava (2/40 with the lower IVC appearing expanded by hypoattenuating filling defects. Edematous changes are noted throughout the pelvis with venous engorgement and collateralization. Reproductive: The prostate and seminal vesicles are unremarkable. There is a varicocele seen in the left scrotum. External genitalia are incompletely included on this exam. Other: There is circumferential body wall edema from the level of the pelvis inferiorly more pronounced throughout the left lower extremity. Free fluid is seen in the deep pelvis, likely reactive or secondary to venous occlusion with developing collateralization. Musculoskeletal: No acute osseous abnormality or suspicious osseous lesion. Normal  symmetric muscular enhancement. Review of the MIP images confirms the above findings. IMPRESSION: 1. Suboptimal opacification of the pulmonary arteries limits evaluation beyond the lobar level. No large central or lobar pulmonary arterial filling defects are identified. 2. Extensive retroperitoneal and pelvic adenopathy. There is likely direct compression of the lower IVC with the distal IVC and iliac veins appearing expanded by hypoattenuating thrombus. There is complete encasement of the abdominal aorta as well but without luminal narrowing. Findings are highly concerning for metastatic disease, particularly given patient history of testicular cancer. 3. Left scrotal varicocele. External genitalia are incompletely included on this exam. Consider further evaluation scrotal ultrasound given extensive retroperitoneal adenopathy and patient's history of testicular cancer. 4. Free fluid in the deep pelvis is likely reactive or secondary to venous occlusion with developing collateralization. 5. Colonic diverticulosis without evidence of diverticulitis. 6. Mild bilateral gynecomastia, right slightly greater left. 7. 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 Critical Value/emergent results were called by telephone at the time of interpretation on 04/11/2019 at 1:33 am to [place] , who verbally acknowledged these results. Electronically Signed   By: Lovena Le M.D.   On: 04/11/2019 01:33   MR BRAIN W WO CONTRAST  Result Date: 04/15/2019 CLINICAL DATA:  History of testicular cancer. Assess for metastatic disease. EXAM: MRI HEAD WITHOUT AND WITH CONTRAST TECHNIQUE: Multiplanar, multiecho pulse sequences of the brain and surrounding structures were obtained without and with intravenous contrast. CONTRAST:  40m GADAVIST GADOBUTROL  1 MMOL/ML IV SOLN COMPARISON:  None. FINDINGS: Brain: The brain has a normal appearance without evidence of  malformation, atrophy, old or acute small or large vessel infarction, mass lesion, hemorrhage, hydrocephalus or extra-axial collection. After contrast administration, no abnormal enhancement occurs. Vascular: Major vessels at the base of the brain show flow. Venous sinuses appear patent. Skull and upper cervical spine: Normal. Sinuses/Orbits: Clear/normal. Other: None significant. IMPRESSION: Normal examination.  No evidence of metastatic disease. Electronically Signed   By: Nelson Chimes M.D.   On: 04/15/2019 11:07   US Scrotum  Result Date: 04/11/2019 CLINICAL DATA:  Concern for testicular cancer EXAM: ULTRASOUND OF SCROTUM TECHNIQUE: Complete ultrasound examination of the testicles, epididymis, and other scrotal structures was performed. COMPARISON:  None. FINDINGS: Right testicle Measurements: Surgically removed. Left testicle Measurements: 3.9 x 2.2 x 2.6 cm. No mass or microlithiasis visualized. Right epididymis:  Not visualized Left epididymis:  Normal in size and appearance. Hydrocele:  Moderate left hydrocele Varicocele:  Moderate left varicocele. IMPRESSION: No left testicular abnormality.  Prior right orchiectomy. Moderate left hydrocele and varicocele. Electronically Signed   By: Rolm Baptise M.D.   On: 04/11/2019 02:16   CT ABDOMEN PELVIS W CONTRAST  Result Date: 04/18/2019 CLINICAL DATA:  Testicular carcinoma post RIGHT orchiectomy, LEFT lower extremity DVT, abdominal distension, recent CT with intra-abdominal and retroperitoneal lymphadenopathy concerning for metastatic cancer EXAM: CT ABDOMEN AND PELVIS WITH CONTRAST TECHNIQUE: Multidetector CT imaging of the abdomen and pelvis was performed using the standard protocol following bolus administration of intravenous contrast. Sagittal and coronal MPR images reconstructed from axial data set. CONTRAST:  176m OMNIPAQUE IOHEXOL 300 MG/ML SOLN IV. No oral contrast. COMPARISON:  04/11/2019 FINDINGS: Lower chest: Dependent bibasilar atelectasis in the  lower lobes Hepatobiliary: New dependent density within gallbladder question vicarious excretion of contrast material from prior CT exam versus sludge. No gallbladder wall thickening. Liver unremarkable. Pancreas: Normal appearance Spleen: Normal appearance Adrenals/Urinary Tract: Adrenal glands, kidneys, and ureters normal appearance. Foley catheter and air within urinary bladder. Stomach/Bowel: Retrocecal appendix, normal. Gaseous distention of colon from cecum to descending colon. Small amount of stool in fluid within proximal colon. Tapering of the distal colon with few scattered distal colonic diverticula but no evidence of diverticulitis. Rectosigmoid colon decompressed. No discrete point of obstruction seen; findings favor ileus. Stomach and small bowel loops unremarkable Vascular/Lymphatic: Prior stenting of the IVC. Aorta normal caliber without aneurysm. IVC, iliac veins, common femoral veins patent without thrombus. Previously identified thrombus LEFT external iliac vein no longer seen. Again identified significant retroperitoneal adenopathy unchanged. Reproductive: Seminal vesicles unremarkable. Minimal prostatic prominence for age. Other: Scattered stranding of retroperitoneal tissue planes increased from previous exam. No free air or free fluid. Tiny LEFT inguinal hernia containing fat Musculoskeletal: Osseous structures unremarkable. New enlargement of the LEFT psoas muscle with in ill-defined area of central low attenuation approximately 3.7 x 3.9 cm in axial dimensions extending 12 cm length, could represent LEFT psoas abscess or hematoma. This is new since 04/11/2019 making tumor infiltration unlikely. IMPRESSION: Mild gaseous distention of the colon through descending colon with decompressed rectosigmoid colon; no point of obstruction is identified, favor ileus. Persistent retroperitoneal adenopathy suspicious for metastatic disease. New ill-defined fluid collection within LEFT psoas muscle 3.7 x  3.9 x 12 cm question psoas abscess versus hematoma; LEFT psoas muscle was traversed by prior CT guided retroperitoneal lymph node biopsy. Increased retroperitoneal and mesenteric stranding. Bibasilar atelectasis. Findings called to Dr. MZigmund Danielon 04/18/2019 at 1108 hours. Electronically Signed   By:  Lavonia Dana M.D.   On: 04/18/2019 11:08   CT ABDOMEN PELVIS W CONTRAST  Result Date: 04/11/2019 CLINICAL DATA:  Positive lower extremity DVT, PE suspected. Back pain and leg swelling, history of testicular cancer. EXAM: CT ANGIOGRAPHY CHEST CT ABDOMEN AND PELVIS WITH CONTRAST TECHNIQUE: Multidetector CT imaging of the chest was performed using the standard protocol during bolus administration of intravenous contrast. Multiplanar CT image reconstructions and MIPs were obtained to evaluate the vascular anatomy. Multidetector CT imaging of the abdomen and pelvis was performed using the standard protocol during bolus administration of intravenous contrast. CONTRAST:  126m OMNIPAQUE IOHEXOL 350 MG/ML SOLN COMPARISON:  None. FINDINGS: CTA CHEST FINDINGS Cardiovascular: Suboptimal opacification of the pulmonary arteries limits evaluation beyond the lobar level. No large central or lobar pulmonary arterial filling defects are identified. Central pulmonary arteries are normal caliber. Normal cardiac size. 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 The aorta is normal caliber. Shared origin of the brachiocephalic and left common carotid arteries. Great vessels are otherwise unremarkable. Mediastinum/Nodes: No enlarged mediastinal, hilar or axillary adenopathy is seen. No acute abnormality of the trachea or esophagus. Thyroid gland is normal. Asymmetric thickening of the left sternocleidomastoid is likely related to muscle activation. Lungs/Pleura: Respiratory motion artifact is mild. No consolidation, features of edema, pneumothorax, or effusion. No  suspicious pulmonary nodules or masses. Musculoskeletal: No acute osseous abnormality or suspicious osseous lesion in the imaged chest. No worrisome chest wall lesions. Mild bilateral gynecomastia is noted, right slightly greater left. Review of the MIP images confirms the above findings. CT ABDOMEN and PELVIS FINDINGS Hepatobiliary: No focal liver abnormality is seen. No gallstones, gallbladder wall thickening, or biliary dilatation. Pancreas: Unremarkable. No pancreatic ductal dilatation or surrounding inflammatory changes. Spleen: Normal in size without focal abnormality. Adrenals/Urinary Tract: Adrenal glands are unremarkable. Kidneys are normal, without renal calculi, focal lesion, or hydronephrosis. Bladder is unremarkable. Stomach/Bowel: Distal esophagus, stomach and duodenal sweep are unremarkable. No small bowel wall thickening or dilatation. No evidence of obstruction. A normal appendix is visualized. No colonic dilatation or wall thickening. Scattered colonic diverticula without focal pericolonic inflammation to suggest diverticulitis. Vascular/Lymphatic: There is extensive retroperitoneal and pelvic adenopathy, some of the larger nodes include a left periaortic node measuring up to 3.2 cm (2/43) and a 2 cm aortocaval lymph node (2/35) there is distal abdominal aortic encasement by a extensive retroperitoneal adenopathy. Furthermore there is likely direct compression and/or direct nodal involvement of the inferior vena cava (2/40 with the lower IVC appearing expanded by hypoattenuating filling defects. Edematous changes are noted throughout the pelvis with venous engorgement and collateralization. Reproductive: The prostate and seminal vesicles are unremarkable. There is a varicocele seen in the left scrotum. External genitalia are incompletely included on this exam. Other: There is circumferential body wall edema from the level of the pelvis inferiorly more pronounced throughout the left lower extremity.  Free fluid is seen in the deep pelvis, likely reactive or secondary to venous occlusion with developing collateralization. Musculoskeletal: No acute osseous abnormality or suspicious osseous lesion. Normal symmetric muscular enhancement. Review of the MIP images confirms the above findings. IMPRESSION: 1. Suboptimal opacification of the pulmonary arteries limits evaluation beyond the lobar level. No large central or lobar pulmonary arterial filling defects are identified. 2. Extensive retroperitoneal and pelvic adenopathy. There is likely direct compression of the lower IVC with the distal IVC and iliac veins appearing expanded by hypoattenuating thrombus. There is  complete encasement of the abdominal aorta as well but without luminal narrowing. Findings are highly concerning for metastatic disease, particularly given patient history of testicular cancer. 3. Left scrotal varicocele. External genitalia are incompletely included on this exam. Consider further evaluation scrotal ultrasound given extensive retroperitoneal adenopathy and patient's history of testicular cancer. 4. Free fluid in the deep pelvis is likely reactive or secondary to venous occlusion with developing collateralization. 5. Colonic diverticulosis without evidence of diverticulitis. 6. Mild bilateral gynecomastia, right slightly greater left. 7. 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 Critical Value/emergent results were called by telephone at the time of interpretation on 04/11/2019 at 1:33 am to [place] , who verbally acknowledged these results. Electronically Signed   By: Lovena Le M.D.   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  Patient has experienced minimal improvement since initiation of IV heparin, and as such request made for initiation of catheter directed thrombolysis for symptomatic purposes. EXAM: 1. ULTRASOUND GUIDANCE FOR VASCULAR ACCESS X2 2. FLUOROSCOPIC GUIDED PHARMACOLOGICAL AND MECHANICAL THROMBECTOMY WITH ANGIOJET DEVICE 3. BILATERAL LOWER EXTREMITY VENOGRAM AND PLACEMENT OF INFUSION CATHETERS COMPARISON:  CT chest, abdomen pelvis-04/11/2019; abdominal MRV - 04/13/2019 MEDICATIONS: Hydralazine 10 mg IV, administered near the completion of the procedure secondary to persistently elevated blood pressure. CONTRAST:  60 cc Omnipaque 300 ANESTHESIA/SEDATION: Moderate (conscious) sedation was employed during this procedure. A total of Versed 3 mg and Fentanyl 125 mcg was administered intravenously. Moderate Sedation Time: 67 minutes. The patient's level of consciousness and vital signs were monitored continuously by radiology nursing throughout the procedure under my direct supervision. FLUOROSCOPY TIME:  18 minutes, 18 seconds (518 mGy) COMPLICATIONS: None immediate. TECHNIQUE: Informed written consent was obtained from the patient after a discussion of the risks (including but not limited to bleeding, including nontarget bleeding given history of malignancy, infection, pulmonary embolism and necessity for venous angioplasty and/or stent placement), benefits and alternatives to treatment. Questions regarding the procedure were encouraged and answered. A timeout was performed prior to the initiation of the procedure. The patient was placed prone on the fluoroscopy table and the skin posterior to the bilateral knees was prepped and draped in the usual sterile fashion, and a sterile drape was applied covering the operative field. Maximum barrier sterile technique with sterile gowns and gloves were used for the procedure. Under direct ultrasound guidance, the left popliteal vein was access with a micropuncture kit after the  overlying soft tissues were anesthetized with 1% lidocaine. An ultrasound image was saved for documentation purposed. This allowed for placement of a 8-French vascular sheath. A limited venogram was performed through the side arm of the vascular sheath. With the use of a regular glidewire, a Kumpe catheter was advanced through the left femoral, external iliac vein and through the left common iliac vein to the level of the IVC. Limited vena cavogram demonstrates complete occlusion of the distal aspect of the IVC extending to involve both left common iliac veins. Ultimately, a moderate to long segment apparently extrinsic malignant narrowing involving the mid/distal aspect of the IVC was successfully traversed with inferior venogram demonstrating patency of the more cranial infrarenal IVC. Next, with the use of a Angiojet device, approximately 250 cc of a solution of 10 mg  of tPA mixed in 500 mg of saline was infused throughout the occluded segment of the IVC, left common and external iliac veins to the level of the left common femoral vein. Attention was now paid towards access of the right lower extremity popliteal vein. After the overlying soft tissues were anesthetized with 1% lidocaine, the right popliteal vein was accessed micropuncture kit. Limited venogram was performed through the outer micro puncture sheath. This allowed for placement of a 6 French, 35 cm vascular sheath. Again, with the use of a regular glidewire, a Kumpe catheter was advanced beyond the obstructing caval lesion. Contrast injection confirmed appropriate positioning. Next, after approximately 20 minute tPA dwell, mechanical thrombectomy was performed with the Angiojet device both from the left lower extremity access as well as the right lower extremity access. Limited vena cavagram was performed via the right lower extremity access. Kumpe catheter was utilized for measurement purposes and ultimately a 90 cm/20-cm infusion catheter was placed  from the right lower extremity access and a 90 cm/30 cm infusion catheter was placed from the left lower extremity access adequately covering the occluded segment of the bilateral venous systems as well as the caudal aspect of the IVC. Postprocedural spot fluoroscopic images were obtained. Both popliteal vascular sheath were secured in place. Dressings were applied. Patient's was subjectively cold by the end of the procedure with associated elevation in his blood pressure and as such he was administered 10 mg of hydralazine and bear hugger warming device was applied. The patient otherwise tolerated the procedure well without immediate postprocedural complication. FINDINGS: Sonographic evaluation demonstrates patency of the bilateral popliteal veins. Contrast injection demonstrates patency of the bilateral popliteal and superficial femoral veins. There is occlusive thrombus extending from the left common femoral vein and the right common iliac vein through the mid/caudal aspect of the IVC secondary to a moderate to long segment length presumably extrinsic malignant occlusion secondary to known adjacent bulky retroperitoneal lymphadenopathy. Following pharmacological and mechanical thrombectomy as detailed above, there is restored flow as demonstrated on limited right central pelvic venogram with large amount of residual thrombus. Following infusion catheter placement, both side ports traverse both occluded segments of the IVC and pelvic venous systems. IMPRESSION: 1. 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 2. Successful initiation of bilateral mechanical and pharmacologic thrombectomy with bilateral infusion catheters appropriately positioned. PLAN: The patient will be transferred to ICU for overnight lytic infusion and will return tomorrow for repeat venogram and potential intervention.  Electronically Signed   By: Sandi Mariscal M.D.   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 Patient has experienced minimal improvement since initiation of IV heparin, and as such request made for initiation of catheter directed thrombolysis for symptomatic purposes. EXAM: 1. ULTRASOUND GUIDANCE FOR VASCULAR ACCESS X2 2. FLUOROSCOPIC GUIDED PHARMACOLOGICAL AND MECHANICAL THROMBECTOMY WITH ANGIOJET DEVICE 3. BILATERAL LOWER EXTREMITY VENOGRAM AND PLACEMENT OF INFUSION CATHETERS COMPARISON:  CT chest, abdomen pelvis-04/11/2019; abdominal MRV - 04/13/2019 MEDICATIONS: Hydralazine 10 mg IV, administered near the completion of the procedure secondary to persistently elevated blood pressure. CONTRAST:  60 cc Omnipaque 300 ANESTHESIA/SEDATION: Moderate (conscious) sedation was employed during this procedure. A total of Versed 3 mg and Fentanyl 125 mcg was administered intravenously.  Moderate Sedation Time: 67 minutes. The patient's level of consciousness and vital signs were monitored continuously by radiology nursing throughout the procedure under my direct supervision. FLUOROSCOPY TIME:  18 minutes, 18 seconds (563 mGy) COMPLICATIONS: None immediate. TECHNIQUE: Informed written consent was obtained from the patient after a discussion of the risks (including but not limited to bleeding, including nontarget bleeding given history of malignancy, infection, pulmonary embolism and necessity for venous angioplasty and/or stent placement), benefits and alternatives to treatment. Questions regarding the procedure were encouraged and answered. A timeout was performed prior to the initiation of the procedure. The patient was placed prone on the fluoroscopy table and the skin posterior to the  bilateral knees was prepped and draped in the usual sterile fashion, and a sterile drape was applied covering the operative field. Maximum barrier sterile technique with sterile gowns and gloves were used for the procedure. Under direct ultrasound guidance, the left popliteal vein was access with a micropuncture kit after the overlying soft tissues were anesthetized with 1% lidocaine. An ultrasound image was saved for documentation purposed. This allowed for placement of a 8-French vascular sheath. A limited venogram was performed through the side arm of the vascular sheath. With the use of a regular glidewire, a Kumpe catheter was advanced through the left femoral, external iliac vein and through the left common iliac vein to the level of the IVC. Limited vena cavogram demonstrates complete occlusion of the distal aspect of the IVC extending to involve both left common iliac veins. Ultimately, a moderate to long segment apparently extrinsic malignant narrowing involving the mid/distal aspect of the IVC was successfully traversed with inferior venogram demonstrating patency of the more cranial infrarenal IVC. Next, with the use of a Angiojet device, approximately 250 cc of a solution of 10 mg of tPA mixed in 500 mg of saline was infused throughout the occluded segment of the IVC, left common and external iliac veins to the level of the left common femoral vein. Attention was now paid towards access of the right lower extremity popliteal vein. After the overlying soft tissues were anesthetized with 1% lidocaine, the right popliteal vein was accessed micropuncture kit. Limited venogram was performed through the outer micro puncture sheath. This allowed for placement of a 6 French, 35 cm vascular sheath. Again, with the use of a regular glidewire, a Kumpe catheter was advanced beyond the obstructing caval lesion. Contrast injection confirmed appropriate positioning. Next, after approximately 20 minute tPA dwell,  mechanical thrombectomy was performed with the Angiojet device both from the left lower extremity access as well as the right lower extremity access. Limited vena cavagram was performed via the right lower extremity access. Kumpe catheter was utilized for measurement purposes and ultimately a 90 cm/20-cm infusion catheter was placed from the right lower extremity access and a 90 cm/30 cm infusion catheter was placed from the left lower extremity access adequately covering the occluded segment of the bilateral venous systems as well as the caudal aspect of the IVC. Postprocedural spot fluoroscopic images were obtained. Both popliteal vascular sheath were secured in place. Dressings were applied. Patient's was subjectively cold by the end of the procedure with associated elevation in his blood pressure and as such he was administered 10 mg of hydralazine and bear hugger warming device was applied. The patient otherwise tolerated the procedure well without immediate postprocedural complication. FINDINGS: Sonographic evaluation demonstrates patency of the bilateral popliteal veins. Contrast injection demonstrates patency of the bilateral popliteal and superficial femoral veins. There is occlusive  thrombus extending from the left common femoral vein and the right common iliac vein through the mid/caudal aspect of the IVC secondary to a moderate to long segment length presumably extrinsic malignant occlusion secondary to known adjacent bulky retroperitoneal lymphadenopathy. Following pharmacological and mechanical thrombectomy as detailed above, there is restored flow as demonstrated on limited right central pelvic venogram with large amount of residual thrombus. Following infusion catheter placement, both side ports traverse both occluded segments of the IVC and pelvic venous systems. IMPRESSION: 1. 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 2. Successful initiation of bilateral mechanical and pharmacologic thrombectomy with bilateral infusion catheters appropriately positioned. PLAN: The patient will be transferred to ICU for overnight lytic infusion and will return tomorrow for repeat venogram and potential intervention. Electronically Signed   By: Sandi Mariscal M.D.   On: 04/15/2019 17:04   US RENAL  Result Date: 04/17/2019 CLINICAL DATA:  Urinary retention.  History of testicular carcinoma. EXAM: RENAL / URINARY TRACT ULTRASOUND COMPLETE COMPARISON:  CT scan 04/11/2019 FINDINGS: Right Kidney: Renal measurements: 13.5 x 5.5 x 6.6 cm = volume: 260.0 mL . Normal renal cortical thickness and echogenicity without focal lesions or hydronephrosis. Left Kidney: Renal measurements: 12.0 x 6.8 x 5.4 cm = volume: 229.4 mL. Normal renal cortical thickness and echogenicity without focal lesions or hydronephrosis. Bladder: Decompressed by Foley catheter. Other: None. IMPRESSION: Normal sonographic appearance of both kidneys.  No hydronephrosis. Bladder decompressed by a Foley catheter. Electronically Signed   By: Marijo Sanes M.D.   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  Patient has experienced minimal improvement since initiation of IV heparin, and as such request made for initiation of catheter directed thrombolysis for symptomatic purposes.  EXAM: 1. ULTRASOUND GUIDANCE FOR VASCULAR ACCESS X2 2. FLUOROSCOPIC GUIDED PHARMACOLOGICAL AND MECHANICAL THROMBECTOMY WITH ANGIOJET DEVICE 3. BILATERAL LOWER EXTREMITY VENOGRAM AND PLACEMENT OF INFUSION CATHETERS  COMPARISON:  CT chest, abdomen pelvis-04/11/2019; abdominal MRV - 04/13/2019  MEDICATIONS: Hydralazine 10 mg IV,  administered near the completion of the procedure secondary to persistently elevated blood pressure.  CONTRAST:  60 cc Omnipaque 300  ANESTHESIA/SEDATION: Moderate (conscious) sedation was employed during this procedure. A total of Versed 3 mg and Fentanyl 125 mcg was administered intravenously.  Moderate Sedation Time: 67 minutes. The patient's level of consciousness and vital signs were monitored continuously by radiology nursing throughout the procedure under my direct supervision.  FLUOROSCOPY TIME:  18 minutes, 18 seconds (169 mGy)  COMPLICATIONS: None immediate.  TECHNIQUE: Informed written consent was obtained from the patient after a discussion of the risks (including but not limited to bleeding, including nontarget bleeding given history of malignancy, infection, pulmonary embolism and necessity for venous angioplasty and/or stent placement), benefits and alternatives to treatment. Questions regarding the procedure were encouraged and answered. A timeout was performed prior to the initiation of the procedure.  The patient was placed prone on the fluoroscopy table and the skin posterior to the bilateral knees was prepped and draped in the usual sterile fashion, and a sterile drape was applied covering the operative field. Maximum barrier sterile technique with sterile gowns and gloves were used for the procedure.  Under direct ultrasound guidance, the left popliteal  vein was access with a micropuncture kit after the overlying soft tissues were anesthetized with 1% lidocaine. An ultrasound image was saved for documentation purposed. This allowed for placement of a 8-French vascular sheath. A limited venogram was performed through the side arm of the vascular sheath.  With the use of a regular glidewire, a Kumpe catheter was advanced through the left femoral, external iliac vein and through the left common iliac vein to the level of the IVC.  Limited vena cavogram demonstrates complete occlusion of  the distal aspect of the IVC extending to involve both left common iliac veins.  Ultimately, a moderate to long segment apparently extrinsic malignant narrowing involving the mid/distal aspect of the IVC was successfully traversed with inferior venogram demonstrating patency of the more cranial infrarenal IVC.  Next, with the use of a Angiojet device, approximately 250 cc of a solution of 10 mg of tPA mixed in 500 mg of saline was infused throughout the occluded segment of the IVC, left common and external iliac veins to the level of the left common femoral vein.  Attention was now paid towards access of the right lower extremity popliteal vein.  After the overlying soft tissues were anesthetized with 1% lidocaine, the right popliteal vein was accessed micropuncture kit. Limited venogram was performed through the outer micro puncture sheath. This allowed for placement of a 6 French, 35 cm vascular sheath.  Again, with the use of a regular glidewire, a Kumpe catheter was advanced beyond the obstructing caval lesion. Contrast injection confirmed appropriate positioning.  Next, after approximately 20 minute tPA dwell, mechanical thrombectomy was performed with the Angiojet device both from the left lower extremity access as well as the right lower extremity access.  Limited vena cavagram was performed via the right lower extremity access.  Kumpe catheter was utilized for measurement purposes and ultimately a 90 cm/20-cm infusion catheter was placed from the right lower extremity access and a 90 cm/30 cm infusion catheter was placed from the left lower extremity access adequately covering the occluded segment of the bilateral venous systems as well as the caudal aspect of the IVC.  Postprocedural spot fluoroscopic images were obtained.  Both popliteal vascular sheath were secured in place. Dressings were applied.  Patient's was subjectively cold by the end of the procedure with associated elevation in his  blood pressure and as such he was administered 10 mg of hydralazine and bear hugger warming device was applied.  The patient otherwise tolerated the procedure well without immediate postprocedural complication.  FINDINGS: Sonographic evaluation demonstrates patency of the bilateral popliteal veins.  Contrast injection demonstrates patency of the bilateral popliteal and superficial femoral veins.  There is occlusive thrombus extending from the left common femoral vein and the right common iliac vein through the mid/caudal aspect of the IVC secondary to a moderate to long segment length presumably extrinsic malignant occlusion secondary to known adjacent bulky retroperitoneal lymphadenopathy.  Following pharmacological and mechanical thrombectomy as detailed above, there is restored flow as demonstrated on limited right central pelvic venogram with large amount of residual thrombus.  Following infusion catheter placement, both side ports traverse both occluded segments of the IVC and pelvic venous systems.  IMPRESSION: 1. 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 2. Successful initiation of bilateral mechanical and pharmacologic thrombectomy with bilateral infusion catheters appropriately positioned.  PLAN: The patient will be transferred to ICU for overnight lytic  infusion and will return tomorrow for repeat venogram and potential intervention.   Electronically Signed   By: Sandi Mariscal M.D.   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  Patient was initiated bilateral pelvic/IVC catheter directed thrombolysis day prior (04/15/2019), and returns  today following 24 hours of tPA infusion.  EXAM: 1. IR THROMB F/U EVAL ART/VEN FINAL DAY 2. FLUOROSCOPIC GUIDED ANGIOPLASTY AND STENT PLACEMENT OF THE IVC  COMPARISON:  Initiation of bilateral catheter directed pelvic venous and IVC thrombolysis - 04/15/2019; CT abdomen pelvis - 04/11/2019  MEDICATIONS: Heparin 7000 units  CONTRAST:  85 cc Omnipaque 300  ANESTHESIA/SEDATION: Moderate (conscious) sedation was employed during this procedure. A total of Versed 2 mg and Dilaudid 3 mg was administered intravenously.  Moderate Sedation Time: 128 minutes. The patient's level of consciousness and vital signs were monitored continuously by radiology nursing throughout the procedure under my direct supervision.  FLUOROSCOPY TIME:  14 minutes, 12 seconds (833.8 mGy)  COMPLICATIONS: None immediate.  TECHNIQUE: Informed written consent was obtained from the patient after a discussion of the risks, benefits and alternatives to treatment. Questions regarding the procedure were encouraged and answered. A timeout was performed prior to the initiation of the procedure.  The patient was placed prone on the fluoroscopy table and the skin posterior to the bilateral knees as well as the external portion the existing bilateral vascular sheaths and infusion catheters was prepped and draped in usual sterile fashion. The surrounding subcutaneous tissues about both popliteal approach vascular sheath was anesthetized with 1% lidocaine with epinephrine.  Next, post lysis venograms were performed from the bilateral infusion catheters following removal of the bilateral infusion wires.  Images were reviewed and the decision was made to proceed with additional mechanical thrombectomy.  With the use of a vertebral catheter exchange length stiff Glidewires were advanced from both existing popliteal vascular sheaths superior to the nearly obstructing caval lesion. Contrast injection confirmed appropriate positioning. Stiff glide wires  were advanced into the left subclavian vein.  4000 units of heparin was administered intravenously.  Next, beginning with the left lower extremity, the existing 8 French vascular sheath was exchanged over the glidewire for a 13 [place] vascular sheath.  The 16 mm diameter [place]ottre[place]ver Inari mechanical thrombectomy device was then utilized to perform 3 passes from the left popliteal approach from at and just below level of the obstructing caval lesion. Post thrombectomy images were obtained from the left lower extremity.  Next, the existing right lower extremity 6 French popliteal vein vascular sheath was exchanged over a guidewire for a 13 French vascular sheath. Again, from the right lower extremity approach, the 16 mm diameter Cl[place]re[place]ver Inari mechanical thrombectomy device was then utilized to perform 3 passes from the level at and just below the obstructing caval lesion. Post thrombectomy images were obtained from the right lower extremity.  An ACT level was obtained and an additional 3000 units of heparin was administered intravenously.  The long segment eccentric irregular narrowing/subtotal occlusion of the IVC was subsequently balloon angioplastied at multiple stations with a 14 mm x 4 cm Atlas balloon. Despite adequate balloon apposition, post angioplasty venogram images demonstrate a recurrent hemodynamically significant narrowing.  As such, a 20 mm x 60 mm Venovo venous stent was deployed across the superior and mid aspect of  the caval lesion and was subsequently balloon angioplastied at multiple stations initially with a 16 mm x 4 cm atlas balloon and ultimately with an 18 mm x 4 cm balloon.  Post stent deployment venogram images were obtained however there is felt to be incomplete coverage of the distal aspect of the malignant narrowing. As such, an additional 20 mm x 40 mm Venovo venous stent was deployed overlapping the mid/caudal aspect of venous stent and adequately covering the caudal  aspect caval lesion.  Both overlapping venous stents were subsequently balloon angioplastied at multiple stations with the 18 mm atlas balloon and completion venogram images were obtained from both lower extremities. Images were reviewed and the procedure was terminated.  All wires, catheters and sheaths were removed from the bilateral popliteal vein accesses and hemostasis was achieved with manual compression. Dressings applied. The patient tolerated the procedure well without immediate postprocedural complication.  FINDINGS: Initial post lysis venogram performed from the left lower extremity infusion catheter demonstrates restoration of flow through the left pelvis with moderate amount residual thrombus primarily within the peripheral aspect of the left external iliac vein as well as the left common and central aspect of the left superficial femoral veins. There is passage of contrast beyond the nearly obstructing lesion involving mid/caudal aspect of the IVC.  Initial post lysis venogram performed from the right lower extremity demonstrates persistent occlusion at the level of the right common iliac vein.  Following 3 passes from each lower extremity with the 16 mm diameter Clottreiever Inari mechanical thrombectomy device, nearly all residual clot burden within the pelvis was removed.  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 This lesion was again noted to be significantly caudal to the confluence of the bilateral renal veins.  As such, nearly obstructing caval lesion was treated with overlapping stent deployment of 20 mm diameter Venovo venous stents which were subsequently balloon angioplastied to 18 mm diameter.  Completion images demonstrate a technically  excellent result with restored brisk flow through the bilateral pelvic systems and the IVC.  There is a mild residual narrowing at the level of the mid/caudal aspect of the IVC, though not felt to result in a residual hemodynamically significant stenosis.  No evidence of complication. Specifically, no evidence of contrast extravasation or vessel dissection.  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  PLAN: - Maintain IV heparin drip until patient is successfully converted to long-term anticoagulant.  - Recommend maintaining SCD devices while in bed.   Electronically Signed   By: Sandi Mariscal M.D.   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 Patient has experienced minimal improvement since initiation of IV heparin, and as such request made for initiation of catheter directed thrombolysis for symptomatic purposes. EXAM: 1. ULTRASOUND GUIDANCE FOR VASCULAR ACCESS X2 2. FLUOROSCOPIC GUIDED PHARMACOLOGICAL AND MECHANICAL THROMBECTOMY WITH ANGIOJET DEVICE 3. BILATERAL LOWER EXTREMITY VENOGRAM AND PLACEMENT OF INFUSION CATHETERS COMPARISON:  CT chest, abdomen pelvis-04/11/2019; abdominal MRV - 04/13/2019 MEDICATIONS: Hydralazine 10 mg IV, administered near the completion of the procedure secondary to persistently elevated  blood pressure. CONTRAST:  60 cc Omnipaque 300 ANESTHESIA/SEDATION: Moderate (conscious) sedation was employed during this procedure. A total of Versed 3 mg and Fentanyl 125 mcg was administered intravenously. Moderate Sedation Time: 67 minutes. The patient's level of consciousness and  vital signs were monitored continuously by radiology nursing throughout the procedure under my direct supervision. FLUOROSCOPY TIME:  18 minutes, 18 seconds (563 mGy) COMPLICATIONS: None immediate. TECHNIQUE: Informed written consent was obtained from the patient after a discussion of the risks (including but not limited to bleeding, including nontarget bleeding given history of malignancy, infection, pulmonary embolism and necessity for venous angioplasty and/or stent placement), benefits and alternatives to treatment. Questions regarding the procedure were encouraged and answered. A timeout was performed prior to the initiation of the procedure. The patient was placed prone on the fluoroscopy table and the skin posterior to the bilateral knees was prepped and draped in the usual sterile fashion, and a sterile drape was applied covering the operative field. Maximum barrier sterile technique with sterile gowns and gloves were used for the procedure. Under direct ultrasound guidance, the left popliteal vein was access with a micropuncture kit after the overlying soft tissues were anesthetized with 1% lidocaine. An ultrasound image was saved for documentation purposed. This allowed for placement of a 8-French vascular sheath. A limited venogram was performed through the side arm of the vascular sheath. With the use of a regular glidewire, a Kumpe catheter was advanced through the left femoral, external iliac vein and through the left common iliac vein to the level of the IVC. Limited vena cavogram demonstrates complete occlusion of the distal aspect of the IVC extending to involve both left common iliac veins. Ultimately, a moderate to long segment apparently extrinsic malignant narrowing involving the mid/distal aspect of the IVC was successfully traversed with inferior venogram demonstrating patency of the more cranial infrarenal IVC. Next, with the use of a Angiojet device, approximately 250 cc of a solution of 10  mg of tPA mixed in 500 mg of saline was infused throughout the occluded segment of the IVC, left common and external iliac veins to the level of the left common femoral vein. Attention was now paid towards access of the right lower extremity popliteal vein. After the overlying soft tissues were anesthetized with 1% lidocaine, the right popliteal vein was accessed micropuncture kit. Limited venogram was performed through the outer micro puncture sheath. This allowed for placement of a 6 French, 35 cm vascular sheath. Again, with the use of a regular glidewire, a Kumpe catheter was advanced beyond the obstructing caval lesion. Contrast injection confirmed appropriate positioning. Next, after approximately 20 minute tPA dwell, mechanical thrombectomy was performed with the Angiojet device both from the left lower extremity access as well as the right lower extremity access. Limited vena cavagram was performed via the right lower extremity access. Kumpe catheter was utilized for measurement purposes and ultimately a 90 cm/20-cm infusion catheter was placed from the right lower extremity access and a 90 cm/30 cm infusion catheter was placed from the left lower extremity access adequately covering the occluded segment of the bilateral venous systems as well as the caudal aspect of the IVC. Postprocedural spot fluoroscopic images were obtained. Both popliteal vascular sheath were secured in place. Dressings were applied. Patient's was subjectively cold by the end of the procedure with associated elevation in his blood pressure and as such he was administered 10 mg of hydralazine and bear hugger warming device was applied. The patient otherwise tolerated the procedure  well without immediate postprocedural complication. FINDINGS: Sonographic evaluation demonstrates patency of the bilateral popliteal veins. Contrast injection demonstrates patency of the bilateral popliteal and superficial femoral veins. There is occlusive  thrombus extending from the left common femoral vein and the right common iliac vein through the mid/caudal aspect of the IVC secondary to a moderate to long segment length presumably extrinsic malignant occlusion secondary to known adjacent bulky retroperitoneal lymphadenopathy. Following pharmacological and mechanical thrombectomy as detailed above, there is restored flow as demonstrated on limited right central pelvic venogram with large amount of residual thrombus. Following infusion catheter placement, both side ports traverse both occluded segments of the IVC and pelvic venous systems. IMPRESSION: 1. 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 2. Successful initiation of bilateral mechanical and pharmacologic thrombectomy with bilateral infusion catheters appropriately positioned. PLAN: The patient will be transferred to ICU for overnight lytic infusion and will return tomorrow for repeat venogram and potential intervention. Electronically Signed   By: Sandi Mariscal M.D.   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 Patient has experienced minimal improvement since initiation of IV heparin, and as such request made for initiation of catheter directed thrombolysis for symptomatic purposes. EXAM: 1. ULTRASOUND GUIDANCE FOR VASCULAR ACCESS X2 2. FLUOROSCOPIC GUIDED PHARMACOLOGICAL AND MECHANICAL THROMBECTOMY WITH ANGIOJET DEVICE 3. BILATERAL LOWER EXTREMITY VENOGRAM AND PLACEMENT OF INFUSION CATHETERS COMPARISON:  CT chest, abdomen pelvis-04/11/2019; abdominal MRV - 04/13/2019 MEDICATIONS: Hydralazine 10 mg IV, administered near the completion of the procedure  secondary to persistently elevated blood pressure. CONTRAST:  60 cc Omnipaque 300 ANESTHESIA/SEDATION: Moderate (conscious) sedation was employed during this procedure. A total of Versed 3 mg and Fentanyl 125 mcg was administered intravenously. Moderate Sedation Time: 67 minutes. The patient's level of consciousness and vital signs were monitored continuously by radiology nursing throughout the procedure under my direct supervision. FLUOROSCOPY TIME:  18 minutes, 18 seconds (388 mGy) COMPLICATIONS: None immediate. TECHNIQUE: Informed written consent was obtained from the patient after a discussion of the risks (including but not limited to bleeding, including nontarget bleeding given history of malignancy, infection, pulmonary embolism and necessity for venous angioplasty and/or stent placement), benefits and alternatives to treatment. Questions regarding the procedure were encouraged and answered. A timeout was performed prior to the initiation of the procedure. The patient was placed prone on the fluoroscopy table and the skin posterior to the bilateral knees was prepped and draped in the usual sterile fashion, and a sterile drape was applied covering the operative field. Maximum barrier sterile technique with sterile gowns and gloves were used for the procedure. Under direct ultrasound guidance, the left popliteal vein was access with a micropuncture kit after the overlying soft tissues were anesthetized with 1% lidocaine. An ultrasound image was saved for documentation purposed. This allowed for placement of a 8-French vascular sheath. A limited venogram was performed through the side arm of the vascular sheath. With the use of a regular glidewire, a Kumpe catheter was advanced through the left femoral, external iliac vein and through the left common iliac vein to the level of the IVC. Limited vena cavogram demonstrates complete occlusion of the distal aspect of the IVC extending to involve both left common  iliac veins.  Ultimately, a moderate to long segment apparently extrinsic malignant narrowing involving the mid/distal aspect of the IVC was successfully traversed with inferior venogram demonstrating patency of the more cranial infrarenal IVC. Next, with the use of a Angiojet device, approximately 250 cc of a solution of 10 mg of tPA mixed in 500 mg of saline was infused throughout the occluded segment of the IVC, left common and external iliac veins to the level of the left common femoral vein. Attention was now paid towards access of the right lower extremity popliteal vein. After the overlying soft tissues were anesthetized with 1% lidocaine, the right popliteal vein was accessed micropuncture kit. Limited venogram was performed through the outer micro puncture sheath. This allowed for placement of a 6 French, 35 cm vascular sheath. Again, with the use of a regular glidewire, a Kumpe catheter was advanced beyond the obstructing caval lesion. Contrast injection confirmed appropriate positioning. Next, after approximately 20 minute tPA dwell, mechanical thrombectomy was performed with the Angiojet device both from the left lower extremity access as well as the right lower extremity access. Limited vena cavagram was performed via the right lower extremity access. Kumpe catheter was utilized for measurement purposes and ultimately a 90 cm/20-cm infusion catheter was placed from the right lower extremity access and a 90 cm/30 cm infusion catheter was placed from the left lower extremity access adequately covering the occluded segment of the bilateral venous systems as well as the caudal aspect of the IVC. Postprocedural spot fluoroscopic images were obtained. Both popliteal vascular sheath were secured in place. Dressings were applied. Patient's was subjectively cold by the end of the procedure with associated elevation in his blood pressure and as such he was administered 10 mg of hydralazine and bear hugger warming  device was applied. The patient otherwise tolerated the procedure well without immediate postprocedural complication. FINDINGS: Sonographic evaluation demonstrates patency of the bilateral popliteal veins. Contrast injection demonstrates patency of the bilateral popliteal and superficial femoral veins. There is occlusive thrombus extending from the left common femoral vein and the right common iliac vein through the mid/caudal aspect of the IVC secondary to a moderate to long segment length presumably extrinsic malignant occlusion secondary to known adjacent bulky retroperitoneal lymphadenopathy. Following pharmacological and mechanical thrombectomy as detailed above, there is restored flow as demonstrated on limited right central pelvic venogram with large amount of residual thrombus. Following infusion catheter placement, both side ports traverse both occluded segments of the IVC and pelvic venous systems. IMPRESSION: 1. 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 2. Successful initiation of bilateral mechanical and pharmacologic thrombectomy with bilateral infusion catheters appropriately positioned. PLAN: The patient will be transferred to ICU for overnight lytic infusion and will return tomorrow for repeat venogram and potential intervention. Electronically Signed   By: Sandi Mariscal M.D.   On: 04/15/2019 17:04   CT BIOPSY  Result Date: 04/12/2019 INDICATION: 39 year old male with a history of testicular cancer previously treated. He presents with new retroperitoneal adenopathy EXAM: CT BIOPSY MEDICATIONS: None. ANESTHESIA/SEDATION: Moderate (conscious) sedation was employed during this procedure. A total of Versed 2.0 mg and Fentanyl 100 mcg was administered intravenously. Moderate Sedation Time: 14 minutes. The patient's level of consciousness and vital signs were monitored continuously by  radiology nursing throughout the procedure under my direct supervision. FLUOROSCOPY TIME:  CT COMPLICATIONS: None PROCEDURE: Informed written consent was obtained from the patient after a thorough  discussion of the procedural risks, benefits and alternatives. All questions were addressed. Maximal Sterile Barrier Technique was utilized including caps, mask, sterile gowns, sterile gloves, sterile drape, hand hygiene and skin antiseptic. A timeout was performed prior to the initiation of the procedure. Patient positioned prone position on CT gantry table. Scout CT acquired for planning purposes. The patient was then prepped and draped in the usual sterile fashion. 1% lidocaine was used for local anesthesia. Introducer needle was then placed targeting the left-sided retroperitoneal lymphadenopathy. Once we confirmed needle tip position, multiple 18 gauge core biopsy were acquired. Specimen placed in the saline as S fresh specimen. Needle was removed and a final CT was acquired. Patient tolerated the procedure well and remained hemodynamically stable throughout. No complications were encountered and no significant blood loss. IMPRESSION: Status post CT-guided biopsy of left retroperitoneal lymphadenopathy. Signed, Dulcy Fanny. Dellia Nims, RPVI [facility] Electronically Signed   By: [name] D.O.   On: 04/12/2019 12:56   DG Chest Port 1 View  Result Date: 04/18/2019 CLINICAL DATA:  Fever, testicular cancer EXAM: PORTABLE CHEST 1 VIEW COMPARISON:  None. FINDINGS: Normal heart size. Normal mediastinal contour. No pneumothorax. No pleural effusion. Slightly low lung volumes. Minimal streaky opacities at both lung bases. No pulmonary edema. IMPRESSION: Slightly low lung volumes. Minimal streaky opacities at the lung bases, favoring minimal atelectasis. No consolidative airspace disease to suggest a pneumonia. Electronically Signed   By: Ilona Sorrel M.D.   On:  04/18/2019 15:41   DG Abd Portable 1V  Result Date: 04/18/2019 CLINICAL DATA:  Abdominal pain and distension EXAM: PORTABLE ABDOMEN - 1 VIEW COMPARISON:  04/11/2019 FINDINGS: Supine frontal view of the abdomen and pelvis excludes the hemidiaphragms, right flank, and lower pelvis by collimation. There is diffuse gaseous distention of the colon, likely reflecting postoperative ileus. Stable position of the IVC stent. No masses or abnormal calcifications. IMPRESSION: 1. Distended gas-filled colon consistent with postprocedural ileus. 2. IVC stent as above Electronically Signed   By: Randa Ngo M.D.   On: 04/18/2019 08:41   ECHOCARDIOGRAM COMPLETE  Result Date: 04/19/2019   ECHOCARDIOGRAM REPORT   Patient Name:   Timothy [name] Date of Exam: 04/19/2019 Medical Rec #:  [id number]      Height:       75.0 in Accession #:    [id number]     Weight:       216.9 lb Date of Birth:  1980-06-13       BSA:          2.27 m Patient Age:    38 years       BP:           132/83 mmHg Patient Gender: M              HR:           125 bpm. Exam Location:  Inpatient Procedure: 2D Echo, Color Doppler and Cardiac Doppler                          MODIFIED REPORT: This report was modified by Cherlynn Kaiser MD on 04/19/2019 due to.  Indications:     Fever  History:         Patient has no prior history of Echocardiogram examinations.                  Testicular cancer; Signs/Symptoms:Fever.  Sonographer:     Ander Purpura  [place] Referring Phys:  [id number] [place] Diagnosing Phys: Cherlynn Kaiser MD  Sonographer Comments: Suboptimal apical window. IMPRESSIONS  1. Left ventricular ejection fraction, by visual estimation, is 60 to 65%. The left ventricle has normal function. Mildly increased left ventricular posterior wall thickness. There is mildly increased left ventricular hypertrophy.  2. Global right ventricle has normal systolic function.The right ventricular size is normal. Right vetricular wall thickness was not assessed.  3. The  mitral valve is normal in structure. No evidence of mitral valve regurgitation. No evidence of mitral stenosis.  4. The tricuspid valve is normal in structure. Tricuspid valve regurgitation is not demonstrated.  5. The aortic valve is normal in structure. Aortic valve regurgitation is not visualized. No evidence of aortic valve sclerosis or stenosis.  6. The inferior vena cava is normal in size with greater than 50% respiratory variability, suggesting right atrial pressure of 3 mmHg.  7. No evidence of valvular vegetations on this transthoracic echocardiogram, however acoustic windows are suboptimal. FINDINGS  Left Ventricle: Left ventricular ejection fraction, by visual estimation, is 60 to 65%. The left ventricle has normal function. The left ventricle is not well visualized. Mildly increased left ventricular posterior wall thickness. There is mildly increased left ventricular hypertrophy. Left ventricular diastolic parameters were normal. Normal left atrial pressure. Right Ventricle: The right ventricular size is normal. Right vetricular wall thickness was not assessed. Global RV systolic function is has normal systolic function. Left Atrium: Left atrial size was normal in size. Right Atrium: Right atrial size was normal in size Pericardium: There is no evidence of pericardial effusion. Mitral Valve: The mitral valve is normal in structure. No evidence of mitral valve regurgitation. No evidence of mitral valve stenosis by observation. Tricuspid Valve: The tricuspid valve is normal in structure. Tricuspid valve regurgitation is not demonstrated. Aortic Valve: The aortic valve is normal in structure. Aortic valve regurgitation is not visualized. The aortic valve is structurally normal, with no evidence of sclerosis or stenosis. Pulmonic Valve: The pulmonic valve was normal in structure. Pulmonic valve regurgitation is not visualized. Pulmonic regurgitation is not visualized. Aorta: The aortic root is normal in size  and structure. Venous: The inferior vena cava is normal in size with greater than 50% respiratory variability, suggesting right atrial pressure of 3 mmHg. IAS/Shunts: The interatrial septum was not well visualized. Additional Comments: No evidence of valvular vegetations on this transthoracic echocardiogram. Would recommend a transesophageal echocardiogram to exclude infective endocarditis if clinically indicated.  LEFT VENTRICLE PLAX 2D LVIDd:         5.00 cm  Diastology LVIDs:         3.10 cm  LV e' lateral: 14.10 cm/s LV PW:         1.10 cm  LV e' medial:  12.30 cm/s LV IVS:        0.80 cm LVOT diam:     2.40 cm LV SV:         80 ml LV SV Index:   35.02 LVOT Area:     4.52 cm  RIGHT VENTRICLE RV S prime:     27.10 cm/s TAPSE (M-mode): 2.8 cm LEFT ATRIUM           Index LA diam:      3.40 cm 1.50 cm/m LA Vol (A4C): 38.8 ml 17.08 ml/m  AORTIC VALVE LVOT Vmax:   105.00 cm/s LVOT Vmean:  74.200 cm/s LVOT VTI:    0.167 m  AORTA Ao Root diam: 3.60 cm  SHUNTS  Systemic VTI:  0.17 m Systemic Diam: 2.40 cm  Cherlynn Kaiser MD Electronically signed by Cherlynn Kaiser MD Signature Date/Time: 04/19/2019/7:59:29 PM    Final (Updated)    IR TRANSCATH PLC STENT 1ST ART NOT LE CV CAR VERT CAR  Result Date: 04/16/2019 INDICATION: History of newly diagnosed recurrent testicular cancer, now with bulky 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 Patient was initiated bilateral pelvic/IVC catheter directed thrombolysis day prior (04/15/2019), and returns today following 24 hours of tPA infusion. EXAM: 1. IR THROMB F/U EVAL ART/VEN FINAL DAY 2. FLUOROSCOPIC GUIDED ANGIOPLASTY AND STENT PLACEMENT OF THE IVC COMPARISON:  Initiation of bilateral catheter directed pelvic venous and IVC thrombolysis - 04/15/2019; CT abdomen pelvis - 04/11/2019 MEDICATIONS: Heparin 7000 units CONTRAST:  85 cc Omnipaque 300 ANESTHESIA/SEDATION: Moderate (conscious) sedation  was employed during this procedure. A total of Versed 2 mg and Dilaudid 3 mg was administered intravenously. Moderate Sedation Time: 128 minutes. The patient's level of consciousness and vital signs were monitored continuously by radiology nursing throughout the procedure under my direct supervision. FLUOROSCOPY TIME:  14 minutes, 12 seconds (740.8 mGy) COMPLICATIONS: None immediate. TECHNIQUE: Informed written consent was obtained from the patient after a discussion of the risks, benefits and alternatives to treatment. Questions regarding the procedure were encouraged and answered. A timeout was performed prior to the initiation of the procedure. The patient was placed prone on the fluoroscopy table and the skin posterior to the bilateral knees as well as the external portion the existing bilateral vascular sheaths and infusion catheters was prepped and draped in usual sterile fashion. The surrounding subcutaneous tissues about both popliteal approach vascular sheath was anesthetized with 1% lidocaine with epinephrine. Next, post lysis venograms were performed from the bilateral infusion catheters following removal of the bilateral infusion wires. Images were reviewed and the decision was made to proceed with additional mechanical thrombectomy. With the use of a vertebral catheter exchange length stiff Glidewires were advanced from both existing popliteal vascular sheaths superior to the nearly obstructing caval lesion. Contrast injection confirmed appropriate positioning. Stiff glide wires were advanced into the left subclavian vein. 4000 units of heparin was administered intravenously. Next, beginning with the left lower extremity, the existing 8 French vascular sheath was exchanged over the glidewire for a 13 [place] vascular sheath. The 16 mm diameter [place] Inari mechanical thrombectomy device was then utilized to perform 3 passes from the left popliteal approach from at and just below level of the  obstructing caval lesion. Post thrombectomy images were obtained from the left lower extremity. Next, the existing right lower extremity 6 French popliteal vein vascular sheath was exchanged over a guidewire for a 13 French vascular sheath. Again, from the right lower extremity approach, the 16 mm diameter [place] Inari mechanical thrombectomy device was then utilized to perform 3 passes from the level at and just below the obstructing caval lesion. Post thrombectomy images were obtained from the right lower extremity. An ACT level was obtained and an additional 3000 units of heparin was administered intravenously. The long segment eccentric irregular narrowing/subtotal occlusion of the IVC was subsequently balloon angioplastied at multiple stations with a 14 mm x 4 cm Atlas balloon. Despite adequate balloon apposition, post angioplasty venogram images demonstrate a recurrent hemodynamically significant narrowing. As such, a 20 mm x 60 mm Venovo venous stent was deployed across the superior and mid aspect of the caval lesion and was subsequently balloon angioplastied at multiple stations initially with  a 16 mm x 4 cm atlas balloon and ultimately with an 18 mm x 4 cm balloon. Post stent deployment venogram images were obtained however there is felt to be incomplete coverage of the distal aspect of the malignant narrowing. As such, an additional 20 mm x 40 mm Venovo venous stent was deployed overlapping the mid/caudal aspect of venous stent and adequately covering the caudal aspect caval lesion. Both overlapping venous stents were subsequently balloon angioplastied at multiple stations with the 18 mm atlas balloon and completion venogram images were obtained from both lower extremities. Images were reviewed and the procedure was terminated. All wires, catheters and sheaths were removed from the bilateral popliteal vein accesses and hemostasis was achieved with manual compression. Dressings applied. The patient  tolerated the procedure well without immediate postprocedural complication. FINDINGS: Initial post lysis venogram performed from the left lower extremity infusion catheter demonstrates restoration of flow through the left pelvis with moderate amount residual thrombus primarily within the peripheral aspect of the left external iliac vein as well as the left common and central aspect of the left superficial femoral veins. There is passage of contrast beyond the nearly obstructing lesion involving mid/caudal aspect of the IVC. Initial post lysis venogram performed from the right lower extremity demonstrates persistent occlusion at the level of the right common iliac vein. Following 3 passes from each lower extremity with the 16 mm diameter Clottreiever Inari mechanical thrombectomy device, nearly all residual clot burden within the pelvis was removed. 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 This lesion was again noted to be significantly caudal to the confluence of the bilateral renal veins. As such, nearly obstructing caval lesion was treated with overlapping stent deployment of 20 mm diameter Venovo venous stents which were subsequently balloon angioplastied to 18 mm diameter. Completion images demonstrate a technically excellent result with restored brisk flow through the bilateral pelvic systems and the IVC. There is a mild residual narrowing at the level of the mid/caudal aspect of the IVC, though not felt to result in a residual hemodynamically significant stenosis. No evidence of complication. Specifically, no evidence of contrast extravasation or vessel dissection. 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 PLAN: - Maintain IV heparin drip until patient is successfully converted to long-term anticoagulant. - Recommend maintaining SCD devices while in bed. Electronically Signed   By: Sandi Mariscal M.D.   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 EXAM: MRI ABDOMEN WITHOUT AND WITH CONTRAST TECHNIQUE: Multiplanar multisequence MR imaging of the abdomen was performed both before and after the administration of intravenous contrast. CONTRAST:  33m GADAVIST GADOBUTROL 1 MMOL/ML IV SOLN COMPARISON:  CT 04/11/2019 FINDINGS: Vascular: Arterial: Unremarkable course caliber and contour of the visualized abdominal aorta. Unremarkable course caliber and contour of the bilateral iliac arteries and proximal femoral arteries. Bilateral hypogastric arteries are patent. Venous: Right iliac/femoral system: Flow signal is maintained within the visualized proximal femoral veins. Flow signal maintained into the external iliac vein. Absent flow signal with evidence of thrombus involving the proximal right common  iliac vein to the confluence and into the IVC. Expansion of the right common iliac vein. Multiple pelvic collateral vessels. Left iliac/femoral system: Absent flow signal through the length of the left iliac venous system extending through the femoral vein. Expansion of the left iliac and femoral venous system. Multiple pelvic collateral veins. IVC: The IVC is not well evaluated at the level of the adenopathy. Absent flow signal above the confluence of the iliac veins compatible with ilio caval thrombus. The juxtarenal IVC is patent with maintained flow signal and maintain flow signal of the  bilateral renal veins. Nonvascular: Unremarkable musculoskeletal structures. Partially distended urinary bladder. Unremarkable appearance of the visualized kidneys and GI system. Edema of the lower abdominal wall/pelvic body wall, as well as edema of the left proximal lower extremity. 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 Pathologic lymphadenopathy of the retroperitoneum, similar to comparison CT, contributing to external compression of the infrarenal IVC. The current MR is nondiagnostic to confirm or rule out tumor thrombus. Lower abdominal/pelvic wall edema including edema of the proximal left lower extremity. Signed, Dulcy Fanny. Dellia Nims, RPVI [facility] Electronically Signed   By: [name] D.O.   On: 04/13/2019 14:14   VAS [place] LOWER EXTREMITY VENOUS (DVT)  Result Date: 04/11/2019  Lower Venous Study Indications: Swelling.  Risk Factors: None identified. Anticoagulation: Heparin. Limitations: Poor ultrasound/tissue interface and bowel gas. Comparison Study: No prior studies. Performing Technologist: Oliver Hum RVT  Examination Guidelines: A complete evaluation includes B-mode imaging, spectral Doppler, color Doppler, and power Doppler as needed of all accessible portions of each vessel. Bilateral testing is considered an integral part of a complete examination. Limited examinations for reoccurring indications may be performed as noted.  +---------+---------------+---------+-----------+----------+--------------+ RIGHT    CompressibilityPhasicitySpontaneityPropertiesThrombus Aging +---------+---------------+---------+-----------+----------+--------------+ CFV      Full           Yes      Yes                                 +---------+---------------+---------+-----------+----------+--------------+ SFJ       Full                                                        +---------+---------------+---------+-----------+----------+--------------+ FV Prox  Full                                                        +---------+---------------+---------+-----------+----------+--------------+ FV Mid   Full                                                        +---------+---------------+---------+-----------+----------+--------------+ FV DistalFull                                                        +---------+---------------+---------+-----------+----------+--------------+  PFV      Full                                                        +---------+---------------+---------+-----------+----------+--------------+ POP      Full           Yes      Yes                                 +---------+---------------+---------+-----------+----------+--------------+ PTV      Full                                                        +---------+---------------+---------+-----------+----------+--------------+ PERO     Full                                                        +---------+---------------+---------+-----------+----------+--------------+   +---------+---------------+---------+-----------+----------+--------------+ LEFT     CompressibilityPhasicitySpontaneityPropertiesThrombus Aging +---------+---------------+---------+-----------+----------+--------------+ CFV      None           No       No                   Acute          +---------+---------------+---------+-----------+----------+--------------+ SFJ      None                                         Acute          +---------+---------------+---------+-----------+----------+--------------+ FV Prox  None           No       No                   Acute          +---------+---------------+---------+-----------+----------+--------------+ FV Mid   None           No       No                    Acute          +---------+---------------+---------+-----------+----------+--------------+ FV DistalNone           No       No                   Acute          +---------+---------------+---------+-----------+----------+--------------+ POP      Partial        No       No                   Acute          +---------+---------------+---------+-----------+----------+--------------+ PTV      Full                                                        +---------+---------------+---------+-----------+----------+--------------+  PERO     None                                         Acute          +---------+---------------+---------+-----------+----------+--------------+ EIV      None           No       No                   Acute          +---------+---------------+---------+-----------+----------+--------------+ Unable to visualize CIV, or IVC due to bowel gas.    Summary: Right: There is no evidence of deep vein thrombosis in the lower extremity. No cystic structure found in the popliteal fossa. Left: Findings consistent with acute deep vein thrombosis involving the left external iliac vein, common femoral vein, left femoral vein, left popliteal vein, and left peroneal veins. No cystic structure found in the popliteal fossa. Unable to visualize CIV, or IVC due to bowel gas.  *See table(s) above for measurements and observations. Electronically signed by Deitra Mayo MD on 04/11/2019 at 4:09:39 PM.    Final    IR INFUSION THROMBOL VENOUS INITIAL (MS)  Result Date: 04/15/2019 INDICATION: History of newly diagnosed recurrent testicular cancer, now with bulky 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 Patient has experienced minimal improvement since initiation of IV heparin, and as such request made for initiation of catheter directed thrombolysis for symptomatic purposes. EXAM: 1. ULTRASOUND  GUIDANCE FOR VASCULAR ACCESS X2 2. FLUOROSCOPIC GUIDED PHARMACOLOGICAL AND MECHANICAL THROMBECTOMY WITH ANGIOJET DEVICE 3. BILATERAL LOWER EXTREMITY VENOGRAM AND PLACEMENT OF INFUSION CATHETERS COMPARISON:  CT chest, abdomen pelvis-04/11/2019; abdominal MRV - 04/13/2019 MEDICATIONS: Hydralazine 10 mg IV, administered near the completion of the procedure secondary to persistently elevated blood pressure. CONTRAST:  60 cc Omnipaque 300 ANESTHESIA/SEDATION: Moderate (conscious) sedation was employed during this procedure. A total of Versed 3 mg and Fentanyl 125 mcg was administered intravenously. Moderate Sedation Time: 67 minutes. The patient's level of consciousness and vital signs were monitored continuously by radiology nursing throughout the procedure under my direct supervision. FLUOROSCOPY TIME:  18 minutes, 18 seconds (094 mGy) COMPLICATIONS: None immediate. TECHNIQUE: Informed written consent was obtained from the patient after a discussion of the risks (including but not limited to bleeding, including nontarget bleeding given history of malignancy, infection, pulmonary embolism and necessity for venous angioplasty and/or stent placement), benefits and alternatives to treatment. Questions regarding the procedure were encouraged and answered. A timeout was performed prior to the initiation of the procedure. The patient was placed prone on the fluoroscopy table and the skin posterior to the bilateral knees was prepped and draped in the usual sterile fashion, and a sterile drape was applied covering the operative field. Maximum barrier sterile technique with sterile gowns and gloves were used for the procedure. Under direct ultrasound guidance, the left popliteal vein was access with a micropuncture kit after the overlying soft tissues were anesthetized with 1% lidocaine. An ultrasound image was saved for documentation purposed. This allowed for placement of a 8-French vascular sheath. A limited venogram was  performed through the side arm of the vascular sheath. With the use of a regular glidewire, a Kumpe catheter was advanced through the left femoral, external iliac vein and through the left common iliac vein to the level of the  IVC. Limited vena cavogram demonstrates complete occlusion of the distal aspect of the IVC extending to involve both left common iliac veins. Ultimately, a moderate to long segment apparently extrinsic malignant narrowing involving the mid/distal aspect of the IVC was successfully traversed with inferior venogram demonstrating patency of the more cranial infrarenal IVC. Next, with the use of a Angiojet device, approximately 250 cc of a solution of 10 mg of tPA mixed in 500 mg of saline was infused throughout the occluded segment of the IVC, left common and external iliac veins to the level of the left common femoral vein. Attention was now paid towards access of the right lower extremity popliteal vein. After the overlying soft tissues were anesthetized with 1% lidocaine, the right popliteal vein was accessed micropuncture kit. Limited venogram was performed through the outer micro puncture sheath. This allowed for placement of a 6 French, 35 cm vascular sheath. Again, with the use of a regular glidewire, a Kumpe catheter was advanced beyond the obstructing caval lesion. Contrast injection confirmed appropriate positioning. Next, after approximately 20 minute tPA dwell, mechanical thrombectomy was performed with the Angiojet device both from the left lower extremity access as well as the right lower extremity access. Limited vena cavagram was performed via the right lower extremity access. Kumpe catheter was utilized for measurement purposes and ultimately a 90 cm/20-cm infusion catheter was placed from the right lower extremity access and a 90 cm/30 cm infusion catheter was placed from the left lower extremity access adequately covering the occluded segment of the bilateral venous systems as  well as the caudal aspect of the IVC. Postprocedural spot fluoroscopic images were obtained. Both popliteal vascular sheath were secured in place. Dressings were applied. Patient's was subjectively cold by the end of the procedure with associated elevation in his blood pressure and as such he was administered 10 mg of hydralazine and bear hugger warming device was applied. The patient otherwise tolerated the procedure well without immediate postprocedural complication. FINDINGS: Sonographic evaluation demonstrates patency of the bilateral popliteal veins. Contrast injection demonstrates patency of the bilateral popliteal and superficial femoral veins. There is occlusive thrombus extending from the left common femoral vein and the right common iliac vein through the mid/caudal aspect of the IVC secondary to a moderate to long segment length presumably extrinsic malignant occlusion secondary to known adjacent bulky retroperitoneal lymphadenopathy. Following pharmacological and mechanical thrombectomy as detailed above, there is restored flow as demonstrated on limited right central pelvic venogram with large amount of residual thrombus. Following infusion catheter placement, both side ports traverse both occluded segments of the IVC and pelvic venous systems. IMPRESSION: 1. 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 2. Successful initiation of bilateral mechanical and pharmacologic thrombectomy with bilateral infusion catheters appropriately positioned. PLAN: The patient will be transferred to ICU for overnight lytic infusion and will return tomorrow for repeat venogram and potential intervention. Electronically Signed   By: Sandi Mariscal M.D.   On: 04/15/2019 17:04   IR THROMB F/U EVAL ART/VEN FINAL DAY (MS)  Result Date: 04/16/2019 INDICATION: History of newly diagnosed recurrent testicular cancer,  now with bulky 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 Patient was initiated bilateral pelvic/IVC catheter directed thrombolysis day prior (04/15/2019), and returns today following 24 hours of tPA infusion. EXAM: 1. IR THROMB F/U EVAL ART/VEN FINAL DAY 2. FLUOROSCOPIC GUIDED ANGIOPLASTY  AND STENT PLACEMENT OF THE IVC COMPARISON:  Initiation of bilateral catheter directed pelvic venous and IVC thrombolysis - 04/15/2019; CT abdomen pelvis - 04/11/2019 MEDICATIONS: Heparin 7000 units CONTRAST:  85 cc Omnipaque 300 ANESTHESIA/SEDATION: Moderate (conscious) sedation was employed during this procedure. A total of Versed 2 mg and Dilaudid 3 mg was administered intravenously. Moderate Sedation Time: 128 minutes. The patient's level of consciousness and vital signs were monitored continuously by radiology nursing throughout the procedure under my direct supervision. FLUOROSCOPY TIME:  14 minutes, 12 seconds (283.6 mGy) COMPLICATIONS: None immediate. TECHNIQUE: Informed written consent was obtained from the patient after a discussion of the risks, benefits and alternatives to treatment. Questions regarding the procedure were encouraged and answered. A timeout was performed prior to the initiation of the procedure. The patient was placed prone on the fluoroscopy table and the skin posterior to the bilateral knees as well as the external portion the existing bilateral vascular sheaths and infusion catheters was prepped and draped in usual sterile fashion. The surrounding subcutaneous tissues about both popliteal approach vascular sheath was anesthetized with 1% lidocaine with epinephrine. Next, post lysis venograms were performed from the bilateral infusion catheters following removal of the bilateral infusion wires. Images were reviewed and the decision was made to proceed with additional mechanical thrombectomy. With the use of a vertebral  catheter exchange length stiff Glidewires were advanced from both existing popliteal vascular sheaths superior to the nearly obstructing caval lesion. Contrast injection confirmed appropriate positioning. Stiff glide wires were advanced into the left subclavian vein. 4000 units of heparin was administered intravenously. Next, beginning with the left lower extremity, the existing 8 French vascular sheath was exchanged over the glidewire for a 13 [place] vascular sheath. The 16 mm diameter Clottreiever Inari mechanical thrombectomy device was then utilized to perform 3 passes from the left popliteal approach from at and just below level of the obstructing caval lesion. Post thrombectomy images were obtained from the left lower extremity. Next, the existing right lower extremity 6 French popliteal vein vascular sheath was exchanged over a guidewire for a 13 French vascular sheath. Again, from the right lower extremity approach, the 16 mm diameter Clottreiever Inari mechanical thrombectomy device was then utilized to perform 3 passes from the level at and just below the obstructing caval lesion. Post thrombectomy images were obtained from the right lower extremity. An ACT level was obtained and an additional 3000 units of heparin was administered intravenously. The long segment eccentric irregular narrowing/subtotal occlusion of the IVC was subsequently balloon angioplastied at multiple stations with a 14 mm x 4 cm Atlas balloon. Despite adequate balloon apposition, post angioplasty venogram images demonstrate a recurrent hemodynamically significant narrowing. As such, a 20 mm x 60 mm Venovo venous stent was deployed across the superior and mid aspect of the caval lesion and was subsequently balloon angioplastied at multiple stations initially with a 16 mm x 4 cm atlas balloon and ultimately with an 18 mm x 4 cm balloon. Post stent deployment venogram images were obtained however there is felt to be incomplete coverage  of the distal aspect of the malignant narrowing. As such, an additional 20 mm x 40 mm Venovo venous stent was deployed overlapping the mid/caudal aspect of venous stent and adequately covering the caudal aspect caval lesion. Both overlapping venous stents were subsequently balloon angioplastied at multiple stations with the 18 mm atlas balloon and completion venogram images were obtained from both lower extremities. Images were reviewed and the procedure was terminated. All wires,  catheters and sheaths were removed from the bilateral popliteal vein accesses and hemostasis was achieved with manual compression. Dressings applied. The patient tolerated the procedure well without immediate postprocedural complication. FINDINGS: Initial post lysis venogram performed from the left lower extremity infusion catheter demonstrates restoration of flow through the left pelvis with moderate amount residual thrombus primarily within the peripheral aspect of the left external iliac vein as well as the left common and central aspect of the left superficial femoral veins. There is passage of contrast beyond the nearly obstructing lesion involving mid/caudal aspect of the IVC. Initial post lysis venogram performed from the right lower extremity demonstrates persistent occlusion at the level of the right common iliac vein. Following 3 passes from each lower extremity with the 16 mm diameter Clottreiever Inari mechanical thrombectomy device, nearly all residual clot burden within the pelvis was removed. 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 This lesion was again noted to be significantly caudal to the confluence of the bilateral renal veins. As such, nearly obstructing caval lesion was  treated with overlapping stent deployment of 20 mm diameter Venovo venous stents which were subsequently balloon angioplastied to 18 mm diameter. Completion images demonstrate a technically excellent result with restored brisk flow through the bilateral pelvic systems and the IVC. There is a mild residual narrowing at the level of the mid/caudal aspect of the IVC, though not felt to result in a residual hemodynamically significant stenosis. No evidence of complication. Specifically, no evidence of contrast extravasation or vessel dissection. 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 PLAN: - Maintain IV heparin drip until patient is successfully converted to long-term anticoagulant. - Recommend maintaining SCD devices while in bed. Electronically Signed   By: Sandi Mariscal M.D.   On: 04/16/2019 16:26    ASSESSMENT AND PLAN:  1.  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  extensive Left lower extremity DVT, s/p thrombolysis and IVC stent placement 4. Fever with unknown origin 5. ilius   6. Worsening anemia  7.deconditioning  8.  History of stage Ib seminoma testicular cancer diagnosed in 2012, status post right orchiectomy-no adjuvant chemotherapy given  Recommendations: -I think he is worsening anemia is related to recent retroperitoneal hematoma, and dilutional due to high volume IVF lately, no new signs of bleeding  -fever could be related to infection (although cultures have been negative), thrombosis and recent hematoma. Tumor fever is also possible  -he is deconditioned, needs PT -due to his illus, he has been NPO, hopefully he can start liquid tomorrow, since he has had multiple BM today -He is unlikely going home soon, and his fever is possible related to tumor. I recommend him to start  inpt chemo with cisplatin and etoposide (daily infusion 6-8 hours) for 5 days, probably starting next Monday.  We discussed the standard chemotherapy is BET (bleomycin, cisplatin and etoposide).  Due to his recent extensive DVT and vascular procedure, and low PS and deconditioning, I will hold etoposide for the first cycle. --Chemotherapy consent: Side effects including but does not not limited to, fatigue, nausea, vomiting, diarrhea, hair loss, neuropathy, fluid retention, renal and kidney dysfunction, neutropenic fever, needed for blood transfusion, bleeding, infertility etc, were discussed with patient and his fiance in  great detail. He agrees to proceed.  I recommend sperm banking, unfortunately this is unlikely to happen in the next few days, so I recommend him to do it before cycle 2. -I will talk to [facility] oncology charge nurse, and probably transfer him to [facility] on Sunday, to start chemo on Monday 2/8 -chemo teaching tomorrow  -may need a port or PICC, will discuss with IR tomorrow  -will f/u tomorrow   Timothy [name]  04/20/2019 .

## 2019-04-20 NOTE — Progress Notes (Signed)
ANTICOAGULATION CONSULT NOTE Pharmacy Consult for Heparin Indication: DVT  No Known Allergies  Patient Measurements: Height: 6\' 3"  (190.5 cm) Weight: 216 lb 14.9 oz (98.4 kg) IBW/kg (Calculated) : 84.5  Vital Signs: Temp: 100.7 F (38.2 C) (02/04 0620) Temp Source: Oral (02/04 0620) BP: 126/69 (02/04 0620) Pulse Rate: 119 (02/04 0620)  Labs: Recent Labs    04/18/19 1051 04/18/19 1051 04/18/19 2105 04/19/19 0151 04/20/19 0231  HGB 8.3*   < >  --  8.5* 7.3*  HCT 25.4*  --   --  26.0* 22.6*  PLT 254  --   --  337 398  HEPARINUNFRC 0.28*   < > 0.47 0.52 0.60  CREATININE 0.91  --   --  0.95 1.01  CKTOTAL  --   --   --   --  624*   < > = values in this interval not displayed.    Estimated Creatinine Clearance: 120.8 mL/min (by C-G formula based on SCr of 1.01 mg/dL).   Assessment: 39 yo M with DVT s/p thrombectomy/lysis, on heparin drip at 2600 units/hr  2/3 AM  update:  Heparin level therapeutic at 0.6, no overt bleeding noted, however Hgb 8.5>>7.3. Would monitor. PLTC stable  Goal of Therapy: Heparin level 0.3-0.7 units/ml Monitor platelets by anticoagulation protocol: Yes  Plan: Continue heparin at 2600 units/hr Daily Heparin level/CBC  Gilberto Stanforth A. Levada Dy, PharmD, BCPS, FNKF Clinical Pharmacist [place] Please utilize Amion for appropriate phone number to reach the unit pharmacist ([place])

## 2019-04-21 ENCOUNTER — Inpatient Hospital Stay (HOSPITAL_COMMUNITY): Payer: BLUE CROSS/BLUE SHIELD

## 2019-04-21 ENCOUNTER — Inpatient Hospital Stay: Payer: Self-pay

## 2019-04-21 DIAGNOSIS — T8149XA Infection following a procedure, other surgical site, initial encounter: Secondary | ICD-10-CM

## 2019-04-21 LAB — COMPREHENSIVE METABOLIC PANEL
ALT: 28 U/L (ref 0–44)
AST: 35 U/L (ref 15–41)
Albumin: 2.3 g/dL — ABNORMAL LOW (ref 3.5–5.0)
Alkaline Phosphatase: 69 U/L (ref 38–126)
Anion gap: 14 (ref 5–15)
BUN: 11 mg/dL (ref 6–20)
CO2: 20 mmol/L — ABNORMAL LOW (ref 22–32)
Calcium: 7.7 mg/dL — ABNORMAL LOW (ref 8.9–10.3)
Chloride: 103 mmol/L (ref 98–111)
Creatinine, Ser: 1.33 mg/dL — ABNORMAL HIGH (ref 0.61–1.24)
GFR calc Af Amer: >60 mL/min (ref >60)
GFR calc non Af Amer: >60 mL/min (ref >60)
Glucose, Bld: 107 mg/dL — ABNORMAL HIGH (ref 70–99)
Potassium: 3.5 mmol/L (ref 3.5–5.1)
Sodium: 137 mmol/L (ref 135–145)
Total Bilirubin: 1.1 mg/dL (ref 0.3–1.2)
Total Protein: 5.9 g/dL — ABNORMAL LOW (ref 6.5–8.1)

## 2019-04-21 LAB — CBC
HCT: 21.1 % — ABNORMAL LOW (ref 39.0–52.0)
Hemoglobin: 7 g/dL — ABNORMAL LOW (ref 13.0–17.0)
MCH: 29.8 pg (ref 26.0–34.0)
MCHC: 33.2 g/dL (ref 30.0–36.0)
MCV: 89.8 fL (ref 80.0–100.0)
Platelets: 453 10*3/uL — ABNORMAL HIGH (ref 150–400)
RBC: 2.35 MIL/uL — ABNORMAL LOW (ref 4.22–5.81)
RDW: 13.7 % (ref 11.5–15.5)
WBC: 10.1 10*3/uL (ref 4.0–10.5)
nRBC: 0.2 % (ref 0.0–0.2)

## 2019-04-21 LAB — HEPARIN LEVEL (UNFRACTIONATED): Heparin Unfractionated: 0.62 IU/mL (ref 0.30–0.70)

## 2019-04-21 NOTE — Progress Notes (Signed)
PROGRESS NOTE    Timothy [name]  [id number] DOB: December 10, 1980 DOA: 04/10/2019 PCP: Patient, No Pcp Per   Brief Narrative: 39 year old male with history of testicular carcinoma stage Ib followed at [facility] had right orchiectomy lives in [place] travel to [place] now admitted with left lower extremity extensive DVT with intra-abdominal and retroperitoneal lymphadenopathy concerning for metastatic cancer. s/p retroperitoneal LN biopsy  04/19/2019-patient was seen multiple times today due to yellow to red mucus. Fianc by the bedside. Overall he is feeling slightly better than yesterday. He continues to be n.p.o. with ileus. Denies any worsening of pain. He was seen by PT yesterday. He denies any chest pain shortness of breath cough diarrhea. He has pain in his left lower extremity but it is not worse.  04/20/2019 patient resting in bed had multiple bowel movements overnight abdomen still distended no nausea vomiting ambulated in the room yesterday. Appreciate all the consultants.  2/5 fever curve trending down.had a bm. 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 Continue heparinfor now. Will switch to Lovenox 1 mg/kg every 12 closer to when he is ready for discharge. Lovenox to be continued for 30 days once discharged then transition to oral anticoagulation 90 days of aspirin 81 mg daily Bilateral lower extremity compression stockings at least  knee-high Out of bed ambulate  Will DC Foley once he is able to ambulate  #2 fever with tachycardia-source of infection not yet known. ?tumor fever.He is on flagyl and cefepime.  vanco stopped 2/4  His T-max is 101.5  His fever curve is trending down.  White count has normalized..  Discussed with infectious disease, PCCM. Echo normal ejection fraction, no evidence of valvular infection CT chest no evidence of PE CPK 624, LDH 278, CRP 25.5, procalcitonin 0.42, white count 8.4, sed rate 100, D-dimer 7.28. He was Covid negative on admission. Discussed with ID recommends no need to repeat Covid test.  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  Discussed with urology. CT abdomen and pelvis-shows ileus and psoas fluid collection likely hematoma. Discussed with IR . They think this fluid collection is more likely to be hematoma than an abscess and not recommending I&D at this time.  #4 metastatic testicular cancer-planning to start chemo soon.dr Burr Medico discussing with patient.he is hesitant to have picc or port.  #5 ileus with abdominal distention and abdominal tenderness. Patient had another bowel movement last night. We'll start him on clear liquid diet. KUB today shows  -7 gaseous distention of large and small bowel consistent with ileus.  #6 anemia likely secondary to hemodilution and probably related to psoas hematoma and hematuria. Patient is refusing blood transfusion  Per Dr. Stephenie [name]. Timothy [name], Assistant Professor of Radiology, has kindly agreed to see Timothy [name] in continuity of his current vascular care, when Timothy [name] returns home.  Scheduling: Timothy [name]: [phone]  Dr. Ottis Stain has also kindly arranged follow-up with the Medical Oncology Department of [facility], and  Timothy [name] should have a pending office appointment upon arrival.   Estimated body mass  index is 27.11 kg/m as calculated from the following:   Height as of this encounter: 6\' 3"  (1.905 m).   Weight as of this encounter: 98.4 kg.  DVT prophylaxis:IV heparin Code Status:Full code Family Communication:Discussed with patientand fianc at bedside. disposition Plan:Patient came from home plan is to return to home. Barriers to discharge-patient spiking fever and tachycardic with unknown source of infection and currently have postop ileus, hematuria  Consultants:  Interventional radiology, PCCM  Procedures:CT-guided biopsy of the lymph node and mechanical and thrombolytic intervention to left lower extremity for acute DVT Antimicrobials: Flagyl and cefepime   subjective: Patient is resting in bed denies any nausea vomiting Had a bowel movement last night Abdomen still distended  Objective: Vitals:   04/21/19 0240 04/21/19 0453 04/21/19 0634 04/21/19 1044  BP: 137/86 (!) 146/93 (!) 150/98 135/82  Pulse: (!) 118 (!) 113 (!) 115 (!) 124  Resp: (!) 25 (!) 24 (!) 25 20  Temp: 100 F (37.8 C) 99.3 F (37.4 C) 99.1 F (37.3 C) (!) 101.5 F (38.6 C)  TempSrc: Oral Oral Oral Oral  SpO2: 94% 93% 98% 99%  Weight:      Height:        Intake/Output Summary (Last 24 hours) at 04/21/2019 1246 Last data filed at 04/21/2019 0951 Gross per 24 hour  Intake 8364.27 ml  Output 1800 ml  Net 6564.27 ml   Filed Weights   04/10/19 2005 04/18/19 0600  Weight: 90.7 kg 98.4 kg    Examination:  General exam: Appears calm and comfortable  Respiratory system: Clear to auscultation. Respiratory effort normal. Cardiovascular system: S1 & S2 heard, RRR. No JVD, murmurs, rubs, gallops or clicks. No pedal edema. Gastrointestinal system: Abdomen is distended, soft and nontender. No organomegaly or masses felt. Diminished bowel sounds heard. Central nervous system: Alert and oriented. No focal neurological deficits. Extremities: Symmetric 5 x 5 power. Skin: No rashes, lesions or  ulcers Psychiatry: Judgement and insight appear normal. Mood & affect appropriate.     Data Reviewed: I have personally reviewed following labs and imaging studies  CBC: Recent Labs  Lab 04/15/19 0751 04/15/19 1805 04/17/19 0018 04/18/19 1051 04/19/19 0151 04/20/19 0231 04/21/19 0409  WBC 6.7   < > 11.4* 14.1* 13.2* 8.4 10.1  NEUTROABS 4.8  --  9.0*  --   --  6.3  --   HGB 13.5   < > 11.0* 8.3* 8.5* 7.3* 7.0*  HCT 41.1   < > 32.8* 25.4* 26.0* 22.6* 21.1*  MCV 91.3   < > 88.9 92.0 91.9 91.5 89.8  PLT 265   < > 215 254 337 398 453*   < > = values in this interval not displayed.   Basic Metabolic Panel: Recent Labs  Lab 04/17/19 0018 04/18/19 1051 04/19/19 0151 04/20/19 0231 04/20/19 1038 04/21/19 0409  NA 130* 132* 134* 136  --  137  K 3.9 4.2 3.9 3.4*  --  3.5  CL 100 99 102 106  --  103  CO2 24 23 22  19*  --  20*  GLUCOSE 110* 105* 107* 116*  --  107*  BUN 8 6 6 8   --  11  CREATININE 1.02 0.91 0.95 1.01  --  1.33*  CALCIUM 7.8* 7.7* 7.6* 7.6*  --  7.7*  MG  --   --   --   --  2.5*  --  GFR: Estimated Creatinine Clearance: 91.8 mL/min (A) (by C-G formula based on SCr of 1.33 mg/dL (H)). Liver Function Tests: Recent Labs  Lab 04/18/19 1051 04/19/19 0151 04/20/19 0231 04/21/19 0409  AST 30 30 33 35  ALT 19 24 26 28   ALKPHOS 89 84 78 69  BILITOT 1.0 1.0 1.2 1.1  PROT 5.5* 6.1* 6.1* 5.9*  ALBUMIN 2.3* 2.3* 2.3* 2.3*   No results for input(s): LIPASE, AMYLASE in the last 168 hours. No results for input(s): AMMONIA in the last 168 hours. Coagulation Profile: No results for input(s): INR, PROTIME in the last 168 hours. Cardiac Enzymes: Recent Labs  Lab 04/20/19 0231  CKTOTAL 624*   BNP (last 3 results) No results for input(s): PROBNP in the last 8760 hours. HbA1C: No results for input(s): HGBA1C in the last 72 hours. CBG: Recent Labs  Lab 04/16/19 0729 04/16/19 1124  GLUCAP 115* 86   Lipid Profile: No results for input(s): CHOL, HDL,  LDLCALC, TRIG, CHOLHDL, LDLDIRECT in the last 72 hours. Thyroid Function Tests: No results for input(s): TSH, T4TOTAL, FREET4, T3FREE, THYROIDAB in the last 72 hours. Anemia Panel: No results for input(s): VITAMINB12, FOLATE, FERRITIN, TIBC, IRON, RETICCTPCT in the last 72 hours. Sepsis Labs: Recent Labs  Lab 04/18/19 1700 04/18/19 1822 04/18/19 2105 04/19/19 0151 04/20/19 0231  PROCALCITON 0.42 0.46  --  0.51 0.42  LATICACIDVEN  --  1.3 1.2  --   --     Recent Results (from the past 240 hour(s))  Culture, blood (routine x 2)     Status: None (Preliminary result)   Collection Time: 04/17/19 10:15 AM   Specimen: BLOOD RIGHT HAND  Result Value Ref Range Status   Specimen Description BLOOD RIGHT HAND  Final   Special Requests   Final    BOTTLES DRAWN AEROBIC ONLY Blood Culture results may not be optimal due to an inadequate volume of blood received in culture bottles   Culture   Final    NO GROWTH 3 DAYS Performed at [facility], [place] [address], [place], [place] [address]    Report Status PENDING  Incomplete  Culture, blood (routine x 2)     Status: None (Preliminary result)   Collection Time: 04/17/19 10:20 AM   Specimen: BLOOD  Result Value Ref Range Status   Specimen Description BLOOD LEFT ANTECUBITAL  Final   Special Requests   Final    BOTTLES DRAWN AEROBIC AND ANAEROBIC Blood Culture adequate volume   Culture   Final    NO GROWTH 3 DAYS Performed at [facility], [place] [address], [place], [place] [address]    Report Status PENDING  Incomplete  Culture, Urine     Status: None   Collection Time: 04/17/19 10:43 AM   Specimen: Urine, Random  Result Value Ref Range Status   Specimen Description URINE, RANDOM  Final   Special Requests NONE  Final   Culture   Final    NO GROWTH Performed at [facility], [place] [address], [place], [place] [address]    Report Status 04/18/2019 FINAL  Final         Radiology Studies: DG Abd 1 View  Result Date:  04/21/2019 CLINICAL DATA:  Ileus, history of IVC stent placement EXAM: ABDOMEN - 1 VIEW COMPARISON:  04/20/2019 FINDINGS: Supine frontal view of the abdomen and pelvis excludes the left flank and lower pelvis by collimation. There is progressive gaseous distension of large and small bowel. IVC stent unchanged.  No masses or abnormal calcifications. IMPRESSION: Progressive gaseous distension of large and small bowel consistent with ileus. Electronically Signed   By: Randa Ngo M.D.   On: 04/21/2019 08:42   DG Abd 1 View  Result Date: 04/20/2019 CLINICAL DATA:  Diffuse abdominal tenderness, abdominal distension EXAM: ABDOMEN - 1 VIEW COMPARISON:  04/19/2019 FINDINGS: 2 supine frontal views of the abdomen and pelvis demonstrate persistent diffuse gaseous distention of the colon likely postoperative ileus. No masses or abnormal calcifications. Venous stent again noted. IMPRESSION: 1. Continued postoperative ileus. Electronically Signed   By: Randa Ngo M.D.   On: 04/20/2019 15:10   DG Abd 1 View  Result Date: 04/19/2019 CLINICAL DATA:  Evaluate ileus. EXAM: ABDOMEN - 1 VIEW COMPARISON:  April 19, 2019 (acquired at 8:04 a.m. FINDINGS: Numerous dilated air-filled loops of large bowel are again seen. This is unchanged in appearance when compared to the prior study. The visualized small bowel loops are normal in caliber. A radiopaque stent is seen overlying the medial aspect of the mid right abdomen. No radio-opaque calculi or other significant radiographic abnormality are seen. IMPRESSION: 1. Stable findings likely consistent with a postoperative ileus, unchanged in appearance when compared to the prior study dated April 19, 2019 (acquired at 8:04 a.m.). Electronically Signed   By: Virgina Norfolk M.D.   On: 04/19/2019 20:28   CT ANGIO CHEST PE W OR WO CONTRAST  Result Date: 04/19/2019 CLINICAL DATA:  Hypoxia. EXAM: CT ANGIOGRAPHY CHEST WITH CONTRAST TECHNIQUE: Multidetector CT imaging of the chest was  performed using the standard protocol during bolus administration of intravenous contrast. Multiplanar CT image reconstructions and MIPs were obtained to evaluate the vascular anatomy. CONTRAST:  153mL OMNIPAQUE IOHEXOL 300 MG/ML SOLN, 91mL OMNIPAQUE IOHEXOL 350 MG/ML SOLN COMPARISON:  04/11/2019 FINDINGS: Cardiovascular: Evaluation for acute pulmonary emboli is severely limited by respiratory motion artifact and suboptimal contrast bolus timing. Given these limitations, no large centrally located pulmonary embolism was detected.Detection of smaller pulmonary emboli is not possible on this exam. The main pulmonary artery is within normal limits for size. There is no CT evidence of acute right heart strain. The visualized aorta is normal. Heart size is normal, without pericardial effusion. Mediastinum/Nodes: --No mediastinal or hilar lymphadenopathy. --No axillary lymphadenopathy. --there is an enlarged left supraclavicular lymph node measuring 1.4 cm (axial series 5, image 5). --Normal thyroid gland. --The esophagus is unremarkable Lungs/Pleura: There is atelectasis at the lung bases bilaterally. There are new small bilateral pleural effusions, left greater than right. Upper Abdomen: No acute abnormality. Musculoskeletal: No chest wall abnormality. No acute or significant osseous findings. Review of the MIP images confirms the above findings. IMPRESSION: 1. Evaluation for acute pulmonary emboli is severely limited by respiratory motion artifact and suboptimal contrast bolus timing. Given these limitations, no large centrally located pulmonary embolism was detected. 2. New small bilateral pleural effusions, left greater than right. 3. Enlarged left supraclavicular lymph node measuring 1.4 cm. This may be malignant or reactive in etiology. It is amenable to percutaneous biopsy as clinically indicated. Electronically Signed   By: Constance Holster M.D.   On: 04/19/2019 23:17   ECHOCARDIOGRAM COMPLETE  Result Date:  04/19/2019   ECHOCARDIOGRAM REPORT   Patient Name:   ZALAN HENNINGSEN Date of Exam: 04/19/2019 Medical Rec #:  [id number]      Height:       75.0 in Accession #:    [id number]     Weight:       216.9 lb Date of Birth:  02-05-1981       BSA:          2.27 m Patient Age:    38 years       BP:           132/83 mmHg Patient Gender: M              HR:           125 bpm. Exam Location:  Inpatient Procedure: 2D Echo, Color Doppler and Cardiac Doppler                          MODIFIED REPORT: This report was modified by Cherlynn Kaiser MD on 04/19/2019 due to.  Indications:     Fever  History:         Patient has no prior history of Echocardiogram examinations.                  Testicular cancer; Signs/Symptoms:Fever.  Sonographer:     Johny Chess Referring Phys:  [id number] Noland Fordyce Amare Kontos Diagnosing Phys: Cherlynn Kaiser MD  Sonographer Comments: Suboptimal apical window. IMPRESSIONS  1. Left ventricular ejection fraction, by visual estimation, is 60 to 65%. The left ventricle has normal function. Mildly increased left ventricular posterior wall thickness. There is mildly increased left ventricular hypertrophy.  2. Global right ventricle has normal systolic function.The right ventricular size is normal. Right vetricular wall thickness was not assessed.  3. The mitral valve is normal in structure. No evidence of mitral valve regurgitation. No evidence of mitral stenosis.  4. The tricuspid valve is normal in structure. Tricuspid valve regurgitation is not demonstrated.  5. The aortic valve is normal in structure. Aortic valve regurgitation is not visualized. No evidence of aortic valve sclerosis or stenosis.  6. The inferior vena cava is normal in size with greater than 50% respiratory variability, suggesting right atrial pressure of 3 mmHg.  7. No evidence of valvular vegetations on this transthoracic echocardiogram, however acoustic windows are suboptimal. FINDINGS  Left Ventricle: Left ventricular ejection fraction, by  visual estimation, is 60 to 65%. The left ventricle has normal function. The left ventricle is not well visualized. Mildly increased left ventricular posterior wall thickness. There is mildly increased left ventricular hypertrophy. Left ventricular diastolic parameters were normal. Normal left atrial pressure. Right Ventricle: The right ventricular size is normal. Right vetricular wall thickness was not assessed. Global RV systolic function is has normal systolic function. Left Atrium: Left atrial size was normal in size. Right Atrium: Right atrial size was normal in size Pericardium: There is no evidence of pericardial effusion. Mitral Valve: The mitral valve is normal in structure. No evidence of mitral valve regurgitation. No evidence of mitral valve stenosis by observation. Tricuspid Valve: The tricuspid valve is normal in structure. Tricuspid valve regurgitation is not demonstrated. Aortic Valve: The aortic valve is normal in structure. Aortic valve regurgitation is not visualized. The aortic valve is structurally normal, with no evidence of sclerosis or stenosis. Pulmonic Valve: The pulmonic valve was normal in structure. Pulmonic valve regurgitation is not visualized. Pulmonic regurgitation is not visualized. Aorta: The aortic root is normal in size and structure. Venous: The inferior vena cava is normal in size with greater than 50% respiratory variability, suggesting right atrial pressure of 3 mmHg. IAS/Shunts: The interatrial septum was not well visualized. Additional Comments: No evidence of valvular vegetations on this transthoracic echocardiogram. Would recommend a transesophageal echocardiogram to exclude infective endocarditis if clinically indicated.  LEFT VENTRICLE PLAX 2D LVIDd:         5.00 cm  Diastology LVIDs:         3.10 cm  LV e' lateral: 14.10 cm/s LV PW:         1.10 cm  LV e' medial:  12.30 cm/s LV IVS:        0.80 cm LVOT diam:     2.40 cm LV SV:         80 ml LV SV Index:   35.02 LVOT  Area:     4.52 cm  RIGHT VENTRICLE RV S prime:     27.10 cm/s TAPSE (M-mode): 2.8 cm LEFT ATRIUM           Index LA diam:      3.40 cm 1.50 cm/m LA Vol (A4C): 38.8 ml 17.08 ml/m  AORTIC VALVE LVOT Vmax:   105.00 cm/s LVOT Vmean:  74.200 cm/s LVOT VTI:    0.167 m  AORTA Ao Root diam: 3.60 cm  SHUNTS Systemic VTI:  0.17 m Systemic Diam: 2.40 cm  Cherlynn Kaiser MD Electronically signed by Cherlynn Kaiser MD Signature Date/Time: 04/19/2019/7:59:29 PM    Final (Updated)         Scheduled Meds: . Chlorhexidine Gluconate Cloth  6 each Topical Daily  . pantoprazole (PROTONIX) IV  40 mg Intravenous Q12H  . polyethylene glycol  17 g Oral Daily  . sodium chloride flush  3 mL Intravenous Q12H   Continuous Infusions: . sodium chloride 50 mL/hr at 04/16/19 1200  . sodium chloride 100 mL/hr at 04/18/19 2231  . sodium chloride 100 mL/hr at 04/21/19 0424  . ceFEPime (MAXIPIME) IV 2 g (04/21/19 0527)  . heparin 2,600 Units/hr (04/21/19 0221)  . metronidazole 500 mg (04/21/19 1052)     LOS: 10 days     Georgette Shell, MD [facility] If 7PM-7AM, please contact night-coverage [email or web address] Password [email or web address] 04/21/2019, 12:46 PM

## 2019-04-21 NOTE — Progress Notes (Addendum)
HEMATOLOGY-ONCOLOGY PROGRESS NOTE  SUBJECTIVE: Still having intermittent fevers.  Had a fever up to 101.5 earlier today.  Starting clear liquids today.  Still with left lower extremity edema and pain which has improved.  No nausea or vomiting.  No bleeding.  The remainder of review of systems is negative.  I have reviewed the past medical history, past surgical history, social history and family history with the patient and they are unchanged from previous note.   PHYSICAL EXAMINATION:  Vitals:   04/21/19 0634 04/21/19 1044  BP: (!) 150/98 135/82  Pulse: (!) 115 (!) 124  Resp: (!) 25 20  Temp: 99.1 F (37.3 C) (!) 101.5 F (38.6 C)  SpO2: 98% 99%   Filed Weights   04/10/19 2005 04/18/19 0600  Weight: 200 lb (90.7 kg) 216 lb 14.9 oz (98.4 kg)    Intake/Output from previous day: 02/04 0701 - 02/05 0700 In: 7764.3 [I.V.:5747.9; IV Piggyback:2016.4] Out: 1625 [Urine:1625]  GENERAL:alert, no distress and comfortable LEGS: Mild left lower extremity edema, no tenderness, or skin erythema, no edema on right leg  Abdomen: soft, nontender   LABORATORY DATA:  I have reviewed the data as listed CMP Latest Ref Rng & Units 04/21/2019 04/20/2019 04/19/2019  Glucose 70 - 99 mg/dL 107(H) 116(H) 107(H)  BUN 6 - 20 mg/dL '11 8 6  ' Creatinine 0.61 - 1.24 mg/dL 1.33(H) 1.01 0.95  Sodium 135 - 145 mmol/L 137 136 134(L)  Potassium 3.5 - 5.1 mmol/L 3.5 3.4(L) 3.9  Chloride 98 - 111 mmol/L 103 106 102  CO2 22 - 32 mmol/L 20(L) 19(L) 22  Calcium 8.9 - 10.3 mg/dL 7.7(L) 7.6(L) 7.6(L)  Total Protein 6.5 - 8.1 g/dL 5.9(L) 6.1(L) 6.1(L)  Total Bilirubin 0.3 - 1.2 mg/dL 1.1 1.2 1.0  Alkaline Phos 38 - 126 U/L 69 78 84  AST 15 - 41 U/L 35 33 30  ALT 0 - 44 U/L '28 26 24    ' Lab Results  Component Value Date   WBC 10.1 04/21/2019   HGB 7.0 (L) 04/21/2019   HCT 21.1 (L) 04/21/2019   MCV 89.8 04/21/2019   PLT 453 (H) 04/21/2019   NEUTROABS 6.3 04/20/2019    DG Abd 1 View  Result Date:  04/21/2019 CLINICAL DATA:  Ileus, history of IVC stent placement EXAM: ABDOMEN - 1 VIEW COMPARISON:  04/20/2019 FINDINGS: Supine frontal view of the abdomen and pelvis excludes the left flank and lower pelvis by collimation. There is progressive gaseous distension of large and small bowel. IVC stent unchanged. No masses or abnormal calcifications. IMPRESSION: Progressive gaseous distension of large and small bowel consistent with ileus. Electronically Signed   By: Randa Ngo M.D.   On: 04/21/2019 08:42   DG Abd 1 View  Result Date: 04/20/2019 CLINICAL DATA:  Diffuse abdominal tenderness, abdominal distension EXAM: ABDOMEN - 1 VIEW COMPARISON:  04/19/2019 FINDINGS: 2 supine frontal views of the abdomen and pelvis demonstrate persistent diffuse gaseous distention of the colon likely postoperative ileus. No masses or abnormal calcifications. Venous stent again noted. IMPRESSION: 1. Continued postoperative ileus. Electronically Signed   By: Randa Ngo M.D.   On: 04/20/2019 15:10   DG Abd 1 View  Result Date: 04/19/2019 CLINICAL DATA:  Evaluate ileus. EXAM: ABDOMEN - 1 VIEW COMPARISON:  April 19, 2019 (acquired at 8:04 a.m. FINDINGS: Numerous dilated air-filled loops of large bowel are again seen. This is unchanged in appearance when compared to the prior study. The visualized small bowel loops are normal in caliber. A radiopaque  stent is seen overlying the medial aspect of the mid right abdomen. No radio-opaque calculi or other significant radiographic abnormality are seen. IMPRESSION: 1. Stable findings likely consistent with a postoperative ileus, unchanged in appearance when compared to the prior study dated April 19, 2019 (acquired at 8:04 a.m.). Electronically Signed   By: Virgina Norfolk M.D.   On: 04/19/2019 20:28   DG Abd 1 View  Result Date: 04/19/2019 CLINICAL DATA:  Generalized abdominal pain, postoperative ileus. EXAM: ABDOMEN - 1 VIEW COMPARISON:  April 18, 2019. FINDINGS: Stable  dilated air-filled colon is noted most consistent with postoperative ileus. No significant small bowel dilatation is noted. Phleboliths are noted in the pelvis. IVC stent is again noted. IMPRESSION: Stable dilated air-filled colon is noted most consistent with postoperative ileus. Electronically Signed   By: Marijo Conception M.D.   On: 04/19/2019 09:14   CT ANGIO CHEST PE W OR WO CONTRAST  Result Date: 04/19/2019 CLINICAL DATA:  Hypoxia. EXAM: CT ANGIOGRAPHY CHEST WITH CONTRAST TECHNIQUE: Multidetector CT imaging of the chest was performed using the standard protocol during bolus administration of intravenous contrast. Multiplanar CT image reconstructions and MIPs were obtained to evaluate the vascular anatomy. CONTRAST:  124m OMNIPAQUE IOHEXOL 300 MG/ML SOLN, 830mOMNIPAQUE IOHEXOL 350 MG/ML SOLN COMPARISON:  04/11/2019 FINDINGS: Cardiovascular: Evaluation for acute pulmonary emboli is severely limited by respiratory motion artifact and suboptimal contrast bolus timing. Given these limitations, no large centrally located pulmonary embolism was detected.Detection of smaller pulmonary emboli is not possible on this exam. The main pulmonary artery is within normal limits for size. There is no CT evidence of acute right heart strain. The visualized aorta is normal. Heart size is normal, without pericardial effusion. Mediastinum/Nodes: --No mediastinal or hilar lymphadenopathy. --No axillary lymphadenopathy. --there is an enlarged left supraclavicular lymph node measuring 1.4 cm (axial series 5, image 5). --Normal thyroid gland. --The esophagus is unremarkable Lungs/Pleura: There is atelectasis at the lung bases bilaterally. There are new small bilateral pleural effusions, left greater than right. Upper Abdomen: No acute abnormality. Musculoskeletal: No chest wall abnormality. No acute or significant osseous findings. Review of the MIP images confirms the above findings. IMPRESSION: 1. Evaluation for acute pulmonary  emboli is severely limited by respiratory motion artifact and suboptimal contrast bolus timing. Given these limitations, no large centrally located pulmonary embolism was detected. 2. New small bilateral pleural effusions, left greater than right. 3. Enlarged left supraclavicular lymph node measuring 1.4 cm. This may be malignant or reactive in etiology. It is amenable to percutaneous biopsy as clinically indicated. Electronically Signed   By: ChConstance Holster.D.   On: 04/19/2019 23:17   CT Angio Chest PE W and/or Wo Contrast  Result Date: 04/11/2019 CLINICAL DATA:  Positive lower extremity DVT, PE suspected. Back pain and leg swelling, history of testicular cancer. EXAM: CT ANGIOGRAPHY CHEST CT ABDOMEN AND PELVIS WITH CONTRAST TECHNIQUE: Multidetector CT imaging of the chest was performed using the standard protocol during bolus administration of intravenous contrast. Multiplanar CT image reconstructions and MIPs were obtained to evaluate the vascular anatomy. Multidetector CT imaging of the abdomen and pelvis was performed using the standard protocol during bolus administration of intravenous contrast. CONTRAST:  10037mMNIPAQUE IOHEXOL 350 MG/ML SOLN COMPARISON:  None. FINDINGS: CTA CHEST FINDINGS Cardiovascular: Suboptimal opacification of the pulmonary arteries limits evaluation beyond the lobar level. No large central or lobar pulmonary arterial filling defects are identified. Central pulmonary arteries are normal caliber. Normal cardiac size. Slight mass effect on the right  heart by a mild pectus deformity of the chest wall with a Haller index of 3.2. The aorta is normal caliber. Shared origin of the brachiocephalic and left common carotid arteries. Great vessels are otherwise unremarkable. Mediastinum/Nodes: No enlarged mediastinal, hilar or axillary adenopathy is seen. No acute abnormality of the trachea or esophagus. Thyroid gland is normal. Asymmetric thickening of the left sternocleidomastoid is  likely related to muscle activation. Lungs/Pleura: Respiratory motion artifact is mild. No consolidation, features of edema, pneumothorax, or effusion. No suspicious pulmonary nodules or masses. Musculoskeletal: No acute osseous abnormality or suspicious osseous lesion in the imaged chest. No worrisome chest wall lesions. Mild bilateral gynecomastia is noted, right slightly greater left. Review of the MIP images confirms the above findings. CT ABDOMEN and PELVIS FINDINGS Hepatobiliary: No focal liver abnormality is seen. No gallstones, gallbladder wall thickening, or biliary dilatation. Pancreas: Unremarkable. No pancreatic ductal dilatation or surrounding inflammatory changes. Spleen: Normal in size without focal abnormality. Adrenals/Urinary Tract: Adrenal glands are unremarkable. Kidneys are normal, without renal calculi, focal lesion, or hydronephrosis. Bladder is unremarkable. Stomach/Bowel: Distal esophagus, stomach and duodenal sweep are unremarkable. No small bowel wall thickening or dilatation. No evidence of obstruction. A normal appendix is visualized. No colonic dilatation or wall thickening. Scattered colonic diverticula without focal pericolonic inflammation to suggest diverticulitis. Vascular/Lymphatic: There is extensive retroperitoneal and pelvic adenopathy, some of the larger nodes include a left periaortic node measuring up to 3.2 cm (2/43) and a 2 cm aortocaval lymph node (2/35) there is distal abdominal aortic encasement by a extensive retroperitoneal adenopathy. Furthermore there is likely direct compression and/or direct nodal involvement of the inferior vena cava (2/40 with the lower IVC appearing expanded by hypoattenuating filling defects. Edematous changes are noted throughout the pelvis with venous engorgement and collateralization. Reproductive: The prostate and seminal vesicles are unremarkable. There is a varicocele seen in the left scrotum. External genitalia are incompletely included  on this exam. Other: There is circumferential body wall edema from the level of the pelvis inferiorly more pronounced throughout the left lower extremity. Free fluid is seen in the deep pelvis, likely reactive or secondary to venous occlusion with developing collateralization. Musculoskeletal: No acute osseous abnormality or suspicious osseous lesion. Normal symmetric muscular enhancement. Review of the MIP images confirms the above findings. IMPRESSION: 1. Suboptimal opacification of the pulmonary arteries limits evaluation beyond the lobar level. No large central or lobar pulmonary arterial filling defects are identified. 2. Extensive retroperitoneal and pelvic adenopathy. There is likely direct compression of the lower IVC with the distal IVC and iliac veins appearing expanded by hypoattenuating thrombus. There is complete encasement of the abdominal aorta as well but without luminal narrowing. Findings are highly concerning for metastatic disease, particularly given patient history of testicular cancer. 3. Left scrotal varicocele. External genitalia are incompletely included on this exam. Consider further evaluation scrotal ultrasound given extensive retroperitoneal adenopathy and patient's history of testicular cancer. 4. Free fluid in the deep pelvis is likely reactive or secondary to venous occlusion with developing collateralization. 5. Colonic diverticulosis without evidence of diverticulitis. 6. Mild bilateral gynecomastia, right slightly greater left. 7. 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 Critical Value/emergent results were called by telephone at the time of interpretation on 04/11/2019 at 1:33 am to [place] , who verbally acknowledged these results. Electronically Signed   By: Lovena Le M.D.   On: 04/11/2019 01:33   MR BRAIN W  WO CONTRAST  Result Date: 04/15/2019 CLINICAL DATA:  History of testicular cancer.  Assess for metastatic disease. EXAM: MRI HEAD WITHOUT AND WITH CONTRAST TECHNIQUE: Multiplanar, multiecho pulse sequences of the brain and surrounding structures were obtained without and with intravenous contrast. CONTRAST:  15m GADAVIST GADOBUTROL 1 MMOL/ML IV SOLN COMPARISON:  None. FINDINGS: Brain: The brain has a normal appearance without evidence of malformation, atrophy, old or acute small or large vessel infarction, mass lesion, hemorrhage, hydrocephalus or extra-axial collection. After contrast administration, no abnormal enhancement occurs. Vascular: Major vessels at the base of the brain show flow. Venous sinuses appear patent. Skull and upper cervical spine: Normal. Sinuses/Orbits: Clear/normal. Other: None significant. IMPRESSION: Normal examination.  No evidence of metastatic disease. Electronically Signed   By: MNelson ChimesM.D.   On: 04/15/2019 11:07   UKoreaScrotum  Result Date: 04/11/2019 CLINICAL DATA:  Concern for testicular cancer EXAM: ULTRASOUND OF SCROTUM TECHNIQUE: Complete ultrasound examination of the testicles, epididymis, and other scrotal structures was performed. COMPARISON:  None. FINDINGS: Right testicle Measurements: Surgically removed. Left testicle Measurements: 3.9 x 2.2 x 2.6 cm. No mass or microlithiasis visualized. Right epididymis:  Not visualized Left epididymis:  Normal in size and appearance. Hydrocele:  Moderate left hydrocele Varicocele:  Moderate left varicocele. IMPRESSION: No left testicular abnormality.  Prior right orchiectomy. Moderate left hydrocele and varicocele. Electronically Signed   By: KRolm BaptiseM.D.   On: 04/11/2019 02:16   CT ABDOMEN PELVIS W CONTRAST  Result Date: 04/18/2019 CLINICAL DATA:  Testicular carcinoma post RIGHT orchiectomy, LEFT lower extremity DVT, abdominal distension, recent CT with intra-abdominal and retroperitoneal lymphadenopathy concerning for metastatic cancer EXAM: CT ABDOMEN AND PELVIS WITH CONTRAST TECHNIQUE: Multidetector  CT imaging of the abdomen and pelvis was performed using the standard protocol following bolus administration of intravenous contrast. Sagittal and coronal MPR images reconstructed from axial data set. CONTRAST:  1064mOMNIPAQUE IOHEXOL 300 MG/ML SOLN IV. No oral contrast. COMPARISON:  04/11/2019 FINDINGS: Lower chest: Dependent bibasilar atelectasis in the lower lobes Hepatobiliary: New dependent density within gallbladder question vicarious excretion of contrast material from prior CT exam versus sludge. No gallbladder wall thickening. Liver unremarkable. Pancreas: Normal appearance Spleen: Normal appearance Adrenals/Urinary Tract: Adrenal glands, kidneys, and ureters normal appearance. Foley catheter and air within urinary bladder. Stomach/Bowel: Retrocecal appendix, normal. Gaseous distention of colon from cecum to descending colon. Small amount of stool in fluid within proximal colon. Tapering of the distal colon with few scattered distal colonic diverticula but no evidence of diverticulitis. Rectosigmoid colon decompressed. No discrete point of obstruction seen; findings favor ileus. Stomach and small bowel loops unremarkable Vascular/Lymphatic: Prior stenting of the IVC. Aorta normal caliber without aneurysm. IVC, iliac veins, common femoral veins patent without thrombus. Previously identified thrombus LEFT external iliac vein no longer seen. Again identified significant retroperitoneal adenopathy unchanged. Reproductive: Seminal vesicles unremarkable. Minimal prostatic prominence for age. Other: Scattered stranding of retroperitoneal tissue planes increased from previous exam. No free air or free fluid. Tiny LEFT inguinal hernia containing fat Musculoskeletal: Osseous structures unremarkable. New enlargement of the LEFT psoas muscle with in ill-defined area of central low attenuation approximately 3.7 x 3.9 cm in axial dimensions extending 12 cm length, could represent LEFT psoas abscess or hematoma. This is  new since 04/11/2019 making tumor infiltration unlikely. IMPRESSION: Mild gaseous distention of the colon through descending colon with decompressed rectosigmoid colon; no point of obstruction is identified, favor ileus. Persistent retroperitoneal adenopathy suspicious for metastatic disease. New ill-defined fluid collection within LEFT psoas  muscle 3.7 x 3.9 x 12 cm question psoas abscess versus hematoma; LEFT psoas muscle was traversed by prior CT guided retroperitoneal lymph node biopsy. Increased retroperitoneal and mesenteric stranding. Bibasilar atelectasis. Findings called to Dr. Zigmund Daniel on 04/18/2019 at 1108 hours. Electronically Signed   By: Lavonia Dana M.D.   On: 04/18/2019 11:08   CT ABDOMEN PELVIS W CONTRAST  Result Date: 04/11/2019 CLINICAL DATA:  Positive lower extremity DVT, PE suspected. Back pain and leg swelling, history of testicular cancer. EXAM: CT ANGIOGRAPHY CHEST CT ABDOMEN AND PELVIS WITH CONTRAST TECHNIQUE: Multidetector CT imaging of the chest was performed using the standard protocol during bolus administration of intravenous contrast. Multiplanar CT image reconstructions and MIPs were obtained to evaluate the vascular anatomy. Multidetector CT imaging of the abdomen and pelvis was performed using the standard protocol during bolus administration of intravenous contrast. CONTRAST:  114m OMNIPAQUE IOHEXOL 350 MG/ML SOLN COMPARISON:  None. FINDINGS: CTA CHEST FINDINGS Cardiovascular: Suboptimal opacification of the pulmonary arteries limits evaluation beyond the lobar level. No large central or lobar pulmonary arterial filling defects are identified. Central pulmonary arteries are normal caliber. Normal cardiac size. 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 The aorta is normal caliber. Shared origin of the brachiocephalic and left common carotid arteries. Great vessels are otherwise unremarkable. Mediastinum/Nodes:  No enlarged mediastinal, hilar or axillary adenopathy is seen. No acute abnormality of the trachea or esophagus. Thyroid gland is normal. Asymmetric thickening of the left sternocleidomastoid is likely related to muscle activation. Lungs/Pleura: Respiratory motion artifact is mild. No consolidation, features of edema, pneumothorax, or effusion. No suspicious pulmonary nodules or masses. Musculoskeletal: No acute osseous abnormality or suspicious osseous lesion in the imaged chest. No worrisome chest wall lesions. Mild bilateral gynecomastia is noted, right slightly greater left. Review of the MIP images confirms the above findings. CT ABDOMEN and PELVIS FINDINGS Hepatobiliary: No focal liver abnormality is seen. No gallstones, gallbladder wall thickening, or biliary dilatation. Pancreas: Unremarkable. No pancreatic ductal dilatation or surrounding inflammatory changes. Spleen: Normal in size without focal abnormality. Adrenals/Urinary Tract: Adrenal glands are unremarkable. Kidneys are normal, without renal calculi, focal lesion, or hydronephrosis. Bladder is unremarkable. Stomach/Bowel: Distal esophagus, stomach and duodenal sweep are unremarkable. No small bowel wall thickening or dilatation. No evidence of obstruction. A normal appendix is visualized. No colonic dilatation or wall thickening. Scattered colonic diverticula without focal pericolonic inflammation to suggest diverticulitis. Vascular/Lymphatic: There is extensive retroperitoneal and pelvic adenopathy, some of the larger nodes include a left periaortic node measuring up to 3.2 cm (2/43) and a 2 cm aortocaval lymph node (2/35) there is distal abdominal aortic encasement by a extensive retroperitoneal adenopathy. Furthermore there is likely direct compression and/or direct nodal involvement of the inferior vena cava (2/40 with the lower IVC appearing expanded by hypoattenuating filling defects. Edematous changes are noted throughout the pelvis with  venous engorgement and collateralization. Reproductive: The prostate and seminal vesicles are unremarkable. There is a varicocele seen in the left scrotum. External genitalia are incompletely included on this exam. Other: There is circumferential body wall edema from the level of the pelvis inferiorly more pronounced throughout the left lower extremity. Free fluid is seen in the deep pelvis, likely reactive or secondary to venous occlusion with developing collateralization. Musculoskeletal: No acute osseous abnormality or suspicious osseous lesion. Normal symmetric muscular enhancement. Review of the MIP images confirms the above findings. IMPRESSION: 1. Suboptimal opacification of the pulmonary  arteries limits evaluation beyond the lobar level. No large central or lobar pulmonary arterial filling defects are identified. 2. Extensive retroperitoneal and pelvic adenopathy. There is likely direct compression of the lower IVC with the distal IVC and iliac veins appearing expanded by hypoattenuating thrombus. There is complete encasement of the abdominal aorta as well but without luminal narrowing. Findings are highly concerning for metastatic disease, particularly given patient history of testicular cancer. 3. Left scrotal varicocele. External genitalia are incompletely included on this exam. Consider further evaluation scrotal ultrasound given extensive retroperitoneal adenopathy and patient's history of testicular cancer. 4. Free fluid in the deep pelvis is likely reactive or secondary to venous occlusion with developing collateralization. 5. Colonic diverticulosis without evidence of diverticulitis. 6. Mild bilateral gynecomastia, right slightly greater left. 7. 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 Critical Value/emergent results were called by telephone at the time of interpretation on 04/11/2019 at 1:33 am to [place] , who verbally  acknowledged these results. Electronically Signed   By: Lovena Le M.D.   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 Patient has experienced minimal improvement since initiation of IV heparin, and as such request made for initiation of catheter directed thrombolysis for symptomatic purposes. EXAM: 1. ULTRASOUND GUIDANCE FOR VASCULAR ACCESS X2 2. FLUOROSCOPIC GUIDED PHARMACOLOGICAL AND MECHANICAL THROMBECTOMY WITH ANGIOJET DEVICE 3. BILATERAL LOWER EXTREMITY VENOGRAM AND PLACEMENT OF INFUSION CATHETERS COMPARISON:  CT chest, abdomen pelvis-04/11/2019; abdominal MRV - 04/13/2019 MEDICATIONS: Hydralazine 10 mg IV, administered near the completion of the procedure secondary to persistently elevated blood pressure. CONTRAST:  60 cc Omnipaque 300 ANESTHESIA/SEDATION: Moderate (conscious) sedation was employed during this procedure. A total of Versed 3 mg and Fentanyl 125 mcg was administered intravenously. Moderate Sedation Time: 67 minutes. The patient's level of consciousness and vital signs were monitored continuously by radiology nursing throughout the procedure under my direct supervision. FLUOROSCOPY TIME:  18 minutes, 18 seconds (633 mGy) COMPLICATIONS: None immediate. TECHNIQUE: Informed written consent was obtained from the patient after a discussion of the risks (including but not limited to bleeding, including nontarget bleeding given history of malignancy, infection, pulmonary embolism and necessity for venous angioplasty and/or stent placement), benefits and alternatives to treatment. Questions regarding the procedure were encouraged and answered. A timeout was performed prior to the initiation of the procedure. The patient was placed prone on the fluoroscopy table and the skin  posterior to the bilateral knees was prepped and draped in the usual sterile fashion, and a sterile drape was applied covering the operative field. Maximum barrier sterile technique with sterile gowns and gloves were used for the procedure. Under direct ultrasound guidance, the left popliteal vein was access with a micropuncture kit after the overlying soft tissues were anesthetized with 1% lidocaine. An ultrasound image was saved for documentation purposed. This allowed for placement of a 8-French vascular sheath. A limited venogram was performed through the side arm of the vascular sheath. With the use of a regular glidewire, a Kumpe catheter was advanced through the left femoral, external iliac vein and through the left common iliac vein to the level of the IVC. Limited vena cavogram demonstrates complete occlusion of the distal aspect of the IVC extending to involve both left common iliac veins. Ultimately,  a moderate to long segment apparently extrinsic malignant narrowing involving the mid/distal aspect of the IVC was successfully traversed with inferior venogram demonstrating patency of the more cranial infrarenal IVC. Next, with the use of a Angiojet device, approximately 250 cc of a solution of 10 mg of tPA mixed in 500 mg of saline was infused throughout the occluded segment of the IVC, left common and external iliac veins to the level of the left common femoral vein. Attention was now paid towards access of the right lower extremity popliteal vein. After the overlying soft tissues were anesthetized with 1% lidocaine, the right popliteal vein was accessed micropuncture kit. Limited venogram was performed through the outer micro puncture sheath. This allowed for placement of a 6 French, 35 cm vascular sheath. Again, with the use of a regular glidewire, a Kumpe catheter was advanced beyond the obstructing caval lesion. Contrast injection confirmed appropriate positioning. Next, after approximately 20 minute  tPA dwell, mechanical thrombectomy was performed with the Angiojet device both from the left lower extremity access as well as the right lower extremity access. Limited vena cavagram was performed via the right lower extremity access. Kumpe catheter was utilized for measurement purposes and ultimately a 90 cm/20-cm infusion catheter was placed from the right lower extremity access and a 90 cm/30 cm infusion catheter was placed from the left lower extremity access adequately covering the occluded segment of the bilateral venous systems as well as the caudal aspect of the IVC. Postprocedural spot fluoroscopic images were obtained. Both popliteal vascular sheath were secured in place. Dressings were applied. Patient's was subjectively cold by the end of the procedure with associated elevation in his blood pressure and as such he was administered 10 mg of hydralazine and bear hugger warming device was applied. The patient otherwise tolerated the procedure well without immediate postprocedural complication. FINDINGS: Sonographic evaluation demonstrates patency of the bilateral popliteal veins. Contrast injection demonstrates patency of the bilateral popliteal and superficial femoral veins. There is occlusive thrombus extending from the left common femoral vein and the right common iliac vein through the mid/caudal aspect of the IVC secondary to a moderate to long segment length presumably extrinsic malignant occlusion secondary to known adjacent bulky retroperitoneal lymphadenopathy. Following pharmacological and mechanical thrombectomy as detailed above, there is restored flow as demonstrated on limited right central pelvic venogram with large amount of residual thrombus. Following infusion catheter placement, both side ports traverse both occluded segments of the IVC and pelvic venous systems. IMPRESSION: 1. 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 2. Successful initiation of bilateral mechanical and pharmacologic thrombectomy with bilateral infusion catheters appropriately positioned. PLAN: The patient will be transferred to ICU for overnight lytic infusion and will return tomorrow for repeat venogram and potential intervention. Electronically Signed   By: Sandi Mariscal M.D.   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 Patient has experienced minimal improvement since initiation of IV heparin, and as such request made for initiation of catheter directed thrombolysis for symptomatic purposes. EXAM: 1. ULTRASOUND GUIDANCE FOR VASCULAR ACCESS X2 2. FLUOROSCOPIC GUIDED PHARMACOLOGICAL AND MECHANICAL THROMBECTOMY WITH ANGIOJET DEVICE 3. BILATERAL LOWER EXTREMITY VENOGRAM AND PLACEMENT OF INFUSION CATHETERS COMPARISON:  CT chest, abdomen pelvis-04/11/2019; abdominal MRV -  04/13/2019 MEDICATIONS: Hydralazine 10 mg IV, administered near the completion of the procedure secondary to persistently elevated blood pressure. CONTRAST:  60 cc Omnipaque 300 ANESTHESIA/SEDATION: Moderate (conscious) sedation was employed during this procedure. A total of Versed 3 mg and Fentanyl 125 mcg was administered intravenously. Moderate Sedation Time: 67 minutes. The patient's level of consciousness and vital signs were monitored continuously by radiology nursing throughout the procedure under my direct supervision. FLUOROSCOPY TIME:  18 minutes, 18 seconds (818 mGy) COMPLICATIONS: None immediate. TECHNIQUE: Informed written consent was obtained from the patient after a discussion of the risks (including but not limited to bleeding, including nontarget bleeding given history of malignancy, infection,  pulmonary embolism and necessity for venous angioplasty and/or stent placement), benefits and alternatives to treatment. Questions regarding the procedure were encouraged and answered. A timeout was performed prior to the initiation of the procedure. The patient was placed prone on the fluoroscopy table and the skin posterior to the bilateral knees was prepped and draped in the usual sterile fashion, and a sterile drape was applied covering the operative field. Maximum barrier sterile technique with sterile gowns and gloves were used for the procedure. Under direct ultrasound guidance, the left popliteal vein was access with a micropuncture kit after the overlying soft tissues were anesthetized with 1% lidocaine. An ultrasound image was saved for documentation purposed. This allowed for placement of a 8-French vascular sheath. A limited venogram was performed through the side arm of the vascular sheath. With the use of a regular glidewire, a Kumpe catheter was advanced through the left femoral, external iliac vein and through the left common iliac vein to the level of the IVC. Limited vena cavogram demonstrates complete occlusion of the distal aspect of the IVC extending to involve both left common iliac veins. Ultimately, a moderate to long segment apparently extrinsic malignant narrowing involving the mid/distal aspect of the IVC was successfully traversed with inferior venogram demonstrating patency of the more cranial infrarenal IVC. Next, with the use of a Angiojet device, approximately 250 cc of a solution of 10 mg of tPA mixed in 500 mg of saline was infused throughout the occluded segment of the IVC, left common and external iliac veins to the level of the left common femoral vein. Attention was now paid towards access of the right lower extremity popliteal vein. After the overlying soft tissues were anesthetized with 1% lidocaine, the right popliteal vein was accessed micropuncture kit. Limited venogram was  performed through the outer micro puncture sheath. This allowed for placement of a 6 French, 35 cm vascular sheath. Again, with the use of a regular glidewire, a Kumpe catheter was advanced beyond the obstructing caval lesion. Contrast injection confirmed appropriate positioning. Next, after approximately 20 minute tPA dwell, mechanical thrombectomy was performed with the Angiojet device both from the left lower extremity access as well as the right lower extremity access. Limited vena cavagram was performed via the right lower extremity access. Kumpe catheter was utilized for measurement purposes and ultimately a 90 cm/20-cm infusion catheter was placed from the right lower extremity access and a 90 cm/30 cm infusion catheter was placed from the left lower extremity access adequately covering the occluded segment of the bilateral venous systems as well as the caudal aspect of the IVC. Postprocedural spot fluoroscopic images were obtained. Both popliteal vascular sheath were secured in place. Dressings were applied. Patient's was subjectively cold by the end of the procedure with associated elevation in his blood pressure and as such he was administered  10 mg of hydralazine and bear hugger warming device was applied. The patient otherwise tolerated the procedure well without immediate postprocedural complication. FINDINGS: Sonographic evaluation demonstrates patency of the bilateral popliteal veins. Contrast injection demonstrates patency of the bilateral popliteal and superficial femoral veins. There is occlusive thrombus extending from the left common femoral vein and the right common iliac vein through the mid/caudal aspect of the IVC secondary to a moderate to long segment length presumably extrinsic malignant occlusion secondary to known adjacent bulky retroperitoneal lymphadenopathy. Following pharmacological and mechanical thrombectomy as detailed above, there is restored flow as demonstrated on limited right  central pelvic venogram with large amount of residual thrombus. Following infusion catheter placement, both side ports traverse both occluded segments of the IVC and pelvic venous systems. IMPRESSION: 1. 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 2. Successful initiation of bilateral mechanical and pharmacologic thrombectomy with bilateral infusion catheters appropriately positioned. PLAN: The patient will be transferred to ICU for overnight lytic infusion and will return tomorrow for repeat venogram and potential intervention. Electronically Signed   By: Sandi Mariscal M.D.   On: 04/15/2019 17:04   US RENAL  Result Date: 04/17/2019 CLINICAL DATA:  Urinary retention.  History of testicular carcinoma. EXAM: RENAL / URINARY TRACT ULTRASOUND COMPLETE COMPARISON:  CT scan 04/11/2019 FINDINGS: Right Kidney: Renal measurements: 13.5 x 5.5 x 6.6 cm = volume: 260.0 mL . Normal renal cortical thickness and echogenicity without focal lesions or hydronephrosis. Left Kidney: Renal measurements: 12.0 x 6.8 x 5.4 cm = volume: 229.4 mL. Normal renal cortical thickness and echogenicity without focal lesions or hydronephrosis. Bladder: Decompressed by Foley catheter. Other: None. IMPRESSION: Normal sonographic appearance of both kidneys.  No hydronephrosis. Bladder decompressed by a Foley catheter. Electronically Signed   By: Marijo Sanes M.D.   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  Patient has experienced minimal improvement since initiation of IV heparin, and as such request made for initiation of catheter directed thrombolysis for symptomatic purposes.  EXAM: 1.  ULTRASOUND GUIDANCE FOR VASCULAR ACCESS X2 2. FLUOROSCOPIC GUIDED PHARMACOLOGICAL AND MECHANICAL THROMBECTOMY WITH ANGIOJET DEVICE 3. BILATERAL LOWER EXTREMITY VENOGRAM AND PLACEMENT OF INFUSION CATHETERS  COMPARISON:  CT chest, abdomen pelvis-04/11/2019; abdominal MRV - 04/13/2019  MEDICATIONS: Hydralazine 10 mg IV, administered near the completion of the procedure secondary to persistently elevated blood pressure.  CONTRAST:  60 cc Omnipaque 300  ANESTHESIA/SEDATION: Moderate (conscious) sedation was employed during this procedure. A total of Versed 3 mg and Fentanyl 125 mcg was administered intravenously.  Moderate Sedation Time: 67 minutes. The patient's level of consciousness and vital signs were monitored continuously by radiology nursing throughout the procedure under my direct supervision.  FLUOROSCOPY TIME:  18 minutes, 18 seconds (166 mGy)  COMPLICATIONS: None immediate.  TECHNIQUE: Informed written consent was obtained from the patient after a discussion of the risks (including but not limited to bleeding, including nontarget bleeding given history of malignancy, infection, pulmonary embolism and necessity for venous angioplasty and/or stent placement), benefits and alternatives to treatment. Questions regarding the procedure were encouraged and answered. A timeout was performed prior to the initiation of the procedure.  The patient was placed prone on the fluoroscopy table and the  skin posterior to the bilateral knees was prepped and draped in the usual sterile fashion, and a sterile drape was applied covering the operative field. Maximum barrier sterile technique with sterile gowns and gloves were used for the procedure.  Under direct ultrasound guidance, the left popliteal vein was access with a micropuncture kit after the overlying soft tissues were anesthetized with 1% lidocaine. An ultrasound image was saved for documentation purposed. This allowed for placement of a 8-French vascular  sheath. A limited venogram was performed through the side arm of the vascular sheath.  With the use of a regular glidewire, a Kumpe catheter was advanced through the left femoral, external iliac vein and through the left common iliac vein to the level of the IVC.  Limited vena cavogram demonstrates complete occlusion of the distal aspect of the IVC extending to involve both left common iliac veins.  Ultimately, a moderate to long segment apparently extrinsic malignant narrowing involving the mid/distal aspect of the IVC was successfully traversed with inferior venogram demonstrating patency of the more cranial infrarenal IVC.  Next, with the use of a Angiojet device, approximately 250 cc of a solution of 10 mg of tPA mixed in 500 mg of saline was infused throughout the occluded segment of the IVC, left common and external iliac veins to the level of the left common femoral vein.  Attention was now paid towards access of the right lower extremity popliteal vein.  After the overlying soft tissues were anesthetized with 1% lidocaine, the right popliteal vein was accessed micropuncture kit. Limited venogram was performed through the outer micro puncture sheath. This allowed for placement of a 6 French, 35 cm vascular sheath.  Again, with the use of a regular glidewire, a Kumpe catheter was advanced beyond the obstructing caval lesion. Contrast injection confirmed appropriate positioning.  Next, after approximately 20 minute tPA dwell, mechanical thrombectomy was performed with the Angiojet device both from the left lower extremity access as well as the right lower extremity access.  Limited vena cavagram was performed via the right lower extremity access.  Kumpe catheter was utilized for measurement purposes and ultimately a 90 cm/20-cm infusion catheter was placed from the right lower extremity access and a 90 cm/30 cm infusion catheter was placed from the left lower extremity access adequately covering the  occluded segment of the bilateral venous systems as well as the caudal aspect of the IVC.  Postprocedural spot fluoroscopic images were obtained.  Both popliteal vascular sheath were secured in place. Dressings were applied.  Patient's was subjectively cold by the end of the procedure with associated elevation in his blood pressure and as such he was administered 10 mg of hydralazine and bear hugger warming device was applied.  The patient otherwise tolerated the procedure well without immediate postprocedural complication.  FINDINGS: Sonographic evaluation demonstrates patency of the bilateral popliteal veins.  Contrast injection demonstrates patency of the bilateral popliteal and superficial femoral veins.  There is occlusive thrombus extending from the left common femoral vein and the right common iliac vein through the mid/caudal aspect of the IVC secondary to a moderate to long segment length presumably extrinsic malignant occlusion secondary to known adjacent bulky retroperitoneal lymphadenopathy.  Following pharmacological and mechanical thrombectomy as detailed above, there is restored flow as demonstrated on limited right central pelvic venogram with large amount of residual thrombus.  Following infusion catheter placement, both side ports traverse both occluded segments of the IVC and pelvic venous systems.  IMPRESSION: 1. Malignant occlusion of the mid/distal  aspect of the IVC with occlusive thrombus extending to involve the right common iliac and the left common femoral veins. 2. Successful initiation of bilateral mechanical and pharmacologic thrombectomy with bilateral infusion catheters appropriately positioned.  PLAN: The patient will be transferred to ICU for overnight lytic infusion and will return tomorrow for repeat venogram and potential intervention.   Electronically Signed   By: Sandi Mariscal M.D.   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  Patient was initiated bilateral pelvic/IVC catheter directed thrombolysis day prior (04/15/2019), and returns today following 24 hours of tPA infusion.  EXAM: 1. IR THROMB F/U EVAL ART/VEN FINAL DAY 2. FLUOROSCOPIC GUIDED ANGIOPLASTY AND STENT PLACEMENT OF THE IVC  COMPARISON:  Initiation of bilateral catheter directed pelvic venous and IVC thrombolysis - 04/15/2019; CT abdomen pelvis - 04/11/2019  MEDICATIONS: Heparin 7000 units  CONTRAST:  85 cc Omnipaque 300  ANESTHESIA/SEDATION: Moderate (conscious) sedation was employed during this procedure. A total of Versed 2 mg and Dilaudid 3 mg was administered intravenously.  Moderate Sedation Time: 128 minutes. The patient's level of consciousness and vital signs were monitored continuously by radiology nursing throughout the procedure under my direct supervision.  FLUOROSCOPY TIME:  14 minutes, 12 seconds (086.7 mGy)  COMPLICATIONS: None immediate.  TECHNIQUE: Informed written consent was obtained from the patient after a discussion of the risks, benefits and alternatives to treatment. Questions regarding the procedure were encouraged and answered. A timeout was performed prior to the initiation of the procedure.  The patient was placed prone on the fluoroscopy table and the skin posterior to the bilateral knees as well as the external portion the existing bilateral vascular sheaths and infusion catheters was prepped and draped in usual sterile fashion. The surrounding subcutaneous tissues about both popliteal approach vascular sheath was anesthetized with 1% lidocaine with epinephrine.  Next, post lysis venograms were performed from the bilateral infusion catheters following removal of the bilateral infusion wires.  Images were reviewed and  the decision was made to proceed with additional mechanical thrombectomy.  With the use of a vertebral catheter exchange length stiff Glidewires were advanced from both existing popliteal vascular sheaths superior to the nearly obstructing caval lesion. Contrast injection confirmed appropriate positioning. Stiff glide wires were advanced into the left subclavian vein.  4000 units of heparin was administered intravenously.  Next, beginning with the left lower extremity, the existing 8 French vascular sheath was exchanged over the glidewire for a 13 [place] vascular sheath.  The 16 mm diameter [place]ottre[place] Inari mechanical thrombectomy device was then utilized to perform 3 passes from the left popliteal approach from at and just below level of the obstructing caval lesion. Post thrombectomy images were obtained from the left lower extremity.  Next, the existing right lower extremity 6 French popliteal vein vascular sheath was exchanged over a guidewire for a 13 French vascular sheath. Again, from the right lower extremity approach, the 16 mm diameter Clottre[place]ver Inari mechanical thrombectomy device was then utilized to perform 3 passes from the level at and just below the obstructing caval lesion. Post thrombectomy images were obtained from the right lower extremity.  An ACT level was obtained and an additional 3000 units of heparin was administered intravenously.  The long segment eccentric irregular narrowing/subtotal occlusion of the IVC was subsequently balloon angioplastied  at multiple stations with a 14 mm x 4 cm Atlas balloon. Despite adequate balloon apposition, post angioplasty venogram images demonstrate a recurrent hemodynamically significant narrowing.  As such, a 20 mm x 60 mm Venovo venous stent was deployed across the superior and mid aspect of the caval lesion and was subsequently balloon angioplastied at multiple stations initially with a 16 mm x 4 cm atlas balloon and ultimately with an 18 mm  x 4 cm balloon.  Post stent deployment venogram images were obtained however there is felt to be incomplete coverage of the distal aspect of the malignant narrowing. As such, an additional 20 mm x 40 mm Venovo venous stent was deployed overlapping the mid/caudal aspect of venous stent and adequately covering the caudal aspect caval lesion.  Both overlapping venous stents were subsequently balloon angioplastied at multiple stations with the 18 mm atlas balloon and completion venogram images were obtained from both lower extremities. Images were reviewed and the procedure was terminated.  All wires, catheters and sheaths were removed from the bilateral popliteal vein accesses and hemostasis was achieved with manual compression. Dressings applied. The patient tolerated the procedure well without immediate postprocedural complication.  FINDINGS: Initial post lysis venogram performed from the left lower extremity infusion catheter demonstrates restoration of flow through the left pelvis with moderate amount residual thrombus primarily within the peripheral aspect of the left external iliac vein as well as the left common and central aspect of the left superficial femoral veins. There is passage of contrast beyond the nearly obstructing lesion involving mid/caudal aspect of the IVC.  Initial post lysis venogram performed from the right lower extremity demonstrates persistent occlusion at the level of the right common iliac vein.  Following 3 passes from each lower extremity with the 16 mm diameter Clottreiever Inari mechanical thrombectomy device, nearly all residual clot burden within the pelvis was removed.  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 This lesion was again  noted to be significantly caudal to the confluence of the bilateral renal veins.  As such, nearly obstructing caval lesion was treated with overlapping stent deployment of 20 mm diameter Venovo venous stents which were subsequently balloon angioplastied to 18 mm diameter.  Completion images demonstrate a technically excellent result with restored brisk flow through the bilateral pelvic systems and the IVC.  There is a mild residual narrowing at the level of the mid/caudal aspect of the IVC, though not felt to result in a residual hemodynamically significant stenosis.  No evidence of complication. Specifically, no evidence of contrast extravasation or vessel dissection.  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  PLAN: - Maintain IV heparin drip until patient is successfully converted to long-term anticoagulant.  - Recommend maintaining SCD devices while in bed.   Electronically Signed   By: Sandi Mariscal M.D.   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 Patient has experienced minimal improvement since initiation of IV heparin, and as such request made for initiation of catheter directed thrombolysis for symptomatic purposes. EXAM: 1. ULTRASOUND GUIDANCE FOR VASCULAR ACCESS  X2 2. FLUOROSCOPIC GUIDED PHARMACOLOGICAL AND MECHANICAL THROMBECTOMY WITH ANGIOJET DEVICE 3. BILATERAL LOWER EXTREMITY VENOGRAM AND PLACEMENT OF INFUSION CATHETERS COMPARISON:  CT chest, abdomen pelvis-04/11/2019; abdominal MRV - 04/13/2019 MEDICATIONS: Hydralazine 10 mg IV, administered near the completion of the procedure  secondary to persistently elevated blood pressure. CONTRAST:  60 cc Omnipaque 300 ANESTHESIA/SEDATION: Moderate (conscious) sedation was employed during this procedure. A total of Versed 3 mg and Fentanyl 125 mcg was administered intravenously. Moderate Sedation Time: 67 minutes. The patient's level of consciousness and vital signs were monitored continuously by radiology nursing throughout the procedure under my direct supervision. FLUOROSCOPY TIME:  18 minutes, 18 seconds (710 mGy) COMPLICATIONS: None immediate. TECHNIQUE: Informed written consent was obtained from the patient after a discussion of the risks (including but not limited to bleeding, including nontarget bleeding given history of malignancy, infection, pulmonary embolism and necessity for venous angioplasty and/or stent placement), benefits and alternatives to treatment. Questions regarding the procedure were encouraged and answered. A timeout was performed prior to the initiation of the procedure. The patient was placed prone on the fluoroscopy table and the skin posterior to the bilateral knees was prepped and draped in the usual sterile fashion, and a sterile drape was applied covering the operative field. Maximum barrier sterile technique with sterile gowns and gloves were used for the procedure. Under direct ultrasound guidance, the left popliteal vein was access with a micropuncture kit after the overlying soft tissues were anesthetized with 1% lidocaine. An ultrasound image was saved for documentation purposed. This allowed for placement of a 8-French vascular sheath. A limited venogram was performed through the side arm of the vascular sheath. With the use of a regular glidewire, a Kumpe catheter was advanced through the left femoral, external iliac vein and through the left common iliac vein to the level of the IVC. Limited vena cavogram demonstrates complete occlusion of the distal aspect of the IVC extending to involve both left common  iliac veins. Ultimately, a moderate to long segment apparently extrinsic malignant narrowing involving the mid/distal aspect of the IVC was successfully traversed with inferior venogram demonstrating patency of the more cranial infrarenal IVC. Next, with the use of a Angiojet device, approximately 250 cc of a solution of 10 mg of tPA mixed in 500 mg of saline was infused throughout the occluded segment of the IVC, left common and external iliac veins to the level of the left common femoral vein. Attention was now paid towards access of the right lower extremity popliteal vein. After the overlying soft tissues were anesthetized with 1% lidocaine, the right popliteal vein was accessed micropuncture kit. Limited venogram was performed through the outer micro puncture sheath. This allowed for placement of a 6 French, 35 cm vascular sheath. Again, with the use of a regular glidewire, a Kumpe catheter was advanced beyond the obstructing caval lesion. Contrast injection confirmed appropriate positioning. Next, after approximately 20 minute tPA dwell, mechanical thrombectomy was performed with the Angiojet device both from the left lower extremity access as well as the right lower extremity access. Limited vena cavagram was performed via the right lower extremity access. Kumpe catheter was utilized for measurement purposes and ultimately a 90 cm/20-cm infusion catheter was placed from the right lower extremity access and a 90 cm/30 cm infusion catheter was placed from the left lower extremity access adequately covering the occluded segment of the bilateral venous systems as well as the caudal aspect of the IVC. Postprocedural spot fluoroscopic images were obtained. Both popliteal vascular sheath  were secured in place. Dressings were applied. Patient's was subjectively cold by the end of the procedure with associated elevation in his blood pressure and as such he was administered 10 mg of hydralazine and bear hugger warming  device was applied. The patient otherwise tolerated the procedure well without immediate postprocedural complication. FINDINGS: Sonographic evaluation demonstrates patency of the bilateral popliteal veins. Contrast injection demonstrates patency of the bilateral popliteal and superficial femoral veins. There is occlusive thrombus extending from the left common femoral vein and the right common iliac vein through the mid/caudal aspect of the IVC secondary to a moderate to long segment length presumably extrinsic malignant occlusion secondary to known adjacent bulky retroperitoneal lymphadenopathy. Following pharmacological and mechanical thrombectomy as detailed above, there is restored flow as demonstrated on limited right central pelvic venogram with large amount of residual thrombus. Following infusion catheter placement, both side ports traverse both occluded segments of the IVC and pelvic venous systems. IMPRESSION: 1. 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 2. Successful initiation of bilateral mechanical and pharmacologic thrombectomy with bilateral infusion catheters appropriately positioned. PLAN: The patient will be transferred to ICU for overnight lytic infusion and will return tomorrow for repeat venogram and potential intervention. Electronically Signed   By: Sandi Mariscal M.D.   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 Patient has experienced minimal improvement since initiation of IV heparin, and as such request made for initiation of catheter directed thrombolysis for symptomatic purposes. EXAM: 1. ULTRASOUND GUIDANCE FOR VASCULAR ACCESS X2  2. FLUOROSCOPIC GUIDED PHARMACOLOGICAL AND MECHANICAL THROMBECTOMY WITH ANGIOJET DEVICE 3. BILATERAL LOWER EXTREMITY VENOGRAM AND PLACEMENT OF INFUSION CATHETERS COMPARISON:  CT chest, abdomen pelvis-04/11/2019; abdominal MRV - 04/13/2019 MEDICATIONS: Hydralazine 10 mg IV, administered near the completion of the procedure secondary to persistently elevated blood pressure. CONTRAST:  60 cc Omnipaque 300 ANESTHESIA/SEDATION: Moderate (conscious) sedation was employed during this procedure. A total of Versed 3 mg and Fentanyl 125 mcg was administered intravenously. Moderate Sedation Time: 67 minutes. The patient's level of consciousness and vital signs were monitored continuously by radiology nursing throughout the procedure under my direct supervision. FLUOROSCOPY TIME:  18 minutes, 18 seconds (732 mGy) COMPLICATIONS: None immediate. TECHNIQUE: Informed written consent was obtained from the patient after a discussion of the risks (including but not limited to bleeding, including nontarget bleeding given history of malignancy, infection, pulmonary embolism and necessity for venous angioplasty and/or stent placement), benefits and alternatives to treatment. Questions regarding the procedure were encouraged and answered. A timeout was performed prior to the initiation of the procedure. The patient was placed prone on the fluoroscopy table and the skin posterior to the bilateral knees was prepped and draped in the usual sterile fashion, and a sterile drape was applied covering the operative field. Maximum barrier sterile technique with sterile gowns and gloves were used for the procedure. Under direct ultrasound guidance, the left popliteal vein was access with a micropuncture kit after the overlying soft tissues were anesthetized with 1% lidocaine. An ultrasound image was saved for documentation purposed. This allowed for placement of a 8-French vascular sheath. A limited venogram was performed through the side arm of  the vascular sheath. With the use of a regular  glidewire, a Kumpe catheter was advanced through the left femoral, external iliac vein and through the left common iliac vein to the level of the IVC. Limited vena cavogram demonstrates complete occlusion of the distal aspect of the IVC extending to involve both left common iliac veins. Ultimately, a moderate to long segment apparently extrinsic malignant narrowing involving the mid/distal aspect of the IVC was successfully traversed with inferior venogram demonstrating patency of the more cranial infrarenal IVC. Next, with the use of a Angiojet device, approximately 250 cc of a solution of 10 mg of tPA mixed in 500 mg of saline was infused throughout the occluded segment of the IVC, left common and external iliac veins to the level of the left common femoral vein. Attention was now paid towards access of the right lower extremity popliteal vein. After the overlying soft tissues were anesthetized with 1% lidocaine, the right popliteal vein was accessed micropuncture kit. Limited venogram was performed through the outer micro puncture sheath. This allowed for placement of a 6 French, 35 cm vascular sheath. Again, with the use of a regular glidewire, a Kumpe catheter was advanced beyond the obstructing caval lesion. Contrast injection confirmed appropriate positioning. Next, after approximately 20 minute tPA dwell, mechanical thrombectomy was performed with the Angiojet device both from the left lower extremity access as well as the right lower extremity access. Limited vena cavagram was performed via the right lower extremity access. Kumpe catheter was utilized for measurement purposes and ultimately a 90 cm/20-cm infusion catheter was placed from the right lower extremity access and a 90 cm/30 cm infusion catheter was placed from the left lower extremity access adequately covering the occluded segment of the bilateral venous systems as well as the caudal aspect of the  IVC. Postprocedural spot fluoroscopic images were obtained. Both popliteal vascular sheath were secured in place. Dressings were applied. Patient's was subjectively cold by the end of the procedure with associated elevation in his blood pressure and as such he was administered 10 mg of hydralazine and bear hugger warming device was applied. The patient otherwise tolerated the procedure well without immediate postprocedural complication. FINDINGS: Sonographic evaluation demonstrates patency of the bilateral popliteal veins. Contrast injection demonstrates patency of the bilateral popliteal and superficial femoral veins. There is occlusive thrombus extending from the left common femoral vein and the right common iliac vein through the mid/caudal aspect of the IVC secondary to a moderate to long segment length presumably extrinsic malignant occlusion secondary to known adjacent bulky retroperitoneal lymphadenopathy. Following pharmacological and mechanical thrombectomy as detailed above, there is restored flow as demonstrated on limited right central pelvic venogram with large amount of residual thrombus. Following infusion catheter placement, both side ports traverse both occluded segments of the IVC and pelvic venous systems. IMPRESSION: 1. 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 2. Successful initiation of bilateral mechanical and pharmacologic thrombectomy with bilateral infusion catheters appropriately positioned. PLAN: The patient will be transferred to ICU for overnight lytic infusion and will return tomorrow for repeat venogram and potential intervention. Electronically Signed   By: Sandi Mariscal M.D.   On: 04/15/2019 17:04   CT BIOPSY  Result Date: 04/12/2019 INDICATION: 39 year old male with a history of testicular cancer previously treated. He presents with new retroperitoneal adenopathy  EXAM: CT BIOPSY MEDICATIONS: None. ANESTHESIA/SEDATION: Moderate (conscious) sedation was employed during this procedure. A total of Versed 2.0 mg and Fentanyl 100 mcg  was administered intravenously. Moderate Sedation Time: 14 minutes. The patient's level of consciousness and vital signs were monitored continuously by radiology nursing throughout the procedure under my direct supervision. FLUOROSCOPY TIME:  CT COMPLICATIONS: None PROCEDURE: Informed written consent was obtained from the patient after a thorough discussion of the procedural risks, benefits and alternatives. All questions were addressed. Maximal Sterile Barrier Technique was utilized including caps, mask, sterile gowns, sterile gloves, sterile drape, hand hygiene and skin antiseptic. A timeout was performed prior to the initiation of the procedure. Patient positioned prone position on CT gantry table. Scout CT acquired for planning purposes. The patient was then prepped and draped in the usual sterile fashion. 1% lidocaine was used for local anesthesia. Introducer needle was then placed targeting the left-sided retroperitoneal lymphadenopathy. Once we confirmed needle tip position, multiple 18 gauge core biopsy were acquired. Specimen placed in the saline as S fresh specimen. Needle was removed and a final CT was acquired. Patient tolerated the procedure well and remained hemodynamically stable throughout. No complications were encountered and no significant blood loss. IMPRESSION: Status post CT-guided biopsy of left retroperitoneal lymphadenopathy. Signed, Dulcy Fanny. Dellia Nims, RPVI [facility] Electronically Signed   By: [name] D.O.   On: 04/12/2019 12:56   DG Chest Port 1 View  Result Date: 04/18/2019 CLINICAL DATA:  Fever, testicular cancer EXAM: PORTABLE CHEST 1 VIEW COMPARISON:  None. FINDINGS: Normal heart size. Normal mediastinal contour. No pneumothorax. No pleural effusion.  Slightly low lung volumes. Minimal streaky opacities at both lung bases. No pulmonary edema. IMPRESSION: Slightly low lung volumes. Minimal streaky opacities at the lung bases, favoring minimal atelectasis. No consolidative airspace disease to suggest a pneumonia. Electronically Signed   By: Ilona Sorrel M.D.   On: 04/18/2019 15:41   DG Abd Portable 1V  Result Date: 04/18/2019 CLINICAL DATA:  Abdominal pain and distension EXAM: PORTABLE ABDOMEN - 1 VIEW COMPARISON:  04/11/2019 FINDINGS: Supine frontal view of the abdomen and pelvis excludes the hemidiaphragms, right flank, and lower pelvis by collimation. There is diffuse gaseous distention of the colon, likely reflecting postoperative ileus. Stable position of the IVC stent. No masses or abnormal calcifications. IMPRESSION: 1. Distended gas-filled colon consistent with postprocedural ileus. 2. IVC stent as above Electronically Signed   By: Randa Ngo M.D.   On: 04/18/2019 08:41   ECHOCARDIOGRAM COMPLETE  Result Date: 04/19/2019   ECHOCARDIOGRAM REPORT   Patient Name:   SERAPIO EDELSON Date of Exam: 04/19/2019 Medical Rec #:  [id number]      Height:       75.0 in Accession #:    [id number]     Weight:       216.9 lb Date of Birth:  12-29-1980       BSA:          2.27 m Patient Age:    38 years       BP:           132/83 mmHg Patient Gender: M              HR:           125 bpm. Exam Location:  Inpatient Procedure: 2D Echo, Color Doppler and Cardiac Doppler                          MODIFIED REPORT: This report was modified by Cherlynn Kaiser MD on 04/19/2019 due to.  Indications:  Fever  History:         Patient has no prior history of Echocardiogram examinations.                  Testicular cancer; Signs/Symptoms:Fever.  Sonographer:     Johny Chess Referring Phys:  [id number] Noland Fordyce MATHEWS Diagnosing Phys: Cherlynn Kaiser MD  Sonographer Comments: Suboptimal apical window. IMPRESSIONS  1. Left ventricular ejection fraction, by visual estimation, is  60 to 65%. The left ventricle has normal function. Mildly increased left ventricular posterior wall thickness. There is mildly increased left ventricular hypertrophy.  2. Global right ventricle has normal systolic function.The right ventricular size is normal. Right vetricular wall thickness was not assessed.  3. The mitral valve is normal in structure. No evidence of mitral valve regurgitation. No evidence of mitral stenosis.  4. The tricuspid valve is normal in structure. Tricuspid valve regurgitation is not demonstrated.  5. The aortic valve is normal in structure. Aortic valve regurgitation is not visualized. No evidence of aortic valve sclerosis or stenosis.  6. The inferior vena cava is normal in size with greater than 50% respiratory variability, suggesting right atrial pressure of 3 mmHg.  7. No evidence of valvular vegetations on this transthoracic echocardiogram, however acoustic windows are suboptimal. FINDINGS  Left Ventricle: Left ventricular ejection fraction, by visual estimation, is 60 to 65%. The left ventricle has normal function. The left ventricle is not well visualized. Mildly increased left ventricular posterior wall thickness. There is mildly increased left ventricular hypertrophy. Left ventricular diastolic parameters were normal. Normal left atrial pressure. Right Ventricle: The right ventricular size is normal. Right vetricular wall thickness was not assessed. Global RV systolic function is has normal systolic function. Left Atrium: Left atrial size was normal in size. Right Atrium: Right atrial size was normal in size Pericardium: There is no evidence of pericardial effusion. Mitral Valve: The mitral valve is normal in structure. No evidence of mitral valve regurgitation. No evidence of mitral valve stenosis by observation. Tricuspid Valve: The tricuspid valve is normal in structure. Tricuspid valve regurgitation is not demonstrated. Aortic Valve: The aortic valve is normal in structure.  Aortic valve regurgitation is not visualized. The aortic valve is structurally normal, with no evidence of sclerosis or stenosis. Pulmonic Valve: The pulmonic valve was normal in structure. Pulmonic valve regurgitation is not visualized. Pulmonic regurgitation is not visualized. Aorta: The aortic root is normal in size and structure. Venous: The inferior vena cava is normal in size with greater than 50% respiratory variability, suggesting right atrial pressure of 3 mmHg. IAS/Shunts: The interatrial septum was not well visualized. Additional Comments: No evidence of valvular vegetations on this transthoracic echocardiogram. Would recommend a transesophageal echocardiogram to exclude infective endocarditis if clinically indicated.  LEFT VENTRICLE PLAX 2D LVIDd:         5.00 cm  Diastology LVIDs:         3.10 cm  LV e' lateral: 14.10 cm/s LV PW:         1.10 cm  LV e' medial:  12.30 cm/s LV IVS:        0.80 cm LVOT diam:     2.40 cm LV SV:         80 ml LV SV Index:   35.02 LVOT Area:     4.52 cm  RIGHT VENTRICLE RV S prime:     27.10 cm/s TAPSE (M-mode): 2.8 cm LEFT ATRIUM           Index LA diam:  3.40 cm 1.50 cm/m LA Vol (A4C): 38.8 ml 17.08 ml/m  AORTIC VALVE LVOT Vmax:   105.00 cm/s LVOT Vmean:  74.200 cm/s LVOT VTI:    0.167 m  AORTA Ao Root diam: 3.60 cm  SHUNTS Systemic VTI:  0.17 m Systemic Diam: 2.40 cm  Cherlynn Kaiser MD Electronically signed by Cherlynn Kaiser MD Signature Date/Time: 04/19/2019/7:59:29 PM    Final (Updated)    IR TRANSCATH PLC STENT 1ST ART NOT LE CV CAR VERT CAR  Result Date: 04/16/2019 INDICATION: History of newly diagnosed recurrent testicular cancer, now with bulky 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 Patient was initiated bilateral pelvic/IVC catheter directed thrombolysis day prior (04/15/2019), and returns today following 24 hours of tPA infusion. EXAM: 1. IR THROMB F/U EVAL ART/VEN  FINAL DAY 2. FLUOROSCOPIC GUIDED ANGIOPLASTY AND STENT PLACEMENT OF THE IVC COMPARISON:  Initiation of bilateral catheter directed pelvic venous and IVC thrombolysis - 04/15/2019; CT abdomen pelvis - 04/11/2019 MEDICATIONS: Heparin 7000 units CONTRAST:  85 cc Omnipaque 300 ANESTHESIA/SEDATION: Moderate (conscious) sedation was employed during this procedure. A total of Versed 2 mg and Dilaudid 3 mg was administered intravenously. Moderate Sedation Time: 128 minutes. The patient's level of consciousness and vital signs were monitored continuously by radiology nursing throughout the procedure under my direct supervision. FLUOROSCOPY TIME:  14 minutes, 12 seconds (115.7 mGy) COMPLICATIONS: None immediate. TECHNIQUE: Informed written consent was obtained from the patient after a discussion of the risks, benefits and alternatives to treatment. Questions regarding the procedure were encouraged and answered. A timeout was performed prior to the initiation of the procedure. The patient was placed prone on the fluoroscopy table and the skin posterior to the bilateral knees as well as the external portion the existing bilateral vascular sheaths and infusion catheters was prepped and draped in usual sterile fashion. The surrounding subcutaneous tissues about both popliteal approach vascular sheath was anesthetized with 1% lidocaine with epinephrine. Next, post lysis venograms were performed from the bilateral infusion catheters following removal of the bilateral infusion wires. Images were reviewed and the decision was made to proceed with additional mechanical thrombectomy. With the use of a vertebral catheter exchange length stiff Glidewires were advanced from both existing popliteal vascular sheaths superior to the nearly obstructing caval lesion. Contrast injection confirmed appropriate positioning. Stiff glide wires were advanced into the left subclavian vein. 4000 units of heparin was administered intravenously. Next,  beginning with the left lower extremity, the existing 8 French vascular sheath was exchanged over the glidewire for a 13 [place] vascular sheath. The 16 mm diameter [place] Inari mechanical thrombectomy device was then utilized to perform 3 passes from the left popliteal approach from at and just below level of the obstructing caval lesion. Post thrombectomy images were obtained from the left lower extremity. Next, the existing right lower extremity 6 French popliteal vein vascular sheath was exchanged over a guidewire for a 13 French vascular sheath. Again, from the right lower extremity approach, the 16 mm diameter [place] Inari mechanical thrombectomy device was then utilized to perform 3 passes from the level at and just below the obstructing caval lesion. Post thrombectomy images were obtained from the right lower extremity. An ACT level was obtained and an additional 3000 units of heparin was administered intravenously. The long segment eccentric irregular narrowing/subtotal occlusion of the IVC was subsequently balloon angioplastied at multiple stations with a 14 mm x 4 cm Atlas balloon. Despite adequate balloon apposition, post angioplasty  venogram images demonstrate a recurrent hemodynamically significant narrowing. As such, a 20 mm x 60 mm Venovo venous stent was deployed across the superior and mid aspect of the caval lesion and was subsequently balloon angioplastied at multiple stations initially with a 16 mm x 4 cm atlas balloon and ultimately with an 18 mm x 4 cm balloon. Post stent deployment venogram images were obtained however there is felt to be incomplete coverage of the distal aspect of the malignant narrowing. As such, an additional 20 mm x 40 mm Venovo venous stent was deployed overlapping the mid/caudal aspect of venous stent and adequately covering the caudal aspect caval lesion. Both overlapping venous stents were subsequently balloon angioplastied at multiple stations with the 18  mm atlas balloon and completion venogram images were obtained from both lower extremities. Images were reviewed and the procedure was terminated. All wires, catheters and sheaths were removed from the bilateral popliteal vein accesses and hemostasis was achieved with manual compression. Dressings applied. The patient tolerated the procedure well without immediate postprocedural complication. FINDINGS: Initial post lysis venogram performed from the left lower extremity infusion catheter demonstrates restoration of flow through the left pelvis with moderate amount residual thrombus primarily within the peripheral aspect of the left external iliac vein as well as the left common and central aspect of the left superficial femoral veins. There is passage of contrast beyond the nearly obstructing lesion involving mid/caudal aspect of the IVC. Initial post lysis venogram performed from the right lower extremity demonstrates persistent occlusion at the level of the right common iliac vein. Following 3 passes from each lower extremity with the 16 mm diameter Clottreiever Inari mechanical thrombectomy device, nearly all residual clot burden within the pelvis was removed. 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 This lesion was again noted to be significantly caudal to the confluence of the bilateral renal veins. As such, nearly obstructing caval lesion was treated with overlapping stent deployment of 20 mm diameter Venovo venous stents which were subsequently balloon angioplastied to 18 mm diameter. Completion images demonstrate a technically excellent result with restored brisk flow through the bilateral pelvic systems and the IVC. There is a mild residual narrowing at the level of  the mid/caudal aspect of the IVC, though not felt to result in a residual hemodynamically significant stenosis. No evidence of complication. Specifically, no evidence of contrast extravasation or vessel dissection. 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 PLAN: - Maintain IV heparin drip until patient is successfully converted to long-term anticoagulant. - Recommend maintaining SCD devices while in bed. Electronically Signed   By: Sandi Mariscal M.D.   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 EXAM: MRI ABDOMEN WITHOUT AND WITH CONTRAST TECHNIQUE: Multiplanar multisequence MR imaging of the abdomen was performed both before and after the administration of intravenous contrast. CONTRAST:  25m GADAVIST GADOBUTROL 1 MMOL/ML IV SOLN COMPARISON:  CT 04/11/2019 FINDINGS: Vascular: Arterial: Unremarkable course caliber and contour of the visualized abdominal aorta. Unremarkable course caliber and contour of the bilateral iliac arteries and proximal  femoral arteries. Bilateral hypogastric arteries are patent. Venous: Right iliac/femoral system: Flow signal is maintained within the visualized proximal femoral veins. Flow signal maintained into the external iliac vein. Absent flow signal with evidence of thrombus involving the proximal right common iliac vein to the confluence and into the IVC. Expansion of the right common iliac vein. Multiple pelvic collateral vessels. Left iliac/femoral system: Absent flow signal through the length of the left iliac venous system extending through the femoral vein. Expansion of the left iliac  and femoral venous system. Multiple pelvic collateral veins. IVC: The IVC is not well evaluated at the level of the adenopathy. Absent flow signal above the confluence of the iliac veins compatible with ilio caval thrombus. The juxtarenal IVC is patent with maintained flow signal and maintain flow signal of the bilateral renal veins. Nonvascular: Unremarkable musculoskeletal structures. Partially distended urinary bladder. Unremarkable appearance of the visualized kidneys and GI system. Edema of the lower abdominal wall/pelvic body wall, as well as edema of the left proximal lower extremity. 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 Pathologic lymphadenopathy of the retroperitoneum, similar to comparison CT, contributing to external compression of the infrarenal IVC. The current MR is nondiagnostic to confirm or rule out tumor thrombus. Lower abdominal/pelvic wall edema including edema of the proximal left lower extremity. Signed, Dulcy Fanny. Dellia Nims, RPVI [facility] Electronically Signed   By: [name] D.O.   On: 04/13/2019 14:14   VAS [place] LOWER EXTREMITY VENOUS (DVT)  Result Date: 04/11/2019  Lower Venous Study Indications: Swelling.  Risk Factors: None identified. Anticoagulation: Heparin. Limitations: Poor ultrasound/tissue interface and bowel gas. Comparison Study: No prior studies. Performing Technologist: Oliver Hum RVT  Examination Guidelines: A complete evaluation includes B-mode imaging, spectral Doppler, color Doppler, and power Doppler as needed of all accessible portions of each vessel. Bilateral testing is considered an integral part of a complete examination. Limited examinations for reoccurring indications may be performed as noted.   +---------+---------------+---------+-----------+----------+--------------+ RIGHT    CompressibilityPhasicitySpontaneityPropertiesThrombus Aging +---------+---------------+---------+-----------+----------+--------------+ CFV      Full           Yes      Yes                                 +---------+---------------+---------+-----------+----------+--------------+ SFJ      Full                                                        +---------+---------------+---------+-----------+----------+--------------+ FV Prox  Full                                                        +---------+---------------+---------+-----------+----------+--------------+ FV Mid   Full                                                        +---------+---------------+---------+-----------+----------+--------------+  FV DistalFull                                                        +---------+---------------+---------+-----------+----------+--------------+ PFV      Full                                                        +---------+---------------+---------+-----------+----------+--------------+ POP      Full           Yes      Yes                                 +---------+---------------+---------+-----------+----------+--------------+ PTV      Full                                                        +---------+---------------+---------+-----------+----------+--------------+ PERO     Full                                                        +---------+---------------+---------+-----------+----------+--------------+   +---------+---------------+---------+-----------+----------+--------------+ LEFT     CompressibilityPhasicitySpontaneityPropertiesThrombus Aging +---------+---------------+---------+-----------+----------+--------------+ CFV      None           No       No                   Acute           +---------+---------------+---------+-----------+----------+--------------+ SFJ      None                                         Acute          +---------+---------------+---------+-----------+----------+--------------+ FV Prox  None           No       No                   Acute          +---------+---------------+---------+-----------+----------+--------------+ FV Mid   None           No       No                   Acute          +---------+---------------+---------+-----------+----------+--------------+ FV DistalNone           No       No                   Acute          +---------+---------------+---------+-----------+----------+--------------+ POP      Partial        No  No                   Acute          +---------+---------------+---------+-----------+----------+--------------+ PTV      Full                                                        +---------+---------------+---------+-----------+----------+--------------+ PERO     None                                         Acute          +---------+---------------+---------+-----------+----------+--------------+ EIV      None           No       No                   Acute          +---------+---------------+---------+-----------+----------+--------------+ Unable to visualize CIV, or IVC due to bowel gas.    Summary: Right: There is no evidence of deep vein thrombosis in the lower extremity. No cystic structure found in the popliteal fossa. Left: Findings consistent with acute deep vein thrombosis involving the left external iliac vein, common femoral vein, left femoral vein, left popliteal vein, and left peroneal veins. No cystic structure found in the popliteal fossa. Unable to visualize CIV, or IVC due to bowel gas.  *See table(s) above for measurements and observations. Electronically signed by Deitra Mayo MD on 04/11/2019 at 4:09:39 PM.    Final    IR INFUSION THROMBOL VENOUS INITIAL  (MS)  Result Date: 04/15/2019 INDICATION: History of newly diagnosed recurrent testicular cancer, now with bulky 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 Patient has experienced minimal improvement since initiation of IV heparin, and as such request made for initiation of catheter directed thrombolysis for symptomatic purposes. EXAM: 1. ULTRASOUND GUIDANCE FOR VASCULAR ACCESS X2 2. FLUOROSCOPIC GUIDED PHARMACOLOGICAL AND MECHANICAL THROMBECTOMY WITH ANGIOJET DEVICE 3. BILATERAL LOWER EXTREMITY VENOGRAM AND PLACEMENT OF INFUSION CATHETERS COMPARISON:  CT chest, abdomen pelvis-04/11/2019; abdominal MRV - 04/13/2019 MEDICATIONS: Hydralazine 10 mg IV, administered near the completion of the procedure secondary to persistently elevated blood pressure. CONTRAST:  60 cc Omnipaque 300 ANESTHESIA/SEDATION: Moderate (conscious) sedation was employed during this procedure. A total of Versed 3 mg and Fentanyl 125 mcg was administered intravenously. Moderate Sedation Time: 67 minutes. The patient's level of consciousness and vital signs were monitored continuously by radiology nursing throughout the procedure under my direct supervision. FLUOROSCOPY TIME:  18 minutes, 18 seconds (149 mGy) COMPLICATIONS: None immediate. TECHNIQUE: Informed written consent was obtained from the patient after a discussion of the risks (including but not limited to bleeding, including nontarget bleeding given history of malignancy, infection, pulmonary embolism and necessity for venous angioplasty and/or stent placement), benefits and alternatives to treatment. Questions regarding the procedure were encouraged and answered. A timeout was performed prior to the initiation of the procedure. The patient was placed prone on the fluoroscopy table and the skin posterior to the bilateral knees was prepped and draped in the usual sterile fashion, and a sterile drape was  applied covering the operative field. Maximum barrier sterile technique with sterile  gowns and gloves were used for the procedure. Under direct ultrasound guidance, the left popliteal vein was access with a micropuncture kit after the overlying soft tissues were anesthetized with 1% lidocaine. An ultrasound image was saved for documentation purposed. This allowed for placement of a 8-French vascular sheath. A limited venogram was performed through the side arm of the vascular sheath. With the use of a regular glidewire, a Kumpe catheter was advanced through the left femoral, external iliac vein and through the left common iliac vein to the level of the IVC. Limited vena cavogram demonstrates complete occlusion of the distal aspect of the IVC extending to involve both left common iliac veins. Ultimately, a moderate to long segment apparently extrinsic malignant narrowing involving the mid/distal aspect of the IVC was successfully traversed with inferior venogram demonstrating patency of the more cranial infrarenal IVC. Next, with the use of a Angiojet device, approximately 250 cc of a solution of 10 mg of tPA mixed in 500 mg of saline was infused throughout the occluded segment of the IVC, left common and external iliac veins to the level of the left common femoral vein. Attention was now paid towards access of the right lower extremity popliteal vein. After the overlying soft tissues were anesthetized with 1% lidocaine, the right popliteal vein was accessed micropuncture kit. Limited venogram was performed through the outer micro puncture sheath. This allowed for placement of a 6 French, 35 cm vascular sheath. Again, with the use of a regular glidewire, a Kumpe catheter was advanced beyond the obstructing caval lesion. Contrast injection confirmed appropriate positioning. Next, after approximately 20 minute tPA dwell, mechanical thrombectomy was performed with the Angiojet device both from the left lower extremity  access as well as the right lower extremity access. Limited vena cavagram was performed via the right lower extremity access. Kumpe catheter was utilized for measurement purposes and ultimately a 90 cm/20-cm infusion catheter was placed from the right lower extremity access and a 90 cm/30 cm infusion catheter was placed from the left lower extremity access adequately covering the occluded segment of the bilateral venous systems as well as the caudal aspect of the IVC. Postprocedural spot fluoroscopic images were obtained. Both popliteal vascular sheath were secured in place. Dressings were applied. Patient's was subjectively cold by the end of the procedure with associated elevation in his blood pressure and as such he was administered 10 mg of hydralazine and bear hugger warming device was applied. The patient otherwise tolerated the procedure well without immediate postprocedural complication. FINDINGS: Sonographic evaluation demonstrates patency of the bilateral popliteal veins. Contrast injection demonstrates patency of the bilateral popliteal and superficial femoral veins. There is occlusive thrombus extending from the left common femoral vein and the right common iliac vein through the mid/caudal aspect of the IVC secondary to a moderate to long segment length presumably extrinsic malignant occlusion secondary to known adjacent bulky retroperitoneal lymphadenopathy. Following pharmacological and mechanical thrombectomy as detailed above, there is restored flow as demonstrated on limited right central pelvic venogram with large amount of residual thrombus. Following infusion catheter placement, both side ports traverse both occluded segments of the IVC and pelvic venous systems. IMPRESSION: 1. 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 2. Successful initiation of bilateral mechanical and  pharmacologic thrombectomy with bilateral infusion catheters appropriately positioned. PLAN: The patient will be transferred to ICU for overnight lytic infusion and will return tomorrow for  repeat venogram and potential intervention. Electronically Signed   By: Sandi Mariscal M.D.   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 Patient was initiated bilateral pelvic/IVC catheter directed thrombolysis day prior (04/15/2019), and returns today following 24 hours of tPA infusion. EXAM: 1. IR THROMB F/U EVAL ART/VEN FINAL DAY 2. FLUOROSCOPIC GUIDED ANGIOPLASTY AND STENT PLACEMENT OF THE IVC COMPARISON:  Initiation of bilateral catheter directed pelvic venous and IVC thrombolysis - 04/15/2019; CT abdomen pelvis - 04/11/2019 MEDICATIONS: Heparin 7000 units CONTRAST:  85 cc Omnipaque 300 ANESTHESIA/SEDATION: Moderate (conscious) sedation was employed during this procedure. A total of Versed 2 mg and Dilaudid 3 mg was administered intravenously. Moderate Sedation Time: 128 minutes. The patient's level of consciousness and vital signs were monitored continuously by radiology nursing throughout the procedure under my direct supervision. FLUOROSCOPY TIME:  14 minutes, 12 seconds (196.2 mGy) COMPLICATIONS: None immediate. TECHNIQUE: Informed written consent was obtained from the patient after a discussion of the risks, benefits and alternatives to treatment. Questions regarding the procedure were encouraged and answered. A timeout was performed prior to the initiation of the procedure. The patient was placed prone on the fluoroscopy table and the skin posterior to the bilateral knees as well as the external portion the existing bilateral vascular sheaths and infusion catheters  was prepped and draped in usual sterile fashion. The surrounding subcutaneous tissues about both popliteal approach vascular sheath was anesthetized with 1% lidocaine with epinephrine. Next, post lysis venograms were performed from the bilateral infusion catheters following removal of the bilateral infusion wires. Images were reviewed and the decision was made to proceed with additional mechanical thrombectomy. With the use of a vertebral catheter exchange length stiff Glidewires were advanced from both existing popliteal vascular sheaths superior to the nearly obstructing caval lesion. Contrast injection confirmed appropriate positioning. Stiff glide wires were advanced into the left subclavian vein. 4000 units of heparin was administered intravenously. Next, beginning with the left lower extremity, the existing 8 French vascular sheath was exchanged over the glidewire for a 13 [place] vascular sheath. The 16 mm diameter Clottreiever Inari mechanical thrombectomy device was then utilized to perform 3 passes from the left popliteal approach from at and just below level of the obstructing caval lesion. Post thrombectomy images were obtained from the left lower extremity. Next, the existing right lower extremity 6 French popliteal vein vascular sheath was exchanged over a guidewire for a 13 French vascular sheath. Again, from the right lower extremity approach, the 16 mm diameter Clottreiever Inari mechanical thrombectomy device was then utilized to perform 3 passes from the level at and just below the obstructing caval lesion. Post thrombectomy images were obtained from the right lower extremity. An ACT level was obtained and an additional 3000 units of heparin was administered intravenously. The long segment eccentric irregular narrowing/subtotal occlusion of the IVC was subsequently balloon angioplastied at multiple stations with a 14 mm x 4 cm Atlas balloon. Despite adequate balloon apposition, post angioplasty  venogram images demonstrate a recurrent hemodynamically significant narrowing. As such, a 20 mm x 60 mm Venovo venous stent was deployed across the superior and mid aspect of the caval lesion and was subsequently balloon angioplastied at multiple stations initially with a 16 mm x 4 cm atlas balloon and ultimately with an 18 mm  x 4 cm balloon. Post stent deployment venogram images were obtained however there is felt to be incomplete coverage of the distal aspect of the malignant narrowing. As such, an additional 20 mm x 40 mm Venovo venous stent was deployed overlapping the mid/caudal aspect of venous stent and adequately covering the caudal aspect caval lesion. Both overlapping venous stents were subsequently balloon angioplastied at multiple stations with the 18 mm atlas balloon and completion venogram images were obtained from both lower extremities. Images were reviewed and the procedure was terminated. All wires, catheters and sheaths were removed from the bilateral popliteal vein accesses and hemostasis was achieved with manual compression. Dressings applied. The patient tolerated the procedure well without immediate postprocedural complication. FINDINGS: Initial post lysis venogram performed from the left lower extremity infusion catheter demonstrates restoration of flow through the left pelvis with moderate amount residual thrombus primarily within the peripheral aspect of the left external iliac vein as well as the left common and central aspect of the left superficial femoral veins. There is passage of contrast beyond the nearly obstructing lesion involving mid/caudal aspect of the IVC. Initial post lysis venogram performed from the right lower extremity demonstrates persistent occlusion at the level of the right common iliac vein. Following 3 passes from each lower extremity with the 16 mm diameter Clottreiever Inari mechanical thrombectomy device, nearly all residual clot burden within the pelvis was  removed. 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 This lesion was again noted to be significantly caudal to the confluence of the bilateral renal veins. As such, nearly obstructing caval lesion was treated with overlapping stent deployment of 20 mm diameter Venovo venous stents which were subsequently balloon angioplastied to 18 mm diameter. Completion images demonstrate a technically excellent result with restored brisk flow through the bilateral pelvic systems and the IVC. There is a mild residual narrowing at the level of the mid/caudal aspect of the IVC, though not felt to result in a residual hemodynamically significant stenosis. No evidence of complication. Specifically, no evidence of contrast extravasation or vessel dissection. 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 PLAN: - Maintain IV heparin drip until patient is successfully converted to long-term anticoagulant. - Recommend maintaining SCD devices while in bed. Electronically Signed   By: Sandi Mariscal M.D.   On: 04/16/2019 16:26    ASSESSMENT AND PLAN:  1.  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  extensive Left lower extremity DVT, s/p thrombolysis and IVC stent placement 4. Fever with unknown origin 5. ilius   6. Worsening anemia  7.deconditioning  8.  History of stage Ib seminoma testicular cancer diagnosed in 2012, status post right orchiectomy-no adjuvant chemotherapy given  Recommendations: -I think he is worsening anemia is related to recent retroperitoneal hematoma, and dilutional  due to high volume IVF lately, no new signs of bleeding.  Hemoglobin is down to 7.0 today.  Discussed transfusion of 1 unit PRBCs today, but the patient has declined a blood transfusion.  We will continue to monitor CBC.  We discussed with the patient that counts will likely drop after receiving chemotherapy and that he may need PRBC transfusion. -fever could be related to infection (although cultures have been negative), thrombosis and recent hematoma. Tumor  fever is also possible.  Remains on antibiotics. -he is deconditioned, needs PT -Ileus improving and he is starting clear liquids today. -He is unlikely going home soon, and his fever is possible related to tumor. I recommend him to start inpt chemo with cisplatin and etoposide (daily infusion 6-8 hours) for 5 days, probably starting next Monday.  Adverse effects were reviewed with him on 04/20/2019. -We have requested chemo education today. -Discussed with [facility] [facility] oncology charge nurse, and probably transfer him to [facility] on Sunday, to start chemo on Monday 2/8 -Discussed Port-A-Cath versus PICC line placement for chemotherapy.  The patient is not interested in a Port-A-Cath at this time.  He is agreeable to PICC line.  We will request PICC line placement this weekend by IV nurse. -Recommend transition from IV heparin to Lovenox 1 mg/kg twice daily this weekend.  Timothy [name]  04/21/2019 .    Addendum  I have seen the patient. I agree with the assessment and and plan and have edited the notes.   He has started clear liquid, hopefully his diet could be advanced over the weekend, I will placed nutrition consult.   Pt refused port placement, please get a PICC line in over the weekend. He also declined blood transfusion. I will check iron and ret count.   If he continues to improve over the weekend, please transfer him to [place] [address] on Sunday, plan to start chemotherapy on Monday. I will ask my on-call partner Dr. Maylon Peppers to see him on Sunday before  transfer.   I appreciate the excellent care from hospitalist team. Please call me If you have any questions.  Timothy [name]  04/21/2019  Cell: [phone]

## 2019-04-21 NOTE — Progress Notes (Signed)
Referring Physician(s): Dr. Burr Medico  Supervising Physician: Aletta Edouard  Patient Status:  [facility] - In-pt  Chief Complaint: Seminoma, retroperitoneal lymphadenopathy IVC occlusion, bilateral pelvic DVT, LLE DVT s/p mechanical thrombectomy and catheter-directed thrombolysis with stent placement in the mid/distal IVC  Subjective: Reports he overdid it yesterday and has not been able to participate as heavily in PT today.  He has been OOB with assistance.   Had several BMs so has been able to start clears.   Allergies: Patient has no known allergies.  Medications: Prior to Admission medications   Medication Sig Start Date End Date Taking? Authorizing Provider  cyclobenzaprine (FLEXERIL) 10 MG tablet Take 1 tablet (10 mg total) by mouth 3 (three) times daily as needed for muscle spasms. 04/04/19  Yes Lamptey, Myrene Galas, MD  ibuprofen (ADVIL) 600 MG tablet Take 1 tablet (600 mg total) by mouth every 6 (six) hours as needed. 04/04/19  Yes Lamptey, Myrene Galas, MD     Vital Signs: BP 124/83 (BP Location: Left Arm)   Pulse (!) 126   Temp 98.6 F (37 C) (Oral)   Resp (!) 21   Ht 6\' 3"  (1.905 m)   Wt 216 lb 14.9 oz (98.4 kg)   SpO2 99%   BMI 27.11 kg/m   Physical Exam  NAD, alert, resting comfortably in bed.  MSK: mild LLE swelling, stable today  GU: Urine discoloration slowly improving.   Imaging: DG Abd 1 View  Result Date: 04/21/2019 CLINICAL DATA:  Ileus, history of IVC stent placement EXAM: ABDOMEN - 1 VIEW COMPARISON:  04/20/2019 FINDINGS: Supine frontal view of the abdomen and pelvis excludes the left flank and lower pelvis by collimation. There is progressive gaseous distension of large and small bowel. IVC stent unchanged. No masses or abnormal calcifications. IMPRESSION: Progressive gaseous distension of large and small bowel consistent with ileus. Electronically Signed   By: Randa Ngo M.D.   On: 04/21/2019 08:42   DG Abd 1 View  Result Date: 04/20/2019 CLINICAL  DATA:  Diffuse abdominal tenderness, abdominal distension EXAM: ABDOMEN - 1 VIEW COMPARISON:  04/19/2019 FINDINGS: 2 supine frontal views of the abdomen and pelvis demonstrate persistent diffuse gaseous distention of the colon likely postoperative ileus. No masses or abnormal calcifications. Venous stent again noted. IMPRESSION: 1. Continued postoperative ileus. Electronically Signed   By: Randa Ngo M.D.   On: 04/20/2019 15:10   DG Abd 1 View  Result Date: 04/19/2019 CLINICAL DATA:  Evaluate ileus. EXAM: ABDOMEN - 1 VIEW COMPARISON:  April 19, 2019 (acquired at 8:04 a.m. FINDINGS: Numerous dilated air-filled loops of large bowel are again seen. This is unchanged in appearance when compared to the prior study. The visualized small bowel loops are normal in caliber. A radiopaque stent is seen overlying the medial aspect of the mid right abdomen. No radio-opaque calculi or other significant radiographic abnormality are seen. IMPRESSION: 1. Stable findings likely consistent with a postoperative ileus, unchanged in appearance when compared to the prior study dated April 19, 2019 (acquired at 8:04 a.m.). Electronically Signed   By: Virgina Norfolk M.D.   On: 04/19/2019 20:28   DG Abd 1 View  Result Date: 04/19/2019 CLINICAL DATA:  Generalized abdominal pain, postoperative ileus. EXAM: ABDOMEN - 1 VIEW COMPARISON:  April 18, 2019. FINDINGS: Stable dilated air-filled colon is noted most consistent with postoperative ileus. No significant small bowel dilatation is noted. Phleboliths are noted in the pelvis. IVC stent is again noted. IMPRESSION: Stable dilated air-filled colon is noted most  consistent with postoperative ileus. Electronically Signed   By: Marijo Conception M.D.   On: 04/19/2019 09:14   CT ANGIO CHEST PE W OR WO CONTRAST  Result Date: 04/19/2019 CLINICAL DATA:  Hypoxia. EXAM: CT ANGIOGRAPHY CHEST WITH CONTRAST TECHNIQUE: Multidetector CT imaging of the chest was performed using the standard  protocol during bolus administration of intravenous contrast. Multiplanar CT image reconstructions and MIPs were obtained to evaluate the vascular anatomy. CONTRAST:  126mL OMNIPAQUE IOHEXOL 300 MG/ML SOLN, 24mL OMNIPAQUE IOHEXOL 350 MG/ML SOLN COMPARISON:  04/11/2019 FINDINGS: Cardiovascular: Evaluation for acute pulmonary emboli is severely limited by respiratory motion artifact and suboptimal contrast bolus timing. Given these limitations, no large centrally located pulmonary embolism was detected.Detection of smaller pulmonary emboli is not possible on this exam. The main pulmonary artery is within normal limits for size. There is no CT evidence of acute right heart strain. The visualized aorta is normal. Heart size is normal, without pericardial effusion. Mediastinum/Nodes: --No mediastinal or hilar lymphadenopathy. --No axillary lymphadenopathy. --there is an enlarged left supraclavicular lymph node measuring 1.4 cm (axial series 5, image 5). --Normal thyroid gland. --The esophagus is unremarkable Lungs/Pleura: There is atelectasis at the lung bases bilaterally. There are new small bilateral pleural effusions, left greater than right. Upper Abdomen: No acute abnormality. Musculoskeletal: No chest wall abnormality. No acute or significant osseous findings. Review of the MIP images confirms the above findings. IMPRESSION: 1. Evaluation for acute pulmonary emboli is severely limited by respiratory motion artifact and suboptimal contrast bolus timing. Given these limitations, no large centrally located pulmonary embolism was detected. 2. New small bilateral pleural effusions, left greater than right. 3. Enlarged left supraclavicular lymph node measuring 1.4 cm. This may be malignant or reactive in etiology. It is amenable to percutaneous biopsy as clinically indicated. Electronically Signed   By: Constance Holster M.D.   On: 04/19/2019 23:17   CT ABDOMEN PELVIS W CONTRAST  Result Date: 04/18/2019 CLINICAL  DATA:  Testicular carcinoma post RIGHT orchiectomy, LEFT lower extremity DVT, abdominal distension, recent CT with intra-abdominal and retroperitoneal lymphadenopathy concerning for metastatic cancer EXAM: CT ABDOMEN AND PELVIS WITH CONTRAST TECHNIQUE: Multidetector CT imaging of the abdomen and pelvis was performed using the standard protocol following bolus administration of intravenous contrast. Sagittal and coronal MPR images reconstructed from axial data set. CONTRAST:  183mL OMNIPAQUE IOHEXOL 300 MG/ML SOLN IV. No oral contrast. COMPARISON:  04/11/2019 FINDINGS: Lower chest: Dependent bibasilar atelectasis in the lower lobes Hepatobiliary: New dependent density within gallbladder question vicarious excretion of contrast material from prior CT exam versus sludge. No gallbladder wall thickening. Liver unremarkable. Pancreas: Normal appearance Spleen: Normal appearance Adrenals/Urinary Tract: Adrenal glands, kidneys, and ureters normal appearance. Foley catheter and air within urinary bladder. Stomach/Bowel: Retrocecal appendix, normal. Gaseous distention of colon from cecum to descending colon. Small amount of stool in fluid within proximal colon. Tapering of the distal colon with few scattered distal colonic diverticula but no evidence of diverticulitis. Rectosigmoid colon decompressed. No discrete point of obstruction seen; findings favor ileus. Stomach and small bowel loops unremarkable Vascular/Lymphatic: Prior stenting of the IVC. Aorta normal caliber without aneurysm. IVC, iliac veins, common femoral veins patent without thrombus. Previously identified thrombus LEFT external iliac vein no longer seen. Again identified significant retroperitoneal adenopathy unchanged. Reproductive: Seminal vesicles unremarkable. Minimal prostatic prominence for age. Other: Scattered stranding of retroperitoneal tissue planes increased from previous exam. No free air or free fluid. Tiny LEFT inguinal hernia containing fat  Musculoskeletal: Osseous structures unremarkable. New enlargement of  the LEFT psoas muscle with in ill-defined area of central low attenuation approximately 3.7 x 3.9 cm in axial dimensions extending 12 cm length, could represent LEFT psoas abscess or hematoma. This is new since 04/11/2019 making tumor infiltration unlikely. IMPRESSION: Mild gaseous distention of the colon through descending colon with decompressed rectosigmoid colon; no point of obstruction is identified, favor ileus. Persistent retroperitoneal adenopathy suspicious for metastatic disease. New ill-defined fluid collection within LEFT psoas muscle 3.7 x 3.9 x 12 cm question psoas abscess versus hematoma; LEFT psoas muscle was traversed by prior CT guided retroperitoneal lymph node biopsy. Increased retroperitoneal and mesenteric stranding. Bibasilar atelectasis. Findings called to Dr. Zigmund Daniel on 04/18/2019 at 1108 hours. Electronically Signed   By: Lavonia Dana M.D.   On: 04/18/2019 11:08   DG Chest Port 1 View  Result Date: 04/18/2019 CLINICAL DATA:  Fever, testicular cancer EXAM: PORTABLE CHEST 1 VIEW COMPARISON:  None. FINDINGS: Normal heart size. Normal mediastinal contour. No pneumothorax. No pleural effusion. Slightly low lung volumes. Minimal streaky opacities at both lung bases. No pulmonary edema. IMPRESSION: Slightly low lung volumes. Minimal streaky opacities at the lung bases, favoring minimal atelectasis. No consolidative airspace disease to suggest a pneumonia. Electronically Signed   By: Ilona Sorrel M.D.   On: 04/18/2019 15:41   DG Abd Portable 1V  Result Date: 04/18/2019 CLINICAL DATA:  Abdominal pain and distension EXAM: PORTABLE ABDOMEN - 1 VIEW COMPARISON:  04/11/2019 FINDINGS: Supine frontal view of the abdomen and pelvis excludes the hemidiaphragms, right flank, and lower pelvis by collimation. There is diffuse gaseous distention of the colon, likely reflecting postoperative ileus. Stable position of the IVC stent. No  masses or abnormal calcifications. IMPRESSION: 1. Distended gas-filled colon consistent with postprocedural ileus. 2. IVC stent as above Electronically Signed   By: Randa Ngo M.D.   On: 04/18/2019 08:41   ECHOCARDIOGRAM COMPLETE  Result Date: 04/19/2019   ECHOCARDIOGRAM REPORT   Patient Name:   KRISTINE TIGNOR Date of Exam: 04/19/2019 Medical Rec #:  [id number]      Height:       75.0 in Accession #:    [id number]     Weight:       216.9 lb Date of Birth:  Jul 20, 1980       BSA:          2.27 m Patient Age:    38 years       BP:           132/83 mmHg Patient Gender: M              HR:           125 bpm. Exam Location:  Inpatient Procedure: 2D Echo, Color Doppler and Cardiac Doppler                          MODIFIED REPORT: This report was modified by Cherlynn Kaiser MD on 04/19/2019 due to.  Indications:     Fever  History:         Patient has no prior history of Echocardiogram examinations.                  Testicular cancer; Signs/Symptoms:Fever.  Sonographer:     Johny Chess Referring Phys:  [id number] Noland Fordyce MATHEWS Diagnosing Phys: Cherlynn Kaiser MD  Sonographer Comments: Suboptimal apical window. IMPRESSIONS  1. Left ventricular ejection fraction, by visual estimation, is 60 to 65%. The left ventricle has normal function. Mildly  increased left ventricular posterior wall thickness. There is mildly increased left ventricular hypertrophy.  2. Global right ventricle has normal systolic function.The right ventricular size is normal. Right vetricular wall thickness was not assessed.  3. The mitral valve is normal in structure. No evidence of mitral valve regurgitation. No evidence of mitral stenosis.  4. The tricuspid valve is normal in structure. Tricuspid valve regurgitation is not demonstrated.  5. The aortic valve is normal in structure. Aortic valve regurgitation is not visualized. No evidence of aortic valve sclerosis or stenosis.  6. The inferior vena cava is normal in size with greater than 50%  respiratory variability, suggesting right atrial pressure of 3 mmHg.  7. No evidence of valvular vegetations on this transthoracic echocardiogram, however acoustic windows are suboptimal. FINDINGS  Left Ventricle: Left ventricular ejection fraction, by visual estimation, is 60 to 65%. The left ventricle has normal function. The left ventricle is not well visualized. Mildly increased left ventricular posterior wall thickness. There is mildly increased left ventricular hypertrophy. Left ventricular diastolic parameters were normal. Normal left atrial pressure. Right Ventricle: The right ventricular size is normal. Right vetricular wall thickness was not assessed. Global RV systolic function is has normal systolic function. Left Atrium: Left atrial size was normal in size. Right Atrium: Right atrial size was normal in size Pericardium: There is no evidence of pericardial effusion. Mitral Valve: The mitral valve is normal in structure. No evidence of mitral valve regurgitation. No evidence of mitral valve stenosis by observation. Tricuspid Valve: The tricuspid valve is normal in structure. Tricuspid valve regurgitation is not demonstrated. Aortic Valve: The aortic valve is normal in structure. Aortic valve regurgitation is not visualized. The aortic valve is structurally normal, with no evidence of sclerosis or stenosis. Pulmonic Valve: The pulmonic valve was normal in structure. Pulmonic valve regurgitation is not visualized. Pulmonic regurgitation is not visualized. Aorta: The aortic root is normal in size and structure. Venous: The inferior vena cava is normal in size with greater than 50% respiratory variability, suggesting right atrial pressure of 3 mmHg. IAS/Shunts: The interatrial septum was not well visualized. Additional Comments: No evidence of valvular vegetations on this transthoracic echocardiogram. Would recommend a transesophageal echocardiogram to exclude infective endocarditis if clinically indicated.   LEFT VENTRICLE PLAX 2D LVIDd:         5.00 cm  Diastology LVIDs:         3.10 cm  LV e' lateral: 14.10 cm/s LV PW:         1.10 cm  LV e' medial:  12.30 cm/s LV IVS:        0.80 cm LVOT diam:     2.40 cm LV SV:         80 ml LV SV Index:   35.02 LVOT Area:     4.52 cm  RIGHT VENTRICLE RV S prime:     27.10 cm/s TAPSE (M-mode): 2.8 cm LEFT ATRIUM           Index LA diam:      3.40 cm 1.50 cm/m LA Vol (A4C): 38.8 ml 17.08 ml/m  AORTIC VALVE LVOT Vmax:   105.00 cm/s LVOT Vmean:  74.200 cm/s LVOT VTI:    0.167 m  AORTA Ao Root diam: 3.60 cm  SHUNTS Systemic VTI:  0.17 m Systemic Diam: 2.40 cm  Cherlynn Kaiser MD Electronically signed by Cherlynn Kaiser MD Signature Date/Time: 04/19/2019/7:59:29 PM    Final (Updated)    [place] EKG SITE RITE  Result Date: 04/21/2019 If  Site Rite image not attached, placement could not be confirmed due to current cardiac rhythm.   Labs:  CBC: Recent Labs    04/18/19 1051 04/19/19 0151 04/20/19 0231 04/21/19 0409  WBC 14.1* 13.2* 8.4 10.1  HGB 8.3* 8.5* 7.3* 7.0*  HCT 25.4* 26.0* 22.6* 21.1*  PLT 254 337 398 453*    COAGS: Recent Labs    04/11/19 1258  INR 1.0    BMP: Recent Labs    04/18/19 1051 04/19/19 0151 04/20/19 0231 04/21/19 0409  NA 132* 134* 136 137  K 4.2 3.9 3.4* 3.5  CL 99 102 106 103  CO2 23 22 19* 20*  GLUCOSE 105* 107* 116* 107*  BUN 6 6 8 11   CALCIUM 7.7* 7.6* 7.6* 7.7*  CREATININE 0.91 0.95 1.01 1.33*  GFRNONAA >60 >60 >60 >60  GFRAA >60 >60 >60 >60    LIVER FUNCTION TESTS: Recent Labs    04/18/19 1051 04/19/19 0151 04/20/19 0231 04/21/19 0409  BILITOT 1.0 1.0 1.2 1.1  AST 30 30 33 35  ALT 19 24 26 28   ALKPHOS 89 84 78 69  PROT 5.5* 6.1* 6.1* 5.9*  ALBUMIN 2.3* 2.3* 2.3* 2.3*    Assessment and Plan: Seminoma, 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 Tmax 101.5 WBC normalized; 10.4 Hemoglobin decreased to 7.0, however could be  dilutional.   Dark urine Patient with dark-colored urine which is improved today.  Discoloration is likely from breakdown of blood products.  SCr 1.33 today.  IVC occlusion, bilateral pelvic DVT, LLE DVT s/p mechanical thrombectomy and catheter-directed thrombolysis with stent placement in the mid/distal IVC  Swelling in his LLE has significantly improved since admission, now stable with mild residual swelling extending to knee.    Able to start clears today after BMs this AM.   Plan as previously outlined: VIR has discussed Mr Harcum's current case and care with the [place] department at [facility].   Dr. Lavon Paganini, Assistant Professor of Radiology, has kindly agreed to see Mr Rierson in continuity of his current vascular care, when Mr Dacres returns home.  Scheduling: [place]: [phone]  Dr. Ottis Stain has also kindly arranged follow-up with the Medical Oncology Department of [facility], and Mr Wille should have a pending office appointment upon arrival.   Our VIR recommendations on DC from [place]: - Continue anti-coagulation with 30 days of weight-based lovenox - after 30 days of weight-based lovenox, transition to oral anticoagulation of managing physician's choice - 90 days of 81mg  ASA daily - Bilateral lower extremity compression stockings, at least knee-high - please have the patient bring a copy of current medical records with him to facilitate care on DC   Electronically Signed: Docia Barrier, PA 04/21/2019, 3:35 PM   I spent a total of 25 Minutes at the the patient's bedside AND on the patient's hospital floor or unit, greater than 50% of which was counseling/coordinating care for IVC occlusion, bilateral pelvic DVT, LLE DVT s/p mechanical thrombectomy and catheter-directed thrombolysis with stent placement in the mid/distal IVC

## 2019-04-21 NOTE — Progress Notes (Signed)
Brief oncology note:  I spoke with the patient by telephone this morning.  Advised him of recommendation for Port-A-Cath placement, but he does not want to proceed with this at this time.  We discussed PICC line placement for chemotherapy and he seems agreeable to this.  Will place the order this morning for PICC line placement prior to chemotherapy on Monday.  I also advised him of recommendation for 1 unit PRBCs today for hemoglobin of 7.0.  He declines blood transfusion.  We will plan to see him this afternoon during rounds at [facility].  Full progress note to follow.  [name], DNP, AGPCNP-BC, AOCNP

## 2019-04-21 NOTE — Progress Notes (Signed)
Inpatient Rehabilitation-Admissions Coordinator   Followed up with pt at the bedside. Pt is interested in pursuing the IP Rehab program once medically ready. We discussed that [place] [facility] will follow up with him on Monday as it looks like he may be transferring to [facility] for start of chemo.   If pt ends up admitted to CIR next week, AC will need to discuss with oncology to determine chemo schedule and work out transportation to/from [employer].   Raechel Ache, OTR/L  Rehab Admissions Coordinator  [phone] 04/21/2019 11:44 AM   Raechel Ache, OTR/L  Rehab Admissions Coordinator  [phone] 04/21/2019 11:44 AM

## 2019-04-21 NOTE — Progress Notes (Signed)
At bedside to obtain consent for PICC.  Patient very sleepy and states that he wouldn't remember anything that he was told because he is half asleep.  IV team will return tomorrow for consent and PICC insertion, patient has IV access for needs at present.  Carolee Rota, RN

## 2019-04-21 NOTE — Progress Notes (Signed)
Physical Therapy Treatment Patient Details Name: Timothy [name] MRN: [id number] DOB: 02/17/1981 Today's Date: 04/21/2019    History of Present Illness Pt is a 39 y.o. male admitted 04/10/19 with LLE swelling and back pain; pt lives in [place] but drove down to [place] to visit mother ~1 wk ago. Found to have extensive LLE DVT; also with significant lymphadenopathy in the retroperitoneal area concerning for metastatic disease of unknown primary. Biopsy consistent with metastatic seminoma. S/p mechanical and pharmacological thrombolysis with stenting of the IVC for resistant malignant narrowing. Pt with ilieus 2/1. 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    PT Comments    Pt fatigued upon arrival, but agreeable to OOB mobility with PT/OT. Pt ambulated room distance requiring min-mod assist +2 for mobility. Pt fatigues quickly, requires rest breaks and will require them moving forward to increase tolerance for activity. PT to continue to follow acutely, continuing to recommend CIR to return to independence with mobility.    Follow Up Recommendations  Supervision for mobility/OOB;CIR     Equipment Recommendations  Rolling walker with 5" wheels    Recommendations for Other Services       Precautions / Restrictions Precautions Precautions: Fall Precaution Comments: weeping blisters on posterior knee Restrictions Weight Bearing Restrictions: No    Mobility  Bed Mobility Overal bed mobility: Needs Assistance Bed Mobility: Supine to Sit;Sit to Supine     Supine to sit: HOB elevated;Min assist;+2 for safety/equipment;+2 for physical assistance     General bed mobility comments: Min-mod assist for supine<>sit for BLE lifting L>R and trunk management in and out of bed. Pt with use of compensatory hooking RLE under LLE to assist in lift.  Transfers Overall transfer level: Needs assistance Equipment used: Rolling walker (2 wheeled) Transfers: Sit  to/from Stand Sit to Stand: Mod assist         General transfer comment: Mod assist for power up, steadying, hip extension, verbal cuing for upright posture and hand placement when rising.  Ambulation/Gait Ambulation/Gait assistance: Min assist;+2 safety/equipment Gait Distance (Feet): 25 Feet Assistive device: Rolling walker (2 wheeled) Gait Pattern/deviations: Step-through pattern;Decreased stride length;Trunk flexed;Narrow base of support Gait velocity: decr   General Gait Details: Min assist +2 for lines/leads, steadying. Verbal cuing for upright posture x3, placement in RW. Pt with short, shuffling steps and narrow BOS.   Stairs             Wheelchair Mobility    Modified Rankin (Stroke Patients Only)       Balance Overall balance assessment: Needs assistance Sitting-balance support: No upper extremity supported;Single extremity supported Sitting balance-Leahy Scale: Good Sitting balance - Comments: able to sit EOB without PT support   Standing balance support: During functional activity Standing balance-Leahy Scale: Poor Standing balance comment: reliant on RW in standing                            Cognition Arousal/Alertness: Awake/alert Behavior During Therapy: WFL for tasks assessed/performed Overall Cognitive Status: Within Functional Limits for tasks assessed                                        Exercises General Exercises - Lower Extremity Heel Slides: Both;10 reps;AAROM;Supine    General Comments        Pertinent Vitals/Pain Pain Assessment: Faces Faces Pain  Scale: Hurts little more Pain Location: LEs, L>R Pain Descriptors / Indicators: Sore;Aching;Guarding;Grimacing;Sharp Pain Intervention(s): Limited activity within patient's tolerance;Monitored during session;Premedicated before session;Repositioned    Home Living                      Prior Function            PT Goals (current goals can now  be found in the care plan section) Acute Rehab PT Goals Patient Stated Goal: to get back home and to PLOF PT Goal Formulation: With patient Time For Goal Achievement: 05/02/19 Potential to Achieve Goals: Good Progress towards PT goals: Progressing toward goals    Frequency    Min 3X/week      PT Plan Current plan remains appropriate    Co-evaluation PT/OT/SLP Co-Evaluation/Treatment: Yes Reason for Co-Treatment: For patient/therapist safety;To address functional/ADL transfers PT goals addressed during session: Mobility/safety with mobility;Strengthening/ROM        AM-PAC PT "6 Clicks" Mobility   Outcome Measure  Help needed turning from your back to your side while in a flat bed without using bedrails?: A Lot Help needed moving from lying on your back to sitting on the side of a flat bed without using bedrails?: A Lot Help needed moving to and from a bed to a chair (including a wheelchair)?: A Lot Help needed standing up from a chair using your arms (e.g., wheelchair or bedside chair)?: A Lot Help needed to walk in hospital room?: A Little Help needed climbing 3-5 steps with a railing? : A Lot 6 Click Score: 13    End of Session Equipment Utilized During Treatment: Gait belt Activity Tolerance: Patient limited by fatigue;Patient limited by pain Patient left: with call bell/phone within reach;in bed;with family/visitor present Nurse Communication: Mobility status PT Visit Diagnosis: Other abnormalities of gait and mobility (R26.89);Pain;Difficulty in walking, not elsewhere classified (R26.2) Pain - Right/Left: (groin)     Time: 1535-1610 PT Time Calculation (min) (ACUTE ONLY): 35 min  Charges:  $Gait Training: 8-22 mins                     Mickel Schreur E, [place] Pager [phone]  Office [phone]    Roxine Caddy D Elonda Husky 04/21/2019, 5:27 PM

## 2019-04-21 NOTE — Progress Notes (Signed)
ANTICOAGULATION CONSULT NOTE Pharmacy Consult for Heparin Indication: DVT  No Known Allergies  Patient Measurements: Height: 6\' 3"  (190.5 cm) Weight: 216 lb 14.9 oz (98.4 kg) IBW/kg (Calculated) : 84.5  Vital Signs: Temp: 99.1 F (37.3 C) (02/05 0634) Temp Source: Oral (02/05 0634) BP: 150/98 (02/05 0634) Pulse Rate: 115 (02/05 0634)  Labs: Recent Labs    04/19/19 0151 04/19/19 0151 04/20/19 0231 04/21/19 0409  HGB 8.5*   < > 7.3* 7.0*  HCT 26.0*  --  22.6* 21.1*  PLT 337  --  398 453*  HEPARINUNFRC 0.52  --  0.60 0.62  CREATININE 0.95  --  1.01 1.33*  CKTOTAL  --   --  624*  --    < > = values in this interval not displayed.    Estimated Creatinine Clearance: 91.8 mL/min (A) (by C-G formula based on SCr of 1.33 mg/dL (H)).   Assessment: 39 yo M with DVT s/p thrombectomy/lysis, on heparin drip at 2600 units/hr  2/3 AM  update:  Heparin level therapeutic at 0.62, no overt bleeding noted, however Hgb continues to drop 8.5>>7.3>>7. Would monitor. PLTC stable  Goal of Therapy: Heparin level 0.3-0.7 units/ml Monitor platelets by anticoagulation protocol: Yes  Plan: Continue heparin at 2600 units/hr Daily Heparin level/CBC  Jannetta Massey A. Levada Dy, PharmD, BCPS, FNKF Clinical Pharmacist [place] Please utilize Amion for appropriate phone number to reach the unit pharmacist ([place])

## 2019-04-21 NOTE — Progress Notes (Signed)
PT Cancellation Note  Patient Details Name: Timothy [name] MRN: [id number] DOB: 1981-02-28   Cancelled Treatment:    Reason Eval/Treat Not Completed: Fatigue/lethargy limiting ability to participate - Pt reports fatigue this am, requesting PT to check back this pm as time allows.   [place] Pager [phone]  Office [phone]    [place] 04/21/2019, 10:44 AM

## 2019-04-21 NOTE — Progress Notes (Signed)
Occupational Therapy Treatment Patient Details Name: Timothy [name] MRN: [id number] DOB: 17-Jun-1980 Today's Date: 04/21/2019    History of present illness Pt is a 39 y.o. male admitted 04/10/19 with LLE swelling and back pain; pt lives in [place] but drove down to  to visit mother ~1 wk ago. Found to have extensive LLE DVT; also with significant lymphadenopathy in the retroperitoneal area concerning for metastatic disease of unknown primary. Biopsy consistent with metastatic seminoma. S/p mechanical and pharmacological thrombolysis with stenting of the IVC for resistant malignant narrowing. Pt with ilieus 2/1. 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   OT comments  Pt progressing well toward stated goals. Noted pitting edema in lumbar spinal area this date causing pain. Educated pt on light stretches to help move fluid and bed positioning. Pt completed bed mobility at min A +2 and sit <> stands with mod A. He then completed functional mobility to the bathroom at min A +2 level to have BM and perform grooming tasks. CIR remains appropriate. Will continue to follow.   Follow Up Recommendations  CIR;Supervision/Assistance - 24 hour    Equipment Recommendations  3 in 1 bedside commode    Recommendations for Other Services      Precautions / Restrictions Precautions Precautions: Fall Precaution Comments: weeping blisters on posterior knee Restrictions Weight Bearing Restrictions: No       Mobility Bed Mobility Overal bed mobility: Needs Assistance Bed Mobility: Supine to Sit;Sit to Supine     Supine to sit: HOB elevated;Min assist;+2 for safety/equipment;+2 for physical assistance     General bed mobility comments: Min-mod assist for supine<>sit for BLE lifting L>R and trunk management in and out of bed. Pt with use of compensatory hooking RLE under LLE to assist in lift.  Transfers Overall transfer level: Needs assistance Equipment  used: Rolling walker (2 wheeled) Transfers: Sit to/from Stand Sit to Stand: Mod assist         General transfer comment: Mod assist for power up, steadying, hip extension, verbal cuing for upright posture and hand placement when rising.    Balance Overall balance assessment: Needs assistance Sitting-balance support: No upper extremity supported;Single extremity supported Sitting balance-Leahy Scale: Good Sitting balance - Comments: able to sit EOB without PT support   Standing balance support: During functional activity Standing balance-Leahy Scale: Poor Standing balance comment: reliant on RW in standing                           ADL either performed or assessed with clinical judgement   ADL Overall ADL's : Needs assistance/impaired     Grooming: Minimal assistance;Standing Grooming Details (indicate cue type and reason): at sink in bathroom to complete oral care                 Toilet Transfer: Minimal assistance;+2 for physical assistance;+2 for safety/equipment;BSC Toilet Transfer Details (indicate cue type and reason): to [facility] in [place], needing to sit on [facility] for rest break rather than going to toilet         Functional mobility during ADLs: Minimal assistance;+2 for physical assistance;+2 for safety/equipment;Rolling walker General ADL Comments: pt able to progress BADL mobility this date to bathroom level BADL tasks     Vision Patient Visual Report: No change from baseline     Perception     Praxis      Cognition Arousal/Alertness: Awake/alert Behavior During Therapy: [facility] for tasks assessed/performed  Overall Cognitive Status: Within Functional Limits for tasks assessed                                          Exercises General Exercises - Lower Extremity Heel Slides: Both;10 reps;AAROM;Supine   Shoulder Instructions       General Comments      Pertinent Vitals/ Pain       Pain Assessment: Faces Faces Pain Scale:  Hurts little more Pain Location: LEs, L>R Pain Descriptors / Indicators: Sore;Aching;Guarding;Grimacing;Sharp Pain Intervention(s): Limited activity within patient's tolerance;Monitored during session;Premedicated before session;Repositioned  Home Living                                          Prior Functioning/Environment              Frequency  Min 2X/week        Progress Toward Goals  OT Goals(current goals can now be found in the care plan section)  Progress towards OT goals: Progressing toward goals  Acute Rehab OT Goals Patient Stated Goal: to get back home and to PLOF OT Goal Formulation: With patient Time For Goal Achievement: 05/02/19 Potential to Achieve Goals: Good  Plan Discharge plan remains appropriate    Co-evaluation    PT/OT/SLP Co-Evaluation/Treatment: Yes Reason for Co-Treatment: For patient/therapist safety;To address functional/ADL transfers PT goals addressed during session: Mobility/safety with mobility;Strengthening/ROM;Proper use of DME OT goals addressed during session: ADL's and self-care;Strengthening/ROM      AM-PAC OT "6 Clicks" Daily Activity     Outcome Measure   Help from another person eating meals?: None Help from another person taking care of personal grooming?: A Little Help from another person toileting, which includes using toliet, bedpan, or urinal?: A Lot Help from another person bathing (including washing, rinsing, drying)?: A Lot Help from another person to put on and taking off regular upper body clothing?: None Help from another person to put on and taking off regular lower body clothing?: A Lot 6 Click Score: 17    End of Session Equipment Utilized During Treatment: Rolling walker;Gait belt  OT Visit Diagnosis: Other abnormalities of gait and mobility (R26.89);Pain Pain - Right/Left: Left Pain - part of body: Leg   Activity Tolerance Patient tolerated treatment well   Patient Left in  bed;with call bell/phone within reach;with bed alarm set   Nurse Communication Mobility status        Time: 1535-1610 OT Time Calculation (min): 35 min  Charges: OT General Charges $OT Visit: 1 Visit OT Treatments $Self Care/Home Management : 8-22 mins  Zenovia Jarred, MSOT, OTR/L Acute Rehabilitation Services [facility] Office Number: [phone]  Zenovia Jarred 04/21/2019, 6:28 PM

## 2019-04-22 ENCOUNTER — Other Ambulatory Visit: Payer: Self-pay | Admitting: Hematology

## 2019-04-22 ENCOUNTER — Inpatient Hospital Stay (HOSPITAL_COMMUNITY): Payer: BLUE CROSS/BLUE SHIELD

## 2019-04-22 DIAGNOSIS — R7989 Other specified abnormal findings of blood chemistry: Secondary | ICD-10-CM

## 2019-04-22 DIAGNOSIS — R509 Fever, unspecified: Secondary | ICD-10-CM

## 2019-04-22 LAB — COMPREHENSIVE METABOLIC PANEL
ALT: 28 U/L (ref 0–44)
AST: 37 U/L (ref 15–41)
Albumin: 2.2 g/dL — ABNORMAL LOW (ref 3.5–5.0)
Alkaline Phosphatase: 60 U/L (ref 38–126)
Anion gap: 10 (ref 5–15)
BUN: 13 mg/dL (ref 6–20)
CO2: 21 mmol/L — ABNORMAL LOW (ref 22–32)
Calcium: 7.5 mg/dL — ABNORMAL LOW (ref 8.9–10.3)
Chloride: 107 mmol/L (ref 98–111)
Creatinine, Ser: 1.46 mg/dL — ABNORMAL HIGH (ref 0.61–1.24)
GFR calc Af Amer: >60 mL/min (ref >60)
GFR calc non Af Amer: >60 mL/min (ref >60)
Glucose, Bld: 108 mg/dL — ABNORMAL HIGH (ref 70–99)
Potassium: 3 mmol/L — ABNORMAL LOW (ref 3.5–5.1)
Sodium: 138 mmol/L (ref 135–145)
Total Bilirubin: 0.7 mg/dL (ref 0.3–1.2)
Total Protein: 5.9 g/dL — ABNORMAL LOW (ref 6.5–8.1)

## 2019-04-22 LAB — CBC
HCT: 21.2 % — ABNORMAL LOW (ref 39.0–52.0)
Hemoglobin: 7 g/dL — ABNORMAL LOW (ref 13.0–17.0)
MCH: 29.7 pg (ref 26.0–34.0)
MCHC: 33 g/dL (ref 30.0–36.0)
MCV: 89.8 fL (ref 80.0–100.0)
Platelets: 488 10*3/uL — ABNORMAL HIGH (ref 150–400)
RBC: 2.36 MIL/uL — ABNORMAL LOW (ref 4.22–5.81)
RDW: 14 % (ref 11.5–15.5)
WBC: 15.8 10*3/uL — ABNORMAL HIGH (ref 4.0–10.5)
nRBC: 0.2 % (ref 0.0–0.2)

## 2019-04-22 LAB — CULTURE, BLOOD (ROUTINE X 2)
Culture: NO GROWTH
Culture: NO GROWTH
Special Requests: ADEQUATE

## 2019-04-22 LAB — IRON AND TIBC
Iron: 11 ug/dL — ABNORMAL LOW (ref 45–182)
Saturation Ratios: 7 % — ABNORMAL LOW (ref 17.9–39.5)
TIBC: 168 ug/dL — ABNORMAL LOW (ref 250–450)
UIBC: 157 ug/dL

## 2019-04-22 LAB — RETICULOCYTES
Immature Retic Fract: 33.5 % — ABNORMAL HIGH (ref 2.3–15.9)
RBC.: 2.3 MIL/uL — ABNORMAL LOW (ref 4.22–5.81)
Retic Count, Absolute: 66 10*3/uL (ref 19.0–186.0)
Retic Ct Pct: 2.9 % (ref 0.4–3.1)

## 2019-04-22 LAB — FERRITIN: Ferritin: 384 ng/mL — ABNORMAL HIGH (ref 24–336)

## 2019-04-22 LAB — HEPARIN LEVEL (UNFRACTIONATED): Heparin Unfractionated: 0.56 IU/mL (ref 0.30–0.70)

## 2019-04-22 MED ORDER — PRO-STAT SUGAR FREE PO LIQD
30.0000 mL | Freq: Two times a day (BID) | ORAL | Status: DC
  Administered 2019-04-22 – 2019-04-29 (×15): 30 mL via ORAL
  Filled 2019-04-22 (×16): qty 30

## 2019-04-22 MED ORDER — ADULT MULTIVITAMIN W/MINERALS CH
1.0000 | ORAL_TABLET | Freq: Every day | ORAL | Status: DC
  Administered 2019-04-22 – 2019-04-29 (×8): 1 via ORAL
  Filled 2019-04-22 (×8): qty 1

## 2019-04-22 MED ORDER — BOOST / RESOURCE BREEZE PO LIQD CUSTOM
1.0000 | Freq: Three times a day (TID) | ORAL | Status: DC
  Administered 2019-04-22 – 2019-04-26 (×15): 1 via ORAL

## 2019-04-22 NOTE — Progress Notes (Signed)
Spoke with Dr Zigmund Daniel Re PICC order.  New order to d/c PICC order until later time.

## 2019-04-22 NOTE — Progress Notes (Signed)
Spoke with RN re PICC.  States pt has adequate IV access at this time, PICC is needed for chemo starting on Monday.  Ok to transfer pt to [place] with or without PICC this weekend, as PICC can be placed at [facility] as well.

## 2019-04-22 NOTE — Progress Notes (Signed)
PROGRESS NOTE    Timothy [name]  [id number] DOB: [birth year] DOA: 04/10/2019 PCP: Patient, No Pcp Per  Brief Narrative: 39 year old male with history of testicular carcinoma stage Ib followed at [facility] had right orchiectomy lives in [place] travel to [place] now admitted with left lower extremity extensive DVT with intra-abdominal and retroperitoneal lymphadenopathy concerning for metastatic cancer. s/p retroperitoneal LN biopsy  Patient continues with ileus and spiking fever on and off.  No source of infection has identified yet. 04/22/2019 ambulated in the hallway Had a bowel movement today no nausea vomiting abdomen still distended. CT scan from 04/22/2019 shows persistent ileus, retroperitoneal hematoma and lymphadenopathy.  New mild right hydronephrosis increased compared to prior exam, mid to distal ureter presumably obstructed by adjacent lymph nodes.  Pleural effusions and anasarca.  Assessment & Plan:   Principal Problem:   DVT (deep venous thrombosis) (HCC) Active Problems:   H/O testicular cancer   Lymphadenopathy, retroperitoneal   Seminoma ([place])   Acute deep vein thrombosis (DVT) of left lower extremity (HCC)   Elevated serum creatinine   Abdominal distension   Abscess after procedure   Fever    #1 acute left lower extremity DVTwith bulky retroperitoneal lymphadenopathy and IVC occlusionin the setting of testicular cancer-status post mechanical and pharmacological thrombolysis with stenting of the IVC for resistant malignant narrowing. Continue heparinfor now. Will switch to Lovenox 1 mg/kg every 12 closer to when he is ready for discharge. Lovenox to be continued for 30 days once discharged then transition to oral anticoagulation 90 days of aspirin 81 mg daily Bilateral lower extremity compression stockings at least knee-high Out of bed ambulate  Will DC Foley once he is able to ambulate  #2 fever with tachycardia-source of infection not yet  known. ?tumor fever. Likely tumor fever he has been on antibiotics for multiple days now with negative cultures. He is on flagyl and cefepime. vanco stopped 2/4 He is still spiking temp 101.6 today Discussed with ID and PCCM. Echo normal ejection fraction, no evidence of valvular infection CT chest no evidence of PE CPK 624, LDH 278, CRP 25.5, procalcitonin 0.42, white count 8.4, sed rate 100, D-dimer 7.28. He was Covid negative on admission. Discussed with ID recommends no need to repeat Covid test.  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 CT abdomen and pelvis-shows ileus and psoas fluid collection likely hematoma. Discussed with IR . They think this fluid collection is more likely to be hematoma than an abscess and not recommending I&D at this time.  Repeat CT of the abdomen and pelvis without contrast on 04/22/2019 shows-new right hydronephrosis with retroperitoneal hematoma retroperitoneal lymphadenopathy and worsening ileus.  #4 metastatic testicular cancer-oncology following.  Plan was to start chemo once he is back in his hometown [place] to follow-up at [facility].  #5 ileus with abdominal distention and abdominal tenderness. Patient had another bowel movement last night. We'll start him on clear liquid diet.  CT scan still with ileus.  Do not advance diet.    #6 anemia likely secondary to hemodilution and probably related to psoas hematoma and hematuria. Patient is refusing blood transfusion  #7 Elevated creatinine-likely secondary to contrast with new right hydronephrosis.  Discussed with Dr. Louis Meckel urology he will see the patient tomorrow. Meanwhile I will repeat a renal ultrasound.  Per Dr. Stephenie Acres. Lavon Paganini, Assistant Professor of Radiology, has kindly agreed to see Timothy [name] in continuity of his current vascular care,  when Timothy Emert returns home.    Scheduling: [place]: [phone]  Dr. Ottis Stain has also kindly arranged follow-up with the Medical Oncology Department of [facility], and Timothy Capehart should have a pending office appointment upon arrival.   Nutrition Problem: Increased nutrient needs Etiology: cancer and cancer related treatments     Signs/Symptoms: estimated needs    Interventions: Boost Breeze, MVI  Estimated body mass index is 27.11 kg/m as calculated from the following:   Height as of this encounter: 6\' 3"  (1.905 m).   Weight as of this encounter: 98.4 kg.   DVT prophylaxis:IV heparin Code Status:Full code Family Communication:Discussed with patientand fianc at bedside. disposition Plan:Patient came from home plan is to return to home. Barriers to discharge-patient spiking fever and tachycardic with unknown source of infection and currently have postop ileus, hematuria  Consultants:  Interventional radiology, PCCM  Procedures:CT-guided biopsy of the lymph node and mechanical and thrombolytic intervention to left lower extremity for acute DVT Antimicrobials: Flagyl and cefepime   Subjective: Ambulating in hallway No nausea vomiting Abdomen remains distended  Objective: Vitals:   04/22/19 0217 04/22/19 0625 04/22/19 1020 04/22/19 1321  BP: 126/78 133/78 132/82 (!) 144/94  Pulse: (!) 110 (!) 113 (!) 113 (!) 115  Resp: 18 18 18 18   Temp: 98.7 F (37.1 C) (!) 101 F (38.3 C) (!) 101.1 F (38.4 C) (!) 101.6 F (38.7 C)  TempSrc: Oral Oral Oral Oral  SpO2: 92% 98% 96% 98%  Weight:      Height:        Intake/Output Summary (Last 24 hours) at 04/22/2019 1502 Last data filed at 04/22/2019 1307 Gross per 24 hour  Intake 3372 ml  Output 1500 ml  Net 1872 ml   Filed Weights   04/10/19 2005 04/18/19 0600  Weight: 90.7 kg 98.4 kg    Examination:  General exam: Appears calm and comfortable  Respiratory system: Decreased breath sounds at the bases to  auscultation. Respiratory effort normal. Cardiovascular system: S1 & S2 heard, RRR. No JVD, murmurs, rubs, gallops or clicks. No pedal edema. Gastrointestinal system: Abdomen is distended, soft and nontender. No organomegaly or masses felt. Normal bowel sounds heard. Central nervous system: Alert and oriented. No focal neurological deficits. Extremities: 2+ left lower extremity edema  Skin: No rashes, lesions or ulcers Psychiatry: Judgement and insight appear normal. Mood & affect appropriate.     Data Reviewed: I have personally reviewed following labs and imaging studies  CBC: Recent Labs  Lab 04/17/19 0018 04/17/19 0018 04/18/19 1051 04/19/19 0151 04/20/19 0231 04/21/19 0409 04/22/19 0500  WBC 11.4*   < > 14.1* 13.2* 8.4 10.1 15.8*  NEUTROABS 9.0*  --   --   --  6.3  --   --   HGB 11.0*   < > 8.3* 8.5* 7.3* 7.0* 7.0*  HCT 32.8*   < > 25.4* 26.0* 22.6* 21.1* 21.2*  MCV 88.9   < > 92.0 91.9 91.5 89.8 89.8  PLT 215   < > 254 337 398 453* 488*   < > = values in this interval not displayed.   Basic Metabolic Panel: Recent Labs  Lab 04/18/19 1051 04/19/19 0151 04/20/19 0231 04/20/19 1038 04/21/19 0409 04/22/19 0500  NA 132* 134* 136  --  137 138  K 4.2 3.9 3.4*  --  3.5 3.0*  CL 99 102 106  --  103 107  CO2 23 22 19*  --  20* 21*  GLUCOSE 105* 107*  116*  --  107* 108*  BUN 6 6 8   --  11 13  CREATININE 0.91 0.95 1.01  --  1.33* 1.46*  CALCIUM 7.7* 7.6* 7.6*  --  7.7* 7.5*  MG  --   --   --  2.5*  --   --    GFR: Estimated Creatinine Clearance: 83.6 mL/min (A) (by C-G formula based on SCr of 1.46 mg/dL (H)). Liver Function Tests: Recent Labs  Lab 04/18/19 1051 04/19/19 0151 04/20/19 0231 04/21/19 0409 04/22/19 0500  AST 30 30 33 35 37  ALT 19 24 26 28 28   ALKPHOS 89 84 78 69 60  BILITOT 1.0 1.0 1.2 1.1 0.7  PROT 5.5* 6.1* 6.1* 5.9* 5.9*  ALBUMIN 2.3* 2.3* 2.3* 2.3* 2.2*   No results for input(s): LIPASE, AMYLASE in the last 168 hours. No results for  input(s): AMMONIA in the last 168 hours. Coagulation Profile: No results for input(s): INR, PROTIME in the last 168 hours. Cardiac Enzymes: Recent Labs  Lab 04/20/19 0231  CKTOTAL 624*   BNP (last 3 results) No results for input(s): PROBNP in the last 8760 hours. HbA1C: No results for input(s): HGBA1C in the last 72 hours. CBG: Recent Labs  Lab 04/16/19 0729 04/16/19 1124  GLUCAP 115* 86   Lipid Profile: No results for input(s): CHOL, HDL, LDLCALC, TRIG, CHOLHDL, LDLDIRECT in the last 72 hours. Thyroid Function Tests: No results for input(s): TSH, T4TOTAL, FREET4, T3FREE, THYROIDAB in the last 72 hours. Anemia Panel: Recent Labs    04/22/19 0500 04/22/19 0503  FERRITIN 384*  --   TIBC 168*  --   IRON 11*  --   RETICCTPCT  --  2.9   Sepsis Labs: Recent Labs  Lab 04/18/19 1700 04/18/19 1822 04/18/19 2105 04/19/19 0151 04/20/19 0231  PROCALCITON 0.42 0.46  --  0.51 0.42  LATICACIDVEN  --  1.3 1.2  --   --     Recent Results (from the past 240 hour(s))  Culture, blood (routine x 2)     Status: None   Collection Time: 04/17/19 10:15 AM   Specimen: BLOOD RIGHT HAND  Result Value Ref Range Status   Specimen Description BLOOD RIGHT HAND  Final   Special Requests   Final    BOTTLES DRAWN AEROBIC ONLY Blood Culture results may not be optimal due to an inadequate volume of blood received in culture bottles   Culture   Final    NO GROWTH 5 DAYS Performed at [facility], [place] [address], [place], [place] [address]    Report Status 04/22/2019 FINAL  Final  Culture, blood (routine x 2)     Status: None   Collection Time: 04/17/19 10:20 AM   Specimen: BLOOD  Result Value Ref Range Status   Specimen Description BLOOD LEFT ANTECUBITAL  Final   Special Requests   Final    BOTTLES DRAWN AEROBIC AND ANAEROBIC Blood Culture adequate volume   Culture   Final    NO GROWTH 5 DAYS Performed at [facility], [place] [address], [place], [place] [address]    Report  Status 04/22/2019 FINAL  Final  Culture, Urine     Status: None   Collection Time: 04/17/19 10:43 AM   Specimen: Urine, Random  Result Value Ref Range Status   Specimen Description URINE, RANDOM  Final   Special Requests NONE  Final   Culture   Final    NO GROWTH Performed at [facility],  [address], [place], [place] [address]    Report Status 04/18/2019 FINAL  Final         Radiology Studies: CT ABDOMEN PELVIS WO CONTRAST  Result Date: 04/22/2019 CLINICAL DATA:  Fever, abdominal distension, testicular cancer, status post right orchiectomy EXAM: CT ABDOMEN AND PELVIS WITHOUT CONTRAST TECHNIQUE: Multidetector CT imaging of the abdomen and pelvis was performed following the standard protocol without IV contrast. COMPARISON:  Abdominal radiographs, 04/21/2019, CT abdomen pelvis, 04/17/2018, 04/11/2019 FINDINGS: Lower chest: Unchanged small bilateral pleural effusions and associated atelectasis or consolidation. Hepatobiliary: No solid liver abnormality is seen. No gallstones, gallbladder wall thickening, or biliary dilatation. Pancreas: Unremarkable. No pancreatic ductal dilatation or surrounding inflammatory changes. Spleen: Normal in size without significant abnormality. Adrenals/Urinary Tract: Adrenal glands are unremarkable. Mild right hydronephrosis, slightly increased compared to prior examination. Foley catheter in the urinary bladder. Stomach/Bowel: Stomach is within normal limits. The colon remains diffusely gas and fluid-filled to the rectum, caliber of the colon slightly increased, measuring up to 9 cm in the transverse colon. Sigmoid diverticulosis. Vascular/Lymphatic: Stent of the infrarenal IVC. Unchanged bulky retroperitoneal lymphadenopathy, largest left retroperitoneal nodes measuring up to 3.5 x 2.8 cm (series 3, image 60). Reproductive: No mass or other significant abnormality. Other: Anasarca. Retroperitoneal hematomas in the left psoas (series 3, image 57) and in the  retroperitoneal fat posterior to the right psoas (series 3, image 66), not significantly changed. Musculoskeletal: No acute or significant osseous findings. IMPRESSION: 1. The colon remains diffusely gas and fluid-filled to the rectum, slightly increased in caliber compared to prior examination. Findings favor ileus. No specific findings to suggest infectious or inflammatory colitis. 2. Retroperitoneal hematomas in the left psoas and retroperitoneal fat posterior to the right psoas are not significantly changed. 3. Unchanged bulky retroperitoneal lymphadenopathy. 4. Mild right hydronephrosis, slightly increased compared to prior examination, the mid to distal ureter presumably obstructed by adjacent lymph nodes. 5. Pleural effusions and anasarca. 6. Status post right orchiectomy. Electronically Signed   By: Eddie Candle M.D.   On: 04/22/2019 13:21   DG Abd 1 View  Result Date: 04/21/2019 CLINICAL DATA:  Ileus, history of IVC stent placement EXAM: ABDOMEN - 1 VIEW COMPARISON:  04/20/2019 FINDINGS: Supine frontal view of the abdomen and pelvis excludes the left flank and lower pelvis by collimation. There is progressive gaseous distension of large and small bowel. IVC stent unchanged. No masses or abnormal calcifications. IMPRESSION: Progressive gaseous distension of large and small bowel consistent with ileus. Electronically Signed   By: Randa Ngo M.D.   On: 04/21/2019 08:42   [place] EKG SITE RITE  Result Date: 04/21/2019 If Site Rite image not attached, placement could not be confirmed due to current cardiac rhythm.       Scheduled Meds: . Chlorhexidine Gluconate Cloth  6 each Topical Daily  . feeding supplement  1 Container Oral TID BM  . feeding supplement (PRO-STAT SUGAR FREE 64)  30 mL Oral BID  . multivitamin with minerals  1 tablet Oral Daily  . pantoprazole (PROTONIX) IV  40 mg Intravenous Q12H  . polyethylene glycol  17 g Oral Daily  . sodium chloride flush  3 mL Intravenous Q12H    Continuous Infusions: . sodium chloride 50 mL/hr at 04/16/19 1200  . sodium chloride 100 mL/hr at 04/18/19 2231  . sodium chloride 100 mL/hr at 04/22/19 0511  . ceFEPime (MAXIPIME) IV 2 g (04/22/19 1307)  . heparin 2,600 Units/hr (04/22/19 0929)  . metronidazole 500 mg (04/22/19 1022)  LOS: 11 days     Georgette Shell, MD  04/22/2019, 3:02 PM

## 2019-04-22 NOTE — Progress Notes (Signed)
Timothy [name]   DOB:14-Nov-1980   [id number]    Assessment & Plan:   New onset leukocytosis, fever -Etiology unclear; ddx includes infection (such as intra-abdominal abscess), DVT and recurrent testicular cancer -WBC 15.8k today, new; patient continues to have intermittent fever (TMax 101)  -I independently reviewed the radiologic images of CT abdomen/pelvis from 04/18/2019, which demonstrated an ill-defined area of low attenuation in the left psoas muscle, suggesting possible abscess vs hematoma -Given that the patient continues to have intermittent fever without a clear source, I recommend repeating CT abdomen/pelvis (without contrast due to rising creatinine) to assess any interval change in the left psoas muscle  -If there is any evidence of infection, such as gas or stranding, it may be beneficial to consult IR for abscess drain placement to rule out infection, and to better tailor antibiotic therapy -In addition, I recommend repeating blood cultures to rule out any bacteremia -As cisplatin/etoposide is very immunosuppressive and associated with significantly increased risk of infection, it would be prudent to rule out any new infection before starting chemotherapy -Until infection is ruled out, I will hold off on PICC line due to the risk of line infection  -Once the infection work-up is complete, the patient's primary oncologist, Dr. Burr Medico, can re-assess the timing of chemotherapy   Left lower extremity DVT -S/p thrombolysis and IVC stent placement -Currently on heparin gtt -Patient denies any symptoms of bleeding -Continue anticoagulation per the hospitalist team   AKI -Cr 1.46 today, higher than yesterday -I encouraged the patient to maintain adequate hydration -CT abdomen/pelvis as above to assess for any obstruction  -Avoid nephrotoxic medications -Chemotherapy on hold, as cisplatin is nephrotoxic, until renal function improves  Recurrent testicular cancer -Followed by Dr.  Burr Medico -As discussed above, prior to starting chemotherapy, it would be very important to rule out any occult infection, as cisplatin/etoposide is very immunosuppressive and associated with significantly increased risk of infection -Furthermore, given the rising creatinine and the potential nephrotoxicity of cisplatin, the renal function needs to improve (ideally normalize) before starting chemotherapy -I will defer the timing of starting chemotherapy to the patient's primary oncologist, Dr. Burr Medico, when she returns on Monday, 04/24/2019   Thank you for the opportunity to participate in Timothy [name] care. Please do not hesitate to contact Timothy [name] if there are any questions.  Tish Men, MD 04/22/2019  10:18 AM   Subjective:  Mr. Timothy [name] reports that he feels a little better this morning, and is able to eat jello without any worsening abdominal pain.  His abdominal distention is still moderate, and fluctuates throughout the day.  He has intermittent fever (Tmax 101 over past 24 hours), but he denies any chest pain, dyspnea, cough, diarrhea, or dysuria.    ROS: Constitutional: ( + ) fevers, ( - )  chills , ( - ) night sweats Ears, nose, mouth, throat, and face: ( - ) mucositis, ( - ) sore throat Respiratory: ( - ) cough, ( - ) dyspnea, ( - ) wheezes Cardiovascular: ( - ) palpitation, ( - ) chest discomfort, ( - ) lower extremity swelling Gastrointestinal:  ( - ) nausea, ( - ) heartburn, ( + ) change in bowel habits Skin: ( + ) abnormal skin rashes Behavioral/Psych: ( - ) mood change, ( - ) new changes  All other systems were reviewed with the patient and are negative.  Objective:  Vitals:   04/22/19 0217 04/22/19 0625  BP: 126/78 133/78  Pulse: (!) 110 (!) 113  Resp: 18 18  Temp: 98.7 F (37.1 C) (!) 101 F (38.3 C)  SpO2: 92% 98%     Intake/Output Summary (Last 24 hours) at 04/22/2019 1018 Last data filed at 04/22/2019 0515 Gross per 24 hour  Intake 5357.46 ml  Output 2100 ml  Net 3257.46 ml     GENERAL: alert, no distress and comfortable lying in bed SKIN: a few areas of small bruises over the bilateral arms  EYES: conjunctiva are pink and non-injected, sclera clear LUNGS: clear to auscultation with normal breathing effort HEART: regular rate & rhythm and no murmurs and 2+ bilateral lower extremity edema ABDOMEN: no significant tenderness with palpation, moderate distention, slightly decreased bowel sounds PSYCH: alert & oriented x 3, fluent speech   Labs:  Lab Results  Component Value Date   WBC 15.8 (H) 04/22/2019   HGB 7.0 (L) 04/22/2019   HCT 21.2 (L) 04/22/2019   MCV 89.8 04/22/2019   PLT 488 (H) 04/22/2019   NEUTROABS 6.3 04/20/2019    Lab Results  Component Value Date   NA 138 04/22/2019   K 3.0 (L) 04/22/2019   CL 107 04/22/2019   CO2 21 (L) 04/22/2019    Studies:  DG Abd 1 View  Result Date: 04/21/2019 CLINICAL DATA:  Ileus, history of IVC stent placement EXAM: ABDOMEN - 1 VIEW COMPARISON:  04/20/2019 FINDINGS: Supine frontal view of the abdomen and pelvis excludes the left flank and lower pelvis by collimation. There is progressive gaseous distension of large and small bowel. IVC stent unchanged. No masses or abnormal calcifications. IMPRESSION: Progressive gaseous distension of large and small bowel consistent with ileus. Electronically Signed   By: Randa Ngo M.D.   On: 04/21/2019 08:42   DG Abd 1 View  Result Date: 04/20/2019 CLINICAL DATA:  Diffuse abdominal tenderness, abdominal distension EXAM: ABDOMEN - 1 VIEW COMPARISON:  04/19/2019 FINDINGS: 2 supine frontal views of the abdomen and pelvis demonstrate persistent diffuse gaseous distention of the colon likely postoperative ileus. No masses or abnormal calcifications. Venous stent again noted. IMPRESSION: 1. Continued postoperative ileus. Electronically Signed   By: Randa Ngo M.D.   On: 04/20/2019 15:10   Timothy [name] EKG SITE RITE  Result Date: 04/21/2019 If Site Rite image not attached, placement  could not be confirmed due to current cardiac rhythm.

## 2019-04-22 NOTE — Plan of Care (Signed)
  Problem: Education: Goal: Knowledge of General Education information will improve Description: Including pain rating scale, medication(s)/side effects and non-pharmacologic comfort measures Outcome: Progressing   Problem: Health Behavior/Discharge Planning: Goal: Ability to manage health-related needs will improve Outcome: Progressing   Problem: Clinical Measurements: Goal: Ability to maintain clinical measurements within normal limits will improve Outcome: Progressing Goal: Will remain free from infection Outcome: Progressing Goal: Respiratory complications will improve Outcome: Progressing Goal: Cardiovascular complication will be avoided Outcome: Progressing   Problem: Activity: Goal: Risk for activity intolerance will decrease Outcome: Progressing   Problem: Nutrition: Goal: Adequate nutrition will be maintained Outcome: Progressing   Problem: Elimination: Goal: Will not experience complications related to bowel motility Outcome: Progressing Goal: Will not experience complications related to urinary retention Outcome: Progressing   Problem: Pain Managment: Goal: General experience of comfort will improve Outcome: Progressing   Problem: Safety: Goal: Ability to remain free from injury will improve Outcome: Progressing   Problem: Skin Integrity: Goal: Risk for impaired skin integrity will decrease Outcome: Progressing   

## 2019-04-22 NOTE — Progress Notes (Addendum)
Initial Nutrition Assessment  DOCUMENTATION CODES:   Not applicable  INTERVENTION:   Patient day 6 without adequate nutrition. May need to consider Cortrak on Tuesday is intake does not progress.    Boost Breeze po TID, each supplement provides 250 kcal and 9 grams of protein  30 ml Prostat BID, each supplement provides 100 kcals and 15 grams protein.   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 Presents this admission with L lower extremity DVT with intra-abdominal and retroperitoneal lymphadenopathy concerning for metastatic cancer.   Pt is s/p retroperitoneal LN biopsy. Pt to start chemotherapy?   Concern for ileus. Now having BMs. Diet advanced to clear liquids. Noted 20% of breakfast completed this am. Attempted to call pt in room, no answer. Unsure of appetite PTA. Will need to obtain nutrition/weight history of possible. RD to provide supplementation to maximize kcal and protein this admission.   Weight history limited. Will utilize 98.4 as EDW.   I/O: +9,992 ml since admit  UOP: 3,000 ml x 24 hrs   Drips: NS @ 100 ml/hr  Medications: miralax Labs: K 3.0 (L) Mg 2.5 (L) Cr 1.46-trending up   Diet Order:   Diet Order            Diet clear liquid Room service appropriate? Yes; Fluid consistency: Thin  Diet effective now              EDUCATION NEEDS:   Not appropriate for education at this time  Skin:  Skin Assessment: Skin Integrity Issues: Skin Integrity Issues:: Incisions Incisions: R/L  knee  Last BM:  2/5  Height:   Ht Readings from Last 1 Encounters:  04/10/19 6\' 3"  (1.905 m)    Weight:   Wt Readings from Last 1 Encounters:  04/18/19 98.4 kg    Ideal Body Weight:  89.1 kg  BMI:  Body mass index is 27.11 kg/m.  Estimated Nutritional Needs:   Kcal:  2450-2650 kcal  Protein:  125-145 grams  Fluid:  >/= 2.4 L/day   Mariana Single RD, LDN Clinical Nutrition Pager listed in [place]

## 2019-04-22 NOTE — Progress Notes (Signed)
   04/22/19 1542  Vitals  Temp (!) 102.2 F (39 C)  Temp Source Oral  BP 133/84  MAP (mmHg) 99  BP Location Left Arm  BP Method Automatic  Patient Position (if appropriate) Lying  Pulse Rate (!) 118  Oxygen Therapy  SpO2 97 %  O2 Device Room Air  MEWS Score  MEWS Temp 2  MEWS Systolic 0  MEWS Pulse 2  MEWS RR 0  MEWS LOC 0  MEWS Score 4  MEWS Score Color Red   Jacki Cones, MD paged at [phone] and returned call at [phone]. Per Zigmund Daniel, MD pt to remain on yellow MEWS. No new orders at this time. RN encouraging pt to continue to use incentive spirometer. Will continue to monitor.

## 2019-04-22 NOTE — Progress Notes (Signed)
ANTICOAGULATION CONSULT NOTE Pharmacy Consult for Heparin Indication: DVT  No Known Allergies  Patient Measurements: Height: 6\' 3"  (190.5 cm) Weight: 216 lb 14.9 oz (98.4 kg) IBW/kg (Calculated) : 84.5  Vital Signs: Temp: 101 F (38.3 C) (02/06 0625) Temp Source: Oral (02/06 0625) BP: 133/78 (02/06 0625) Pulse Rate: 113 (02/06 0625)  Labs: Recent Labs    04/20/19 0231 04/20/19 0231 04/21/19 0409 04/22/19 0500  HGB 7.3*   < > 7.0* 7.0*  HCT 22.6*  --  21.1* 21.2*  PLT 398  --  453* 488*  HEPARINUNFRC 0.60  --  0.62 0.56  CREATININE 1.01  --  1.33* 1.46*  CKTOTAL 624*  --   --   --    < > = values in this interval not displayed.    Estimated Creatinine Clearance: 83.6 mL/min (A) (by C-G formula based on SCr of 1.46 mg/dL (H)).   Assessment: Timothy [name] is a 39 y.o. male who has hx of testicular CA s/p right orchiectomy 2012, developed LLE edema after traveling and found to have extensive DVT. Underwent mechanical and pharmacological thrombectomy 1/30 and 1/31 and also had IVC stent placed. Pharmacy has been consulted to dose heparin infusion. Plan to continue heparin for now then transition to enoxaparin for 30 days.  Today, patient's heparin level remains therapeutic at 0.56. Hgb low but stable at 7.0 from previous day (patient has declined blood transfusion recently). Platelet count stable. No issues with the infusion or bleeding reported by RN. Will continue to monitor.  Goal of Therapy: Heparin level 0.3-0.7 units/ml Monitor platelets by anticoagulation protocol: Yes  Plan: Continue heparin infusion at 2600 units/hr Daily Heparin level/CBC while on heparin Monitor for signs/symptoms of bleeding Follow up transition to enoxaparin closer to discharge/transfer   Brendolyn Patty, PharmD PGY2 Pharmacy Resident Phone [phone]  04/22/2019   8:03 AM

## 2019-04-23 DIAGNOSIS — K567 Ileus, unspecified: Secondary | ICD-10-CM

## 2019-04-23 LAB — CBC
HCT: 20.3 % — ABNORMAL LOW (ref 39.0–52.0)
HCT: 21.4 % — ABNORMAL LOW (ref 39.0–52.0)
Hemoglobin: 6.7 g/dL — CL (ref 13.0–17.0)
Hemoglobin: 7 g/dL — ABNORMAL LOW (ref 13.0–17.0)
MCH: 29.4 pg (ref 26.0–34.0)
MCH: 29.5 pg (ref 26.0–34.0)
MCHC: 33 g/dL (ref 30.0–36.0)
MCHC: 32.7 g/dL (ref 30.0–36.0)
MCV: 89 fL (ref 80.0–100.0)
MCV: 90.3 fL (ref 80.0–100.0)
Platelets: 454 10*3/uL — ABNORMAL HIGH (ref 150–400)
Platelets: 446 10*3/uL — ABNORMAL HIGH (ref 150–400)
RBC: 2.28 MIL/uL — ABNORMAL LOW (ref 4.22–5.81)
RBC: 2.37 MIL/uL — ABNORMAL LOW (ref 4.22–5.81)
RDW: 14.1 % (ref 11.5–15.5)
RDW: 14.4 % (ref 11.5–15.5)
WBC: 15.5 10*3/uL — ABNORMAL HIGH (ref 4.0–10.5)
WBC: 16.5 10*3/uL — ABNORMAL HIGH (ref 4.0–10.5)
nRBC: 0.3 % — ABNORMAL HIGH (ref 0.0–0.2)
nRBC: 0.3 % — ABNORMAL HIGH (ref 0.0–0.2)

## 2019-04-23 LAB — COMPREHENSIVE METABOLIC PANEL
ALT: 27 U/L (ref 0–44)
AST: 32 U/L (ref 15–41)
Albumin: 2.3 g/dL — ABNORMAL LOW (ref 3.5–5.0)
Alkaline Phosphatase: 67 U/L (ref 38–126)
Anion gap: 10 (ref 5–15)
BUN: 13 mg/dL (ref 6–20)
CO2: 19 mmol/L — ABNORMAL LOW (ref 22–32)
Calcium: 7.5 mg/dL — ABNORMAL LOW (ref 8.9–10.3)
Chloride: 106 mmol/L (ref 98–111)
Creatinine, Ser: 1.32 mg/dL — ABNORMAL HIGH (ref 0.61–1.24)
GFR calc Af Amer: >60 mL/min (ref >60)
GFR calc non Af Amer: >60 mL/min (ref >60)
Glucose, Bld: 113 mg/dL — ABNORMAL HIGH (ref 70–99)
Potassium: 3.3 mmol/L — ABNORMAL LOW (ref 3.5–5.1)
Sodium: 135 mmol/L (ref 135–145)
Total Bilirubin: 0.8 mg/dL (ref 0.3–1.2)
Total Protein: 6.1 g/dL — ABNORMAL LOW (ref 6.5–8.1)

## 2019-04-23 LAB — URIC ACID: Uric Acid, Serum: 3.4 mg/dL — ABNORMAL LOW (ref 3.7–8.6)

## 2019-04-23 LAB — ABO/RH: ABO/RH(D): O POS

## 2019-04-23 LAB — HEPARIN LEVEL (UNFRACTIONATED): Heparin Unfractionated: 0.38 IU/mL (ref 0.30–0.70)

## 2019-04-23 LAB — PREPARE RBC (CROSSMATCH)

## 2019-04-23 MED ORDER — POTASSIUM CHLORIDE CRYS ER 20 MEQ PO TBCR
40.0000 meq | EXTENDED_RELEASE_TABLET | Freq: Once | ORAL | Status: AC
  Administered 2019-04-23: 40 meq via ORAL
  Filled 2019-04-23: qty 2

## 2019-04-23 MED ORDER — SODIUM CHLORIDE 0.9% IV SOLUTION: Freq: Once | INTRAVENOUS | Status: AC

## 2019-04-23 MED ORDER — SODIUM CHLORIDE 0.9% IV SOLUTION: Freq: Once | INTRAVENOUS | Status: DC

## 2019-04-23 NOTE — Progress Notes (Signed)
ANTICOAGULATION CONSULT NOTE Pharmacy Consult for Heparin Indication: DVT  No Known Allergies  Patient Measurements: Height: 6\' 3"  (190.5 cm) Weight: 216 lb 14.9 oz (98.4 kg) IBW/kg (Calculated) : 84.5  Vital Signs: Temp: 100 F (37.8 C) (02/07 0521) Temp Source: Oral (02/07 0521) BP: 145/90 (02/07 0521) Pulse Rate: 112 (02/07 0521)  Labs: Recent Labs    04/21/19 0409 04/21/19 0409 04/22/19 0500 04/23/19 0340  HGB 7.0*   < > 7.0* 6.7*  HCT 21.1*  --  21.2* 20.3*  PLT 453*  --  488* 454*  HEPARINUNFRC 0.62  --  0.56 0.38  CREATININE 1.33*  --  1.46*  --    < > = values in this interval not displayed.    Estimated Creatinine Clearance: 83.6 mL/min (A) (by C-G formula based on SCr of 1.46 mg/dL (H)).   Assessment: Timothy [name] is a 39 y.o. male who has hx of testicular CA s/p right orchiectomy 2012, developed LLE edema after traveling and found to have extensive DVT. Underwent mechanical and pharmacological thrombectomy 1/30 and 1/31 and also had IVC stent placed. Pharmacy has been consulted to dose heparin infusion. Plan to continue heparin for now then transition to enoxaparin for 30 days.  Today, patient's heparin level remains therapeutic at 0.38. Hgb decreased from previous day, now at 6.7 and patient has declined blood transfusion. Platelet count stable. Abdominal CT yesterday showing retroperitoneal hematomas. No overt bleeding noted and no issues with the infusion overnight reported. Will continue to monitor.  Goal of Therapy: Heparin level 0.3-0.7 units/ml Monitor platelets by anticoagulation protocol: Yes  Plan: Continue heparin infusion at 2600 units/hr Daily Heparin level/CBC while on heparin Monitor for signs/symptoms of bleeding Follow up transition to enoxaparin closer to discharge/transfer   Brendolyn Patty, PharmD PGY2 Pharmacy Resident Phone [phone]  04/23/2019   7:26 AM

## 2019-04-23 NOTE — Consult Note (Signed)
I have been asked to see the patient by Dr. Jacki Cones, for evaluation and management of right hydro/AKI.  History of present illness: Unfortunate 39 year old male with a past history of localized testicular cancer who was treated primarily with orchiectomy and active surveillance.  He is from [place], and was treated at [facility] for this.  He is in town visiting his mother, and developed some back pain followed by lower extremity swelling and edema.  He was found to have a large DVT and subsequent evaluation demonstrated recurrence of his testicular cancer with bulky retroperitoneal lymphadenopathy.  His hospitalization has been complicated by bleeding resulting from the thrombectomy and biopsy.  He currently has a ileus which has limited a lot of his progress.  A CT scan performed yesterday demonstrated some mild right hydroureteronephrosis and his kidney function over the course of the last several days has worsened.  The patient has had several CT scans as well as IR procedures that have required IV contrast.  The patient has no history of kidney dysfunction.  He is not complaining of any flank pain.  He has been urinating without difficulty.  He has had some hematuria over the past several days, but this seems to be clearing, according to the patient.  He has had some low-grade fevers that have been ongoing, the most recent CT scan did not demonstrate abscess, he has no real other evidence of infection.  He has been treated with broad-spectrum antibiotics.  Review of systems: A 12 point comprehensive review of systems was obtained and is negative unless otherwise stated in the history of present illness.  Patient Active Problem List   Diagnosis Date Noted  . Ileus ([place])   . Fever   . Abscess after procedure   . Abdominal distension   . Acute deep vein thrombosis (DVT) of left lower extremity ([place])   . Elevated serum creatinine   . 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 DVT (deep venous  thrombosis) ([place]) 04/11/2019  . H/O testicular cancer 04/11/2019  . Lymphadenopathy, retroperitoneal     No current facility-administered medications on file prior to encounter.   Current Outpatient Medications on File Prior to Encounter  Medication Sig Dispense Refill  . cyclobenzaprine (FLEXERIL) 10 MG tablet Take 1 tablet (10 mg total) by mouth 3 (three) times daily as needed for muscle spasms. 30 tablet 0  . ibuprofen (ADVIL) 600 MG tablet Take 1 tablet (600 mg total) by mouth every 6 (six) hours as needed. 30 tablet 0    Past Medical History:  Diagnosis Date  . Testicular cancer ([place])    stage Ib seminoma testicular cancer     Past Surgical History:  Procedure Laterality Date  . IR INFUSION THROMBOL VENOUS INITIAL (MS)  04/15/2019  . IR THROMB F/U EVAL ART/VEN FINAL DAY (MS)  04/16/2019  . IR THROMBECT VENO MECH MOD SED  04/15/2019  . IR THROMBECT VENO MECH REPT MOD SED  04/16/2019  . IR TRANSCATH PLC STENT 1ST ART NOT LE CV CAR VERT CAR  04/16/2019  . IR US GUIDE VASC ACCESS LEFT  04/15/2019  . IR US GUIDE VASC ACCESS RIGHT  04/15/2019  . IR VENO/EXT/BI  04/15/2019  . IR VENOCAVAGRAM IVC  04/15/2019  . ORCHIECTOMY Right   . TUMOR REMOVAL      Social History   Tobacco Use  . Smoking status: Never Smoker  . Smokeless tobacco: Never Used  Substance Use Topics  . Alcohol use: Not Currently  . Drug  use: Not on file    Family History  Problem Relation Age of Onset  . Atrial fibrillation Mother     PE: Vitals:   04/22/19 1817 04/22/19 2128 04/23/19 0150 04/23/19 0521  BP: (!) 144/83 (!) 140/97 (!) 141/88 (!) 145/90  Pulse: (!) 111 (!) 114 (!) 115 (!) 112  Resp: 19 18 18 18   Temp: 100.2 F (37.9 C) 98.7 F (37.1 C) 99.5 F (37.5 C) 100 F (37.8 C)  TempSrc: Oral Oral Oral Oral  SpO2: 96% 100% 98% 96%  Weight:      Height:       Patient appears to be in no acute distress  patient is alert and oriented x3 Atraumatic normocephalic head No cervical or  supraclavicular lymphadenopathy appreciated No increased work of breathing, no audible wheezes/rhonchi Regular sinus rhythm/rate Abdomen is significantly distended, tympanitic, no CVA tenderness Lower extremities are symmetric with mild edema Grossly neurologically intact No identifiable skin lesions  Recent Labs    04/21/19 0409 04/22/19 0500 04/23/19 0340  WBC 10.1 15.8* 15.5*  HGB 7.0* 7.0* 6.7*  HCT 21.1* 21.2* 20.3*   Recent Labs    04/21/19 0409 04/22/19 0500  NA 137 138  K 3.5 3.0*  CL 103 107  CO2 20* 21*  GLUCOSE 107* 108*  BUN 11 13  CREATININE 1.33* 1.46*  CALCIUM 7.7* 7.5*   No results for input(s): LABPT, INR in the last 72 hours. No results for input(s): LABURIN in the last 72 hours. Results for orders placed or performed during the hospital encounter of 04/10/19  SARS CORONAVIRUS 2 (TAT 6-24 HRS) Nasopharyngeal Nasopharyngeal Swab     Status: None   Collection Time: 04/11/19 12:32 AM   Specimen: Nasopharyngeal Swab  Result Value Ref Range Status   SARS Coronavirus 2 NEGATIVE NEGATIVE Final    Comment: (NOTE) SARS-CoV-2 target nucleic acids are NOT DETECTED. The SARS-CoV-2 RNA is generally detectable in upper and lower respiratory specimens during the acute phase of infection. Negative results do not preclude SARS-CoV-2 infection, do not rule out co-infections with other pathogens, and should not be used as the sole basis for treatment or other patient management decisions. Negative results must be combined with clinical observations, patient history, and epidemiological information. The expected result is Negative. Fact Sheet for Patients: SugarRoll.be Fact Sheet for Healthcare Providers: https://www.woods-mathews.com/ This test is not yet approved or cleared by the [place] FDA and  has been authorized for detection and/or diagnosis of SARS-CoV-2 by FDA under an Emergency Use Authorization (EUA). This  EUA will remain  in effect (meaning this test can be used) for the duration of the COVID-19 declaration under Section 56 4(b)(1) of the Act, 21 U.S.C. section 360bbb-3(b)(1), unless the authorization is terminated or revoked sooner. Performed at [facility], [place] [address] [place], [place] [address]   Culture, blood (routine x 2)     Status: None   Collection Time: 04/17/19 10:15 AM   Specimen: BLOOD RIGHT HAND  Result Value Ref Range Status   Specimen Description BLOOD RIGHT HAND  Final   Special Requests   Final    BOTTLES DRAWN AEROBIC ONLY Blood Culture results may not be optimal due to an inadequate volume of blood received in culture bottles   Culture   Final    NO GROWTH 5 DAYS Performed at [facility], [place] [address], [place], [place] [address]    Report Status 04/22/2019 FINAL  Final  Culture, blood (routine x 2)  Status: None   Collection Time: 04/17/19 10:20 AM   Specimen: BLOOD  Result Value Ref Range Status   Specimen Description BLOOD LEFT ANTECUBITAL  Final   Special Requests   Final    BOTTLES DRAWN AEROBIC AND ANAEROBIC Blood Culture adequate volume   Culture   Final    NO GROWTH 5 DAYS Performed at [facility], [address] [place], [place] [address]    Report Status 04/22/2019 FINAL  Final  Culture, Urine     Status: None   Collection Time: 04/17/19 10:43 AM   Specimen: Urine, Random  Result Value Ref Range Status   Specimen Description URINE, RANDOM  Final   Special Requests NONE  Final   Culture   Final    NO GROWTH Performed at [facility], [place] [address] [place], [place] [address]    Report Status 04/18/2019 FINAL  Final  Culture, blood (routine x 2)     Status: None (Preliminary result)   Collection Time: 04/22/19 11:58 AM   Specimen: BLOOD LEFT WRIST  Result Value Ref Range Status   Specimen Description BLOOD LEFT WRIST  Final   Special Requests   Final    AEROBIC BOTTLE ONLY Blood Culture adequate  volume Performed at [facility], [place] [address] [place], [place] [address]    Culture NO GROWTH < 24 HOURS  Final   Report Status PENDING  Incomplete  Culture, blood (routine x 2)     Status: None (Preliminary result)   Collection Time: 04/22/19 11:59 AM   Specimen: BLOOD LEFT HAND  Result Value Ref Range Status   Specimen Description BLOOD LEFT HAND  Final   Special Requests   Final    AEROBIC BOTTLE ONLY Blood Culture adequate volume Performed at [facility], [place] [address] [place], [place] [address]    Culture NO GROWTH < 24 HOURS  Final   Report Status PENDING  Incomplete    Imaging: I reviewed the patient's CT scan from yesterday, and the imaging obtained in the ED and the subsequent CT scans thereafter.  This demonstrated some mild progression of some right upper tract dilatation, there appears to be maybe even a blood clot within the right ureter.  The there is no clear etiology for any extrinsic compression, the majority of the patient's disease is in the left retroperitoneum.  There is no hematomas that may be obstructing this area.  Imp: The patient has advanced/recurrent testicular cancer with associated retroperitoneal lymphadenopathy.  He has developed acute kidney injury during his hospitalization which is multifactorial, but I do not think that the mildly obstructed right kidney is a significant contributor to his acute kidney injury, rather I think this is mostly associated with contrast nephropathy and his anemia.  Recommendations: Do not recommend any urologic intervention at this time.  I did discuss the possibility of in the future the patient requiring a stent if things did not improve or rather worsen.  However, with time things will improve.  I do not feel strongly about him being continued on antibiotics, I would recommend that he be hep-locked to prevent any ongoing bowel wall edema which worsens ileus.  I would monitor his labs relatively closely to  ensure that he does not have any ongoing hemoglobin drop, and transfuse if it continues to drop.  However, currently he seems to be tolerating a low hemoglobin relatively well.    We will keep him on our list, but  see PRN.  Please contact us for further questions.   Ardis Hughs

## 2019-04-23 NOTE — Progress Notes (Signed)
Report called and given to Hinton Dyer, Therapist, sports at [facility]. Hinton Dyer, RN made aware pt will need 1 unit of blood. All questions answered to satisfaction. [facility] aware of pt transfer. Facesheet and Medical Necessity provided to [facility]. Report called and given to [place] with [facility]. [facility] transported pt to [facility].

## 2019-04-23 NOTE — Progress Notes (Signed)
0440 X. Blount MD notified of Hgb 6.7 and patient refusing blood transfusion.

## 2019-04-23 NOTE — Progress Notes (Signed)
PROGRESS NOTE  Timothy [name] [id number] DOB: Jul 29, 1980 DOA: 04/10/2019 PCP: Patient, No Pcp Per  Brief Summary:   history of testicular carcinoma stage Ib followed at [facility] had right orchiectomy lives in [place] travel to [place] to visit his mother admitted to [facility] long due to acute LLE DVT and found to have recurrent cancer.  He was transferred to [facility] for mechanical thrombolysis of LLE DVT, hospital course complicated by retroperitoneal hematoma, psoas hematoma, anemia, ileus,  AKI, New mild right hydronephrosis presumably from external compression,  fever of unknown origin on empirical abx due to underline immunosuppressed status  He is to transfer back to [place] per oncology request, plan to start chemo    HPI/Recap of past 24 hours:  Sitting up in chair,  reports some  left thigh pain,  Reports ab remains distended but improved, no n/v, tolerating full liquid diet, having loose bm,  No hematuria seen today in foley bag tmax 100.3 , persistent sinus tachycardia, bp stable, no hypoxia  Assessment/Plan: Principal Problem:   DVT (deep venous thrombosis) ([place]) Active Problems:   H/O testicular cancer   Lymphadenopathy, retroperitoneal   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 -Currently on heparin gtt , can transition to lovenox if no further procedures planned -Long term anticoagulation plan: Lovenox to be continued for 30 days once discharged then transition to oral anticoagulation 90 days of aspirin 81 mg daily -Elevate leg, compression  stocking  Per Dr. Stephenie Acres. Lavon Paganini, Assistant Professor of Radiology, has kindly agreed to see Timothy [name] in continuity of his current vascular care, when Timothy [name] returns home.  Scheduling: [place]: [phone]  Dr. Ottis Stain has also kindly arranged follow-up with the Medical Oncology Department of [facility], and Timothy [name] should have a pending office appointment upon arrival.   Fever of unknown origin/leukocytosis -was on vanc ,stopped on 2/4, currently on empiric abx cefepime/flagyl, so far cultures are unrevealing -Echo normal ejection fraction, no evidence of valvular infection -He was Covid negative on admission. 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 -oncology Dr Burr Medico plan to start chemo, transfer back to [place] -plan per oncology  DVT Prophylaxis: Heparin   Code Status: Full  Family Communication: patient   Disposition Plan: Transfer back to [facility] for possible chemo   Consultants:  IR  Oncology  Urology  Critical care  Procedures:  Mechanical thrombolysis and IVC placement  Antibiotics:  As above   Objective: BP 138/79 (BP Location: Left Arm)   Pulse (!) 113   Temp 99 F (37.2 C) (Oral)   Resp 20   Ht 6\' 3"  (1.905 m)   Wt 98.4 kg  SpO2 97%   BMI 27.11 kg/m   Intake/Output Summary (Last 24 hours) at 04/23/2019 1408 Last data filed at 04/23/2019 0151 Gross per 24 hour  Intake --  Output 2100 ml  Net -2100 ml   Filed  Weights   04/10/19 2005 04/18/19 0600  Weight: 90.7 kg 98.4 kg    Exam: Patient is examined daily including today on 04/23/2019, exams remain the same as of yesterday except that has changed    General:  NAD  Cardiovascular: tachycardia  Respiratory: diminished at basis, no wheezing, no rales, no rhonchi  Abdomen: distended,  positive BS but sluggish   Musculoskeletal: bilateral lower extremity pitting Edema left > right   Neuro: alert, oriented   Data Reviewed: Basic Metabolic Panel: Recent Labs  Lab 04/19/19 0151 04/20/19 0231 04/20/19 1038 04/21/19 0409 04/22/19 0500 04/23/19 0930  NA 134* 136  --  137 138 135  K 3.9 3.4*  --  3.5 3.0* 3.3*  CL 102 106  --  103 107 106  CO2 22 19*  --  20* 21* 19*  GLUCOSE 107* 116*  --  107* 108* 113*  BUN 6 8  --  11 13 13   CREATININE 0.95 1.01  --  1.33* 1.46* 1.32*  CALCIUM 7.6* 7.6*  --  7.7* 7.5* 7.5*  MG  --   --  2.5*  --   --   --    Liver Function Tests: Recent Labs  Lab 04/19/19 0151 04/20/19 0231 04/21/19 0409 04/22/19 0500 04/23/19 0930  AST 30 33 35 37 32  ALT 24 26 28 28 27   ALKPHOS 84 78 69 60 67  BILITOT 1.0 1.2 1.1 0.7 0.8  PROT 6.1* 6.1* 5.9* 5.9* 6.1*  ALBUMIN 2.3* 2.3* 2.3* 2.2* 2.3*   No results for input(s): LIPASE, AMYLASE in the last 168 hours. No results for input(s): AMMONIA in the last 168 hours. CBC: Recent Labs  Lab 04/17/19 0018 04/18/19 1051 04/19/19 0151 04/20/19 0231 04/21/19 0409 04/22/19 0500 04/23/19 0340  WBC 11.4*   < > 13.2* 8.4 10.1 15.8* 15.5*  NEUTROABS 9.0*  --   --  6.3  --   --   --   HGB 11.0*   < > 8.5* 7.3* 7.0* 7.0* 6.7*  HCT 32.8*   < > 26.0* 22.6* 21.1* 21.2* 20.3*  MCV 88.9   < > 91.9 91.5 89.8 89.8 89.0  PLT 215   < > 337 398 453* 488* 454*   < > = values in this interval not displayed.   Cardiac Enzymes:   Recent Labs  Lab 04/20/19 0231  CKTOTAL 624*   BNP (last 3 results) No results for input(s): BNP in the last 8760 hours.  ProBNP (last 3  results) No results for input(s): PROBNP in the last 8760 hours.  CBG: No results for input(s): GLUCAP in the last 168 hours.  Recent Results (from the past 240 hour(s))  Culture, blood (routine x 2)     Status: None   Collection Time: 04/17/19 10:15 AM   Specimen: BLOOD RIGHT HAND  Result Value Ref Range Status   Specimen Description BLOOD RIGHT HAND  Final   Special Requests   Final    BOTTLES DRAWN AEROBIC ONLY Blood Culture results may not be optimal due to an inadequate volume of blood received in culture bottles   Culture   Final    NO GROWTH 5 DAYS Performed at [facility], [place] [address] [place], [place]  [id number]    Report Status 04/22/2019 FINAL  Final  Culture, blood (routine x 2)     Status: None   Collection Time: 04/17/19 10:20 AM   Specimen: BLOOD  Result Value Ref Range Status   Specimen Description BLOOD LEFT ANTECUBITAL  Final   Special Requests   Final    BOTTLES DRAWN AEROBIC AND ANAEROBIC Blood Culture adequate volume   Culture   Final    NO GROWTH 5 DAYS Performed at [facility], [place] [address], [place], [place] [address]    Report Status 04/22/2019 FINAL  Final  Culture, Urine     Status: None   Collection Time: 04/17/19 10:43 AM   Specimen: Urine, Random  Result Value Ref Range Status   Specimen Description URINE, RANDOM  Final   Special Requests NONE  Final   Culture   Final    NO GROWTH Performed at [facility], [place] [address] [place], [place] [address]    Report Status 04/18/2019 FINAL  Final  Culture, blood (routine x 2)     Status: None (Preliminary result)   Collection Time: 04/22/19 11:58 AM   Specimen: BLOOD LEFT WRIST  Result Value Ref Range Status   Specimen Description BLOOD LEFT WRIST  Final   Special Requests   Final    AEROBIC BOTTLE ONLY Blood Culture adequate volume Performed at [facility], [place] [address] [place], [place] [address]    Culture NO GROWTH < 24 HOURS  Final   Report Status PENDING   Incomplete  Culture, blood (routine x 2)     Status: None (Preliminary result)   Collection Time: 04/22/19 11:59 AM   Specimen: BLOOD LEFT HAND  Result Value Ref Range Status   Specimen Description BLOOD LEFT HAND  Final   Special Requests   Final    AEROBIC BOTTLE ONLY Blood Culture adequate volume Performed at [facility], [place] [address], [place], [place] [address]    Culture NO GROWTH < 24 HOURS  Final   Report Status PENDING  Incomplete     Studies: US RENAL  Result Date: 04/22/2019 CLINICAL DATA:  Elevated serum creatinine level. EXAM: RENAL / URINARY TRACT ULTRASOUND COMPLETE COMPARISON:  April 17, 2019. FINDINGS: Right Kidney: Renal measurements: 13.9 x 6.4 x 6.3 cm = volume: 294 mL . Echogenicity within normal limits. No mass or hydronephrosis visualized. Left Kidney: Renal measurements: 12.2 x 6.3 x 6.2 cm = volume: 246 mL. Echogenicity within normal limits. No mass or hydronephrosis visualized. Bladder: Appears normal for degree of bladder distention. Foley catheter is noted. Other: None. IMPRESSION: No significant renal abnormality is noted. Electronically Signed   By: Marijo Conception M.D.   On: 04/22/2019 16:53    Scheduled Meds: . sodium chloride   Intravenous Once  . Chlorhexidine Gluconate Cloth  6 each Topical Daily  . feeding supplement  1 Container Oral TID BM  . feeding supplement (PRO-STAT SUGAR FREE 64)  30 mL Oral BID  . multivitamin with minerals  1 tablet Oral Daily  . pantoprazole (PROTONIX) IV  40 mg Intravenous Q12H  . polyethylene glycol  17 g Oral Daily    Continuous Infusions: . ceFEPime (MAXIPIME) IV 2 g (04/23/19 0524)  . heparin 2,600 Units/hr (04/23/19 0525)  . metronidazole 500 mg (04/23/19 1028)     Time spent: 35 mins, case discussed with oncology Dr Burr Medico I have personally reviewed and interpreted on  04/23/2019 daily labs, tele strips,  imagings as discussed above under date review session and assessment and plans.  I reviewed all  nursing notes, pharmacy notes, consultant notes,  vitals, pertinent old records  I have discussed plan of care as described above with RN , patient on 04/23/2019   Florencia Reasons MD, PhD, FACP  [facility]  Available via Epic secure chat 7am-7pm for nonurgent issues Please page for urgent issues, pager number available through [email or web address] .   04/23/2019, 2:08 PM  LOS: 12 days

## 2019-04-23 NOTE — Plan of Care (Signed)
  Problem: Education: Goal: Knowledge of General Education information will improve Description: Including pain rating scale, medication(s)/side effects and non-pharmacologic comfort measures Outcome: Progressing   Problem: Health Behavior/Discharge Planning: Goal: Ability to manage health-related needs will improve Outcome: Progressing   Problem: Clinical Measurements: Goal: Ability to maintain clinical measurements within normal limits will improve Outcome: Progressing Goal: Diagnostic test results will improve Outcome: Progressing Goal: Respiratory complications will improve Outcome: Progressing Goal: Cardiovascular complication will be avoided Outcome: Progressing   Problem: Activity: Goal: Risk for activity intolerance will decrease Outcome: Progressing   Problem: Elimination: Goal: Will not experience complications related to bowel motility Outcome: Progressing Goal: Will not experience complications related to urinary retention Outcome: Progressing   Problem: Pain Managment: Goal: General experience of comfort will improve Outcome: Progressing   Problem: Safety: Goal: Ability to remain free from injury will improve Outcome: Progressing   Problem: Skin Integrity: Goal: Risk for impaired skin integrity will decrease Outcome: Progressing

## 2019-04-23 NOTE — Progress Notes (Signed)
Timothy [name]   DOB:1980-05-05   [id number]    Assessment & Plan:   Fever, leukocytosis -Etiology unclear; ddx includes infection, DVT and recurrent testicular cancer -WBC 15.5k today, stable; Tmax 100 over past 24 hours -I independently reviewed the radiologic images of CT abdomen/pelvis from 04/22/2019, which demonstrated a stable retroperitoneal hematoma, no definite evidence of abscess -Dr. Zigmund Daniel discussed the case with IR, who felt that abscess was less likely and did not recommend drainage -Blood cultures from 04/23/2019 pending -Given the persistent leukocytosis, it would be reasonable to make sure that blood cultures remain negative before considering starting chemotherapy due to the high risk of immunosuppression  -Continue empiric broad spectrum abx per hospitalist  Left lower extremity DVT -S/p thrombolysis and IVC stent placement -Currently on heparin gtt -Patient denies any symptoms of bleeding -Continue anticoagulation per protocol  Colonic ileus -CT on 04/22/2019 showed slightly increased diffuse colonic ileus -I discussed with the patient the importance of ambulation, which will help improve motility -As his ileus is worse on CT, chemotherapy can increase the risk of neutropenic colitis/enteritis -Hopefully the ileus will improve in the next a few days, and the patient's primary oncologist, Dr. Burr Medico, can determine the timing of chemotherapy   AKI -Etiology unclear -CT showed mild R hydronephrosis, possibly due to ureteral obstruction by pelvic adenopathy -However, renal US did not show any significant hydronephrosis -Urology consulted; appreciate their assistance  -Please check daily CMP to monitor renal function closely  -As cisplatin is nephrotoxic, the renal function should improve (ideally normalize) before starting chemotherapy  Progressive anemia -Likely multifactorial, including anemia of chronic disease, frequent blood draws, and retroperitoneal hematoma -Hgb  6.7 today -I discussed with the patient the rationale for blood transfusion, as well as some of the potential risks, including transfusion reactions and blood born infection -Patient expressed understanding and agreed with proceeding with transfusion -Goal Hgb > 7  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 Burr Medico, when she returns on Monday, 04/24/2019    Tish Men, MD 04/23/2019  10:06 AM   Subjective:  [name] was sitting up in a chair this morning.  He reports that despite trying to walk more in the hall, his abdominal distention had somewhat worsened.  His urine is dark, but there is no frank hematuria, hematochezia or melena.  He is on a liquid diet.  He denies any other complaint.   ROS: Constitutional: ( - ) fevers, ( - )  chills , ( - ) night sweats Ears, nose, mouth, throat, and face: ( - ) mucositis, ( - ) sore throat Respiratory: ( - ) cough, ( - ) dyspnea, ( - ) wheezes Cardiovascular: ( - ) palpitation, ( - ) chest discomfort, ( + ) lower extremity swelling Gastrointestinal:  ( - ) nausea, ( - ) heartburn, ( + ) change in bowel habits Skin: ( - ) abnormal skin rashes Behavioral/Psych: ( - ) mood change, ( - ) new changes  All other systems were reviewed with the patient and are negative.  Objective:  Vitals:   04/23/19 0150 04/23/19 0521  BP: (!) 141/88 (!) 145/90  Pulse: (!) 115 (!) 112  Resp: 18 18  Temp: 99.5 F (37.5 C) 100 F (37.8  C)  SpO2: 98% 96%     Intake/Output Summary (Last 24 hours) at 04/23/2019 1006 Last data filed at 04/23/2019 0151 Gross per 24 hour  Intake 240 ml  Output 2100 ml  Net -1860 ml    GENERAL:  alert, no distress and comfortable sitting in a chair  EYES: conjunctiva are pink and non-injected, sclera clear NECK: supple LUNGS: clear to auscultation with normal breathing effort HEART: regular rhythm, tachycardic, and no murmurs, and 2+ bilateral lower extremity edema ABDOMEN: firm, moderately distended, no significant tenderness with palpation, slightly decreased bowel sounds PSYCH: alert & oriented x 3, fluent speech   Labs:  Lab Results  Component Value Date   WBC 15.5 (H) 04/23/2019   HGB 6.7 (LL) 04/23/2019   HCT 20.3 (L) 04/23/2019   MCV 89.0 04/23/2019   PLT 454 (H) 04/23/2019   NEUTROABS 6.3 04/20/2019    Lab Results  Component Value Date   NA 138 04/22/2019   K 3.0 (L) 04/22/2019   CL 107 04/22/2019   CO2 21 (L) 04/22/2019    Studies:  CT ABDOMEN PELVIS WO CONTRAST  Result Date: 04/22/2019 CLINICAL DATA:  Fever, abdominal distension, testicular cancer, status post right orchiectomy EXAM: CT ABDOMEN AND PELVIS WITHOUT CONTRAST TECHNIQUE: Multidetector CT imaging of the abdomen and pelvis was performed following the standard protocol without IV contrast. COMPARISON:  Abdominal radiographs, 04/21/2019, CT abdomen pelvis, 04/17/2018, 04/11/2019 FINDINGS: Lower chest: Unchanged small bilateral pleural effusions and associated atelectasis or consolidation. Hepatobiliary: No solid liver abnormality is seen. No gallstones, gallbladder wall thickening, or biliary dilatation. Pancreas: Unremarkable. No pancreatic ductal dilatation or surrounding inflammatory changes. Spleen: Normal in size without significant abnormality. Adrenals/Urinary Tract: Adrenal glands are unremarkable. Mild right hydronephrosis, slightly increased compared to prior examination. Foley catheter in the urinary bladder. Stomach/Bowel: Stomach is within normal limits. The colon remains diffusely gas and fluid-filled to the rectum, caliber of the colon slightly increased, measuring up to 9 cm in the transverse  colon. Sigmoid diverticulosis. Vascular/Lymphatic: Stent of the infrarenal IVC. Unchanged bulky retroperitoneal lymphadenopathy, largest left retroperitoneal nodes measuring up to 3.5 x 2.8 cm (series 3, image 60). Reproductive: No mass or other significant abnormality. Other: Anasarca. Retroperitoneal hematomas in the left psoas (series 3, image 57) and in the retroperitoneal fat posterior to the right psoas (series 3, image 66), not significantly changed. Musculoskeletal: No acute or significant osseous findings. IMPRESSION: 1. The colon remains diffusely gas and fluid-filled to the rectum, slightly increased in caliber compared to prior examination. Findings favor ileus. No specific findings to suggest infectious or inflammatory colitis. 2. Retroperitoneal hematomas in the left psoas and retroperitoneal fat posterior to the right psoas are not significantly changed. 3. Unchanged bulky retroperitoneal lymphadenopathy. 4. Mild right hydronephrosis, slightly increased compared to prior examination, the mid to distal ureter presumably obstructed by adjacent lymph nodes. 5. Pleural effusions and anasarca. 6. Status post right orchiectomy. Electronically Signed   By: Eddie Candle M.D.   On: 04/22/2019 13:21   US RENAL  Result Date: 04/22/2019 CLINICAL DATA:  Elevated serum creatinine level. EXAM: RENAL / URINARY TRACT ULTRASOUND COMPLETE COMPARISON:  April 17, 2019. FINDINGS: Right Kidney: Renal measurements: 13.9 x 6.4 x 6.3 cm = volume: 294 mL . Echogenicity within normal limits. No mass or hydronephrosis visualized. Left Kidney: Renal measurements: 12.2 x 6.3 x 6.2 cm = volume: 246 mL. Echogenicity within normal limits. No mass or hydronephrosis visualized. Bladder: Appears normal for degree of bladder distention. Foley catheter is noted. Other: None.  IMPRESSION: No significant renal abnormality is noted. Electronically Signed   By: Marijo Conception M.D.   On: 04/22/2019 16:53   [place] EKG SITE RITE  Result  Date: 04/21/2019 If Site Rite image not attached, placement could not be confirmed due to current cardiac rhythm.

## 2019-04-24 ENCOUNTER — Inpatient Hospital Stay (HOSPITAL_COMMUNITY): Payer: BLUE CROSS/BLUE SHIELD

## 2019-04-24 ENCOUNTER — Inpatient Hospital Stay: Payer: Self-pay

## 2019-04-24 LAB — CBC
HCT: 22.6 % — ABNORMAL LOW (ref 39.0–52.0)
Hemoglobin: 7.2 g/dL — ABNORMAL LOW (ref 13.0–17.0)
MCH: 29.1 pg (ref 26.0–34.0)
MCHC: 31.9 g/dL (ref 30.0–36.0)
MCV: 91.5 fL (ref 80.0–100.0)
Platelets: 340 10*3/uL (ref 150–400)
RBC: 2.47 MIL/uL — ABNORMAL LOW (ref 4.22–5.81)
RDW: 14.3 % (ref 11.5–15.5)
WBC: 13.4 10*3/uL — ABNORMAL HIGH (ref 4.0–10.5)
nRBC: 0.7 % — ABNORMAL HIGH (ref 0.0–0.2)

## 2019-04-24 LAB — BASIC METABOLIC PANEL
Anion gap: 7 (ref 5–15)
BUN: 16 mg/dL (ref 6–20)
CO2: 23 mmol/L (ref 22–32)
Calcium: 7.6 mg/dL — ABNORMAL LOW (ref 8.9–10.3)
Chloride: 105 mmol/L (ref 98–111)
Creatinine, Ser: 1.39 mg/dL — ABNORMAL HIGH (ref 0.61–1.24)
GFR calc Af Amer: >60 mL/min (ref >60)
GFR calc non Af Amer: >60 mL/min (ref >60)
Glucose, Bld: 127 mg/dL — ABNORMAL HIGH (ref 70–99)
Potassium: 2.8 mmol/L — ABNORMAL LOW (ref 3.5–5.1)
Sodium: 135 mmol/L (ref 135–145)

## 2019-04-24 LAB — BPAM RBC
Blood Product Expiration Date: 202103112359
Blood Product Expiration Date: 202103102359
ISSUE DATE / TIME: 202102072234
Unit Type and Rh: 5100
Unit Type and Rh: 5100

## 2019-04-24 LAB — TYPE AND SCREEN
ABO/RH(D): O POS
ABO/RH(D): O POS
Antibody Screen: NEGATIVE
Antibody Screen: NEGATIVE
Unit division: 0
Unit division: 0

## 2019-04-24 LAB — MAGNESIUM: Magnesium: 2.5 mg/dL — ABNORMAL HIGH (ref 1.7–2.4)

## 2019-04-24 LAB — HEPARIN LEVEL (UNFRACTIONATED): Heparin Unfractionated: 0.63 IU/mL (ref 0.30–0.70)

## 2019-04-24 LAB — T4, FREE: Free T4: 1.09 ng/dL (ref 0.61–1.12)

## 2019-04-24 LAB — ABO/RH: ABO/RH(D): O POS

## 2019-04-24 LAB — TSH: TSH: 6.484 u[IU]/mL — ABNORMAL HIGH (ref 0.350–4.500)

## 2019-04-24 MED ORDER — POTASSIUM CHLORIDE 2 MEQ/ML IV SOLN: Freq: Once | INTRAVENOUS | Status: DC

## 2019-04-24 MED ORDER — POTASSIUM CHLORIDE CRYS ER 20 MEQ PO TBCR
40.0000 meq | EXTENDED_RELEASE_TABLET | Freq: Two times a day (BID) | ORAL | Status: AC
  Administered 2019-04-24 (×2): 40 meq via ORAL
  Filled 2019-04-24 (×2): qty 2

## 2019-04-24 MED ORDER — SODIUM CHLORIDE 0.9% FLUSH: 10.0000 mL | INTRAVENOUS | Status: DC | PRN

## 2019-04-24 MED ORDER — SODIUM CHLORIDE 0.9 % IV SOLN
10.0000 mg | Freq: Once | INTRAVENOUS | Status: AC
  Administered 2019-04-24: 16:00:00 10 mg via INTRAVENOUS
  Filled 2019-04-24: qty 1

## 2019-04-24 MED ORDER — SODIUM CHLORIDE 0.9 % IV SOLN
150.0000 mg | Freq: Once | INTRAVENOUS | Status: AC
  Administered 2019-04-24: 150 mg via INTRAVENOUS
  Filled 2019-04-24: qty 5

## 2019-04-24 MED ORDER — KCL IN DEXTROSE-NACL 20-5-0.45 MEQ/L-%-% IV SOLN
Freq: Once | INTRAVENOUS | Status: AC
  Filled 2019-04-24: qty 1000

## 2019-04-24 MED ORDER — PALONOSETRON HCL INJECTION 0.25 MG/5ML
0.2500 mg | Freq: Once | INTRAVENOUS | Status: AC
  Administered 2019-04-24: 0.25 mg via INTRAVENOUS
  Filled 2019-04-24: qty 5

## 2019-04-24 MED ORDER — HOT PACK MISC ONCOLOGY
1.0000 | Freq: Once | Status: AC | PRN
  Filled 2019-04-24: qty 1

## 2019-04-24 MED ORDER — MORPHINE SULFATE (PF) 4 MG/ML IV SOLN: 4.0000 mg | INTRAVENOUS | Status: DC | PRN

## 2019-04-24 MED ORDER — SODIUM CHLORIDE 0.9 % IV SOLN: Freq: Once | INTRAVENOUS | Status: DC

## 2019-04-24 MED ORDER — SODIUM CHLORIDE 0.9 % IV SOLN
20.0000 mg/m2 | Freq: Once | INTRAVENOUS | Status: AC
  Administered 2019-04-24: 16:00:00 46 mg via INTRAVENOUS
  Filled 2019-04-24: qty 46

## 2019-04-24 MED ORDER — SODIUM CHLORIDE 0.9% FLUSH
10.0000 mL | Freq: Two times a day (BID) | INTRAVENOUS | Status: DC
  Administered 2019-04-24 – 2019-04-29 (×10): 10 mL

## 2019-04-24 MED ORDER — MORPHINE SULFATE (PF) 4 MG/ML IV SOLN
4.0000 mg | INTRAVENOUS | Status: DC | PRN
  Administered 2019-04-24 – 2019-04-29 (×8): 4 mg via INTRAVENOUS
  Filled 2019-04-24 (×8): qty 1

## 2019-04-24 MED ORDER — ENOXAPARIN SODIUM 100 MG/ML ~~LOC~~ SOLN
1.0000 mg/kg | Freq: Two times a day (BID) | SUBCUTANEOUS | Status: DC
  Administered 2019-04-24 – 2019-04-29 (×10): 100 mg via SUBCUTANEOUS
  Filled 2019-04-24 (×10): qty 1

## 2019-04-24 MED ORDER — SODIUM CHLORIDE 0.9 % IV SOLN
100.0000 mg/m2 | Freq: Once | INTRAVENOUS | Status: AC
  Administered 2019-04-24: 17:00:00 230 mg via INTRAVENOUS
  Filled 2019-04-24: qty 11.5

## 2019-04-24 NOTE — Progress Notes (Signed)
Spoke w/ Dr. Burr Medico, patient will be initiated cisplatin at etoposide today (full doses). IV access and unit for treatment being determined. Cisplatin pre/post-hydration will remain at standard electrolyte dosing today and adjusted as needed for tomorrow and subsequent days.    Demetrius Charity, PharmD, [place] [facility] Pharmacist Pharmacy Phone: [phone] 04/24/2019

## 2019-04-24 NOTE — Progress Notes (Signed)
Discussing with care teams IR and VAST team for PICC placement. Talking to coordinate placement. Follow up calls from [place] IR and [place] team. Will cont to work on coordinating placement, ASAP. SRP, RN

## 2019-04-24 NOTE — Progress Notes (Addendum)
ANTICOAGULATION CONSULT NOTE Pharmacy Consult for Heparin Indication: DVT  No Known Allergies  Patient Measurements: Height: 6\' 4"  (193 cm) Weight: 216 lb 14.9 oz (98.4 kg) IBW/kg (Calculated) : 86.8  Vital Signs: Temp: 98.7 F (37.1 C) (02/08 0540) Temp Source: Oral (02/08 0201) BP: 150/92 (02/08 0540) Pulse Rate: 102 (02/08 0540)  Labs: Recent Labs    04/22/19 0500 04/22/19 0500 04/23/19 0340 04/23/19 0340 04/23/19 0930 04/23/19 1406 04/24/19 0414  HGB 7.0*   < > 6.7*   < >  --  7.0* 7.2*  HCT 21.2*   < > 20.3*  --   --  21.4* 22.6*  PLT 488*   < > 454*  --   --  446* 340  HEPARINUNFRC 0.56  --  0.38  --   --   --  0.63  CREATININE 1.46*  --   --   --  1.32*  --  1.39*   < > = values in this interval not displayed.    Estimated Creatinine Clearance: 90.2 mL/min (A) (by C-G formula based on SCr of 1.39 mg/dL (H)).   Assessment: Timothy [name] is a 39 y.o. male who has hx of testicular CA s/p right orchiectomy 2012, developed LLE edema after traveling and found to have extensive DVT. Underwent mechanical and pharmacological thrombectomy 1/30 and 1/31 and also had IVC stent placed. Pharmacy has been consulted to dose heparin infusion. Plan to continue heparin for now then transition to enoxaparin for 30 days.  Today, 04/24/2019:  CBC: Hgb remains low; both retroperitoneal hematomas unchanged on 2/6 CT a/p; some old blood noted in urine by urology. Did receive 2 units PRBC after previously refusing. Plt WNL  Most recent heparin level therapeutic on 2600 units/hr  No further hematuria or other s/s bleeding per RN; no infusion issues noted  Needs PICC placement for chemo later today  Goal of Therapy: Heparin level 0.3-0.7 units/ml Monitor platelets by anticoagulation protocol: Yes  Plan:  Continue heparin infusion at 2600 units/hr  Daily Heparin level/CBC while on heparin  Monitor for signs/symptoms of bleeding  May need heparin paused for PICC placement;  will f/u to ensure appropriately resumed if so  Follow up transition to enoxaparin closer to discharge/transfer  Reuel Boom, PharmD, BCPS [phone] 04/24/2019, 9:56 AM   ADDENDUM  MD requesting change to Lovenox today  Will await completion of PICC placement and transition to Lovenox 100 mg SQ bid this evening  Reuel Boom, PharmD, BCPS [phone] 04/24/2019, 11:59 AM

## 2019-04-24 NOTE — Progress Notes (Signed)
PROGRESS NOTE  Timothy [name] HYQ:[id number] DOB: [birth year] DOA: 04/10/2019 PCP: Patient, No Pcp Per  Brief Summary: History of testicular carcinoma stage Ib followed at [facility] had right orchiectomy lives in [place] travel to [place] to visit his mother admitted to [facility] due to acute LLE DVT and found to have recurrent cancer. Pt was transferred to [place] for mechanical thrombolysis of LLE DVT, hospital course complicated by retroperitoneal hematoma, psoas hematoma, anemia, ileus,  AKI, new mild right hydronephrosis presumably from external compression,  fever of unknown origin on empirical abx due to underline immunosuppressed status. Pt was transferred back to [place] per oncology request to start chemo.    Today, met patient sitting in chair, reports generalized soreness, otherwise no significant pain.  Abdomen still distended, but currently improving especially after ambulation.  Having BMs.  Still noted to have persistent sinus tachycardia, BP stable, no hypoxia.  Noted to have a temp spike of 101.3 last night.   Assessment/Plan: Principal Problem:   DVT (deep venous thrombosis) (HCC) Active Problems:   H/O testicular cancer   Lymphadenopathy, retroperitoneal   Seminoma (HCC)   Acute deep vein thrombosis (DVT) of left lower extremity (HCC)   Elevated serum creatinine   Abdominal distension   Abscess after procedure   Fever   Ileus (HCC)   Left lower extremity DVT s/p mechanical and pharmacological thrombolysis with stenting of the IVC for resistant malignant narrowing (IR Dr Earleen Newport on 1/31) Noted bulky retroperitoneal lymphadenopathy and IVC occlusionin the setting of testicular cancer S/P heparin gtt, transition to [place] therapeutic lovenox Long term anticoagulation plan: Lovenox to be continued for 30 days once discharged then transition to oral anticoagulation 90 days of aspirin 81 mg daily Elevate leg, compression stocking Per Dr. Stephenie Acres.  Lavon Paganini, Assistant Professor of Radiology, has kindly agreed to see [name] in continuity of his current vascular care, when Mr Ghuman returns home Scheduling: [place] Office: [phone] Dr. Ottis Stain has also kindly arranged follow-up with the Medical Oncology Department of [facility], and Mr Bolds should have a pending office appointment upon arrival  Fever of unknown origin/leukocytosis Last febrile spike on 04/23/2019, 101.25F, still with leukocytosis ??Malignancy Vs hematoma, no obvious source of infection BC/UC are negative Echo normal ejection fraction, no evidence of valvular infection Repeat chest x-ray pending Oncology Dr Burr Medico recommended to keep empiric abx for now Continue cefepime/Flagyl  Sinus tachycardia Likely reactive CTA chest, no PE noted, although motion artifact TSH 6.484, free T4 pending Decision was made to DC telemetry for now as patient needs to start chemotherapy on the sixth floor, of which has no telemetry beds Monitor closely  Hypokalemia Replace as needed  AKI/ new onset urinary retention /mild right hydro Cr peaked at 1.46, appear trending down Urology input appreciated  Daily CMP  Normocytic anemia Hemoglobin trending down Multifactorial, from acute illness, psoas hematoma, hematuria, malignancy S/p 1 U of PRBC Daily CBC  Ileus Noted abdominal distention Having BM Ambulate as tolerated  Recurrent testicular cancer  Noted retroperitoneal lymphoadenopathy Oncology Dr Burr Medico on board, started chemo on 04/24/19      DVT Prophylaxis: [place] therapeutic Lovenox  Code Status: Full  Family Communication: Patient requested not to speak to his family  Disposition Plan: Plan to discharge home once completed chemotherapy, consultant signing off   Consultants:  IR  Oncology  Urology  Critical care  Procedures:  Mechanical thrombolysis and IVC placement  Antibiotics:  Cefepime  Flagyl   Objective:  BP  (!) 145/97 (BP Location: Left Arm)   Pulse (!) 108   Temp 100.2 F (37.9 C) (Oral)   Resp (!) 21   Ht '6\' 4"'  (1.93 m)   Wt 98.4 kg   SpO2 95%   BMI 26.41 kg/m   Intake/Output Summary (Last 24 hours) at 04/24/2019 1515 Last data filed at 04/24/2019 1346 Gross per 24 hour  Intake 2243.84 ml  Output 4175 ml  Net -1931.16 ml   Filed Weights   04/10/19 2005 04/18/19 0600  Weight: 90.7 kg 98.4 kg    Exam:  General: NAD   Cardiovascular: S1, S2 present, tachycardic  Respiratory:  Diminished breath sounds at the bases  Abdomen: Soft, nontender, distended, bowel sounds present  Musculoskeletal: 2+ bilateral pedal edema noted  Skin: Normal  Psychiatry: Normal mood   Data Reviewed: Basic Metabolic Panel: Recent Labs  Lab 04/20/19 0231 04/20/19 1038 04/21/19 0409 04/22/19 0500 04/23/19 0930 04/24/19 0414  NA 136  --  137 138 135 135  K 3.4*  --  3.5 3.0* 3.3* 2.8*  CL 106  --  103 107 106 105  CO2 19*  --  20* 21* 19* 23  GLUCOSE 116*  --  107* 108* 113* 127*  BUN 8  --  '11 13 13 16  ' CREATININE 1.01  --  1.33* 1.46* 1.32* 1.39*  CALCIUM 7.6*  --  7.7* 7.5* 7.5* 7.6*  MG  --  2.5*  --   --   --  2.5*   Liver Function Tests: Recent Labs  Lab 04/19/19 0151 04/20/19 0231 04/21/19 0409 04/22/19 0500 04/23/19 0930  AST 30 33 35 37 32  ALT '24 26 28 28 27  ' ALKPHOS 84 78 69 60 67  BILITOT 1.0 1.2 1.1 0.7 0.8  PROT 6.1* 6.1* 5.9* 5.9* 6.1*  ALBUMIN 2.3* 2.3* 2.3* 2.2* 2.3*   No results for input(s): LIPASE, AMYLASE in the last 168 hours. No results for input(s): AMMONIA in the last 168 hours. CBC: Recent Labs  Lab 04/20/19 0231 04/20/19 0231 04/21/19 0409 04/22/19 0500 04/23/19 0340 04/23/19 1406 04/24/19 0414  WBC 8.4   < > 10.1 15.8* 15.5* 16.5* 13.4*  NEUTROABS 6.3  --   --   --   --   --   --   HGB 7.3*   < > 7.0* 7.0* 6.7* 7.0* 7.2*  HCT 22.6*   < > 21.1* 21.2* 20.3* 21.4* 22.6*  MCV 91.5   < > 89.8 89.8 89.0 90.3 91.5  PLT 398   < > 453* 488*  454* 446* 340   < > = values in this interval not displayed.   Cardiac Enzymes:   Recent Labs  Lab 04/20/19 0231  CKTOTAL 624*   BNP (last 3 results) No results for input(s): BNP in the last 8760 hours.  ProBNP (last 3 results) No results for input(s): PROBNP in the last 8760 hours.  CBG: No results for input(s): GLUCAP in the last 168 hours.  Recent Results (from the past 240 hour(s))  Culture, blood (routine x 2)     Status: None   Collection Time: 04/17/19 10:15 AM   Specimen: BLOOD RIGHT HAND  Result Value Ref Range Status   Specimen Description BLOOD RIGHT HAND  Final   Special Requests   Final    BOTTLES DRAWN AEROBIC ONLY Blood Culture results may not be optimal due to an inadequate volume of blood received in culture bottles   Culture   Final  NO GROWTH 5 DAYS Performed at [facility], [place] [address], [place], [place] [address]    Report Status 04/22/2019 FINAL  Final  Culture, blood (routine x 2)     Status: None   Collection Time: 04/17/19 10:20 AM   Specimen: BLOOD  Result Value Ref Range Status   Specimen Description BLOOD LEFT ANTECUBITAL  Final   Special Requests   Final    BOTTLES DRAWN AEROBIC AND ANAEROBIC Blood Culture adequate volume   Culture   Final    NO GROWTH 5 DAYS Performed at [facility], [place] [address] [place], [place] [address]    Report Status 04/22/2019 FINAL  Final  Culture, Urine     Status: None   Collection Time: 04/17/19 10:43 AM   Specimen: Urine, Random  Result Value Ref Range Status   Specimen Description URINE, RANDOM  Final   Special Requests NONE  Final   Culture   Final    NO GROWTH Performed at [facility], [place] [address] [place], [place] [address]    Report Status 04/18/2019 FINAL  Final  Culture, blood (routine x 2)     Status: None (Preliminary result)   Collection Time: 04/22/19 11:58 AM   Specimen: BLOOD LEFT WRIST  Result Value Ref Range Status   Specimen Description BLOOD LEFT WRIST   Final   Special Requests   Final    AEROBIC BOTTLE ONLY Blood Culture adequate volume Performed at [facility], [place] [address] [place],  [address]    Culture NO GROWTH < 24 HOURS  Final   Report Status PENDING  Incomplete  Culture, blood (routine x 2)     Status: None (Preliminary result)   Collection Time: 04/22/19 11:59 AM   Specimen: BLOOD LEFT HAND  Result Value Ref Range Status   Specimen Description BLOOD LEFT HAND  Final   Special Requests   Final    AEROBIC BOTTLE ONLY Blood Culture adequate volume Performed at [facility], [place] [address] [place],  [address]    Culture NO GROWTH < 24 HOURS  Final   Report Status PENDING  Incomplete     Studies: [place] EKG SITE RITE  Result Date: 04/24/2019 If Site Rite image not attached, placement could not be confirmed due to current cardiac rhythm.   Scheduled Meds: . sodium chloride   Intravenous Once  . Chlorhexidine Gluconate Cloth  6 each Topical Daily  . CISplatin  20 mg/m2 (Treatment Plan Recorded) Intravenous Once  . enoxaparin (LOVENOX) injection  1 mg/kg Subcutaneous Q12H  . etoposide  100 mg/m2 (Treatment Plan Recorded) Intravenous Once  . feeding supplement  1 Container Oral TID BM  . feeding supplement (PRO-STAT SUGAR FREE 64)  30 mL Oral BID  . multivitamin with minerals  1 tablet Oral Daily  . pantoprazole (PROTONIX) IV  40 mg Intravenous Q12H  . polyethylene glycol  17 g Oral Daily  . potassium chloride  40 mEq Oral BID    Continuous Infusions: . sodium chloride    . ceFEPime (MAXIPIME) IV 2 g (04/24/19 1408)  . dexamethasone (DECADRON) IVPB ([facility])    . fosaprepitant (EMEND) IV infusion 150 mg 150 mg (04/24/19 1505)  . heparin 2,600 Units/hr (04/24/19 1116)  . metronidazole 500 mg (04/24/19 0941)      Alma Friendly MD  [facility]     04/24/2019, 3:15 PM  LOS: 13 days

## 2019-04-24 NOTE — Progress Notes (Signed)
Chemotherapy dosing for Cisplatin and Etoposide independently checked with Laural Benes, RN based on patient's BSA and normal dosing of chemotherapy.    Consent signed and on chart.

## 2019-04-24 NOTE — Progress Notes (Signed)
After speaking with pt's nurse and PICC nurse, advised pt's nurse to have physician place order for PICC insertion by IVT. Advised pt's nurse that PICC team will not be available to place PICC until later in day. Sophia, RN verbalized understanding.

## 2019-04-24 NOTE — Progress Notes (Signed)
Per discussion with Dr Burr Medico, [facility] to tx with Hgb=7.2.  Mag=2.5.  Will remove mag from hydration fluids today and reevaluate tomorrow.

## 2019-04-24 NOTE — Progress Notes (Signed)
HEMATOLOGY-ONCOLOGY PROGRESS NOTE  SUBJECTIVE: Pt was transferred from [facility] to [facility].  He had last night, no chills, or sick feeling, he actually did not know he had a fever, it was only noticed on vital sign checkup.  Is abdominal distention is improving, he is still on liquid, tolerating boost very well.  He was able to ambulate yesterday, overall feels some improvement.  I have reviewed the past medical history, past surgical history, social history and family history with the patient and they are unchanged from previous note.   PHYSICAL EXAMINATION:  Vitals:   04/24/19 0201 04/24/19 0540  BP: (!) 157/95 (!) 150/92  Pulse: 100 (!) 102  Resp: 18 20  Temp: 98.4 F (36.9 C) 98.7 F (37.1 C)  SpO2: 94% 93%   Filed Weights   04/10/19 2005 04/18/19 0600  Weight: 200 lb (90.7 kg) 216 lb 14.9 oz (98.4 kg)    Intake/Output from previous day: 02/07 0701 - 02/08 0700 In: 2483.8 [P.O.:480; I.V.:1023.8; Blood:630; IV Piggyback:350] Out: 2992 [Urine:3050]  GENERAL:alert, no distress and comfortable LEGS: Mild left lower extremity edema, no tenderness, or skin erythema, no edema on right leg  Abdomen: soft, nontender   LABORATORY DATA:  I have reviewed the data as listed CMP Latest Ref Rng & Units 04/24/2019 04/23/2019 04/22/2019  Glucose 70 - 99 mg/dL 127(H) 113(H) 108(H)  BUN 6 - 20 mg/dL _0 Creatinine 0.61 - 1.24 mg/dL 1.39(H) 1.32(H) 1.46(H)  Sodium 135 - 145 mmol/L 135 135 138  Potassium 3.5 - 5.1 mmol/L 2.8(L) 3.3(L) 3.0(L)  Chloride 98 - 111 mmol/L 105 106 107  CO2 22 - 32 mmol/L 23 19(L) 21(L)  Calcium 8.9 - 10.3 mg/dL 7.6(L) 7.5(L) 7.5(L)  Total Protein 6.5 - 8.1 g/dL - 6.1(L) 5.9(L)  Total Bilirubin 0.3 - 1.2 mg/dL - 0.8 0.7  Alkaline Phos 38 - 126 U/L - 67 60  AST 15 - 41 U/L - 32 37  ALT 0 - 44 U/L - 27 28    Lab Results  Component Value Date   WBC 13.4 (H) 04/24/2019   HGB 7.2 (L) 04/24/2019   HCT 22.6 (L) 04/24/2019   MCV 91.5  04/24/2019   PLT 340 04/24/2019   NEUTROABS 6.3 04/20/2019    CT ABDOMEN PELVIS WO CONTRAST  Result Date: 04/22/2019 CLINICAL DATA:  Fever, abdominal distension, testicular cancer, status post right orchiectomy EXAM: CT ABDOMEN AND PELVIS WITHOUT CONTRAST TECHNIQUE: Multidetector CT imaging of the abdomen and pelvis was performed following the standard protocol without IV contrast. COMPARISON:  Abdominal radiographs, 04/21/2019, CT abdomen pelvis, 04/17/2018, 04/11/2019 FINDINGS: Lower chest: Unchanged small bilateral pleural effusions and associated atelectasis or consolidation. Hepatobiliary: No solid liver abnormality is seen. No gallstones, gallbladder wall thickening, or biliary dilatation. Pancreas: Unremarkable. No pancreatic ductal dilatation or surrounding inflammatory changes. Spleen: Normal in size without significant abnormality. Adrenals/Urinary Tract: Adrenal glands are unremarkable. Mild right hydronephrosis, slightly increased compared to prior examination. Foley catheter in the urinary bladder. Stomach/Bowel: Stomach is within normal limits. The colon remains diffusely gas and fluid-filled to the rectum, caliber of the colon slightly increased, measuring up to 9 cm in the transverse colon. Sigmoid diverticulosis. Vascular/Lymphatic: Stent of the infrarenal IVC. Unchanged bulky retroperitoneal lymphadenopathy, largest left retroperitoneal nodes measuring up to 3.5 x 2.8 cm (series 3, image 60). Reproductive: No mass or other significant abnormality. Other: Anasarca. Retroperitoneal hematomas in the left psoas (series 3, image 57) and in the retroperitoneal fat posterior to the right  psoas (series 3, image 66), not significantly changed. Musculoskeletal: No acute or significant osseous findings. IMPRESSION: 1. The colon remains diffusely gas and fluid-filled to the rectum, slightly increased in caliber compared to prior examination. Findings favor ileus. No specific findings to suggest  infectious or inflammatory colitis. 2. Retroperitoneal hematomas in the left psoas and retroperitoneal fat posterior to the right psoas are not significantly changed. 3. Unchanged bulky retroperitoneal lymphadenopathy. 4. Mild right hydronephrosis, slightly increased compared to prior examination, the mid to distal ureter presumably obstructed by adjacent lymph nodes. 5. Pleural effusions and anasarca. 6. Status post right orchiectomy. Electronically Signed   By: Eddie Candle M.D.   On: 04/22/2019 13:21   DG Abd 1 View  Result Date: 04/21/2019 CLINICAL DATA:  Ileus, history of IVC stent placement EXAM: ABDOMEN - 1 VIEW COMPARISON:  04/20/2019 FINDINGS: Supine frontal view of the abdomen and pelvis excludes the left flank and lower pelvis by collimation. There is progressive gaseous distension of large and small bowel. IVC stent unchanged. No masses or abnormal calcifications. IMPRESSION: Progressive gaseous distension of large and small bowel consistent with ileus. Electronically Signed   By: Randa Ngo M.D.   On: 04/21/2019 08:42   DG Abd 1 View  Result Date: 04/20/2019 CLINICAL DATA:  Diffuse abdominal tenderness, abdominal distension EXAM: ABDOMEN - 1 VIEW COMPARISON:  04/19/2019 FINDINGS: 2 supine frontal views of the abdomen and pelvis demonstrate persistent diffuse gaseous distention of the colon likely postoperative ileus. No masses or abnormal calcifications. Venous stent again noted. IMPRESSION: 1. Continued postoperative ileus. Electronically Signed   By: Randa Ngo M.D.   On: 04/20/2019 15:10   DG Abd 1 View  Result Date: 04/19/2019 CLINICAL DATA:  Evaluate ileus. EXAM: ABDOMEN - 1 VIEW COMPARISON:  April 19, 2019 (acquired at 8:04 a.m. FINDINGS: Numerous dilated air-filled loops of large bowel are again seen. This is unchanged in appearance when compared to the prior study. The visualized small bowel loops are normal in caliber. A radiopaque stent is seen overlying the medial aspect of  the mid right abdomen. No radio-opaque calculi or other significant radiographic abnormality are seen. IMPRESSION: 1. Stable findings likely consistent with a postoperative ileus, unchanged in appearance when compared to the prior study dated April 19, 2019 (acquired at 8:04 a.m.). Electronically Signed   By: Virgina Norfolk M.D.   On: 04/19/2019 20:28   DG Abd 1 View  Result Date: 04/19/2019 CLINICAL DATA:  Generalized abdominal pain, postoperative ileus. EXAM: ABDOMEN - 1 VIEW COMPARISON:  April 18, 2019. FINDINGS: Stable dilated air-filled colon is noted most consistent with postoperative ileus. No significant small bowel dilatation is noted. Phleboliths are noted in the pelvis. IVC stent is again noted. IMPRESSION: Stable dilated air-filled colon is noted most consistent with postoperative ileus. Electronically Signed   By: Marijo Conception M.D.   On: 04/19/2019 09:14   CT ANGIO CHEST PE W OR WO CONTRAST  Result Date: 04/19/2019 CLINICAL DATA:  Hypoxia. EXAM: CT ANGIOGRAPHY CHEST WITH CONTRAST TECHNIQUE: Multidetector CT imaging of the chest was performed using the standard protocol during bolus administration of intravenous contrast. Multiplanar CT image reconstructions and MIPs were obtained to evaluate the vascular anatomy. CONTRAST:  155m OMNIPAQUE IOHEXOL 300 MG/ML SOLN, 870mOMNIPAQUE IOHEXOL 350 MG/ML SOLN COMPARISON:  04/11/2019 FINDINGS: Cardiovascular: Evaluation for acute pulmonary emboli is severely limited by respiratory motion artifact and suboptimal contrast bolus timing. Given these limitations, no large centrally located pulmonary embolism was detected.Detection of smaller pulmonary emboli is  not possible on this exam. The main pulmonary artery is within normal limits for size. There is no CT evidence of acute right heart strain. The visualized aorta is normal. Heart size is normal, without pericardial effusion. Mediastinum/Nodes: --No mediastinal or hilar lymphadenopathy. --No  axillary lymphadenopathy. --there is an enlarged left supraclavicular lymph node measuring 1.4 cm (axial series 5, image 5). --Normal thyroid gland. --The esophagus is unremarkable Lungs/Pleura: There is atelectasis at the lung bases bilaterally. There are new small bilateral pleural effusions, left greater than right. Upper Abdomen: No acute abnormality. Musculoskeletal: No chest wall abnormality. No acute or significant osseous findings. Review of the MIP images confirms the above findings. IMPRESSION: 1. Evaluation for acute pulmonary emboli is severely limited by respiratory motion artifact and suboptimal contrast bolus timing. Given these limitations, no large centrally located pulmonary embolism was detected. 2. New small bilateral pleural effusions, left greater than right. 3. Enlarged left supraclavicular lymph node measuring 1.4 cm. This may be malignant or reactive in etiology. It is amenable to percutaneous biopsy as clinically indicated. Electronically Signed   By: Constance Holster M.D.   On: 04/19/2019 23:17   CT Angio Chest PE W and/or Wo Contrast  Result Date: 04/11/2019 CLINICAL DATA:  Positive lower extremity DVT, PE suspected. Back pain and leg swelling, history of testicular cancer. EXAM: CT ANGIOGRAPHY CHEST CT ABDOMEN AND PELVIS WITH CONTRAST TECHNIQUE: Multidetector CT imaging of the chest was performed using the standard protocol during bolus administration of intravenous contrast. Multiplanar CT image reconstructions and MIPs were obtained to evaluate the vascular anatomy. Multidetector CT imaging of the abdomen and pelvis was performed using the standard protocol during bolus administration of intravenous contrast. CONTRAST:  180m OMNIPAQUE IOHEXOL 350 MG/ML SOLN COMPARISON:  None. FINDINGS: CTA CHEST FINDINGS Cardiovascular: Suboptimal opacification of the pulmonary arteries limits evaluation beyond the lobar level. No large central or lobar pulmonary arterial filling defects are  identified. Central pulmonary arteries are normal caliber. Normal cardiac size. 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 The aorta is normal caliber. Shared origin of the brachiocephalic and left common carotid arteries. Great vessels are otherwise unremarkable. Mediastinum/Nodes: No enlarged mediastinal, hilar or axillary adenopathy is seen. No acute abnormality of the trachea or esophagus. Thyroid gland is normal. Asymmetric thickening of the left sternocleidomastoid is likely related to muscle activation. Lungs/Pleura: Respiratory motion artifact is mild. No consolidation, features of edema, pneumothorax, or effusion. No suspicious pulmonary nodules or masses. Musculoskeletal: No acute osseous abnormality or suspicious osseous lesion in the imaged chest. No worrisome chest wall lesions. Mild bilateral gynecomastia is noted, right slightly greater left. Review of the MIP images confirms the above findings. CT ABDOMEN and PELVIS FINDINGS Hepatobiliary: No focal liver abnormality is seen. No gallstones, gallbladder wall thickening, or biliary dilatation. Pancreas: Unremarkable. No pancreatic ductal dilatation or surrounding inflammatory changes. Spleen: Normal in size without focal abnormality. Adrenals/Urinary Tract: Adrenal glands are unremarkable. Kidneys are normal, without renal calculi, focal lesion, or hydronephrosis. Bladder is unremarkable. Stomach/Bowel: Distal esophagus, stomach and duodenal sweep are unremarkable. No small bowel wall thickening or dilatation. No evidence of obstruction. A normal appendix is visualized. No colonic dilatation or wall thickening. Scattered colonic diverticula without focal pericolonic inflammation to suggest diverticulitis. Vascular/Lymphatic: There is extensive retroperitoneal and pelvic adenopathy, some of the larger nodes include a left periaortic node measuring up to 3.2 cm (2/43) and a 2 cm  aortocaval lymph node (2/35)  there is distal abdominal aortic encasement by a extensive retroperitoneal adenopathy. Furthermore there is likely direct compression and/or direct nodal involvement of the inferior vena cava (2/40 with the lower IVC appearing expanded by hypoattenuating filling defects. Edematous changes are noted throughout the pelvis with venous engorgement and collateralization. Reproductive: The prostate and seminal vesicles are unremarkable. There is a varicocele seen in the left scrotum. External genitalia are incompletely included on this exam. Other: There is circumferential body wall edema from the level of the pelvis inferiorly more pronounced throughout the left lower extremity. Free fluid is seen in the deep pelvis, likely reactive or secondary to venous occlusion with developing collateralization. Musculoskeletal: No acute osseous abnormality or suspicious osseous lesion. Normal symmetric muscular enhancement. Review of the MIP images confirms the above findings. IMPRESSION: 1. Suboptimal opacification of the pulmonary arteries limits evaluation beyond the lobar level. No large central or lobar pulmonary arterial filling defects are identified. 2. Extensive retroperitoneal and pelvic adenopathy. There is likely direct compression of the lower IVC with the distal IVC and iliac veins appearing expanded by hypoattenuating thrombus. There is complete encasement of the abdominal aorta as well but without luminal narrowing. Findings are highly concerning for metastatic disease, particularly given patient history of testicular cancer. 3. Left scrotal varicocele. External genitalia are incompletely included on this exam. Consider further evaluation scrotal ultrasound given extensive retroperitoneal adenopathy and patient's history of testicular cancer. 4. Free fluid in the deep pelvis is likely reactive or secondary to venous occlusion with developing collateralization. 5. Colonic diverticulosis  without evidence of diverticulitis. 6. Mild bilateral gynecomastia, right slightly greater left. 7. 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 Critical Value/emergent results were called by telephone at the time of interpretation on 04/11/2019 at 1:33 am to [place] , who verbally acknowledged these results. Electronically Signed   By: Lovena Le M.D.   On: 04/11/2019 01:33   MR BRAIN W WO CONTRAST  Result Date: 04/15/2019 CLINICAL DATA:  History of testicular cancer. Assess for metastatic disease. EXAM: MRI HEAD WITHOUT AND WITH CONTRAST TECHNIQUE: Multiplanar, multiecho pulse sequences of the brain and surrounding structures were obtained without and with intravenous contrast. CONTRAST:  51m GADAVIST GADOBUTROL 1 MMOL/ML IV SOLN COMPARISON:  None. FINDINGS: Brain: The brain has a normal appearance without evidence of malformation, atrophy, old or acute small or large vessel infarction, mass lesion, hemorrhage, hydrocephalus or extra-axial collection. After contrast administration, no abnormal enhancement occurs. Vascular: Major vessels at the base of the brain show flow. Venous sinuses appear patent. Skull and upper cervical spine: Normal. Sinuses/Orbits: Clear/normal. Other: None significant. IMPRESSION: Normal examination.  No evidence of metastatic disease. Electronically Signed   By: MNelson ChimesM.D.   On: 04/15/2019 11:07   UKoreaScrotum  Result Date: 04/11/2019 CLINICAL DATA:  Concern for testicular cancer EXAM: ULTRASOUND OF SCROTUM TECHNIQUE: Complete ultrasound examination of the testicles, epididymis, and other scrotal structures was performed. COMPARISON:  None. FINDINGS: Right testicle Measurements: Surgically removed. Left testicle Measurements: 3.9 x 2.2 x 2.6 cm. No mass or microlithiasis visualized. Right epididymis:  Not visualized Left epididymis:  Normal in size and appearance. Hydrocele:  Moderate left hydrocele  Varicocele:  Moderate left varicocele. IMPRESSION: No left testicular abnormality.  Prior right orchiectomy. Moderate left hydrocele and varicocele. Electronically Signed   By: KRolm BaptiseM.D.   On: 04/11/2019 02:16   CT ABDOMEN PELVIS W CONTRAST  Result Date:  04/18/2019 CLINICAL DATA:  Testicular carcinoma post RIGHT orchiectomy, LEFT lower extremity DVT, abdominal distension, recent CT with intra-abdominal and retroperitoneal lymphadenopathy concerning for metastatic cancer EXAM: CT ABDOMEN AND PELVIS WITH CONTRAST TECHNIQUE: Multidetector CT imaging of the abdomen and pelvis was performed using the standard protocol following bolus administration of intravenous contrast. Sagittal and coronal MPR images reconstructed from axial data set. CONTRAST:  174m OMNIPAQUE IOHEXOL 300 MG/ML SOLN IV. No oral contrast. COMPARISON:  04/11/2019 FINDINGS: Lower chest: Dependent bibasilar atelectasis in the lower lobes Hepatobiliary: New dependent density within gallbladder question vicarious excretion of contrast material from prior CT exam versus sludge. No gallbladder wall thickening. Liver unremarkable. Pancreas: Normal appearance Spleen: Normal appearance Adrenals/Urinary Tract: Adrenal glands, kidneys, and ureters normal appearance. Foley catheter and air within urinary bladder. Stomach/Bowel: Retrocecal appendix, normal. Gaseous distention of colon from cecum to descending colon. Small amount of stool in fluid within proximal colon. Tapering of the distal colon with few scattered distal colonic diverticula but no evidence of diverticulitis. Rectosigmoid colon decompressed. No discrete point of obstruction seen; findings favor ileus. Stomach and small bowel loops unremarkable Vascular/Lymphatic: Prior stenting of the IVC. Aorta normal caliber without aneurysm. IVC, iliac veins, common femoral veins patent without thrombus. Previously identified thrombus LEFT external iliac vein no longer seen. Again identified  significant retroperitoneal adenopathy unchanged. Reproductive: Seminal vesicles unremarkable. Minimal prostatic prominence for age. Other: Scattered stranding of retroperitoneal tissue planes increased from previous exam. No free air or free fluid. Tiny LEFT inguinal hernia containing fat Musculoskeletal: Osseous structures unremarkable. New enlargement of the LEFT psoas muscle with in ill-defined area of central low attenuation approximately 3.7 x 3.9 cm in axial dimensions extending 12 cm length, could represent LEFT psoas abscess or hematoma. This is new since 04/11/2019 making tumor infiltration unlikely. IMPRESSION: Mild gaseous distention of the colon through descending colon with decompressed rectosigmoid colon; no point of obstruction is identified, favor ileus. Persistent retroperitoneal adenopathy suspicious for metastatic disease. New ill-defined fluid collection within LEFT psoas muscle 3.7 x 3.9 x 12 cm question psoas abscess versus hematoma; LEFT psoas muscle was traversed by prior CT guided retroperitoneal lymph node biopsy. Increased retroperitoneal and mesenteric stranding. Bibasilar atelectasis. Findings called to Dr. MZigmund Danielon 04/18/2019 at 1108 hours. Electronically Signed   By: MLavonia DanaM.D.   On: 04/18/2019 11:08   CT ABDOMEN PELVIS W CONTRAST  Result Date: 04/11/2019 CLINICAL DATA:  Positive lower extremity DVT, PE suspected. Back pain and leg swelling, history of testicular cancer. EXAM: CT ANGIOGRAPHY CHEST CT ABDOMEN AND PELVIS WITH CONTRAST TECHNIQUE: Multidetector CT imaging of the chest was performed using the standard protocol during bolus administration of intravenous contrast. Multiplanar CT image reconstructions and MIPs were obtained to evaluate the vascular anatomy. Multidetector CT imaging of the abdomen and pelvis was performed using the standard protocol during bolus administration of intravenous contrast. CONTRAST:  1048mOMNIPAQUE IOHEXOL 350 MG/ML SOLN COMPARISON:   None. FINDINGS: CTA CHEST FINDINGS Cardiovascular: Suboptimal opacification of the pulmonary arteries limits evaluation beyond the lobar level. No large central or lobar pulmonary arterial filling defects are identified. Central pulmonary arteries are normal caliber. Normal cardiac size. 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 The aorta is normal caliber. Shared origin of the brachiocephalic and left common carotid arteries. Great vessels are otherwise unremarkable. Mediastinum/Nodes: No enlarged mediastinal, hilar or axillary adenopathy is seen. No acute abnormality of the trachea or esophagus. Thyroid  gland is normal. Asymmetric thickening of the left sternocleidomastoid is likely related to muscle activation. Lungs/Pleura: Respiratory motion artifact is mild. No consolidation, features of edema, pneumothorax, or effusion. No suspicious pulmonary nodules or masses. Musculoskeletal: No acute osseous abnormality or suspicious osseous lesion in the imaged chest. No worrisome chest wall lesions. Mild bilateral gynecomastia is noted, right slightly greater left. Review of the MIP images confirms the above findings. CT ABDOMEN and PELVIS FINDINGS Hepatobiliary: No focal liver abnormality is seen. No gallstones, gallbladder wall thickening, or biliary dilatation. Pancreas: Unremarkable. No pancreatic ductal dilatation or surrounding inflammatory changes. Spleen: Normal in size without focal abnormality. Adrenals/Urinary Tract: Adrenal glands are unremarkable. Kidneys are normal, without renal calculi, focal lesion, or hydronephrosis. Bladder is unremarkable. Stomach/Bowel: Distal esophagus, stomach and duodenal sweep are unremarkable. No small bowel wall thickening or dilatation. No evidence of obstruction. A normal appendix is visualized. No colonic dilatation or wall thickening. Scattered colonic diverticula without focal pericolonic inflammation to  suggest diverticulitis. Vascular/Lymphatic: There is extensive retroperitoneal and pelvic adenopathy, some of the larger nodes include a left periaortic node measuring up to 3.2 cm (2/43) and a 2 cm aortocaval lymph node (2/35) there is distal abdominal aortic encasement by a extensive retroperitoneal adenopathy. Furthermore there is likely direct compression and/or direct nodal involvement of the inferior vena cava (2/40 with the lower IVC appearing expanded by hypoattenuating filling defects. Edematous changes are noted throughout the pelvis with venous engorgement and collateralization. Reproductive: The prostate and seminal vesicles are unremarkable. There is a varicocele seen in the left scrotum. External genitalia are incompletely included on this exam. Other: There is circumferential body wall edema from the level of the pelvis inferiorly more pronounced throughout the left lower extremity. Free fluid is seen in the deep pelvis, likely reactive or secondary to venous occlusion with developing collateralization. Musculoskeletal: No acute osseous abnormality or suspicious osseous lesion. Normal symmetric muscular enhancement. Review of the MIP images confirms the above findings. IMPRESSION: 1. Suboptimal opacification of the pulmonary arteries limits evaluation beyond the lobar level. No large central or lobar pulmonary arterial filling defects are identified. 2. Extensive retroperitoneal and pelvic adenopathy. There is likely direct compression of the lower IVC with the distal IVC and iliac veins appearing expanded by hypoattenuating thrombus. There is complete encasement of the abdominal aorta as well but without luminal narrowing. Findings are highly concerning for metastatic disease, particularly given patient history of testicular cancer. 3. Left scrotal varicocele. External genitalia are incompletely included on this exam. Consider further evaluation scrotal ultrasound given extensive retroperitoneal  adenopathy and patient's history of testicular cancer. 4. Free fluid in the deep pelvis is likely reactive or secondary to venous occlusion with developing collateralization. 5. Colonic diverticulosis without evidence of diverticulitis. 6. Mild bilateral gynecomastia, right slightly greater left. 7. 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 Critical Value/emergent results were called by telephone at the time of interpretation on 04/11/2019 at 1:33 am to [place] , who verbally acknowledged these results. Electronically Signed   By: Lovena Le M.D.   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 Patient has experienced minimal improvement since initiation of IV heparin, and as such request made for initiation of catheter directed thrombolysis for symptomatic purposes. EXAM:  1. ULTRASOUND GUIDANCE FOR VASCULAR ACCESS X2 2. FLUOROSCOPIC GUIDED PHARMACOLOGICAL AND MECHANICAL THROMBECTOMY WITH ANGIOJET DEVICE 3. BILATERAL LOWER EXTREMITY VENOGRAM AND PLACEMENT OF INFUSION CATHETERS COMPARISON:  CT chest, abdomen pelvis-04/11/2019; abdominal MRV - 04/13/2019 MEDICATIONS: Hydralazine 10 mg IV, administered near the completion of the procedure secondary to persistently elevated blood pressure. CONTRAST:  60 cc Omnipaque 300 ANESTHESIA/SEDATION: Moderate (conscious) sedation was employed during this procedure. A total of Versed 3 mg and Fentanyl 125 mcg was administered intravenously. Moderate Sedation Time: 67 minutes. The patient's level of consciousness and vital signs were monitored continuously by radiology nursing throughout the procedure under my direct supervision. FLUOROSCOPY TIME:  18 minutes, 18 seconds (979  mGy) COMPLICATIONS: None immediate. TECHNIQUE: Informed written consent was obtained from the patient after a discussion of the risks (including but not limited to bleeding, including nontarget bleeding given history of malignancy, infection, pulmonary embolism and necessity for venous angioplasty and/or stent placement), benefits and alternatives to treatment. Questions regarding the procedure were encouraged and answered. A timeout was performed prior to the initiation of the procedure. The patient was placed prone on the fluoroscopy table and the skin posterior to the bilateral knees was prepped and draped in the usual sterile fashion, and a sterile drape was applied covering the operative field. Maximum barrier sterile technique with sterile gowns and gloves were used for the procedure. Under direct ultrasound guidance, the left popliteal vein was access with a micropuncture kit after the overlying soft tissues were anesthetized with 1% lidocaine. An ultrasound image was saved for documentation purposed. This allowed for placement of a 8-French vascular sheath. A limited venogram was performed through the side arm of the vascular sheath. With the use of a regular glidewire, a Kumpe catheter was advanced through the left femoral, external iliac vein and through the left common iliac vein to the level of the IVC. Limited vena cavogram demonstrates complete occlusion of the distal aspect of the IVC extending to involve both left common iliac veins. Ultimately, a moderate to long segment apparently extrinsic malignant narrowing involving the mid/distal aspect of the IVC was successfully traversed with inferior venogram demonstrating patency of the more cranial infrarenal IVC. Next, with the use of a Angiojet device, approximately 250 cc of a solution of 10 mg of tPA mixed in 500 mg of saline was infused throughout the occluded segment of the IVC, left common and external iliac veins to the level of the left common  femoral vein. Attention was now paid towards access of the right lower extremity popliteal vein. After the overlying soft tissues were anesthetized with 1% lidocaine, the right popliteal vein was accessed micropuncture kit. Limited venogram was performed through the outer micro puncture sheath. This allowed for placement of a 6 French, 35 cm vascular sheath. Again, with the use of a regular glidewire, a Kumpe catheter was advanced beyond the obstructing caval lesion. Contrast injection confirmed appropriate positioning. Next, after approximately 20 minute tPA dwell, mechanical thrombectomy was performed with the Angiojet device both from the left lower extremity access as well as the right lower extremity access. Limited vena cavagram was performed via the right lower extremity access. Kumpe catheter was utilized for measurement purposes and ultimately a 90 cm/20-cm infusion catheter was placed from the right lower extremity access and a 90 cm/30 cm infusion catheter was placed from the left lower extremity access adequately covering the occluded segment of the bilateral venous systems as well as the caudal aspect of the IVC. Postprocedural spot fluoroscopic images  were obtained. Both popliteal vascular sheath were secured in place. Dressings were applied. Patient's was subjectively cold by the end of the procedure with associated elevation in his blood pressure and as such he was administered 10 mg of hydralazine and bear hugger warming device was applied. The patient otherwise tolerated the procedure well without immediate postprocedural complication. FINDINGS: Sonographic evaluation demonstrates patency of the bilateral popliteal veins. Contrast injection demonstrates patency of the bilateral popliteal and superficial femoral veins. There is occlusive thrombus extending from the left common femoral vein and the right common iliac vein through the mid/caudal aspect of the IVC secondary to a moderate to long  segment length presumably extrinsic malignant occlusion secondary to known adjacent bulky retroperitoneal lymphadenopathy. Following pharmacological and mechanical thrombectomy as detailed above, there is restored flow as demonstrated on limited right central pelvic venogram with large amount of residual thrombus. Following infusion catheter placement, both side ports traverse both occluded segments of the IVC and pelvic venous systems. IMPRESSION: 1. 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 2. Successful initiation of bilateral mechanical and pharmacologic thrombectomy with bilateral infusion catheters appropriately positioned. PLAN: The patient will be transferred to ICU for overnight lytic infusion and will return tomorrow for repeat venogram and potential intervention. Electronically Signed   By: Sandi Mariscal M.D.   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 Patient has experienced minimal improvement since initiation of IV heparin, and as such request made for initiation of catheter directed thrombolysis for symptomatic purposes. EXAM: 1. ULTRASOUND GUIDANCE FOR VASCULAR ACCESS X2 2. FLUOROSCOPIC GUIDED PHARMACOLOGICAL AND MECHANICAL THROMBECTOMY WITH ANGIOJET DEVICE 3. BILATERAL LOWER EXTREMITY VENOGRAM AND PLACEMENT OF INFUSION CATHETERS COMPARISON:  CT chest, abdomen pelvis-04/11/2019; abdominal MRV - 04/13/2019 MEDICATIONS: Hydralazine 10 mg IV, administered near the completion of the procedure secondary to persistently elevated blood pressure. CONTRAST:  60 cc Omnipaque 300 ANESTHESIA/SEDATION: Moderate (conscious) sedation was employed during this  procedure. A total of Versed 3 mg and Fentanyl 125 mcg was administered intravenously. Moderate Sedation Time: 67 minutes. The patient's level of consciousness and vital signs were monitored continuously by radiology nursing throughout the procedure under my direct supervision. FLUOROSCOPY TIME:  18 minutes, 18 seconds (768 mGy) COMPLICATIONS: None immediate. TECHNIQUE: Informed written consent was obtained from the patient after a discussion of the risks (including but not limited to bleeding, including nontarget bleeding given history of malignancy, infection, pulmonary embolism and necessity for venous angioplasty and/or stent placement), benefits and alternatives to treatment. Questions regarding the procedure were encouraged and answered. A timeout was performed prior to the initiation of the procedure. The patient was placed prone on the fluoroscopy table and the skin posterior to the bilateral knees was prepped and draped in the usual sterile fashion, and a sterile drape was applied covering the operative field. Maximum barrier sterile technique with sterile gowns and gloves were used for the procedure. Under direct ultrasound guidance, the left popliteal vein was access with a micropuncture kit after the overlying soft tissues were anesthetized with 1% lidocaine. An ultrasound image was saved for documentation purposed. This allowed for placement of a 8-French vascular sheath. A limited venogram was performed through the side arm of the vascular sheath. With the use of  a regular glidewire, a Kumpe catheter was advanced through the left femoral, external iliac vein and through the left common iliac vein to the level of the IVC. Limited vena cavogram demonstrates complete occlusion of the distal aspect of the IVC extending to involve both left common iliac veins. Ultimately, a moderate to long segment apparently extrinsic malignant narrowing involving the mid/distal aspect of the IVC was successfully traversed  with inferior venogram demonstrating patency of the more cranial infrarenal IVC. Next, with the use of a Angiojet device, approximately 250 cc of a solution of 10 mg of tPA mixed in 500 mg of saline was infused throughout the occluded segment of the IVC, left common and external iliac veins to the level of the left common femoral vein. Attention was now paid towards access of the right lower extremity popliteal vein. After the overlying soft tissues were anesthetized with 1% lidocaine, the right popliteal vein was accessed micropuncture kit. Limited venogram was performed through the outer micro puncture sheath. This allowed for placement of a 6 French, 35 cm vascular sheath. Again, with the use of a regular glidewire, a Kumpe catheter was advanced beyond the obstructing caval lesion. Contrast injection confirmed appropriate positioning. Next, after approximately 20 minute tPA dwell, mechanical thrombectomy was performed with the Angiojet device both from the left lower extremity access as well as the right lower extremity access. Limited vena cavagram was performed via the right lower extremity access. Kumpe catheter was utilized for measurement purposes and ultimately a 90 cm/20-cm infusion catheter was placed from the right lower extremity access and a 90 cm/30 cm infusion catheter was placed from the left lower extremity access adequately covering the occluded segment of the bilateral venous systems as well as the caudal aspect of the IVC. Postprocedural spot fluoroscopic images were obtained. Both popliteal vascular sheath were secured in place. Dressings were applied. Patient's was subjectively cold by the end of the procedure with associated elevation in his blood pressure and as such he was administered 10 mg of hydralazine and bear hugger warming device was applied. The patient otherwise tolerated the procedure well without immediate postprocedural complication. FINDINGS: Sonographic evaluation demonstrates  patency of the bilateral popliteal veins. Contrast injection demonstrates patency of the bilateral popliteal and superficial femoral veins. There is occlusive thrombus extending from the left common femoral vein and the right common iliac vein through the mid/caudal aspect of the IVC secondary to a moderate to long segment length presumably extrinsic malignant occlusion secondary to known adjacent bulky retroperitoneal lymphadenopathy. Following pharmacological and mechanical thrombectomy as detailed above, there is restored flow as demonstrated on limited right central pelvic venogram with large amount of residual thrombus. Following infusion catheter placement, both side ports traverse both occluded segments of the IVC and pelvic venous systems. IMPRESSION: 1. 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 2. Successful initiation of bilateral mechanical and pharmacologic thrombectomy with bilateral infusion catheters appropriately positioned. PLAN: The patient will be transferred to ICU for overnight lytic infusion and will return tomorrow for repeat venogram and potential intervention. Electronically Signed   By: Sandi Mariscal M.D.   On: 04/15/2019 17:04   US RENAL  Result Date: 04/22/2019 CLINICAL DATA:  Elevated serum creatinine level. EXAM: RENAL / URINARY TRACT ULTRASOUND COMPLETE COMPARISON:  April 17, 2019. FINDINGS: Right Kidney: Renal measurements: 13.9 x 6.4 x 6.3 cm = volume: 294 mL . Echogenicity within normal limits.  No mass or hydronephrosis visualized. Left Kidney: Renal measurements: 12.2 x 6.3 x 6.2 cm = volume: 246 mL. Echogenicity within normal limits. No mass or hydronephrosis visualized. Bladder: Appears normal for degree of bladder distention. Foley catheter is noted. Other: None. IMPRESSION: No significant renal abnormality is noted. Electronically Signed   By: Marijo Conception  M.D.   On: 04/22/2019 16:53   US RENAL  Result Date: 04/17/2019 CLINICAL DATA:  Urinary retention.  History of testicular carcinoma. EXAM: RENAL / URINARY TRACT ULTRASOUND COMPLETE COMPARISON:  CT scan 04/11/2019 FINDINGS: Right Kidney: Renal measurements: 13.5 x 5.5 x 6.6 cm = volume: 260.0 mL . Normal renal cortical thickness and echogenicity without focal lesions or hydronephrosis. Left Kidney: Renal measurements: 12.0 x 6.8 x 5.4 cm = volume: 229.4 mL. Normal renal cortical thickness and echogenicity without focal lesions or hydronephrosis. Bladder: Decompressed by Foley catheter. Other: None. IMPRESSION: Normal sonographic appearance of both kidneys.  No hydronephrosis. Bladder decompressed by a Foley catheter. Electronically Signed   By: Marijo Sanes M.D.   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  Patient has experienced minimal improvement since initiation of IV heparin, and as such request made for initiation of catheter directed thrombolysis for symptomatic purposes.  EXAM: 1. ULTRASOUND GUIDANCE FOR VASCULAR ACCESS X2 2. FLUOROSCOPIC GUIDED PHARMACOLOGICAL AND MECHANICAL THROMBECTOMY WITH ANGIOJET DEVICE 3. BILATERAL LOWER EXTREMITY VENOGRAM AND PLACEMENT OF INFUSION CATHETERS  COMPARISON:  CT chest, abdomen pelvis-04/11/2019; abdominal MRV - 04/13/2019  MEDICATIONS: Hydralazine 10 mg IV, administered near the completion of the procedure secondary to persistently elevated blood pressure.  CONTRAST:  60 cc Omnipaque 300  ANESTHESIA/SEDATION: Moderate (conscious) sedation was employed during this procedure. A total of Versed 3 mg and Fentanyl 125 mcg was administered intravenously.  Moderate Sedation Time: 67 minutes. The patient's level of  consciousness and vital signs were monitored continuously by radiology nursing throughout the procedure under my direct supervision.  FLUOROSCOPY TIME:  18 minutes, 18 seconds (426 mGy)  COMPLICATIONS: None immediate.  TECHNIQUE: Informed written consent was obtained from the patient after a discussion of the risks (including but not limited to bleeding, including nontarget bleeding given history of malignancy, infection, pulmonary embolism and necessity for venous angioplasty and/or stent placement), benefits and alternatives to treatment. Questions regarding the procedure were encouraged and answered. A timeout was performed prior to the initiation of the procedure.  The patient was placed prone on the fluoroscopy table and the skin posterior to the bilateral knees was prepped and draped in the usual sterile fashion, and a sterile drape was applied covering the operative field. Maximum barrier sterile technique with sterile gowns and gloves were used for the procedure.  Under direct ultrasound guidance, the left popliteal vein was access with a micropuncture kit after the overlying soft tissues were anesthetized with 1% lidocaine. An ultrasound image was saved for documentation purposed. This allowed for placement of a 8-French vascular sheath. A limited venogram was performed through the side arm of the vascular sheath.  With the use of a regular glidewire, a Kumpe catheter was advanced through the left femoral, external iliac vein and through the left common iliac vein to the level of the IVC.  Limited vena cavogram demonstrates complete occlusion of the distal aspect of the IVC extending to involve both  left common iliac veins.  Ultimately, a moderate to long segment apparently extrinsic malignant narrowing involving the mid/distal aspect of the IVC was successfully traversed with inferior venogram demonstrating patency of the more cranial infrarenal IVC.  Next, with the use of a Angiojet device,  approximately 250 cc of a solution of 10 mg of tPA mixed in 500 mg of saline was infused throughout the occluded segment of the IVC, left common and external iliac veins to the level of the left common femoral vein.  Attention was now paid towards access of the right lower extremity popliteal vein.  After the overlying soft tissues were anesthetized with 1% lidocaine, the right popliteal vein was accessed micropuncture kit. Limited venogram was performed through the outer micro puncture sheath. This allowed for placement of a 6 French, 35 cm vascular sheath.  Again, with the use of a regular glidewire, a Kumpe catheter was advanced beyond the obstructing caval lesion. Contrast injection confirmed appropriate positioning.  Next, after approximately 20 minute tPA dwell, mechanical thrombectomy was performed with the Angiojet device both from the left lower extremity access as well as the right lower extremity access.  Limited vena cavagram was performed via the right lower extremity access.  Kumpe catheter was utilized for measurement purposes and ultimately a 90 cm/20-cm infusion catheter was placed from the right lower extremity access and a 90 cm/30 cm infusion catheter was placed from the left lower extremity access adequately covering the occluded segment of the bilateral venous systems as well as the caudal aspect of the IVC.  Postprocedural spot fluoroscopic images were obtained.  Both popliteal vascular sheath were secured in place. Dressings were applied.  Patient's was subjectively cold by the end of the procedure with associated elevation in his blood pressure and as such he was administered 10 mg of hydralazine and bear hugger warming device was applied.  The patient otherwise tolerated the procedure well without immediate postprocedural complication.  FINDINGS: Sonographic evaluation demonstrates patency of the bilateral popliteal veins.  Contrast injection demonstrates patency of the  bilateral popliteal and superficial femoral veins.  There is occlusive thrombus extending from the left common femoral vein and the right common iliac vein through the mid/caudal aspect of the IVC secondary to a moderate to long segment length presumably extrinsic malignant occlusion secondary to known adjacent bulky retroperitoneal lymphadenopathy.  Following pharmacological and mechanical thrombectomy as detailed above, there is restored flow as demonstrated on limited right central pelvic venogram with large amount of residual thrombus.  Following infusion catheter placement, both side ports traverse both occluded segments of the IVC and pelvic venous systems.  IMPRESSION: 1. 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 2. Successful initiation of bilateral mechanical and pharmacologic thrombectomy with bilateral infusion catheters appropriately positioned.  PLAN: The patient will be transferred to ICU for overnight lytic infusion and will return tomorrow for repeat venogram and potential intervention.   Electronically Signed   By: Sandi Mariscal M.D.   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  Patient was initiated bilateral pelvic/IVC catheter directed thrombolysis day prior (04/15/2019), and returns today following 24 hours of tPA infusion.  EXAM: 1. IR THROMB F/U EVAL ART/VEN FINAL DAY 2.  FLUOROSCOPIC GUIDED ANGIOPLASTY AND STENT PLACEMENT OF THE IVC  COMPARISON:  Initiation of bilateral catheter directed pelvic venous and IVC thrombolysis - 04/15/2019; CT abdomen pelvis - 04/11/2019  MEDICATIONS: Heparin 7000 units  CONTRAST:  85 cc  Omnipaque 300  ANESTHESIA/SEDATION: Moderate (conscious) sedation was employed during this procedure. A total of Versed 2 mg and Dilaudid 3 mg was administered intravenously.  Moderate Sedation Time: 128 minutes. The patient's level of consciousness and vital signs were monitored continuously by radiology nursing throughout the procedure under my direct supervision.  FLUOROSCOPY TIME:  14 minutes, 12 seconds (734.1 mGy)  COMPLICATIONS: None immediate.  TECHNIQUE: Informed written consent was obtained from the patient after a discussion of the risks, benefits and alternatives to treatment. Questions regarding the procedure were encouraged and answered. A timeout was performed prior to the initiation of the procedure.  The patient was placed prone on the fluoroscopy table and the skin posterior to the bilateral knees as well as the external portion the existing bilateral vascular sheaths and infusion catheters was prepped and draped in usual sterile fashion. The surrounding subcutaneous tissues about both popliteal approach vascular sheath was anesthetized with 1% lidocaine with epinephrine.  Next, post lysis venograms were performed from the bilateral infusion catheters following removal of the bilateral infusion wires.  Images were reviewed and the decision was made to proceed with additional mechanical thrombectomy.  With the use of a vertebral catheter exchange length stiff Glidewires were advanced from both existing popliteal vascular sheaths superior to the nearly obstructing caval lesion. Contrast injection confirmed appropriate positioning. Stiff glide wires were advanced into the left subclavian vein.  4000 units of heparin was administered intravenously.  Next, beginning with the left lower extremity, the existing 8 French vascular sheath was exchanged over the glidewire for a 13 [place] vascular sheath.  The 16 mm diameter Clottreiever Inari mechanical thrombectomy device was then utilized to  perform 3 passes from the left popliteal approach from at and just below level of the obstructing caval lesion. Post thrombectomy images were obtained from the left lower extremity.  Next, the existing right lower extremity 6 French popliteal vein vascular sheath was exchanged over a guidewire for a 13 French vascular sheath. Again, from the right lower extremity approach, the 16 mm diameter Clottreiever Inari mechanical thrombectomy device was then utilized to perform 3 passes from the level at and just below the obstructing caval lesion. Post thrombectomy images were obtained from the right lower extremity.  An ACT level was obtained and an additional 3000 units of heparin was administered intravenously.  The long segment eccentric irregular narrowing/subtotal occlusion of the IVC was subsequently balloon angioplastied at multiple stations with a 14 mm x 4 cm Atlas balloon. Despite adequate balloon apposition, post angioplasty venogram images demonstrate a recurrent hemodynamically significant narrowing.  As such, a 20 mm x 60 mm Venovo venous stent was deployed across the superior and mid aspect of the caval lesion and was subsequently balloon angioplastied at multiple stations initially with a 16 mm x 4 cm atlas balloon and ultimately with an 18 mm x 4 cm balloon.  Post stent deployment venogram images were obtained however there is felt to be incomplete coverage of the distal aspect of the malignant narrowing. As such, an additional 20 mm x 40 mm Venovo venous stent was deployed overlapping the mid/caudal aspect of venous stent and adequately covering the caudal aspect caval lesion.  Both overlapping venous stents were subsequently balloon angioplastied at multiple stations with the  18 mm atlas balloon and completion venogram images were obtained from both lower extremities. Images were reviewed and the procedure was terminated.  All wires, catheters and sheaths were removed from the bilateral popliteal  vein accesses and hemostasis was achieved with manual compression. Dressings applied. The patient tolerated the procedure well without immediate postprocedural complication.  FINDINGS: Initial post lysis venogram performed from the left lower extremity infusion catheter demonstrates restoration of flow through the left pelvis with moderate amount residual thrombus primarily within the peripheral aspect of the left external iliac vein as well as the left common and central aspect of the left superficial femoral veins. There is passage of contrast beyond the nearly obstructing lesion involving mid/caudal aspect of the IVC.  Initial post lysis venogram performed from the right lower extremity demonstrates persistent occlusion at the level of the right common iliac vein.  Following 3 passes from each lower extremity with the 16 mm diameter Clottreiever Inari mechanical thrombectomy device, nearly all residual clot burden within the pelvis was removed.  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 This lesion was again noted to be significantly caudal to the confluence of the bilateral renal veins.  As such, nearly obstructing caval lesion was treated with overlapping stent deployment of 20 mm diameter Venovo venous stents which were subsequently balloon angioplastied to 18 mm diameter.  Completion images demonstrate a technically excellent result with restored brisk flow through the bilateral pelvic systems and the IVC.  There is a mild residual narrowing at the level of the mid/caudal aspect of the IVC, though not felt to result in a residual hemodynamically significant stenosis.  No evidence of complication. Specifically, no evidence of contrast extravasation or vessel  dissection.  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  PLAN: - Maintain IV heparin drip until patient is successfully converted to long-term anticoagulant.  - Recommend maintaining SCD devices while in bed.   Electronically Signed   By: Sandi Mariscal M.D.   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 Patient has experienced minimal improvement since initiation of IV heparin, and as such request made for initiation of catheter directed thrombolysis for symptomatic purposes. EXAM: 1. ULTRASOUND GUIDANCE FOR VASCULAR ACCESS X2 2. FLUOROSCOPIC GUIDED PHARMACOLOGICAL AND MECHANICAL THROMBECTOMY WITH ANGIOJET DEVICE 3. BILATERAL LOWER EXTREMITY VENOGRAM AND PLACEMENT OF INFUSION CATHETERS COMPARISON:  CT chest, abdomen pelvis-04/11/2019; abdominal MRV - 04/13/2019 MEDICATIONS: Hydralazine 10 mg IV, administered near the completion of the procedure secondary to persistently elevated blood pressure. CONTRAST:  60 cc Omnipaque 300 ANESTHESIA/SEDATION: Moderate (conscious) sedation was employed during this procedure. A total of Versed 3 mg and Fentanyl 125 mcg was administered intravenously. Moderate Sedation Time: 67 minutes. The patient's level of consciousness and vital signs were monitored continuously by radiology nursing throughout the procedure under my direct supervision. FLUOROSCOPY TIME:  18 minutes, 18 seconds (810 mGy) COMPLICATIONS: None immediate. TECHNIQUE: Informed written consent was obtained from the patient after a discussion of the risks (including but not limited to bleeding, including  nontarget bleeding given history of malignancy, infection,  pulmonary embolism and necessity for venous angioplasty and/or stent placement), benefits and alternatives to treatment. Questions regarding the procedure were encouraged and answered. A timeout was performed prior to the initiation of the procedure. The patient was placed prone on the fluoroscopy table and the skin posterior to the bilateral knees was prepped and draped in the usual sterile fashion, and a sterile drape was applied covering the operative field. Maximum barrier sterile technique with sterile gowns and gloves were used for the procedure. Under direct ultrasound guidance, the left popliteal vein was access with a micropuncture kit after the overlying soft tissues were anesthetized with 1% lidocaine. An ultrasound image was saved for documentation purposed. This allowed for placement of a 8-French vascular sheath. A limited venogram was performed through the side arm of the vascular sheath. With the use of a regular glidewire, a Kumpe catheter was advanced through the left femoral, external iliac vein and through the left common iliac vein to the level of the IVC. Limited vena cavogram demonstrates complete occlusion of the distal aspect of the IVC extending to involve both left common iliac veins. Ultimately, a moderate to long segment apparently extrinsic malignant narrowing involving the mid/distal aspect of the IVC was successfully traversed with inferior venogram demonstrating patency of the more cranial infrarenal IVC. Next, with the use of a Angiojet device, approximately 250 cc of a solution of 10 mg of tPA mixed in 500 mg of saline was infused throughout the occluded segment of the IVC, left common and external iliac veins to the level of the left common femoral vein. Attention was now paid towards access of the right lower extremity popliteal vein. After the overlying soft tissues were anesthetized with 1% lidocaine, the right  popliteal vein was accessed micropuncture kit. Limited venogram was performed through the outer micro puncture sheath. This allowed for placement of a 6 French, 35 cm vascular sheath. Again, with the use of a regular glidewire, a Kumpe catheter was advanced beyond the obstructing caval lesion. Contrast injection confirmed appropriate positioning. Next, after approximately 20 minute tPA dwell, mechanical thrombectomy was performed with the Angiojet device both from the left lower extremity access as well as the right lower extremity access. Limited vena cavagram was performed via the right lower extremity access. Kumpe catheter was utilized for measurement purposes and ultimately a 90 cm/20-cm infusion catheter was placed from the right lower extremity access and a 90 cm/30 cm infusion catheter was placed from the left lower extremity access adequately covering the occluded segment of the bilateral venous systems as well as the caudal aspect of the IVC. Postprocedural spot fluoroscopic images were obtained. Both popliteal vascular sheath were secured in place. Dressings were applied. Patient's was subjectively cold by the end of the procedure with associated elevation in his blood pressure and as such he was administered 10 mg of hydralazine and bear hugger warming device was applied. The patient otherwise tolerated the procedure well without immediate postprocedural complication. FINDINGS: Sonographic evaluation demonstrates patency of the bilateral popliteal veins. Contrast injection demonstrates patency of the bilateral popliteal and superficial femoral veins. There is occlusive thrombus extending from the left common femoral vein and the right common iliac vein through the mid/caudal aspect of the IVC secondary to a moderate to long segment length presumably extrinsic malignant occlusion secondary to known adjacent bulky retroperitoneal lymphadenopathy. Following pharmacological and mechanical thrombectomy as  detailed above, there is restored flow as demonstrated on limited right central pelvic venogram with large amount of residual thrombus. Following  infusion catheter placement, both side ports traverse both occluded segments of the IVC and pelvic venous systems. IMPRESSION: 1. 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 2. Successful initiation of bilateral mechanical and pharmacologic thrombectomy with bilateral infusion catheters appropriately positioned. PLAN: The patient will be transferred to ICU for overnight lytic infusion and will return tomorrow for repeat venogram and potential intervention. Electronically Signed   By: Sandi Mariscal M.D.   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 Patient has experienced minimal improvement since initiation of IV heparin, and as such request made for initiation of catheter directed thrombolysis for symptomatic purposes. EXAM: 1. ULTRASOUND GUIDANCE FOR VASCULAR ACCESS X2 2. FLUOROSCOPIC GUIDED PHARMACOLOGICAL AND MECHANICAL THROMBECTOMY WITH ANGIOJET DEVICE 3. BILATERAL LOWER EXTREMITY VENOGRAM AND PLACEMENT OF INFUSION CATHETERS COMPARISON:  CT chest, abdomen pelvis-04/11/2019; abdominal MRV - 04/13/2019 MEDICATIONS: Hydralazine 10 mg IV, administered near the completion of the procedure secondary to persistently elevated blood pressure. CONTRAST:  60 cc Omnipaque 300 ANESTHESIA/SEDATION: Moderate (conscious) sedation was employed during this procedure. A total of Versed 3 mg and Fentanyl 125 mcg was administered intravenously. Moderate Sedation Time: 67 minutes. The patient's level of consciousness and vital signs  were monitored continuously by radiology nursing throughout the procedure under my direct supervision. FLUOROSCOPY TIME:  18 minutes, 18 seconds (268 mGy) COMPLICATIONS: None immediate. TECHNIQUE: Informed written consent was obtained from the patient after a discussion of the risks (including but not limited to bleeding, including nontarget bleeding given history of malignancy, infection, pulmonary embolism and necessity for venous angioplasty and/or stent placement), benefits and alternatives to treatment. Questions regarding the procedure were encouraged and answered. A timeout was performed prior to the initiation of the procedure. The patient was placed prone on the fluoroscopy table and the skin posterior to the bilateral knees was prepped and draped in the usual sterile fashion, and a sterile drape was applied covering the operative field. Maximum barrier sterile technique with sterile gowns and gloves were used for the procedure. Under direct ultrasound guidance, the left popliteal vein was access with a micropuncture kit after the overlying soft tissues were anesthetized with 1% lidocaine. An ultrasound image was saved for documentation purposed. This allowed for placement of a 8-French vascular sheath. A limited venogram was performed through the side arm of the vascular sheath. With the use of a regular glidewire, a Kumpe catheter was advanced through the left femoral, external iliac vein and through the left common iliac vein to the level of the IVC. Limited vena cavogram demonstrates complete occlusion of the distal aspect of the IVC extending to involve both left common iliac veins. Ultimately, a moderate to long segment apparently extrinsic malignant narrowing involving the mid/distal aspect of the IVC was successfully traversed with inferior venogram demonstrating patency of the more cranial infrarenal IVC. Next, with the use of a Angiojet device, approximately 250 cc of a solution of 10 mg of tPA  mixed in 500 mg of saline was infused throughout the occluded segment of the IVC, left common and external iliac veins to the level of the left common femoral vein. Attention was now paid towards access of the right lower extremity popliteal vein. After the overlying soft tissues  were anesthetized with 1% lidocaine, the right popliteal vein was accessed micropuncture kit. Limited venogram was performed through the outer micro puncture sheath. This allowed for placement of a 6 French, 35 cm vascular sheath. Again, with the use of a regular glidewire, a Kumpe catheter was advanced beyond the obstructing caval lesion. Contrast injection confirmed appropriate positioning. Next, after approximately 20 minute tPA dwell, mechanical thrombectomy was performed with the Angiojet device both from the left lower extremity access as well as the right lower extremity access. Limited vena cavagram was performed via the right lower extremity access. Kumpe catheter was utilized for measurement purposes and ultimately a 90 cm/20-cm infusion catheter was placed from the right lower extremity access and a 90 cm/30 cm infusion catheter was placed from the left lower extremity access adequately covering the occluded segment of the bilateral venous systems as well as the caudal aspect of the IVC. Postprocedural spot fluoroscopic images were obtained. Both popliteal vascular sheath were secured in place. Dressings were applied. Patient's was subjectively cold by the end of the procedure with associated elevation in his blood pressure and as such he was administered 10 mg of hydralazine and bear hugger warming device was applied. The patient otherwise tolerated the procedure well without immediate postprocedural complication. FINDINGS: Sonographic evaluation demonstrates patency of the bilateral popliteal veins. Contrast injection demonstrates patency of the bilateral popliteal and superficial femoral veins. There is occlusive thrombus  extending from the left common femoral vein and the right common iliac vein through the mid/caudal aspect of the IVC secondary to a moderate to long segment length presumably extrinsic malignant occlusion secondary to known adjacent bulky retroperitoneal lymphadenopathy. Following pharmacological and mechanical thrombectomy as detailed above, there is restored flow as demonstrated on limited right central pelvic venogram with large amount of residual thrombus. Following infusion catheter placement, both side ports traverse both occluded segments of the IVC and pelvic venous systems. IMPRESSION: 1. 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 2. Successful initiation of bilateral mechanical and pharmacologic thrombectomy with bilateral infusion catheters appropriately positioned. PLAN: The patient will be transferred to ICU for overnight lytic infusion and will return tomorrow for repeat venogram and potential intervention. Electronically Signed   By: Sandi Mariscal M.D.   On: 04/15/2019 17:04   CT BIOPSY  Result Date: 04/12/2019 INDICATION: 39 year old male with a history of testicular cancer previously treated. He presents with new retroperitoneal adenopathy EXAM: CT BIOPSY MEDICATIONS: None. ANESTHESIA/SEDATION: Moderate (conscious) sedation was employed during this procedure. A total of Versed 2.0 mg and Fentanyl 100 mcg was administered intravenously. Moderate Sedation Time: 14 minutes. The patient's level of consciousness and vital signs were monitored continuously by radiology nursing throughout the procedure under my direct supervision. FLUOROSCOPY TIME:  CT COMPLICATIONS: None PROCEDURE: Informed written consent was obtained from the patient after a thorough discussion of the procedural risks, benefits and alternatives. All questions were addressed. Maximal Sterile Barrier Technique was utilized  including caps, mask, sterile gowns, sterile gloves, sterile drape, hand hygiene and skin antiseptic. A timeout was performed prior to the initiation of the procedure. Patient positioned prone position on CT gantry table. Scout CT acquired for planning purposes. The patient was then prepped and draped in the usual sterile fashion. 1% lidocaine was used for local anesthesia. Introducer needle was then placed targeting the left-sided retroperitoneal lymphadenopathy. Once we confirmed needle tip position, multiple 18 gauge core biopsy were  acquired. Specimen placed in the saline as S fresh specimen. Needle was removed and a final CT was acquired. Patient tolerated the procedure well and remained hemodynamically stable throughout. No complications were encountered and no significant blood loss. IMPRESSION: Status post CT-guided biopsy of left retroperitoneal lymphadenopathy. Signed, Dulcy Fanny. Dellia Nims, RPVI [facility] Electronically Signed   By: [name] D.O.   On: 04/12/2019 12:56   DG Chest Port 1 View  Result Date: 04/18/2019 CLINICAL DATA:  Fever, testicular cancer EXAM: PORTABLE CHEST 1 VIEW COMPARISON:  None. FINDINGS: Normal heart size. Normal mediastinal contour. No pneumothorax. No pleural effusion. Slightly low lung volumes. Minimal streaky opacities at both lung bases. No pulmonary edema. IMPRESSION: Slightly low lung volumes. Minimal streaky opacities at the lung bases, favoring minimal atelectasis. No consolidative airspace disease to suggest a pneumonia. Electronically Signed   By: Ilona Sorrel M.D.   On: 04/18/2019 15:41   DG Abd Portable 1V  Result Date: 04/18/2019 CLINICAL DATA:  Abdominal pain and distension EXAM: PORTABLE ABDOMEN - 1 VIEW COMPARISON:  04/11/2019 FINDINGS: Supine frontal view of the abdomen and pelvis excludes the hemidiaphragms, right flank, and lower pelvis by collimation. There is diffuse gaseous distention of  the colon, likely reflecting postoperative ileus. Stable position of the IVC stent. No masses or abnormal calcifications. IMPRESSION: 1. Distended gas-filled colon consistent with postprocedural ileus. 2. IVC stent as above Electronically Signed   By: Randa Ngo M.D.   On: 04/18/2019 08:41   ECHOCARDIOGRAM COMPLETE  Result Date: 04/19/2019   ECHOCARDIOGRAM REPORT   Patient Name:   Timothy [name] Date of Exam: 04/19/2019 Medical Rec #:  [id number]      Height:       75.0 in Accession #:    [id number]     Weight:       216.9 lb Date of Birth:  1980-08-19       BSA:          2.27 m Patient Age:    38 years       BP:           132/83 mmHg Patient Gender: M              HR:           125 bpm. Exam Location:  Inpatient Procedure: 2D Echo, Color Doppler and Cardiac Doppler                          MODIFIED REPORT: This report was modified by Cherlynn Kaiser MD on 04/19/2019 due to.  Indications:     Fever  History:         Patient has no prior history of Echocardiogram examinations.                  Testicular cancer; Signs/Symptoms:Fever.  Sonographer:     Johny Chess Referring Phys:  [id number] Noland Fordyce MATHEWS Diagnosing Phys: Cherlynn Kaiser MD  Sonographer Comments: Suboptimal apical window. IMPRESSIONS  1. Left ventricular ejection fraction, by visual estimation, is 60 to 65%. The left ventricle has normal function. Mildly increased left ventricular posterior wall thickness. There is mildly increased left ventricular hypertrophy.  2. Global right ventricle has normal systolic function.The right ventricular size is normal. Right vetricular wall thickness was not assessed.  3. The mitral valve is normal in structure. No evidence of mitral valve regurgitation. No evidence of mitral stenosis.  4. The tricuspid  valve is normal in structure. Tricuspid valve regurgitation is not demonstrated.  5. The aortic valve is normal in structure. Aortic valve regurgitation is not visualized. No evidence of aortic valve  sclerosis or stenosis.  6. The inferior vena cava is normal in size with greater than 50% respiratory variability, suggesting right atrial pressure of 3 mmHg.  7. No evidence of valvular vegetations on this transthoracic echocardiogram, however acoustic windows are suboptimal. FINDINGS  Left Ventricle: Left ventricular ejection fraction, by visual estimation, is 60 to 65%. The left ventricle has normal function. The left ventricle is not well visualized. Mildly increased left ventricular posterior wall thickness. There is mildly increased left ventricular hypertrophy. Left ventricular diastolic parameters were normal. Normal left atrial pressure. Right Ventricle: The right ventricular size is normal. Right vetricular wall thickness was not assessed. Global RV systolic function is has normal systolic function. Left Atrium: Left atrial size was normal in size. Right Atrium: Right atrial size was normal in size Pericardium: There is no evidence of pericardial effusion. Mitral Valve: The mitral valve is normal in structure. No evidence of mitral valve regurgitation. No evidence of mitral valve stenosis by observation. Tricuspid Valve: The tricuspid valve is normal in structure. Tricuspid valve regurgitation is not demonstrated. Aortic Valve: The aortic valve is normal in structure. Aortic valve regurgitation is not visualized. The aortic valve is structurally normal, with no evidence of sclerosis or stenosis. Pulmonic Valve: The pulmonic valve was normal in structure. Pulmonic valve regurgitation is not visualized. Pulmonic regurgitation is not visualized. Aorta: The aortic root is normal in size and structure. Venous: The inferior vena cava is normal in size with greater than 50% respiratory variability, suggesting right atrial pressure of 3 mmHg. IAS/Shunts: The interatrial septum was not well visualized. Additional Comments: No evidence of valvular vegetations on this transthoracic echocardiogram. Would recommend a  transesophageal echocardiogram to exclude infective endocarditis if clinically indicated.  LEFT VENTRICLE PLAX 2D LVIDd:         5.00 cm  Diastology LVIDs:         3.10 cm  LV e' lateral: 14.10 cm/s LV PW:         1.10 cm  LV e' medial:  12.30 cm/s LV IVS:        0.80 cm LVOT diam:     2.40 cm LV SV:         80 ml LV SV Index:   35.02 LVOT Area:     4.52 cm  RIGHT VENTRICLE RV S prime:     27.10 cm/s TAPSE (M-mode): 2.8 cm LEFT ATRIUM           Index LA diam:      3.40 cm 1.50 cm/m LA Vol (A4C): 38.8 ml 17.08 ml/m  AORTIC VALVE LVOT Vmax:   105.00 cm/s LVOT Vmean:  74.200 cm/s LVOT VTI:    0.167 m  AORTA Ao Root diam: 3.60 cm  SHUNTS Systemic VTI:  0.17 m Systemic Diam: 2.40 cm  Cherlynn Kaiser MD Electronically signed by Cherlynn Kaiser MD Signature Date/Time: 04/19/2019/7:59:29 PM    Final (Updated)    IR TRANSCATH PLC STENT 1ST ART NOT LE CV CAR VERT CAR  Result Date: 04/16/2019 INDICATION: History of newly diagnosed recurrent testicular cancer, now with bulky 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 Patient was initiated bilateral pelvic/IVC catheter directed thrombolysis day prior (04/15/2019), and returns today following 24 hours of tPA infusion. EXAM: 1. IR THROMB  F/U EVAL ART/VEN FINAL DAY 2. FLUOROSCOPIC GUIDED ANGIOPLASTY AND STENT PLACEMENT OF THE IVC COMPARISON:  Initiation of bilateral catheter directed pelvic venous and IVC thrombolysis - 04/15/2019; CT abdomen pelvis - 04/11/2019 MEDICATIONS: Heparin 7000 units CONTRAST:  85 cc Omnipaque 300 ANESTHESIA/SEDATION: Moderate (conscious) sedation was employed during this procedure. A total of Versed 2 mg and Dilaudid 3 mg was administered intravenously. Moderate Sedation Time: 128 minutes. The patient's level of consciousness and vital signs were monitored continuously by radiology nursing throughout the procedure under my direct supervision. FLUOROSCOPY TIME:  14  minutes, 12 seconds (078.6 mGy) COMPLICATIONS: None immediate. TECHNIQUE: Informed written consent was obtained from the patient after a discussion of the risks, benefits and alternatives to treatment. Questions regarding the procedure were encouraged and answered. A timeout was performed prior to the initiation of the procedure. The patient was placed prone on the fluoroscopy table and the skin posterior to the bilateral knees as well as the external portion the existing bilateral vascular sheaths and infusion catheters was prepped and draped in usual sterile fashion. The surrounding subcutaneous tissues about both popliteal approach vascular sheath was anesthetized with 1% lidocaine with epinephrine. Next, post lysis venograms were performed from the bilateral infusion catheters following removal of the bilateral infusion wires. Images were reviewed and the decision was made to proceed with additional mechanical thrombectomy. With the use of a vertebral catheter exchange length stiff Glidewires were advanced from both existing popliteal vascular sheaths superior to the nearly obstructing caval lesion. Contrast injection confirmed appropriate positioning. Stiff glide wires were advanced into the left subclavian vein. 4000 units of heparin was administered intravenously. Next, beginning with the left lower extremity, the existing 8 French vascular sheath was exchanged over the glidewire for a 13 [place] vascular sheath. The 16 mm diameter Clottreiever Inari mechanical thrombectomy device was then utilized to perform 3 passes from the left popliteal approach from at and just below level of the obstructing caval lesion. Post thrombectomy images were obtained from the left lower extremity. Next, the existing right lower extremity 6 French popliteal vein vascular sheath was exchanged over a guidewire for a 13 French vascular sheath. Again, from the right lower extremity approach, the 16 mm diameter Clottreiever Inari  mechanical thrombectomy device was then utilized to perform 3 passes from the level at and just below the obstructing caval lesion. Post thrombectomy images were obtained from the right lower extremity. An ACT level was obtained and an additional 3000 units of heparin was administered intravenously. The long segment eccentric irregular narrowing/subtotal occlusion of the IVC was subsequently balloon angioplastied at multiple stations with a 14 mm x 4 cm Atlas balloon. Despite adequate balloon apposition, post angioplasty venogram images demonstrate a recurrent hemodynamically significant narrowing. As such, a 20 mm x 60 mm Venovo venous stent was deployed across the superior and mid aspect of the caval lesion and was subsequently balloon angioplastied at multiple stations initially with a 16 mm x 4 cm atlas balloon and ultimately with an 18 mm x 4 cm balloon. Post stent deployment venogram images were obtained however there is felt to be incomplete coverage of the distal aspect of the malignant narrowing. As such, an additional 20 mm x 40 mm Venovo venous stent was deployed overlapping the mid/caudal aspect of venous stent and adequately covering the caudal aspect caval lesion. Both overlapping venous stents were subsequently balloon angioplastied at multiple stations with the 18 mm atlas balloon and completion venogram images were obtained from both lower extremities. Images  were reviewed and the procedure was terminated. All wires, catheters and sheaths were removed from the bilateral popliteal vein accesses and hemostasis was achieved with manual compression. Dressings applied. The patient tolerated the procedure well without immediate postprocedural complication. FINDINGS: Initial post lysis venogram performed from the left lower extremity infusion catheter demonstrates restoration of flow through the left pelvis with moderate amount residual thrombus primarily within the peripheral aspect of the left external  iliac vein as well as the left common and central aspect of the left superficial femoral veins. There is passage of contrast beyond the nearly obstructing lesion involving mid/caudal aspect of the IVC. Initial post lysis venogram performed from the right lower extremity demonstrates persistent occlusion at the level of the right common iliac vein. Following 3 passes from each lower extremity with the 16 mm diameter Clottreiever Inari mechanical thrombectomy device, nearly all residual clot burden within the pelvis was removed. 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 This lesion was again noted to be significantly caudal to the confluence of the bilateral renal veins. As such, nearly obstructing caval lesion was treated with overlapping stent deployment of 20 mm diameter Venovo venous stents which were subsequently balloon angioplastied to 18 mm diameter. Completion images demonstrate a technically excellent result with restored brisk flow through the bilateral pelvic systems and the IVC. There is a mild residual narrowing at the level of the mid/caudal aspect of the IVC, though not felt to result in a residual hemodynamically significant stenosis. No evidence of complication. Specifically, no evidence of contrast extravasation or vessel dissection. 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 PLAN: - Maintain IV heparin drip until patient is successfully converted to long-term anticoagulant. - Recommend maintaining SCD devices while in bed. Electronically Signed   By: Sandi Mariscal M.D.   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 EXAM: MRI ABDOMEN WITHOUT AND WITH CONTRAST TECHNIQUE: Multiplanar multisequence MR imaging of the abdomen was performed both before and after the administration of intravenous contrast. CONTRAST:  57m GADAVIST GADOBUTROL 1 MMOL/ML IV SOLN COMPARISON:  CT 04/11/2019 FINDINGS: Vascular: Arterial: Unremarkable course caliber and contour of the visualized abdominal aorta. Unremarkable course caliber and contour of the bilateral iliac arteries and proximal femoral arteries. Bilateral hypogastric arteries are patent. Venous: Right iliac/femoral system: Flow signal is maintained within the visualized proximal femoral veins. Flow signal maintained into the external iliac vein. Absent flow signal with evidence of thrombus involving the proximal right common iliac vein to the confluence and into the IVC. Expansion of the right common iliac vein. Multiple pelvic collateral vessels. Left iliac/femoral system: Absent flow signal through the length of the left iliac venous system extending through the femoral vein. Expansion of the left iliac and femoral venous system. Multiple pelvic collateral veins. IVC: The IVC is not well evaluated at the level of the adenopathy. Absent flow signal above the confluence of the iliac veins compatible with ilio caval thrombus. The juxtarenal IVC is patent with maintained flow signal and maintain flow signal of the bilateral renal  veins. Nonvascular: Unremarkable musculoskeletal structures. Partially distended urinary bladder. Unremarkable appearance of the visualized kidneys and GI system. Edema of the lower abdominal wall/pelvic body wall, as well as edema of the left proximal lower extremity. IMPRESSION: The MR confirms ilio caval  thrombus involving the lower IVC, bilateral common iliac vein, and the left external iliac vein and proximal left femoral system. Pathologic lymphadenopathy of the retroperitoneum, similar to comparison CT, contributing to external compression of the infrarenal IVC. The current MR is nondiagnostic to confirm or rule out tumor thrombus. Lower abdominal/pelvic wall edema including edema of the proximal left lower extremity. Signed, Dulcy Fanny. Dellia Nims, RPVI [facility] Electronically Signed   By: [name] D.O.   On: 04/13/2019 14:14   VAS [place] LOWER EXTREMITY VENOUS (DVT)  Result Date: 04/11/2019  Lower Venous Study Indications: Swelling.  Risk Factors: None identified. Anticoagulation: Heparin. Limitations: Poor ultrasound/tissue interface and bowel gas. Comparison Study: No prior studies. Performing Technologist: Oliver Hum RVT  Examination Guidelines: A complete evaluation includes B-mode imaging, spectral Doppler, color Doppler, and power Doppler as needed of all accessible portions of each vessel. Bilateral testing is considered an integral part of a complete examination. Limited examinations for reoccurring indications may be performed as noted.  +---------+---------------+---------+-----------+----------+--------------+ RIGHT    CompressibilityPhasicitySpontaneityPropertiesThrombus Aging +---------+---------------+---------+-----------+----------+--------------+ CFV      Full           Yes      Yes                                 +---------+---------------+---------+-----------+----------+--------------+ SFJ      Full                                                        +---------+---------------+---------+-----------+----------+--------------+ FV Prox  Full                                                        +---------+---------------+---------+-----------+----------+--------------+ FV Mid   Full                                                         +---------+---------------+---------+-----------+----------+--------------+ FV DistalFull                                                        +---------+---------------+---------+-----------+----------+--------------+ PFV      Full                                                        +---------+---------------+---------+-----------+----------+--------------+ POP      Full  Yes      Yes                                 +---------+---------------+---------+-----------+----------+--------------+ PTV      Full                                                        +---------+---------------+---------+-----------+----------+--------------+ PERO     Full                                                        +---------+---------------+---------+-----------+----------+--------------+   +---------+---------------+---------+-----------+----------+--------------+ LEFT     CompressibilityPhasicitySpontaneityPropertiesThrombus Aging +---------+---------------+---------+-----------+----------+--------------+ CFV      None           No       No                   Acute          +---------+---------------+---------+-----------+----------+--------------+ SFJ      None                                         Acute          +---------+---------------+---------+-----------+----------+--------------+ FV Prox  None           No       No                   Acute          +---------+---------------+---------+-----------+----------+--------------+ FV Mid   None           No       No                   Acute          +---------+---------------+---------+-----------+----------+--------------+ FV DistalNone           No       No                   Acute          +---------+---------------+---------+-----------+----------+--------------+ POP      Partial        No       No                   Acute           +---------+---------------+---------+-----------+----------+--------------+ PTV      Full                                                        +---------+---------------+---------+-----------+----------+--------------+ PERO     None  Acute          +---------+---------------+---------+-----------+----------+--------------+ EIV      None           No       No                   Acute          +---------+---------------+---------+-----------+----------+--------------+ Unable to visualize CIV, or IVC due to bowel gas.    Summary: Right: There is no evidence of deep vein thrombosis in the lower extremity. No cystic structure found in the popliteal fossa. Left: Findings consistent with acute deep vein thrombosis involving the left external iliac vein, common femoral vein, left femoral vein, left popliteal vein, and left peroneal veins. No cystic structure found in the popliteal fossa. Unable to visualize CIV, or IVC due to bowel gas.  *See table(s) above for measurements and observations. Electronically signed by Deitra Mayo MD on 04/11/2019 at 4:09:39 PM.    Final    [place] EKG SITE RITE  Result Date: 04/21/2019 If Site Rite image not attached, placement could not be confirmed due to current cardiac rhythm.  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 Patient has experienced minimal improvement since initiation of IV heparin, and as such request made for initiation of catheter directed thrombolysis for symptomatic purposes. EXAM: 1. ULTRASOUND GUIDANCE FOR VASCULAR ACCESS X2 2. FLUOROSCOPIC GUIDED PHARMACOLOGICAL AND MECHANICAL THROMBECTOMY WITH ANGIOJET DEVICE 3. BILATERAL LOWER EXTREMITY VENOGRAM AND PLACEMENT OF  INFUSION CATHETERS COMPARISON:  CT chest, abdomen pelvis-04/11/2019; abdominal MRV - 04/13/2019 MEDICATIONS: Hydralazine 10 mg IV, administered near the completion of the procedure secondary to persistently elevated blood pressure. CONTRAST:  60 cc Omnipaque 300 ANESTHESIA/SEDATION: Moderate (conscious) sedation was employed during this procedure. A total of Versed 3 mg and Fentanyl 125 mcg was administered intravenously. Moderate Sedation Time: 67 minutes. The patient's level of consciousness and vital signs were monitored continuously by radiology nursing throughout the procedure under my direct supervision. FLUOROSCOPY TIME:  18 minutes, 18 seconds (630 mGy) COMPLICATIONS: None immediate. TECHNIQUE: Informed written consent was obtained from the patient after a discussion of the risks (including but not limited to bleeding, including nontarget bleeding given history of malignancy, infection, pulmonary embolism and necessity for venous angioplasty and/or stent placement), benefits and alternatives to treatment. Questions regarding the procedure were encouraged and answered. A timeout was performed prior to the initiation of the procedure. The patient was placed prone on the fluoroscopy table and the skin posterior to the bilateral knees was prepped and draped in the usual sterile fashion, and a sterile drape was applied covering the operative field. Maximum barrier sterile technique with sterile gowns and gloves were used for the procedure. Under direct ultrasound guidance, the left popliteal vein was access with a micropuncture kit after the overlying soft tissues were anesthetized with 1% lidocaine. An ultrasound image was saved for documentation purposed. This allowed for placement of a 8-French vascular sheath. A limited venogram was performed through the side arm of the vascular sheath. With the use of a regular glidewire, a Kumpe catheter was advanced through the left femoral, external iliac vein and through  the left common iliac vein to the level of the IVC. Limited vena cavogram demonstrates complete occlusion of the distal aspect of the IVC extending to involve both left common iliac  veins. Ultimately, a moderate to long segment apparently extrinsic malignant narrowing involving the mid/distal aspect of the IVC was successfully traversed with inferior venogram demonstrating patency of the more cranial infrarenal IVC. Next, with the use of a Angiojet device, approximately 250 cc of a solution of 10 mg of tPA mixed in 500 mg of saline was infused throughout the occluded segment of the IVC, left common and external iliac veins to the level of the left common femoral vein. Attention was now paid towards access of the right lower extremity popliteal vein. After the overlying soft tissues were anesthetized with 1% lidocaine, the right popliteal vein was accessed micropuncture kit. Limited venogram was performed through the outer micro puncture sheath. This allowed for placement of a 6 French, 35 cm vascular sheath. Again, with the use of a regular glidewire, a Kumpe catheter was advanced beyond the obstructing caval lesion. Contrast injection confirmed appropriate positioning. Next, after approximately 20 minute tPA dwell, mechanical thrombectomy was performed with the Angiojet device both from the left lower extremity access as well as the right lower extremity access. Limited vena cavagram was performed via the right lower extremity access. Kumpe catheter was utilized for measurement purposes and ultimately a 90 cm/20-cm infusion catheter was placed from the right lower extremity access and a 90 cm/30 cm infusion catheter was placed from the left lower extremity access adequately covering the occluded segment of the bilateral venous systems as well as the caudal aspect of the IVC. Postprocedural spot fluoroscopic images were obtained. Both popliteal vascular sheath were secured in place. Dressings were applied. Patient's  was subjectively cold by the end of the procedure with associated elevation in his blood pressure and as such he was administered 10 mg of hydralazine and bear hugger warming device was applied. The patient otherwise tolerated the procedure well without immediate postprocedural complication. FINDINGS: Sonographic evaluation demonstrates patency of the bilateral popliteal veins. Contrast injection demonstrates patency of the bilateral popliteal and superficial femoral veins. There is occlusive thrombus extending from the left common femoral vein and the right common iliac vein through the mid/caudal aspect of the IVC secondary to a moderate to long segment length presumably extrinsic malignant occlusion secondary to known adjacent bulky retroperitoneal lymphadenopathy. Following pharmacological and mechanical thrombectomy as detailed above, there is restored flow as demonstrated on limited right central pelvic venogram with large amount of residual thrombus. Following infusion catheter placement, both side ports traverse both occluded segments of the IVC and pelvic venous systems. IMPRESSION: 1. 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 2. Successful initiation of bilateral mechanical and pharmacologic thrombectomy with bilateral infusion catheters appropriately positioned. PLAN: The patient will be transferred to ICU for overnight lytic infusion and will return tomorrow for repeat venogram and potential intervention. Electronically Signed   By: Sandi Mariscal M.D.   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 Patient was initiated bilateral  pelvic/IVC catheter directed thrombolysis day prior (04/15/2019), and returns today following 24 hours of tPA infusion. EXAM: 1. IR THROMB F/U EVAL ART/VEN FINAL DAY 2. FLUOROSCOPIC GUIDED ANGIOPLASTY AND STENT PLACEMENT OF THE IVC COMPARISON:  Initiation of bilateral catheter directed pelvic venous and IVC thrombolysis - 04/15/2019; CT  abdomen pelvis - 04/11/2019 MEDICATIONS: Heparin 7000 units CONTRAST:  85 cc Omnipaque 300 ANESTHESIA/SEDATION: Moderate (conscious) sedation was employed during this procedure. A total of Versed 2 mg and Dilaudid 3 mg was administered intravenously. Moderate Sedation Time: 128 minutes. The patient's level of consciousness and vital signs were monitored continuously by radiology nursing throughout the procedure under my direct supervision. FLUOROSCOPY TIME:  14 minutes, 12 seconds (732.2 mGy) COMPLICATIONS: None immediate. TECHNIQUE: Informed written consent was obtained from the patient after a discussion of the risks, benefits and alternatives to treatment. Questions regarding the procedure were encouraged and answered. A timeout was performed prior to the initiation of the procedure. The patient was placed prone on the fluoroscopy table and the skin posterior to the bilateral knees as well as the external portion the existing bilateral vascular sheaths and infusion catheters was prepped and draped in usual sterile fashion. The surrounding subcutaneous tissues about both popliteal approach vascular sheath was anesthetized with 1% lidocaine with epinephrine. Next, post lysis venograms were performed from the bilateral infusion catheters following removal of the bilateral infusion wires. Images were reviewed and the decision was made to proceed with additional mechanical thrombectomy. With the use of a vertebral catheter exchange length stiff Glidewires were advanced from both existing popliteal vascular sheaths superior to the nearly obstructing caval lesion. Contrast injection  confirmed appropriate positioning. Stiff glide wires were advanced into the left subclavian vein. 4000 units of heparin was administered intravenously. Next, beginning with the left lower extremity, the existing 8 French vascular sheath was exchanged over the glidewire for a 13 [place] vascular sheath. The 16 mm diameter Clottreiever Inari mechanical thrombectomy device was then utilized to perform 3 passes from the left popliteal approach from at and just below level of the obstructing caval lesion. Post thrombectomy images were obtained from the left lower extremity. Next, the existing right lower extremity 6 French popliteal vein vascular sheath was exchanged over a guidewire for a 13 French vascular sheath. Again, from the right lower extremity approach, the 16 mm diameter Clottreiever Inari mechanical thrombectomy device was then utilized to perform 3 passes from the level at and just below the obstructing caval lesion. Post thrombectomy images were obtained from the right lower extremity. An ACT level was obtained and an additional 3000 units of heparin was administered intravenously. The long segment eccentric irregular narrowing/subtotal occlusion of the IVC was subsequently balloon angioplastied at multiple stations with a 14 mm x 4 cm Atlas balloon. Despite adequate balloon apposition, post angioplasty venogram images demonstrate a recurrent hemodynamically significant narrowing. As such, a 20 mm x 60 mm Venovo venous stent was deployed across the superior and mid aspect of the caval lesion and was subsequently balloon angioplastied at multiple stations initially with a 16 mm x 4 cm atlas balloon and ultimately with an 18 mm x 4 cm balloon. Post stent deployment venogram images were obtained however there is felt to be incomplete coverage of the distal aspect of the malignant narrowing. As such, an additional 20 mm x 40 mm Venovo venous stent was deployed overlapping the mid/caudal aspect of venous stent  and adequately covering the caudal aspect caval lesion. Both overlapping venous stents were subsequently balloon angioplastied at multiple stations with the 18 mm atlas balloon and completion venogram images were obtained from both lower extremities. Images were reviewed and the procedure was terminated. All wires, catheters and sheaths were removed from the bilateral popliteal vein accesses and hemostasis was achieved with manual compression. Dressings applied. The  patient tolerated the procedure well without immediate postprocedural complication. FINDINGS: Initial post lysis venogram performed from the left lower extremity infusion catheter demonstrates restoration of flow through the left pelvis with moderate amount residual thrombus primarily within the peripheral aspect of the left external iliac vein as well as the left common and central aspect of the left superficial femoral veins. There is passage of contrast beyond the nearly obstructing lesion involving mid/caudal aspect of the IVC. Initial post lysis venogram performed from the right lower extremity demonstrates persistent occlusion at the level of the right common iliac vein. Following 3 passes from each lower extremity with the 16 mm diameter Clottreiever Inari mechanical thrombectomy device, nearly all residual clot burden within the pelvis was removed. 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 This lesion was again noted to be significantly caudal to the confluence of the bilateral renal veins. As such, nearly obstructing caval lesion was treated with overlapping stent deployment of 20 mm diameter Venovo venous stents which were subsequently balloon angioplastied to 18 mm diameter. Completion images demonstrate  a technically excellent result with restored brisk flow through the bilateral pelvic systems and the IVC. There is a mild residual narrowing at the level of the mid/caudal aspect of the IVC, though not felt to result in a residual hemodynamically significant stenosis. No evidence of complication. Specifically, no evidence of contrast extravasation or vessel dissection. 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 PLAN: - Maintain IV heparin drip until patient is successfully converted to long-term anticoagulant. - Recommend maintaining SCD devices while in bed. Electronically Signed   By: Sandi Mariscal M.D.   On: 04/16/2019 16:26    ASSESSMENT AND PLAN:  1.  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  extensive Left lower extremity DVT, s/p thrombolysis and IVC stent placement 4. Fever with unknown origin 5. Ilius, improving  6. Worsening anemia, s/p one unit blood 2/7 7.deconditioning  8.  History of stage Ib seminoma testicular cancer diagnosed in 2012, status post right orchiectomy-no adjuvant chemotherapy given  Recommendations: -I have reviewed his lab, recent blood culture, and his CT scan findings and discussed with patient. -Although he still has intermittent fever, his white count is trending down, blood culture has been negative, last one from 2/6.  I do not think he is septic, his fever could be related to his cancer or his hematoma  -His ileus is improving, he is tolerating liquid without any issue, he is also on nutritional supplement, he is having bowel movement a few times a day.  -After lengthy discussion and carefully reviewed the potential high risk from chemo again, patient agrees to proceed chemo today. -His creatinine is slightly elevated, however GFR>60, will be adequate for  cisplatin -I have discussed with charge nurse and 6E, and pharmacy, will start him chemo today, hopefully before noon -pt may need to move to [place] if Dr. Horris Latino agrees with stopping his tele  -I will f/u daily     Timothy [name]  04/24/2019 .

## 2019-04-24 NOTE — Progress Notes (Signed)
Peripherally Inserted Central Catheter/Midline Placement  The IV Nurse has discussed with the patient and/or persons authorized to consent for the patient, the purpose of this procedure and the potential benefits and risks involved with this procedure.  The benefits include less needle sticks, lab draws from the catheter, and the patient may be discharged home with the catheter. Risks include, but not limited to, infection, bleeding, blood clot (thrombus formation), and puncture of an artery; nerve damage and irregular heartbeat and possibility to perform a PICC exchange if needed/ordered by physician.  Alternatives to this procedure were also discussed.  Bard Power PICC patient education guide, fact sheet on infection prevention and patient information card has been provided to patient /or left at bedside.    PICC/Midline Placement Documentation  PICC Double Lumen 04/24/19 PICC Left Brachial 48 cm 0 cm (Active)  Indication for Insertion or Continuance of Line Administration of hyperosmolar/irritating solutions (i.e. TPN, Vancomycin, etc.) 04/24/19 1650  Exposed Catheter (cm) 1 cm 04/24/19 1650  Site Assessment Clean;Dry;Intact 04/24/19 1650  Lumen #1 Status Flushed;Blood return noted 04/24/19 1650  Lumen #2 Status Flushed;Blood return noted 04/24/19 1650  Dressing Type Transparent 04/24/19 1650  Dressing Status Clean;Dry;Intact;Antimicrobial disc in place;Other (Comment) 04/24/19 1650  Dressing Intervention New dressing 04/24/19 1650  Dressing Change Due 05/01/19 04/24/19 1650       Christella Noa Albarece 04/24/2019, 4:52 PM

## 2019-04-24 NOTE — Progress Notes (Signed)
[facility] consulted for callback d/t questions regarding PICC placement. Called unit and spoke with pt's nurse. Confirmed that PICC order is for IR to place and pt's nurse should call them immediately for scheduling of line placement.

## 2019-04-24 NOTE — Plan of Care (Signed)
  Problem: Education: Goal: Knowledge of General Education information will improve Description Including pain rating scale, medication(s)/side effects and non-pharmacologic comfort measures Outcome: Progressing   Problem: Health Behavior/Discharge Planning: Goal: Ability to manage health-related needs will improve Outcome: Progressing   

## 2019-04-24 NOTE — Progress Notes (Addendum)
Spoke with both teams, tentative plan is late afternoon for PICC placement by IR or VAST teams. Will update MD. SRP, RN

## 2019-04-24 NOTE — Progress Notes (Signed)
Pt transferred to [place] report called to [facility]. Pt stable and acknowledged understanding of transfer. SRP, RN

## 2019-04-24 NOTE — Progress Notes (Signed)
Pt tolerated Cisplatin fine. Pt c/o feeling "overwhelmed"  And slightly SOB when Etopside initially started. Infusion rate reduced to half and pt stated immediate relief. Tolerated rest of infusion without complication.

## 2019-04-24 NOTE — Progress Notes (Signed)
Physical Therapy Treatment Patient Details Name: Timothy [name] MRN: [id number] DOB: 05/21/1980 Today's Date: 04/24/2019    History of Present Illness Pt is a 39 y.o. male admitted 04/10/19 with LLE swelling and back pain; pt lives in [place] but drove down to [place] to visit mother ~1 wk ago. Found to have extensive LLE DVT; also with significant lymphadenopathy in the retroperitoneal area concerning for metastatic disease of unknown primary. Biopsy consistent with metastatic seminoma. S/p mechanical and pharmacological thrombolysis with stenting of the IVC for resistant malignant narrowing. Pt with ilieus 2/1. 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    PT Comments    Pt OOB in recliner working on his computer. 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  Assisted with amb a greater distance in hallway.  General transfer comment: good safety cognition and use of hands to steady self.   General Gait Details: tolerated an increased distance with mild pain in low back and B LE "stiffness"  "edema" which pt stated is "more so today". Returned to recliner and elevated feet.  Pt would like to walk everyday.  Instructed him to also asking nursing staff.  Pt very motivated to get better.    Follow Up Recommendations  Supervision for mobility/OOB;CIR     Equipment Recommendations  Rolling walker with 5" wheels    Recommendations for Other Services       Precautions / Restrictions Precautions Precautions: Fall Precaution Comments: weeping blisters on posterior knee Restrictions Weight Bearing Restrictions: No    Mobility  Bed Mobility               General bed mobility comments: OOB in recliner  Transfers Overall transfer level: Needs assistance Equipment used: Rolling walker (2 wheeled)(Bari) Transfers: Sit to/from Stand Sit to Stand: Supervision;Min guard Stand pivot transfers:  Supervision;Min guard       General transfer comment: good safety cognition and use of hands to steady self  Ambulation/Gait Ambulation/Gait assistance: Supervision;Min guard Gait Distance (Feet): 225 Feet Assistive device: Rolling walker (2 wheeled) Gait Pattern/deviations: Decreased stride length;Trunk flexed;Step-through pattern;Wide base of support;Decreased step length - left Gait velocity: decreased   General Gait Details: tolerated an increased distance with mild pain in low back and B LE "stiffness"  "edema" which pt stated is "more so today".   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      Exercises      General Comments        Pertinent Vitals/Pain Pain Assessment: Faces Faces Pain Scale: Hurts a little bit Pain Location: B LE stiff/edema Pain Descriptors / Indicators: Discomfort Pain Intervention(s): Monitored during  session    Home Living                      Prior Function            PT Goals (current goals can now be found in the care plan section) Progress towards PT goals: Progressing toward goals    Frequency    Min 3X/week      PT Plan Current plan remains appropriate    Co-evaluation              AM-PAC PT "6 Clicks" Mobility   Outcome Measure  Help needed turning from your back to your side while in a flat bed without using bedrails?: A Little Help needed moving from lying on your back to sitting on the side of a flat bed without using bedrails?: A Little Help needed moving to and from  a bed to a chair (including a wheelchair)?: A Little Help needed standing up from a chair using your arms (e.g., wheelchair or bedside chair)?: A Little Help needed to walk in hospital room?: A Little Help needed climbing 3-5 steps with a railing? : A Lot 6 Click Score: 17    End of Session Equipment Utilized During Treatment: Gait belt Activity Tolerance: Patient limited by fatigue;Patient limited by pain Patient left: with call bell/phone within reach;in bed;with family/visitor present Nurse Communication: Mobility status;Patient requests pain meds PT Visit Diagnosis: Other abnormalities of gait and mobility (R26.89);Pain;Difficulty in walking, not elsewhere classified (R26.2)     Time: [id number] PT Time Calculation (min) (ACUTE ONLY): 21 min  Charges:  $Gait Training: 8-22 mins                     Rica Koyanagi  PTA Acute  Rehabilitation Services Pager      [phone] Office      [phone]

## 2019-04-24 NOTE — Progress Notes (Signed)
Pt arrived to 1620 from 1439. VSS obtained. Yellow MEWS triggered. Pt has been in yellow MEWS previously. No acute change noted. Will continue to monitor.

## 2019-04-25 ENCOUNTER — Inpatient Hospital Stay (HOSPITAL_COMMUNITY): Payer: BLUE CROSS/BLUE SHIELD

## 2019-04-25 LAB — CBC WITH DIFFERENTIAL/PLATELET
Abs Immature Granulocytes: 0.3 10*3/uL — ABNORMAL HIGH (ref 0.00–0.07)
Basophils Absolute: 0 10*3/uL (ref 0.0–0.1)
Basophils Relative: 0 %
Eosinophils Absolute: 0 10*3/uL (ref 0.0–0.5)
Eosinophils Relative: 0 %
HCT: 24.4 % — ABNORMAL LOW (ref 39.0–52.0)
Hemoglobin: 7.6 g/dL — ABNORMAL LOW (ref 13.0–17.0)
Immature Granulocytes: 3 %
Lymphocytes Relative: 3 %
Lymphs Abs: 0.4 10*3/uL — ABNORMAL LOW (ref 0.7–4.0)
MCH: 28.9 pg (ref 26.0–34.0)
MCHC: 31.1 g/dL (ref 30.0–36.0)
MCV: 92.8 fL (ref 80.0–100.0)
Monocytes Absolute: 0.4 10*3/uL (ref 0.1–1.0)
Monocytes Relative: 4 %
Neutro Abs: 10.1 10*3/uL — ABNORMAL HIGH (ref 1.7–7.7)
Neutrophils Relative %: 90 %
Platelets: 354 10*3/uL (ref 150–400)
RBC: 2.63 MIL/uL — ABNORMAL LOW (ref 4.22–5.81)
RDW: 14.6 % (ref 11.5–15.5)
WBC: 11.2 10*3/uL — ABNORMAL HIGH (ref 4.0–10.5)
nRBC: 0.3 % — ABNORMAL HIGH (ref 0.0–0.2)

## 2019-04-25 LAB — COMPREHENSIVE METABOLIC PANEL
ALT: 24 U/L (ref 0–44)
AST: 24 U/L (ref 15–41)
Albumin: 2.7 g/dL — ABNORMAL LOW (ref 3.5–5.0)
Alkaline Phosphatase: 83 U/L (ref 38–126)
Anion gap: 9 (ref 5–15)
BUN: 19 mg/dL (ref 6–20)
CO2: 21 mmol/L — ABNORMAL LOW (ref 22–32)
Calcium: 7.4 mg/dL — ABNORMAL LOW (ref 8.9–10.3)
Chloride: 108 mmol/L (ref 98–111)
Creatinine, Ser: 0.83 mg/dL (ref 0.61–1.24)
GFR calc Af Amer: >60 mL/min (ref >60)
GFR calc non Af Amer: >60 mL/min (ref >60)
Glucose, Bld: 153 mg/dL — ABNORMAL HIGH (ref 70–99)
Potassium: 3.5 mmol/L (ref 3.5–5.1)
Sodium: 138 mmol/L (ref 135–145)
Total Bilirubin: 0.9 mg/dL (ref 0.3–1.2)
Total Protein: 6.3 g/dL — ABNORMAL LOW (ref 6.5–8.1)

## 2019-04-25 LAB — MAGNESIUM: Magnesium: 2.7 mg/dL — ABNORMAL HIGH (ref 1.7–2.4)

## 2019-04-25 MED ORDER — HOT PACK MISC ONCOLOGY
1.0000 | Freq: Once | Status: AC | PRN
  Filled 2019-04-25: qty 1

## 2019-04-25 MED ORDER — SODIUM CHLORIDE 0.9 % IV SOLN
20.0000 mg/m2 | Freq: Once | INTRAVENOUS | Status: AC
  Administered 2019-04-25: 14:00:00 46 mg via INTRAVENOUS
  Filled 2019-04-25: qty 46

## 2019-04-25 MED ORDER — SODIUM CHLORIDE 0.9 % IV SOLN: Freq: Once | INTRAVENOUS | Status: AC

## 2019-04-25 MED ORDER — SODIUM CHLORIDE 0.9 % IV SOLN
10.0000 mg | Freq: Once | INTRAVENOUS | Status: AC
  Administered 2019-04-25: 10 mg via INTRAVENOUS
  Filled 2019-04-25: qty 1

## 2019-04-25 MED ORDER — POTASSIUM CHLORIDE 2 MEQ/ML IV SOLN: Freq: Once | INTRAVENOUS | Status: DC

## 2019-04-25 MED ORDER — SODIUM CHLORIDE 0.9 % IV SOLN
100.0000 mg/m2 | Freq: Once | INTRAVENOUS | Status: AC
  Administered 2019-04-25: 15:00:00 230 mg via INTRAVENOUS
  Filled 2019-04-25: qty 11.5

## 2019-04-25 MED ORDER — KCL IN DEXTROSE-NACL 20-5-0.45 MEQ/L-%-% IV SOLN
Freq: Once | INTRAVENOUS | Status: AC
  Filled 2019-04-25: qty 1000

## 2019-04-25 NOTE — Progress Notes (Addendum)
HEMATOLOGY-ONCOLOGY PROGRESS NOTE  SUBJECTIVE: Tolerated day 1 cycle 1 of chemotherapy well overall.  Felt a pressure-like sensation in his chest during the etoposide infusion when the rate was decreased, the sensation resolved.  Denies mucositis, nausea, vomiting.  Bowels are moving.  Abdomen still distended but he denies abdominal pain.  Still with lower extremity edema which is unchanged.  I have reviewed the past medical history, past surgical history, social history and family history with the patient and they are unchanged from previous note.   PHYSICAL EXAMINATION:  Vitals:   04/24/19 2201 04/25/19 0455  BP: 138/90 129/89  Pulse: 98 94  Resp: 20 18  Temp: 98.5 F (36.9 C) 98 F (36.7 C)  SpO2: 96% 98%   Filed Weights   04/10/19 2005 04/18/19 0600  Weight: 200 lb (90.7 kg) 216 lb 14.9 oz (98.4 kg)    Intake/Output from previous day: 02/08 0701 - 02/09 0700 In: 1630.7 [P.O.:360; I.V.:418.7; IV Piggyback:852] Out: 1829 [Urine:4725]  GENERAL:alert, no distress and comfortable LEGS: Mild left lower extremity edema, no tenderness, or skin erythema, no edema on right leg  Abdomen: soft, nontender   LABORATORY DATA:  I have reviewed the data as listed CMP Latest Ref Rng & Units 04/25/2019 04/24/2019 04/23/2019  Glucose 70 - 99 mg/dL 153(H) 127(H) 113(H)  BUN 6 - 20 mg/dL _0 Creatinine 0.61 - 1.24 mg/dL 0.83 1.39(H) 1.32(H)  Sodium 135 - 145 mmol/L 138 135 135  Potassium 3.5 - 5.1 mmol/L 3.5 2.8(L) 3.3(L)  Chloride 98 - 111 mmol/L 108 105 106  CO2 22 - 32 mmol/L 21(L) 23 19(L)  Calcium 8.9 - 10.3 mg/dL 7.4(L) 7.6(L) 7.5(L)  Total Protein 6.5 - 8.1 g/dL 6.3(L) - 6.1(L)  Total Bilirubin 0.3 - 1.2 mg/dL 0.9 - 0.8  Alkaline Phos 38 - 126 U/L 83 - 67  AST 15 - 41 U/L 24 - 32  ALT 0 - 44 U/L 24 - 27    Lab Results  Component Value Date   WBC 11.2 (H) 04/25/2019   HGB 7.6 (L) 04/25/2019   HCT 24.4 (L) 04/25/2019   MCV 92.8 04/25/2019   PLT 354 04/25/2019   NEUTROABS  10.1 (H) 04/25/2019    CT ABDOMEN PELVIS WO CONTRAST  Result Date: 04/22/2019 CLINICAL DATA:  Fever, abdominal distension, testicular cancer, status post right orchiectomy EXAM: CT ABDOMEN AND PELVIS WITHOUT CONTRAST TECHNIQUE: Multidetector CT imaging of the abdomen and pelvis was performed following the standard protocol without IV contrast. COMPARISON:  Abdominal radiographs, 04/21/2019, CT abdomen pelvis, 04/17/2018, 04/11/2019 FINDINGS: Lower chest: Unchanged small bilateral pleural effusions and associated atelectasis or consolidation. Hepatobiliary: No solid liver abnormality is seen. No gallstones, gallbladder wall thickening, or biliary dilatation. Pancreas: Unremarkable. No pancreatic ductal dilatation or surrounding inflammatory changes. Spleen: Normal in size without significant abnormality. Adrenals/Urinary Tract: Adrenal glands are unremarkable. Mild right hydronephrosis, slightly increased compared to prior examination. Foley catheter in the urinary bladder. Stomach/Bowel: Stomach is within normal limits. The colon remains diffusely gas and fluid-filled to the rectum, caliber of the colon slightly increased, measuring up to 9 cm in the transverse colon. Sigmoid diverticulosis. Vascular/Lymphatic: Stent of the infrarenal IVC. Unchanged bulky retroperitoneal lymphadenopathy, largest left retroperitoneal nodes measuring up to 3.5 x 2.8 cm (series 3, image 60). Reproductive: No mass or other significant abnormality. Other: Anasarca. Retroperitoneal hematomas in the left psoas (series 3, image 57) and in the retroperitoneal fat posterior to the right psoas (series 3, image 66), not significantly changed. Musculoskeletal: No  acute or significant osseous findings. IMPRESSION: 1. The colon remains diffusely gas and fluid-filled to the rectum, slightly increased in caliber compared to prior examination. Findings favor ileus. No specific findings to suggest infectious or inflammatory colitis. 2.  Retroperitoneal hematomas in the left psoas and retroperitoneal fat posterior to the right psoas are not significantly changed. 3. Unchanged bulky retroperitoneal lymphadenopathy. 4. Mild right hydronephrosis, slightly increased compared to prior examination, the mid to distal ureter presumably obstructed by adjacent lymph nodes. 5. Pleural effusions and anasarca. 6. Status post right orchiectomy. Electronically Signed   By: Alex  Bibbey M.D.   On: 04/22/2019 13:21   DG Abd 1 View  Result Date: 04/21/2019 CLINICAL DATA:  Ileus, history of IVC stent placement EXAM: ABDOMEN - 1 VIEW COMPARISON:  04/20/2019 FINDINGS: Supine frontal view of the abdomen and pelvis excludes the left flank and lower pelvis by collimation. There is progressive gaseous distension of large and small bowel. IVC stent unchanged. No masses or abnormal calcifications. IMPRESSION: Progressive gaseous distension of large and small bowel consistent with ileus. Electronically Signed   By: Michael  Brown M.D.   On: 04/21/2019 08:42   DG Abd 1 View  Result Date: 04/20/2019 CLINICAL DATA:  Diffuse abdominal tenderness, abdominal distension EXAM: ABDOMEN - 1 VIEW COMPARISON:  04/19/2019 FINDINGS: 2 supine frontal views of the abdomen and pelvis demonstrate persistent diffuse gaseous distention of the colon likely postoperative ileus. No masses or abnormal calcifications. Venous stent again noted. IMPRESSION: 1. Continued postoperative ileus. Electronically Signed   By: Michael  Brown M.D.   On: 04/20/2019 15:10   DG Abd 1 View  Result Date: 04/19/2019 CLINICAL DATA:  Evaluate ileus. EXAM: ABDOMEN - 1 VIEW COMPARISON:  April 19, 2019 (acquired at 8:04 a.m. FINDINGS: Numerous dilated air-filled loops of large bowel are again seen. This is unchanged in appearance when compared to the prior study. The visualized small bowel loops are normal in caliber. A radiopaque stent is seen overlying the medial aspect of the mid right abdomen. No radio-opaque  calculi or other significant radiographic abnormality are seen. IMPRESSION: 1. Stable findings likely consistent with a postoperative ileus, unchanged in appearance when compared to the prior study dated April 19, 2019 (acquired at 8:04 a.m.). Electronically Signed   By: Thaddeus  Houston M.D.   On: 04/19/2019 20:28   DG Abd 1 View  Result Date: 04/19/2019 CLINICAL DATA:  Generalized abdominal pain, postoperative ileus. EXAM: ABDOMEN - 1 VIEW COMPARISON:  April 18, 2019. FINDINGS: Stable dilated air-filled colon is noted most consistent with postoperative ileus. No significant small bowel dilatation is noted. Phleboliths are noted in the pelvis. IVC stent is again noted. IMPRESSION: Stable dilated air-filled colon is noted most consistent with postoperative ileus. Electronically Signed   By: James  Green Jr M.D.   On: 04/19/2019 09:14   CT ANGIO CHEST PE W OR WO CONTRAST  Result Date: 04/19/2019 CLINICAL DATA:  Hypoxia. EXAM: CT ANGIOGRAPHY CHEST WITH CONTRAST TECHNIQUE: Multidetector CT imaging of the chest was performed using the standard protocol during bolus administration of intravenous contrast. Multiplanar CT image reconstructions and MIPs were obtained to evaluate the vascular anatomy. CONTRAST:  100mL OMNIPAQUE IOHEXOL 300 MG/ML SOLN, 80mL OMNIPAQUE IOHEXOL 350 MG/ML SOLN COMPARISON:  04/11/2019 FINDINGS: Cardiovascular: Evaluation for acute pulmonary emboli is severely limited by respiratory motion artifact and suboptimal contrast bolus timing. Given these limitations, no large centrally located pulmonary embolism was detected.Detection of smaller pulmonary emboli is not possible on this exam. The main pulmonary artery is   within normal limits for size. There is no CT evidence of acute right heart strain. The visualized aorta is normal. Heart size is normal, without pericardial effusion. Mediastinum/Nodes: --No mediastinal or hilar lymphadenopathy. --No axillary lymphadenopathy. --there is an  enlarged left supraclavicular lymph node measuring 1.4 cm (axial series 5, image 5). --Normal thyroid gland. --The esophagus is unremarkable Lungs/Pleura: There is atelectasis at the lung bases bilaterally. There are new small bilateral pleural effusions, left greater than right. Upper Abdomen: No acute abnormality. Musculoskeletal: No chest wall abnormality. No acute or significant osseous findings. Review of the MIP images confirms the above findings. IMPRESSION: 1. Evaluation for acute pulmonary emboli is severely limited by respiratory motion artifact and suboptimal contrast bolus timing. Given these limitations, no large centrally located pulmonary embolism was detected. 2. New small bilateral pleural effusions, left greater than right. 3. Enlarged left supraclavicular lymph node measuring 1.4 cm. This may be malignant or reactive in etiology. It is amenable to percutaneous biopsy as clinically indicated. Electronically Signed   By: Constance Holster M.D.   On: 04/19/2019 23:17   CT Angio Chest PE W and/or Wo Contrast  Result Date: 04/11/2019 CLINICAL DATA:  Positive lower extremity DVT, PE suspected. Back pain and leg swelling, history of testicular cancer. EXAM: CT ANGIOGRAPHY CHEST CT ABDOMEN AND PELVIS WITH CONTRAST TECHNIQUE: Multidetector CT imaging of the chest was performed using the standard protocol during bolus administration of intravenous contrast. Multiplanar CT image reconstructions and MIPs were obtained to evaluate the vascular anatomy. Multidetector CT imaging of the abdomen and pelvis was performed using the standard protocol during bolus administration of intravenous contrast. CONTRAST:  161m OMNIPAQUE IOHEXOL 350 MG/ML SOLN COMPARISON:  None. FINDINGS: CTA CHEST FINDINGS Cardiovascular: Suboptimal opacification of the pulmonary arteries limits evaluation beyond the lobar level. No large central or lobar pulmonary arterial filling defects are identified. Central pulmonary arteries are  normal caliber. Normal cardiac size. 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 The aorta is normal caliber. Shared origin of the brachiocephalic and left common carotid arteries. Great vessels are otherwise unremarkable. Mediastinum/Nodes: No enlarged mediastinal, hilar or axillary adenopathy is seen. No acute abnormality of the trachea or esophagus. Thyroid gland is normal. Asymmetric thickening of the left sternocleidomastoid is likely related to muscle activation. Lungs/Pleura: Respiratory motion artifact is mild. No consolidation, features of edema, pneumothorax, or effusion. No suspicious pulmonary nodules or masses. Musculoskeletal: No acute osseous abnormality or suspicious osseous lesion in the imaged chest. No worrisome chest wall lesions. Mild bilateral gynecomastia is noted, right slightly greater left. Review of the MIP images confirms the above findings. CT ABDOMEN and PELVIS FINDINGS Hepatobiliary: No focal liver abnormality is seen. No gallstones, gallbladder wall thickening, or biliary dilatation. Pancreas: Unremarkable. No pancreatic ductal dilatation or surrounding inflammatory changes. Spleen: Normal in size without focal abnormality. Adrenals/Urinary Tract: Adrenal glands are unremarkable. Kidneys are normal, without renal calculi, focal lesion, or hydronephrosis. Bladder is unremarkable. Stomach/Bowel: Distal esophagus, stomach and duodenal sweep are unremarkable. No small bowel wall thickening or dilatation. No evidence of obstruction. A normal appendix is visualized. No colonic dilatation or wall thickening. Scattered colonic diverticula without focal pericolonic inflammation to suggest diverticulitis. Vascular/Lymphatic: There is extensive retroperitoneal and pelvic adenopathy, some of the larger nodes include a left periaortic node measuring up to 3.2 cm (2/43) and a 2 cm aortocaval lymph node (2/35) there is distal  abdominal aortic encasement by a extensive retroperitoneal  adenopathy. Furthermore there is likely direct compression and/or direct nodal involvement of the inferior vena cava (2/40 with the lower IVC appearing expanded by hypoattenuating filling defects. Edematous changes are noted throughout the pelvis with venous engorgement and collateralization. Reproductive: The prostate and seminal vesicles are unremarkable. There is a varicocele seen in the left scrotum. External genitalia are incompletely included on this exam. Other: There is circumferential body wall edema from the level of the pelvis inferiorly more pronounced throughout the left lower extremity. Free fluid is seen in the deep pelvis, likely reactive or secondary to venous occlusion with developing collateralization. Musculoskeletal: No acute osseous abnormality or suspicious osseous lesion. Normal symmetric muscular enhancement. Review of the MIP images confirms the above findings. IMPRESSION: 1. Suboptimal opacification of the pulmonary arteries limits evaluation beyond the lobar level. No large central or lobar pulmonary arterial filling defects are identified. 2. Extensive retroperitoneal and pelvic adenopathy. There is likely direct compression of the lower IVC with the distal IVC and iliac veins appearing expanded by hypoattenuating thrombus. There is complete encasement of the abdominal aorta as well but without luminal narrowing. Findings are highly concerning for metastatic disease, particularly given patient history of testicular cancer. 3. Left scrotal varicocele. External genitalia are incompletely included on this exam. Consider further evaluation scrotal ultrasound given extensive retroperitoneal adenopathy and patient's history of testicular cancer. 4. Free fluid in the deep pelvis is likely reactive or secondary to venous occlusion with developing collateralization. 5. Colonic diverticulosis without evidence of diverticulitis. 6. Mild  bilateral gynecomastia, right slightly greater left. 7. 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 Critical Value/emergent results were called by telephone at the time of interpretation on 04/11/2019 at 1:33 am to providerKELLY HUMES , who verbally acknowledged these results. Electronically Signed   By: Price  DeHay M.D.   On: 04/11/2019 01:33   MR BRAIN W WO CONTRAST  Result Date: 04/15/2019 CLINICAL DATA:  History of testicular cancer. Assess for metastatic disease. EXAM: MRI HEAD WITHOUT AND WITH CONTRAST TECHNIQUE: Multiplanar, multiecho pulse sequences of the brain and surrounding structures were obtained without and with intravenous contrast. CONTRAST:  9mL GADAVIST GADOBUTROL 1 MMOL/ML IV SOLN COMPARISON:  None. FINDINGS: Brain: The brain has a normal appearance without evidence of malformation, atrophy, old or acute small or large vessel infarction, mass lesion, hemorrhage, hydrocephalus or extra-axial collection. After contrast administration, no abnormal enhancement occurs. Vascular: Major vessels at the base of the brain show flow. Venous sinuses appear patent. Skull and upper cervical spine: Normal. Sinuses/Orbits: Clear/normal. Other: None significant. IMPRESSION: Normal examination.  No evidence of metastatic disease. Electronically Signed   By: Mark  Shogry M.D.   On: 04/15/2019 11:07   US Scrotum  Result Date: 04/11/2019 CLINICAL DATA:  Concern for testicular cancer EXAM: ULTRASOUND OF SCROTUM TECHNIQUE: Complete ultrasound examination of the testicles, epididymis, and other scrotal structures was performed. COMPARISON:  None. FINDINGS: Right testicle Measurements: Surgically removed. Left testicle Measurements: 3.9 x 2.2 x 2.6 cm. No mass or microlithiasis visualized. Right epididymis:  Not visualized Left epididymis:  Normal in size and appearance. Hydrocele:  Moderate left hydrocele Varicocele:  Moderate left varicocele.  IMPRESSION: No left testicular abnormality.  Prior right orchiectomy. Moderate left hydrocele and varicocele. Electronically Signed   By: Kevin  Dover M.D.   On: 04/11/2019 02:16   CT ABDOMEN PELVIS W CONTRAST  Result Date: 04/18/2019 CLINICAL DATA:  Testicular carcinoma post RIGHT orchiectomy, LEFT   lower extremity DVT, abdominal distension, recent CT with intra-abdominal and retroperitoneal lymphadenopathy concerning for metastatic cancer EXAM: CT ABDOMEN AND PELVIS WITH CONTRAST TECHNIQUE: Multidetector CT imaging of the abdomen and pelvis was performed using the standard protocol following bolus administration of intravenous contrast. Sagittal and coronal MPR images reconstructed from axial data set. CONTRAST:  100mL OMNIPAQUE IOHEXOL 300 MG/ML SOLN IV. No oral contrast. COMPARISON:  04/11/2019 FINDINGS: Lower chest: Dependent bibasilar atelectasis in the lower lobes Hepatobiliary: New dependent density within gallbladder question vicarious excretion of contrast material from prior CT exam versus sludge. No gallbladder wall thickening. Liver unremarkable. Pancreas: Normal appearance Spleen: Normal appearance Adrenals/Urinary Tract: Adrenal glands, kidneys, and ureters normal appearance. Foley catheter and air within urinary bladder. Stomach/Bowel: Retrocecal appendix, normal. Gaseous distention of colon from cecum to descending colon. Small amount of stool in fluid within proximal colon. Tapering of the distal colon with few scattered distal colonic diverticula but no evidence of diverticulitis. Rectosigmoid colon decompressed. No discrete point of obstruction seen; findings favor ileus. Stomach and small bowel loops unremarkable Vascular/Lymphatic: Prior stenting of the IVC. Aorta normal caliber without aneurysm. IVC, iliac veins, common femoral veins patent without thrombus. Previously identified thrombus LEFT external iliac vein no longer seen. Again identified significant retroperitoneal adenopathy  unchanged. Reproductive: Seminal vesicles unremarkable. Minimal prostatic prominence for age. Other: Scattered stranding of retroperitoneal tissue planes increased from previous exam. No free air or free fluid. Tiny LEFT inguinal hernia containing fat Musculoskeletal: Osseous structures unremarkable. New enlargement of the LEFT psoas muscle with in ill-defined area of central low attenuation approximately 3.7 x 3.9 cm in axial dimensions extending 12 cm length, could represent LEFT psoas abscess or hematoma. This is new since 04/11/2019 making tumor infiltration unlikely. IMPRESSION: Mild gaseous distention of the colon through descending colon with decompressed rectosigmoid colon; no point of obstruction is identified, favor ileus. Persistent retroperitoneal adenopathy suspicious for metastatic disease. New ill-defined fluid collection within LEFT psoas muscle 3.7 x 3.9 x 12 cm question psoas abscess versus hematoma; LEFT psoas muscle was traversed by prior CT guided retroperitoneal lymph node biopsy. Increased retroperitoneal and mesenteric stranding. Bibasilar atelectasis. Findings called to Dr. Matthews on 04/18/2019 at 1108 hours. Electronically Signed   By: Mark  Boles M.D.   On: 04/18/2019 11:08   CT ABDOMEN PELVIS W CONTRAST  Result Date: 04/11/2019 CLINICAL DATA:  Positive lower extremity DVT, PE suspected. Back pain and leg swelling, history of testicular cancer. EXAM: CT ANGIOGRAPHY CHEST CT ABDOMEN AND PELVIS WITH CONTRAST TECHNIQUE: Multidetector CT imaging of the chest was performed using the standard protocol during bolus administration of intravenous contrast. Multiplanar CT image reconstructions and MIPs were obtained to evaluate the vascular anatomy. Multidetector CT imaging of the abdomen and pelvis was performed using the standard protocol during bolus administration of intravenous contrast. CONTRAST:  100mL OMNIPAQUE IOHEXOL 350 MG/ML SOLN COMPARISON:  None. FINDINGS: CTA CHEST FINDINGS  Cardiovascular: Suboptimal opacification of the pulmonary arteries limits evaluation beyond the lobar level. No large central or lobar pulmonary arterial filling defects are identified. Central pulmonary arteries are normal caliber. Normal cardiac size. 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 The aorta is normal caliber. Shared origin of the brachiocephalic and left common carotid arteries. Great vessels are otherwise unremarkable. Mediastinum/Nodes: No enlarged mediastinal, hilar or axillary adenopathy is seen. No acute abnormality of the trachea or esophagus. Thyroid gland is normal. Asymmetric thickening of the left sternocleidomastoid is   likely related to muscle activation. Lungs/Pleura: Respiratory motion artifact is mild. No consolidation, features of edema, pneumothorax, or effusion. No suspicious pulmonary nodules or masses. Musculoskeletal: No acute osseous abnormality or suspicious osseous lesion in the imaged chest. No worrisome chest wall lesions. Mild bilateral gynecomastia is noted, right slightly greater left. Review of the MIP images confirms the above findings. CT ABDOMEN and PELVIS FINDINGS Hepatobiliary: No focal liver abnormality is seen. No gallstones, gallbladder wall thickening, or biliary dilatation. Pancreas: Unremarkable. No pancreatic ductal dilatation or surrounding inflammatory changes. Spleen: Normal in size without focal abnormality. Adrenals/Urinary Tract: Adrenal glands are unremarkable. Kidneys are normal, without renal calculi, focal lesion, or hydronephrosis. Bladder is unremarkable. Stomach/Bowel: Distal esophagus, stomach and duodenal sweep are unremarkable. No small bowel wall thickening or dilatation. No evidence of obstruction. A normal appendix is visualized. No colonic dilatation or wall thickening. Scattered colonic diverticula without focal pericolonic inflammation to suggest diverticulitis.  Vascular/Lymphatic: There is extensive retroperitoneal and pelvic adenopathy, some of the larger nodes include a left periaortic node measuring up to 3.2 cm (2/43) and a 2 cm aortocaval lymph node (2/35) there is distal abdominal aortic encasement by a extensive retroperitoneal adenopathy. Furthermore there is likely direct compression and/or direct nodal involvement of the inferior vena cava (2/40 with the lower IVC appearing expanded by hypoattenuating filling defects. Edematous changes are noted throughout the pelvis with venous engorgement and collateralization. Reproductive: The prostate and seminal vesicles are unremarkable. There is a varicocele seen in the left scrotum. External genitalia are incompletely included on this exam. Other: There is circumferential body wall edema from the level of the pelvis inferiorly more pronounced throughout the left lower extremity. Free fluid is seen in the deep pelvis, likely reactive or secondary to venous occlusion with developing collateralization. Musculoskeletal: No acute osseous abnormality or suspicious osseous lesion. Normal symmetric muscular enhancement. Review of the MIP images confirms the above findings. IMPRESSION: 1. Suboptimal opacification of the pulmonary arteries limits evaluation beyond the lobar level. No large central or lobar pulmonary arterial filling defects are identified. 2. Extensive retroperitoneal and pelvic adenopathy. There is likely direct compression of the lower IVC with the distal IVC and iliac veins appearing expanded by hypoattenuating thrombus. There is complete encasement of the abdominal aorta as well but without luminal narrowing. Findings are highly concerning for metastatic disease, particularly given patient history of testicular cancer. 3. Left scrotal varicocele. External genitalia are incompletely included on this exam. Consider further evaluation scrotal ultrasound given extensive retroperitoneal adenopathy and patient's  history of testicular cancer. 4. Free fluid in the deep pelvis is likely reactive or secondary to venous occlusion with developing collateralization. 5. Colonic diverticulosis without evidence of diverticulitis. 6. Mild bilateral gynecomastia, right slightly greater left. 7. 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 Critical Value/emergent results were called by telephone at the time of interpretation on 04/11/2019 at 1:33 am to Kramer , who verbally acknowledged these results. Electronically Signed   By: Lovena Le M.D.   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 Patient has experienced minimal improvement since initiation of IV heparin, and as such request made for initiation of catheter directed thrombolysis for symptomatic purposes. EXAM: 1. ULTRASOUND GUIDANCE FOR VASCULAR ACCESS X2 2. FLUOROSCOPIC GUIDED  PHARMACOLOGICAL AND MECHANICAL THROMBECTOMY WITH ANGIOJET DEVICE 3. BILATERAL LOWER EXTREMITY VENOGRAM AND PLACEMENT OF INFUSION CATHETERS COMPARISON:  CT chest, abdomen pelvis-04/11/2019; abdominal MRV - 04/13/2019 MEDICATIONS: Hydralazine 10 mg IV, administered near the completion of the procedure secondary to persistently elevated blood pressure. CONTRAST:  60 cc Omnipaque 300 ANESTHESIA/SEDATION: Moderate (conscious) sedation was employed during this procedure. A total of Versed 3 mg and Fentanyl 125 mcg was administered intravenously. Moderate Sedation Time: 67 minutes. The patient's level of consciousness and vital signs were monitored continuously by radiology nursing throughout the procedure under my direct supervision. FLUOROSCOPY TIME:  18 minutes, 18 seconds (311 mGy) COMPLICATIONS: None  immediate. TECHNIQUE: Informed written consent was obtained from the patient after a discussion of the risks (including but not limited to bleeding, including nontarget bleeding given history of malignancy, infection, pulmonary embolism and necessity for venous angioplasty and/or stent placement), benefits and alternatives to treatment. Questions regarding the procedure were encouraged and answered. A timeout was performed prior to the initiation of the procedure. The patient was placed prone on the fluoroscopy table and the skin posterior to the bilateral knees was prepped and draped in the usual sterile fashion, and a sterile drape was applied covering the operative field. Maximum barrier sterile technique with sterile gowns and gloves were used for the procedure. Under direct ultrasound guidance, the left popliteal vein was access with a micropuncture kit after the overlying soft tissues were anesthetized with 1% lidocaine. An ultrasound image was saved for documentation purposed. This allowed for placement of a 8-French vascular sheath. A limited venogram was performed through the side arm of the vascular sheath. With the use of a regular glidewire, a Kumpe catheter was advanced through the left femoral, external iliac vein and through the left common iliac vein to the level of the IVC. Limited vena cavogram demonstrates complete occlusion of the distal aspect of the IVC extending to involve both left common iliac veins. Ultimately, a moderate to long segment apparently extrinsic malignant narrowing involving the mid/distal aspect of the IVC was successfully traversed with inferior venogram demonstrating patency of the more cranial infrarenal IVC. Next, with the use of a Angiojet device, approximately 250 cc of a solution of 10 mg of tPA mixed in 500 mg of saline was infused throughout the occluded segment of the IVC, left common and external iliac veins to the level of the left common femoral vein. Attention  was now paid towards access of the right lower extremity popliteal vein. After the overlying soft tissues were anesthetized with 1% lidocaine, the right popliteal vein was accessed micropuncture kit. Limited venogram was performed through the outer micro puncture sheath. This allowed for placement of a 6 French, 35 cm vascular sheath. Again, with the use of a regular glidewire, a Kumpe catheter was advanced beyond the obstructing caval lesion. Contrast injection confirmed appropriate positioning. Next, after approximately 20 minute tPA dwell, mechanical thrombectomy was performed with the Angiojet device both from the left lower extremity access as well as the right lower extremity access. Limited vena cavagram was performed via the right lower extremity access. Kumpe catheter was utilized for measurement purposes and ultimately a 90 cm/20-cm infusion catheter was placed from the right lower extremity access and a 90 cm/30 cm infusion catheter was placed from the left lower extremity access adequately covering the occluded segment of the bilateral venous systems as well as the caudal aspect of the IVC. Postprocedural spot fluoroscopic images were obtained. Both popliteal vascular sheath were secured in place.   Dressings were applied. Patient's was subjectively cold by the end of the procedure with associated elevation in his blood pressure and as such he was administered 10 mg of hydralazine and bear hugger warming device was applied. The patient otherwise tolerated the procedure well without immediate postprocedural complication. FINDINGS: Sonographic evaluation demonstrates patency of the bilateral popliteal veins. Contrast injection demonstrates patency of the bilateral popliteal and superficial femoral veins. There is occlusive thrombus extending from the left common femoral vein and the right common iliac vein through the mid/caudal aspect of the IVC secondary to a moderate to long segment length presumably  extrinsic malignant occlusion secondary to known adjacent bulky retroperitoneal lymphadenopathy. Following pharmacological and mechanical thrombectomy as detailed above, there is restored flow as demonstrated on limited right central pelvic venogram with large amount of residual thrombus. Following infusion catheter placement, both side ports traverse both occluded segments of the IVC and pelvic venous systems. IMPRESSION: 1. 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 2. Successful initiation of bilateral mechanical and pharmacologic thrombectomy with bilateral infusion catheters appropriately positioned. PLAN: The patient will be transferred to ICU for overnight lytic infusion and will return tomorrow for repeat venogram and potential intervention. Electronically Signed   By: Sandi Mariscal M.D.   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 Patient has experienced minimal improvement since initiation of IV heparin, and as such request made for initiation of catheter directed thrombolysis for symptomatic purposes. EXAM: 1. ULTRASOUND GUIDANCE FOR VASCULAR ACCESS X2 2. FLUOROSCOPIC GUIDED PHARMACOLOGICAL AND MECHANICAL THROMBECTOMY WITH ANGIOJET DEVICE 3. BILATERAL LOWER EXTREMITY VENOGRAM AND PLACEMENT OF INFUSION CATHETERS COMPARISON:  CT chest, abdomen pelvis-04/11/2019; abdominal MRV - 04/13/2019 MEDICATIONS: Hydralazine 10 mg IV, administered near the completion of the procedure secondary to persistently elevated blood pressure. CONTRAST:  60 cc Omnipaque 300 ANESTHESIA/SEDATION: Moderate (conscious) sedation was employed during this procedure. A total of Versed 3 mg  and Fentanyl 125 mcg was administered intravenously. Moderate Sedation Time: 67 minutes. The patient's level of consciousness and vital signs were monitored continuously by radiology nursing throughout the procedure under my direct supervision. FLUOROSCOPY TIME:  18 minutes, 18 seconds (751 mGy) COMPLICATIONS: None immediate. TECHNIQUE: Informed written consent was obtained from the patient after a discussion of the risks (including but not limited to bleeding, including nontarget bleeding given history of malignancy, infection, pulmonary embolism and necessity for venous angioplasty and/or stent placement), benefits and alternatives to treatment. Questions regarding the procedure were encouraged and answered. A timeout was performed prior to the initiation of the procedure. The patient was placed prone on the fluoroscopy table and the skin posterior to the bilateral knees was prepped and draped in the usual sterile fashion, and a sterile drape was applied covering the operative field. Maximum barrier sterile technique with sterile gowns and gloves were used for the procedure. Under direct ultrasound guidance, the left popliteal vein was access with a micropuncture kit after the overlying soft tissues were anesthetized with 1% lidocaine. An ultrasound image was saved for documentation purposed. This allowed for placement of a 8-French vascular sheath. A limited venogram was performed through the side arm of the vascular sheath. With the use of a regular glidewire, a Kumpe catheter was advanced through the  left femoral, external iliac vein and through the left common iliac vein to the level of the IVC. Limited vena cavogram demonstrates complete occlusion of the distal aspect of the IVC extending to involve both left common iliac veins. Ultimately, a moderate to long segment apparently extrinsic malignant narrowing involving the mid/distal aspect of the IVC was successfully traversed with inferior venogram  demonstrating patency of the more cranial infrarenal IVC. Next, with the use of a Angiojet device, approximately 250 cc of a solution of 10 mg of tPA mixed in 500 mg of saline was infused throughout the occluded segment of the IVC, left common and external iliac veins to the level of the left common femoral vein. Attention was now paid towards access of the right lower extremity popliteal vein. After the overlying soft tissues were anesthetized with 1% lidocaine, the right popliteal vein was accessed micropuncture kit. Limited venogram was performed through the outer micro puncture sheath. This allowed for placement of a 6 French, 35 cm vascular sheath. Again, with the use of a regular glidewire, a Kumpe catheter was advanced beyond the obstructing caval lesion. Contrast injection confirmed appropriate positioning. Next, after approximately 20 minute tPA dwell, mechanical thrombectomy was performed with the Angiojet device both from the left lower extremity access as well as the right lower extremity access. Limited vena cavagram was performed via the right lower extremity access. Kumpe catheter was utilized for measurement purposes and ultimately a 90 cm/20-cm infusion catheter was placed from the right lower extremity access and a 90 cm/30 cm infusion catheter was placed from the left lower extremity access adequately covering the occluded segment of the bilateral venous systems as well as the caudal aspect of the IVC. Postprocedural spot fluoroscopic images were obtained. Both popliteal vascular sheath were secured in place. Dressings were applied. Patient's was subjectively cold by the end of the procedure with associated elevation in his blood pressure and as such he was administered 10 mg of hydralazine and bear hugger warming device was applied. The patient otherwise tolerated the procedure well without immediate postprocedural complication. FINDINGS: Sonographic evaluation demonstrates patency of the  bilateral popliteal veins. Contrast injection demonstrates patency of the bilateral popliteal and superficial femoral veins. There is occlusive thrombus extending from the left common femoral vein and the right common iliac vein through the mid/caudal aspect of the IVC secondary to a moderate to long segment length presumably extrinsic malignant occlusion secondary to known adjacent bulky retroperitoneal lymphadenopathy. Following pharmacological and mechanical thrombectomy as detailed above, there is restored flow as demonstrated on limited right central pelvic venogram with large amount of residual thrombus. Following infusion catheter placement, both side ports traverse both occluded segments of the IVC and pelvic venous systems. IMPRESSION: 1. 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 2. Successful initiation of bilateral mechanical and pharmacologic thrombectomy with bilateral infusion catheters appropriately positioned. PLAN: The patient will be transferred to ICU for overnight lytic infusion and will return tomorrow for repeat venogram and potential intervention. Electronically Signed   By: John  Watts M.D.   On: 04/15/2019 17:04   US RENAL  Result Date: 04/22/2019 CLINICAL DATA:  Elevated serum creatinine level. EXAM: RENAL / URINARY TRACT ULTRASOUND COMPLETE COMPARISON:  April 17, 2019. FINDINGS: Right Kidney: Renal measurements: 13.9 x 6.4 x 6.3 cm = volume: 294 mL . Echogenicity within normal limits. No mass or hydronephrosis visualized. Left Kidney: Renal measurements: 12.2   x 6.3 x 6.2 cm = volume: 246 mL. Echogenicity within normal limits. No mass or hydronephrosis visualized. Bladder: Appears normal for degree of bladder distention. Foley catheter is noted. Other: None. IMPRESSION: No significant renal abnormality is noted. Electronically Signed   By: James  Green Jr M.D.   On:  04/22/2019 16:53   US RENAL  Result Date: 04/17/2019 CLINICAL DATA:  Urinary retention.  History of testicular carcinoma. EXAM: RENAL / URINARY TRACT ULTRASOUND COMPLETE COMPARISON:  CT scan 04/11/2019 FINDINGS: Right Kidney: Renal measurements: 13.5 x 5.5 x 6.6 cm = volume: 260.0 mL . Normal renal cortical thickness and echogenicity without focal lesions or hydronephrosis. Left Kidney: Renal measurements: 12.0 x 6.8 x 5.4 cm = volume: 229.4 mL. Normal renal cortical thickness and echogenicity without focal lesions or hydronephrosis. Bladder: Decompressed by Foley catheter. Other: None. IMPRESSION: Normal sonographic appearance of both kidneys.  No hydronephrosis. Bladder decompressed by a Foley catheter. Electronically Signed   By: P.  Gallerani M.D.   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  Patient has experienced minimal improvement since initiation of IV heparin, and as such request made for initiation of catheter directed thrombolysis for symptomatic purposes.  EXAM: 1. ULTRASOUND GUIDANCE FOR VASCULAR ACCESS X2 2. FLUOROSCOPIC GUIDED PHARMACOLOGICAL AND MECHANICAL THROMBECTOMY WITH ANGIOJET DEVICE 3. BILATERAL LOWER EXTREMITY VENOGRAM AND PLACEMENT OF INFUSION CATHETERS  COMPARISON:  CT chest, abdomen pelvis-04/11/2019; abdominal MRV - 04/13/2019  MEDICATIONS: Hydralazine 10 mg IV, administered near the completion of the procedure secondary to persistently elevated blood pressure.  CONTRAST:  60 cc Omnipaque 300  ANESTHESIA/SEDATION: Moderate (conscious) sedation was employed during this procedure. A total of Versed 3 mg and Fentanyl 125 mcg was administered intravenously.  Moderate Sedation Time: 67 minutes. The patient's level of consciousness and  vital signs were monitored continuously by radiology nursing throughout the procedure under my direct supervision.  FLUOROSCOPY TIME:  18 minutes, 18 seconds (311 mGy)  COMPLICATIONS: None immediate.  TECHNIQUE: Informed written consent was obtained from the patient after a discussion of the risks (including but not limited to bleeding, including nontarget bleeding given history of malignancy, infection, pulmonary embolism and necessity for venous angioplasty and/or stent placement), benefits and alternatives to treatment. Questions regarding the procedure were encouraged and answered. A timeout was performed prior to the initiation of the procedure.  The patient was placed prone on the fluoroscopy table and the skin posterior to the bilateral knees was prepped and draped in the usual sterile fashion, and a sterile drape was applied covering the operative field. Maximum barrier sterile technique with sterile gowns and gloves were used for the procedure.  Under direct ultrasound guidance, the left popliteal vein was access with a micropuncture kit after the overlying soft tissues were anesthetized with 1% lidocaine. An ultrasound image was saved for documentation purposed. This allowed for placement of a 8-French vascular sheath. A limited venogram was performed through the side arm of the vascular sheath.  With the use of a regular glidewire, a Kumpe catheter was advanced through the left femoral, external iliac vein and through the left common iliac vein to the level of the IVC.  Limited vena cavogram demonstrates complete occlusion of the distal aspect of the IVC extending to involve both left common iliac veins.  Ultimately, a moderate to long   segment apparently extrinsic malignant narrowing involving the mid/distal aspect of the IVC was successfully traversed with inferior venogram demonstrating patency of the more cranial infrarenal IVC.  Next, with the use of a Angiojet device, approximately 250 cc of  a solution of 10 mg of tPA mixed in 500 mg of saline was infused throughout the occluded segment of the IVC, left common and external iliac veins to the level of the left common femoral vein.  Attention was now paid towards access of the right lower extremity popliteal vein.  After the overlying soft tissues were anesthetized with 1% lidocaine, the right popliteal vein was accessed micropuncture kit. Limited venogram was performed through the outer micro puncture sheath. This allowed for placement of a 6 French, 35 cm vascular sheath.  Again, with the use of a regular glidewire, a Kumpe catheter was advanced beyond the obstructing caval lesion. Contrast injection confirmed appropriate positioning.  Next, after approximately 20 minute tPA dwell, mechanical thrombectomy was performed with the Angiojet device both from the left lower extremity access as well as the right lower extremity access.  Limited vena cavagram was performed via the right lower extremity access.  Kumpe catheter was utilized for measurement purposes and ultimately a 90 cm/20-cm infusion catheter was placed from the right lower extremity access and a 90 cm/30 cm infusion catheter was placed from the left lower extremity access adequately covering the occluded segment of the bilateral venous systems as well as the caudal aspect of the IVC.  Postprocedural spot fluoroscopic images were obtained.  Both popliteal vascular sheath were secured in place. Dressings were applied.  Patient's was subjectively cold by the end of the procedure with associated elevation in his blood pressure and as such he was administered 10 mg of hydralazine and bear hugger warming device was applied.  The patient otherwise tolerated the procedure well without immediate postprocedural complication.  FINDINGS: Sonographic evaluation demonstrates patency of the bilateral popliteal veins.  Contrast injection demonstrates patency of the bilateral popliteal and  superficial femoral veins.  There is occlusive thrombus extending from the left common femoral vein and the right common iliac vein through the mid/caudal aspect of the IVC secondary to a moderate to long segment length presumably extrinsic malignant occlusion secondary to known adjacent bulky retroperitoneal lymphadenopathy.  Following pharmacological and mechanical thrombectomy as detailed above, there is restored flow as demonstrated on limited right central pelvic venogram with large amount of residual thrombus.  Following infusion catheter placement, both side ports traverse both occluded segments of the IVC and pelvic venous systems.  IMPRESSION: 1. 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 2. Successful initiation of bilateral mechanical and pharmacologic thrombectomy with bilateral infusion catheters appropriately positioned.  PLAN: The patient will be transferred to ICU for overnight lytic infusion and will return tomorrow for repeat venogram and potential intervention.   Electronically Signed   By: John  Watts M.D.   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 Patient has experienced minimal improvement since initiation of IV heparin, and as such request made for initiation of catheter directed thrombolysis for symptomatic purposes. EXAM: 1. ULTRASOUND GUIDANCE FOR VASCULAR ACCESS X2 2. FLUOROSCOPIC GUIDED PHARMACOLOGICAL AND MECHANICAL THROMBECTOMY WITH ANGIOJET DEVICE   3. BILATERAL LOWER EXTREMITY VENOGRAM AND PLACEMENT OF INFUSION CATHETERS COMPARISON:  CT chest, abdomen pelvis-04/11/2019; abdominal MRV - 04/13/2019 MEDICATIONS:  Hydralazine 10 mg IV, administered near the completion of the procedure secondary to persistently elevated blood pressure. CONTRAST:  60 cc Omnipaque 300 ANESTHESIA/SEDATION: Moderate (conscious) sedation was employed during this procedure. A total of Versed 3 mg and Fentanyl 125 mcg was administered intravenously. Moderate Sedation Time: 67 minutes. The patient's level of consciousness and vital signs were monitored continuously by radiology nursing throughout the procedure under my direct supervision. FLUOROSCOPY TIME:  18 minutes, 18 seconds (311 mGy) COMPLICATIONS: None immediate. TECHNIQUE: Informed written consent was obtained from the patient after a discussion of the risks (including but not limited to bleeding, including nontarget bleeding given history of malignancy, infection, pulmonary embolism and necessity for venous angioplasty and/or stent placement), benefits and alternatives to treatment. Questions regarding the procedure were encouraged and answered. A timeout was performed prior to the initiation of the procedure. The patient was placed prone on the fluoroscopy table and the skin posterior to the bilateral knees was prepped and draped in the usual sterile fashion, and a sterile drape was applied covering the operative field. Maximum barrier sterile technique with sterile gowns and gloves were used for the procedure. Under direct ultrasound guidance, the left popliteal vein was access with a micropuncture kit after the overlying soft tissues were anesthetized with 1% lidocaine. An ultrasound image was saved for documentation purposed. This allowed for placement of a 8-French vascular sheath. A limited venogram was performed through the side arm of the vascular sheath. With the use of a regular glidewire, a Kumpe catheter was advanced through the left femoral, external iliac vein and through the left common iliac vein to the level of the IVC. Limited vena cavogram demonstrates complete occlusion of  the distal aspect of the IVC extending to involve both left common iliac veins. Ultimately, a moderate to long segment apparently extrinsic malignant narrowing involving the mid/distal aspect of the IVC was successfully traversed with inferior venogram demonstrating patency of the more cranial infrarenal IVC. Next, with the use of a Angiojet device, approximately 250 cc of a solution of 10 mg of tPA mixed in 500 mg of saline was infused throughout the occluded segment of the IVC, left common and external iliac veins to the level of the left common femoral vein. Attention was now paid towards access of the right lower extremity popliteal vein. After the overlying soft tissues were anesthetized with 1% lidocaine, the right popliteal vein was accessed micropuncture kit. Limited venogram was performed through the outer micro puncture sheath. This allowed for placement of a 6 French, 35 cm vascular sheath. Again, with the use of a regular glidewire, a Kumpe catheter was advanced beyond the obstructing caval lesion. Contrast injection confirmed appropriate positioning. Next, after approximately 20 minute tPA dwell, mechanical thrombectomy was performed with the Angiojet device both from the left lower extremity access as well as the right lower extremity access. Limited vena cavagram was performed via the right lower extremity access. Kumpe catheter was utilized for measurement purposes and ultimately a 90 cm/20-cm infusion catheter was placed from the right lower extremity access and a 90 cm/30 cm infusion catheter was placed from the left lower extremity access adequately covering the occluded segment of the bilateral venous systems as well as the caudal aspect of the IVC. Postprocedural spot fluoroscopic images were obtained. Both popliteal vascular sheath were secured in place. Dressings were applied. Patient's was subjectively cold   by the end of the procedure with associated elevation in his blood pressure and as  such he was administered 10 mg of hydralazine and bear hugger warming device was applied. The patient otherwise tolerated the procedure well without immediate postprocedural complication. FINDINGS: Sonographic evaluation demonstrates patency of the bilateral popliteal veins. Contrast injection demonstrates patency of the bilateral popliteal and superficial femoral veins. There is occlusive thrombus extending from the left common femoral vein and the right common iliac vein through the mid/caudal aspect of the IVC secondary to a moderate to long segment length presumably extrinsic malignant occlusion secondary to known adjacent bulky retroperitoneal lymphadenopathy. Following pharmacological and mechanical thrombectomy as detailed above, there is restored flow as demonstrated on limited right central pelvic venogram with large amount of residual thrombus. Following infusion catheter placement, both side ports traverse both occluded segments of the IVC and pelvic venous systems. IMPRESSION: 1. 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 2. Successful initiation of bilateral mechanical and pharmacologic thrombectomy with bilateral infusion catheters appropriately positioned. PLAN: The patient will be transferred to ICU for overnight lytic infusion and will return tomorrow for repeat venogram and potential intervention. Electronically Signed   By: John  Watts M.D.   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 Patient has experienced minimal improvement since initiation of IV heparin, and as such request made for initiation of catheter directed thrombolysis for  symptomatic purposes. EXAM: 1. ULTRASOUND GUIDANCE FOR VASCULAR ACCESS X2 2. FLUOROSCOPIC GUIDED PHARMACOLOGICAL AND MECHANICAL THROMBECTOMY WITH ANGIOJET DEVICE 3. BILATERAL LOWER EXTREMITY VENOGRAM AND PLACEMENT OF INFUSION CATHETERS COMPARISON:  CT chest, abdomen pelvis-04/11/2019; abdominal MRV - 04/13/2019 MEDICATIONS: Hydralazine 10 mg IV, administered near the completion of the procedure secondary to persistently elevated blood pressure. CONTRAST:  60 cc Omnipaque 300 ANESTHESIA/SEDATION: Moderate (conscious) sedation was employed during this procedure. A total of Versed 3 mg and Fentanyl 125 mcg was administered intravenously. Moderate Sedation Time: 67 minutes. The patient's level of consciousness and vital signs were monitored continuously by radiology nursing throughout the procedure under my direct supervision. FLUOROSCOPY TIME:  18 minutes, 18 seconds (311 mGy) COMPLICATIONS: None immediate. TECHNIQUE: Informed written consent was obtained from the patient after a discussion of the risks (including but not limited to bleeding, including nontarget bleeding given history of malignancy, infection, pulmonary embolism and necessity for venous angioplasty and/or stent placement), benefits and alternatives to treatment. Questions regarding the procedure were encouraged and answered. A timeout was performed prior to the initiation of the procedure. The patient was placed prone on the fluoroscopy table and the skin posterior to the bilateral knees was prepped and draped in the usual sterile fashion, and a sterile drape was applied covering the operative field. Maximum barrier sterile technique with sterile gowns and gloves were used for the procedure. Under direct ultrasound guidance, the left popliteal vein was access with a micropuncture kit after the overlying soft tissues were anesthetized with 1% lidocaine. An ultrasound image was saved for documentation purposed. This allowed for placement of a 8-French  vascular sheath. A limited venogram was performed through the side arm of the vascular sheath. With the use of a regular glidewire, a Kumpe catheter was advanced through the left femoral, external   iliac vein and through the left common iliac vein to the level of the IVC. Limited vena cavogram demonstrates complete occlusion of the distal aspect of the IVC extending to involve both left common iliac veins. Ultimately, a moderate to long segment apparently extrinsic malignant narrowing involving the mid/distal aspect of the IVC was successfully traversed with inferior venogram demonstrating patency of the more cranial infrarenal IVC. Next, with the use of a Angiojet device, approximately 250 cc of a solution of 10 mg of tPA mixed in 500 mg of saline was infused throughout the occluded segment of the IVC, left common and external iliac veins to the level of the left common femoral vein. Attention was now paid towards access of the right lower extremity popliteal vein. After the overlying soft tissues were anesthetized with 1% lidocaine, the right popliteal vein was accessed micropuncture kit. Limited venogram was performed through the outer micro puncture sheath. This allowed for placement of a 6 French, 35 cm vascular sheath. Again, with the use of a regular glidewire, a Kumpe catheter was advanced beyond the obstructing caval lesion. Contrast injection confirmed appropriate positioning. Next, after approximately 20 minute tPA dwell, mechanical thrombectomy was performed with the Angiojet device both from the left lower extremity access as well as the right lower extremity access. Limited vena cavagram was performed via the right lower extremity access. Kumpe catheter was utilized for measurement purposes and ultimately a 90 cm/20-cm infusion catheter was placed from the right lower extremity access and a 90 cm/30 cm infusion catheter was placed from the left lower extremity access adequately covering the occluded  segment of the bilateral venous systems as well as the caudal aspect of the IVC. Postprocedural spot fluoroscopic images were obtained. Both popliteal vascular sheath were secured in place. Dressings were applied. Patient's was subjectively cold by the end of the procedure with associated elevation in his blood pressure and as such he was administered 10 mg of hydralazine and bear hugger warming device was applied. The patient otherwise tolerated the procedure well without immediate postprocedural complication. FINDINGS: Sonographic evaluation demonstrates patency of the bilateral popliteal veins. Contrast injection demonstrates patency of the bilateral popliteal and superficial femoral veins. There is occlusive thrombus extending from the left common femoral vein and the right common iliac vein through the mid/caudal aspect of the IVC secondary to a moderate to long segment length presumably extrinsic malignant occlusion secondary to known adjacent bulky retroperitoneal lymphadenopathy. Following pharmacological and mechanical thrombectomy as detailed above, there is restored flow as demonstrated on limited right central pelvic venogram with large amount of residual thrombus. Following infusion catheter placement, both side ports traverse both occluded segments of the IVC and pelvic venous systems. IMPRESSION: 1. 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 2. Successful initiation of bilateral mechanical and pharmacologic thrombectomy with bilateral infusion catheters appropriately positioned. PLAN: The patient will be transferred to ICU for overnight lytic infusion and will return tomorrow for repeat venogram and potential intervention. Electronically Signed   By: John  Watts M.D.   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 Patient has experienced minimal improvement since initiation of IV heparin, and as such request made for initiation of catheter directed thrombolysis   for symptomatic purposes. EXAM: 1. ULTRASOUND GUIDANCE FOR VASCULAR ACCESS X2 2. FLUOROSCOPIC GUIDED PHARMACOLOGICAL AND MECHANICAL THROMBECTOMY WITH ANGIOJET DEVICE 3. BILATERAL LOWER EXTREMITY VENOGRAM AND PLACEMENT OF INFUSION CATHETERS COMPARISON:  CT chest, abdomen pelvis-04/11/2019; abdominal MRV - 04/13/2019 MEDICATIONS: Hydralazine 10 mg IV, administered near the completion of the procedure secondary to persistently elevated blood pressure. CONTRAST:  60 cc Omnipaque 300 ANESTHESIA/SEDATION: Moderate (conscious) sedation was employed during this procedure. A total of Versed 3 mg and Fentanyl 125 mcg was administered intravenously. Moderate Sedation Time: 67 minutes. The patient's level of consciousness and vital signs were monitored continuously by radiology nursing throughout the procedure under my direct supervision. FLUOROSCOPY TIME:  18 minutes, 18 seconds (841 mGy) COMPLICATIONS: None immediate. TECHNIQUE: Informed written consent was obtained from the patient after a discussion of the risks (including but not limited to bleeding, including nontarget bleeding given history of malignancy, infection, pulmonary embolism and necessity for venous angioplasty and/or stent placement), benefits and alternatives to treatment. Questions regarding the procedure were encouraged and answered. A timeout was performed prior to the initiation of the procedure. The patient was placed prone on the fluoroscopy table and the skin posterior to the bilateral knees was prepped and draped in the usual sterile fashion, and a sterile drape was applied covering the operative field. Maximum barrier sterile technique  with sterile gowns and gloves were used for the procedure. Under direct ultrasound guidance, the left popliteal vein was access with a micropuncture kit after the overlying soft tissues were anesthetized with 1% lidocaine. An ultrasound image was saved for documentation purposed. This allowed for placement of a 8-French vascular sheath. A limited venogram was performed through the side arm of the vascular sheath. With the use of a regular glidewire, a Kumpe catheter was advanced through the left femoral, external iliac vein and through the left common iliac vein to the level of the IVC. Limited vena cavogram demonstrates complete occlusion of the distal aspect of the IVC extending to involve both left common iliac veins. Ultimately, a moderate to long segment apparently extrinsic malignant narrowing involving the mid/distal aspect of the IVC was successfully traversed with inferior venogram demonstrating patency of the more cranial infrarenal IVC. Next, with the use of a Angiojet device, approximately 250 cc of a solution of 10 mg of tPA mixed in 500 mg of saline was infused throughout the occluded segment of the IVC, left common and external iliac veins to the level of the left common femoral vein. Attention was now paid towards access of the right lower extremity popliteal vein. After the overlying soft tissues were anesthetized with 1% lidocaine, the right popliteal vein was accessed micropuncture kit. Limited venogram was performed through the outer micro puncture sheath. This allowed for placement of a 6 French, 35 cm vascular sheath. Again, with the use of a regular glidewire, a Kumpe catheter was advanced beyond the obstructing caval lesion. Contrast injection confirmed appropriate positioning. Next, after approximately 20 minute tPA dwell, mechanical thrombectomy was performed with the Angiojet device both from the left lower extremity access as well as the right lower extremity access. Limited vena cavagram  was performed via the right lower extremity access. Kumpe catheter was utilized for measurement purposes and ultimately a 90 cm/20-cm infusion catheter was placed from the right lower extremity access and a 90 cm/30 cm infusion catheter was placed from the left lower extremity access adequately covering the occluded segment of the bilateral venous systems as well as the caudal aspect of the IVC.  Postprocedural spot fluoroscopic images were obtained. Both popliteal vascular sheath were secured in place. Dressings were applied. Patient's was subjectively cold by the end of the procedure with associated elevation in his blood pressure and as such he was administered 10 mg of hydralazine and bear hugger warming device was applied. The patient otherwise tolerated the procedure well without immediate postprocedural complication. FINDINGS: Sonographic evaluation demonstrates patency of the bilateral popliteal veins. Contrast injection demonstrates patency of the bilateral popliteal and superficial femoral veins. There is occlusive thrombus extending from the left common femoral vein and the right common iliac vein through the mid/caudal aspect of the IVC secondary to a moderate to long segment length presumably extrinsic malignant occlusion secondary to known adjacent bulky retroperitoneal lymphadenopathy. Following pharmacological and mechanical thrombectomy as detailed above, there is restored flow as demonstrated on limited right central pelvic venogram with large amount of residual thrombus. Following infusion catheter placement, both side ports traverse both occluded segments of the IVC and pelvic venous systems. IMPRESSION: 1. 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 2. Successful initiation of bilateral mechanical and pharmacologic thrombectomy with bilateral infusion catheters appropriately  positioned. PLAN: The patient will be transferred to ICU for overnight lytic infusion and will return tomorrow for repeat venogram and potential intervention. Electronically Signed   By: John  Watts M.D.   On: 04/15/2019 17:04   CT BIOPSY  Result Date: 04/12/2019 INDICATION: 38-year-old male with a history of testicular cancer previously treated. He presents with new retroperitoneal adenopathy EXAM: CT BIOPSY MEDICATIONS: None. ANESTHESIA/SEDATION: Moderate (conscious) sedation was employed during this procedure. A total of Versed 2.0 mg and Fentanyl 100 mcg was administered intravenously. Moderate Sedation Time: 14 minutes. The patient's level of consciousness and vital signs were monitored continuously by radiology nursing throughout the procedure under my direct supervision. FLUOROSCOPY TIME:  CT COMPLICATIONS: None PROCEDURE: Informed written consent was obtained from the patient after a thorough discussion of the procedural risks, benefits and alternatives. All questions were addressed. Maximal Sterile Barrier Technique was utilized including caps, mask, sterile gowns, sterile gloves, sterile drape, hand hygiene and skin antiseptic. A timeout was performed prior to the initiation of the procedure. Patient positioned prone position on CT gantry table. Scout CT acquired for planning purposes. The patient was then prepped and draped in the usual sterile fashion. 1% lidocaine was used for local anesthesia. Introducer needle was then placed targeting the left-sided retroperitoneal lymphadenopathy. Once we confirmed needle tip position, multiple 18 gauge core biopsy were acquired. Specimen placed in the saline as S fresh specimen. Needle was removed and a final CT was acquired. Patient tolerated the procedure well and remained hemodynamically stable throughout. No complications were encountered and no significant blood loss. IMPRESSION: Status post CT-guided biopsy of left retroperitoneal lymphadenopathy. Signed,  Jaime S. Wagner, DO, RPVI [facility] Electronically Signed   By: Jaime  Wagner D.O.   On: 04/12/2019 12:56   DG Chest Port 1 View  Result Date: 04/24/2019 CLINICAL DATA:  38-year-old male status post PICC placement. EXAM: PORTABLE CHEST 1 VIEW COMPARISON:  Chest radiograph dated 04/18/2019. FINDINGS: Left-sided PICC with tip over the right atrium close to the cavoatrial junction. Faint bilateral lower lung field densities may represent atelectasis or atypical infection. Clinical correlation recommended. There is no pleural effusion or pneumothorax. The cardiac silhouette is within normal limits. No acute osseous pathology. IMPRESSION: 1. Left-sided   PICC with tip over the right atrium. 2. Faint bilateral lower lung field atelectasis or developing infiltrate. Electronically Signed   By: Arash  Radparvar M.D.   On: 04/24/2019 18:07   DG Chest Port 1 View  Result Date: 04/18/2019 CLINICAL DATA:  Fever, testicular cancer EXAM: PORTABLE CHEST 1 VIEW COMPARISON:  None. FINDINGS: Normal heart size. Normal mediastinal contour. No pneumothorax. No pleural effusion. Slightly low lung volumes. Minimal streaky opacities at both lung bases. No pulmonary edema. IMPRESSION: Slightly low lung volumes. Minimal streaky opacities at the lung bases, favoring minimal atelectasis. No consolidative airspace disease to suggest a pneumonia. Electronically Signed   By: Jason A Poff M.D.   On: 04/18/2019 15:41   DG Abd Portable 1V  Result Date: 04/18/2019 CLINICAL DATA:  Abdominal pain and distension EXAM: PORTABLE ABDOMEN - 1 VIEW COMPARISON:  04/11/2019 FINDINGS: Supine frontal view of the abdomen and pelvis excludes the hemidiaphragms, right flank, and lower pelvis by collimation. There is diffuse gaseous distention of the colon, likely reflecting postoperative ileus. Stable position of the IVC stent. No masses or abnormal calcifications. IMPRESSION: 1. Distended  gas-filled colon consistent with postprocedural ileus. 2. IVC stent as above Electronically Signed   By: Michael  Brown M.D.   On: 04/18/2019 08:41   ECHOCARDIOGRAM COMPLETE  Result Date: 04/19/2019   ECHOCARDIOGRAM REPORT   Patient Name:   Crisanto Bjelland Date of Exam: 04/19/2019 Medical Rec #:  [id number]      Height:       75.0 in Accession #:    [id number]     Weight:       216.9 lb Date of Birth:  11/29/1980       BSA:          2.27 m Patient Age:    38 years       BP:           132/83 mmHg Patient Gender: M              HR:           125 bpm. Exam Location:  Inpatient Procedure: 2D Echo, Color Doppler and Cardiac Doppler                          MODIFIED REPORT: This report was modified by Gayatri Acharya MD on 04/19/2019 due to.  Indications:     Fever  History:         Patient has no prior history of Echocardiogram examinations.                  Testicular cancer; Signs/Symptoms:Fever.  Sonographer:     Lauren Pennington Referring Phys:  [id number] ELIZABETH G MATHEWS Diagnosing Phys: Gayatri Acharya MD  Sonographer Comments: Suboptimal apical window. IMPRESSIONS  1. Left ventricular ejection fraction, by visual estimation, is 60 to 65%. The left ventricle has normal function. Mildly increased left ventricular posterior wall thickness. There is mildly increased left ventricular hypertrophy.  2. Global right ventricle has normal systolic function.The right ventricular size is normal. Right vetricular wall thickness was not assessed.  3. The mitral valve is normal in structure. No evidence of mitral valve regurgitation. No evidence of mitral stenosis.  4. The tricuspid valve is normal in structure. Tricuspid valve regurgitation is not demonstrated.  5. The aortic valve is normal in structure. Aortic valve regurgitation is not visualized. No evidence of aortic valve sclerosis or stenosis.  6. The inferior vena cava is normal in   size with greater than 50% respiratory variability, suggesting right atrial pressure of 3  mmHg.  7. No evidence of valvular vegetations on this transthoracic echocardiogram, however acoustic windows are suboptimal. FINDINGS  Left Ventricle: Left ventricular ejection fraction, by visual estimation, is 60 to 65%. The left ventricle has normal function. The left ventricle is not well visualized. Mildly increased left ventricular posterior wall thickness. There is mildly increased left ventricular hypertrophy. Left ventricular diastolic parameters were normal. Normal left atrial pressure. Right Ventricle: The right ventricular size is normal. Right vetricular wall thickness was not assessed. Global RV systolic function is has normal systolic function. Left Atrium: Left atrial size was normal in size. Right Atrium: Right atrial size was normal in size Pericardium: There is no evidence of pericardial effusion. Mitral Valve: The mitral valve is normal in structure. No evidence of mitral valve regurgitation. No evidence of mitral valve stenosis by observation. Tricuspid Valve: The tricuspid valve is normal in structure. Tricuspid valve regurgitation is not demonstrated. Aortic Valve: The aortic valve is normal in structure. Aortic valve regurgitation is not visualized. The aortic valve is structurally normal, with no evidence of sclerosis or stenosis. Pulmonic Valve: The pulmonic valve was normal in structure. Pulmonic valve regurgitation is not visualized. Pulmonic regurgitation is not visualized. Aorta: The aortic root is normal in size and structure. Venous: The inferior vena cava is normal in size with greater than 50% respiratory variability, suggesting right atrial pressure of 3 mmHg. IAS/Shunts: The interatrial septum was not well visualized. Additional Comments: No evidence of valvular vegetations on this transthoracic echocardiogram. Would recommend a transesophageal echocardiogram to exclude infective endocarditis if clinically indicated.  LEFT VENTRICLE PLAX 2D LVIDd:         5.00 cm  Diastology  LVIDs:         3.10 cm  LV e' lateral: 14.10 cm/s LV PW:         1.10 cm  LV e' medial:  12.30 cm/s LV IVS:        0.80 cm LVOT diam:     2.40 cm LV SV:         80 ml LV SV Index:   35.02 LVOT Area:     4.52 cm  RIGHT VENTRICLE RV S prime:     27.10 cm/s TAPSE (M-mode): 2.8 cm LEFT ATRIUM           Index LA diam:      3.40 cm 1.50 cm/m LA Vol (A4C): 38.8 ml 17.08 ml/m  AORTIC VALVE LVOT Vmax:   105.00 cm/s LVOT Vmean:  74.200 cm/s LVOT VTI:    0.167 m  AORTA Ao Root diam: 3.60 cm  SHUNTS Systemic VTI:  0.17 m Systemic Diam: 2.40 cm  Gayatri Acharya MD Electronically signed by Gayatri Acharya MD Signature Date/Time: 04/19/2019/7:59:29 PM    Final (Updated)    IR TRANSCATH PLC STENT 1ST ART NOT LE CV CAR VERT CAR  Result Date: 04/16/2019 INDICATION: History of newly diagnosed recurrent testicular cancer, now with bulky 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 Patient was initiated bilateral pelvic/IVC catheter directed thrombolysis day prior (04/15/2019), and returns today following 24 hours of tPA infusion. EXAM: 1. IR THROMB F/U EVAL ART/VEN FINAL DAY 2. FLUOROSCOPIC GUIDED ANGIOPLASTY AND STENT PLACEMENT OF THE IVC COMPARISON:  Initiation of bilateral catheter directed pelvic venous and IVC thrombolysis - 04/15/2019; CT abdomen pelvis - 04/11/2019 MEDICATIONS: Heparin 7000 units CONTRAST:  85 cc Omnipaque   300 ANESTHESIA/SEDATION: Moderate (conscious) sedation was employed during this procedure. A total of Versed 2 mg and Dilaudid 3 mg was administered intravenously. Moderate Sedation Time: 128 minutes. The patient's level of consciousness and vital signs were monitored continuously by radiology nursing throughout the procedure under my direct supervision. FLUOROSCOPY TIME:  14 minutes, 12 seconds (867.6 mGy) COMPLICATIONS: None immediate. TECHNIQUE: Informed written consent was obtained from the patient after a discussion of  the risks, benefits and alternatives to treatment. Questions regarding the procedure were encouraged and answered. A timeout was performed prior to the initiation of the procedure. The patient was placed prone on the fluoroscopy table and the skin posterior to the bilateral knees as well as the external portion the existing bilateral vascular sheaths and infusion catheters was prepped and draped in usual sterile fashion. The surrounding subcutaneous tissues about both popliteal approach vascular sheath was anesthetized with 1% lidocaine with epinephrine. Next, post lysis venograms were performed from the bilateral infusion catheters following removal of the bilateral infusion wires. Images were reviewed and the decision was made to proceed with additional mechanical thrombectomy. With the use of a vertebral catheter exchange length stiff Glidewires were advanced from both existing popliteal vascular sheaths superior to the nearly obstructing caval lesion. Contrast injection confirmed appropriate positioning. Stiff glide wires were advanced into the left subclavian vein. 4000 units of heparin was administered intravenously. Next, beginning with the left lower extremity, the existing 8 French vascular sheath was exchanged over the glidewire for a 13 [place] vascular sheath. The 16 mm diameter Clottreiever Inari mechanical thrombectomy device was then utilized to perform 3 passes from the left popliteal approach from at and just below level of the obstructing caval lesion. Post thrombectomy images were obtained from the left lower extremity. Next, the existing right lower extremity 6 French popliteal vein vascular sheath was exchanged over a guidewire for a 13 French vascular sheath. Again, from the right lower extremity approach, the 16 mm diameter Clottreiever Inari mechanical thrombectomy device was then utilized to perform 3 passes from the level at and just below the obstructing caval lesion. Post thrombectomy  images were obtained from the right lower extremity. An ACT level was obtained and an additional 3000 units of heparin was administered intravenously. The long segment eccentric irregular narrowing/subtotal occlusion of the IVC was subsequently balloon angioplastied at multiple stations with a 14 mm x 4 cm Atlas balloon. Despite adequate balloon apposition, post angioplasty venogram images demonstrate a recurrent hemodynamically significant narrowing. As such, a 20 mm x 60 mm Venovo venous stent was deployed across the superior and mid aspect of the caval lesion and was subsequently balloon angioplastied at multiple stations initially with a 16 mm x 4 cm atlas balloon and ultimately with an 18 mm x 4 cm balloon. Post stent deployment venogram images were obtained however there is felt to be incomplete coverage of the distal aspect of the malignant narrowing. As such, an additional 20 mm x 40 mm Venovo venous stent was deployed overlapping the mid/caudal aspect of venous stent and adequately covering the caudal aspect caval lesion. Both overlapping venous stents were subsequently balloon angioplastied at multiple stations with the 18 mm atlas balloon and completion venogram images were obtained from both lower extremities. Images were reviewed and the procedure was terminated. All wires, catheters and sheaths were removed from the bilateral popliteal vein accesses and hemostasis was achieved with manual compression. Dressings applied. The patient tolerated the procedure well without immediate postprocedural complication. FINDINGS: Initial post lysis  venogram performed from the left lower extremity infusion catheter demonstrates restoration of flow through the left pelvis with moderate amount residual thrombus primarily within the peripheral aspect of the left external iliac vein as well as the left common and central aspect of the left superficial femoral veins. There is passage of contrast beyond the nearly  obstructing lesion involving mid/caudal aspect of the IVC. Initial post lysis venogram performed from the right lower extremity demonstrates persistent occlusion at the level of the right common iliac vein. Following 3 passes from each lower extremity with the 16 mm diameter Clottreiever Inari mechanical thrombectomy device, nearly all residual clot burden within the pelvis was removed. 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 This lesion was again noted to be significantly caudal to the confluence of the bilateral renal veins. As such, nearly obstructing caval lesion was treated with overlapping stent deployment of 20 mm diameter Venovo venous stents which were subsequently balloon angioplastied to 18 mm diameter. Completion images demonstrate a technically excellent result with restored brisk flow through the bilateral pelvic systems and the IVC. There is a mild residual narrowing at the level of the mid/caudal aspect of the IVC, though not felt to result in a residual hemodynamically significant stenosis. No evidence of complication. Specifically, no evidence of contrast extravasation or vessel dissection. 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 PLAN: - Maintain IV heparin drip until patient is successfully converted to long-term anticoagulant. - Recommend maintaining SCD devices while in bed. Electronically Signed   By: Sandi Mariscal M.D.   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 EXAM: MRI ABDOMEN WITHOUT AND WITH CONTRAST TECHNIQUE: Multiplanar multisequence MR imaging of the abdomen was performed both before and after the administration of intravenous contrast. CONTRAST:  6m GADAVIST GADOBUTROL 1 MMOL/ML IV SOLN COMPARISON:  CT 04/11/2019 FINDINGS: Vascular: Arterial: Unremarkable course caliber and contour of the visualized abdominal aorta. Unremarkable course caliber and contour of the bilateral iliac arteries and proximal femoral arteries. Bilateral hypogastric arteries are patent. Venous: Right iliac/femoral system: Flow signal is maintained within the visualized proximal femoral veins. Flow signal maintained into the external iliac vein. Absent flow signal with evidence of thrombus involving the proximal right common iliac vein to the confluence and into the IVC. Expansion of the right common iliac vein. Multiple pelvic collateral vessels. Left iliac/femoral system: Absent flow signal through the length of the left iliac venous system extending through the femoral vein. Expansion of the left iliac and femoral venous system. Multiple pelvic collateral veins. IVC: The IVC is not well evaluated at the level of the adenopathy. Absent flow signal above the confluence of the iliac veins compatible with ilio caval thrombus. The juxtarenal IVC is patent with maintained flow signal and maintain flow signal of the bilateral renal veins. Nonvascular: Unremarkable musculoskeletal structures. Partially distended urinary bladder. Unremarkable appearance of the visualized kidneys and GI system. Edema of the lower abdominal wall/pelvic body wall, as well as edema of the left proximal lower extremity. IMPRESSION: The MR confirms ilio caval thrombus  involving the lower IVC, bilateral common iliac vein, and the left external iliac vein and proximal left femoral system. Pathologic  lymphadenopathy of the retroperitoneum, similar to comparison CT, contributing to external compression of the infrarenal IVC. The current MR is nondiagnostic to confirm or rule out tumor thrombus. Lower abdominal/pelvic wall edema including edema of the proximal left lower extremity. Signed, Jaime S. Wagner, DO, RPVI [facility] Electronically Signed   By: Jaime  Wagner D.O.   On: 04/13/2019 14:14   VAS US LOWER EXTREMITY VENOUS (DVT)  Result Date: 04/11/2019  Lower Venous Study Indications: Swelling.  Risk Factors: None identified. Anticoagulation: Heparin. Limitations: Poor ultrasound/tissue interface and bowel gas. Comparison Study: No prior studies. Performing Technologist: Gregory Collins RVT  Examination Guidelines: A complete evaluation includes B-mode imaging, spectral Doppler, color Doppler, and power Doppler as needed of all accessible portions of each vessel. Bilateral testing is considered an integral part of a complete examination. Limited examinations for reoccurring indications may be performed as noted.  +---------+---------------+---------+-----------+----------+--------------+ RIGHT    CompressibilityPhasicitySpontaneityPropertiesThrombus Aging +---------+---------------+---------+-----------+----------+--------------+ CFV      Full           Yes      Yes                                 +---------+---------------+---------+-----------+----------+--------------+ SFJ      Full                                                        +---------+---------------+---------+-----------+----------+--------------+ FV Prox  Full                                                        +---------+---------------+---------+-----------+----------+--------------+ FV Mid   Full                                                        +---------+---------------+---------+-----------+----------+--------------+ FV  DistalFull                                                        +---------+---------------+---------+-----------+----------+--------------+ PFV      Full                                                        +---------+---------------+---------+-----------+----------+--------------+ POP      Full           Yes      Yes                                 +---------+---------------+---------+-----------+----------+--------------+   PTV      Full                                                        +---------+---------------+---------+-----------+----------+--------------+ PERO     Full                                                        +---------+---------------+---------+-----------+----------+--------------+   +---------+---------------+---------+-----------+----------+--------------+ LEFT     CompressibilityPhasicitySpontaneityPropertiesThrombus Aging +---------+---------------+---------+-----------+----------+--------------+ CFV      None           No       No                   Acute          +---------+---------------+---------+-----------+----------+--------------+ SFJ      None                                         Acute          +---------+---------------+---------+-----------+----------+--------------+ FV Prox  None           No       No                   Acute          +---------+---------------+---------+-----------+----------+--------------+ FV Mid   None           No       No                   Acute          +---------+---------------+---------+-----------+----------+--------------+ FV DistalNone           No       No                   Acute          +---------+---------------+---------+-----------+----------+--------------+ POP      Partial        No       No                   Acute          +---------+---------------+---------+-----------+----------+--------------+ PTV      Full                                                         +---------+---------------+---------+-----------+----------+--------------+ PERO     None                                         Acute          +---------+---------------+---------+-----------+----------+--------------+ EIV      None           No       No                     Acute          +---------+---------------+---------+-----------+----------+--------------+ Unable to visualize CIV, or IVC due to bowel gas.    Summary: Right: There is no evidence of deep vein thrombosis in the lower extremity. No cystic structure found in the popliteal fossa. Left: Findings consistent with acute deep vein thrombosis involving the left external iliac vein, common femoral vein, left femoral vein, left popliteal vein, and left peroneal veins. No cystic structure found in the popliteal fossa. Unable to visualize CIV, or IVC due to bowel gas.  *See table(s) above for measurements and observations. Electronically signed by Christopher Dickson MD on 04/11/2019 at 4:09:39 PM.    Final    US EKG SITE RITE  Result Date: 04/24/2019 If Site Rite image not attached, placement could not be confirmed due to current cardiac rhythm.  US EKG SITE RITE  Result Date: 04/21/2019 If Site Rite image not attached, placement could not be confirmed due to current cardiac rhythm.  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 Patient has experienced minimal improvement since initiation of IV heparin, and as such request made for initiation of catheter directed thrombolysis for symptomatic purposes. EXAM: 1. ULTRASOUND GUIDANCE FOR VASCULAR ACCESS X2 2. FLUOROSCOPIC GUIDED PHARMACOLOGICAL AND MECHANICAL THROMBECTOMY WITH ANGIOJET DEVICE 3. BILATERAL LOWER EXTREMITY VENOGRAM AND  PLACEMENT OF INFUSION CATHETERS COMPARISON:  CT chest, abdomen pelvis-04/11/2019; abdominal MRV - 04/13/2019 MEDICATIONS: Hydralazine 10 mg IV, administered near the completion of the procedure secondary to persistently elevated blood pressure. CONTRAST:  60 cc Omnipaque 300 ANESTHESIA/SEDATION: Moderate (conscious) sedation was employed during this procedure. A total of Versed 3 mg and Fentanyl 125 mcg was administered intravenously. Moderate Sedation Time: 67 minutes. The patient's level of consciousness and vital signs were monitored continuously by radiology nursing throughout the procedure under my direct supervision. FLUOROSCOPY TIME:  18 minutes, 18 seconds (311 mGy) COMPLICATIONS: None immediate. TECHNIQUE: Informed written consent was obtained from the patient after a discussion of the risks (including but not limited to bleeding, including nontarget bleeding given history of malignancy, infection, pulmonary embolism and necessity for venous angioplasty and/or stent placement), benefits and alternatives to treatment. Questions regarding the procedure were encouraged and answered. A timeout was performed prior to the initiation of the procedure. The patient was placed prone on the fluoroscopy table and the skin posterior to the bilateral knees was prepped and draped in the usual sterile fashion, and a sterile drape was applied covering the operative field. Maximum barrier sterile technique with sterile gowns and gloves were used for the procedure. Under direct ultrasound guidance, the left popliteal vein was access with a micropuncture kit after the overlying soft tissues were anesthetized with 1% lidocaine. An ultrasound image was saved for documentation purposed. This allowed for placement of a 8-French vascular sheath. A limited venogram was performed through the side arm of the vascular sheath. With the use of a regular glidewire, a Kumpe catheter was advanced through the left femoral, external iliac  vein and through the left common iliac vein to the level of the IVC. Limited vena cavogram demonstrates complete occlusion of the distal aspect of the IVC extending to involve both left common iliac veins. Ultimately, a moderate to long segment apparently extrinsic malignant narrowing involving the mid/distal aspect of the IVC was successfully traversed with inferior venogram demonstrating patency of the more   cranial infrarenal IVC. Next, with the use of a Angiojet device, approximately 250 cc of a solution of 10 mg of tPA mixed in 500 mg of saline was infused throughout the occluded segment of the IVC, left common and external iliac veins to the level of the left common femoral vein. Attention was now paid towards access of the right lower extremity popliteal vein. After the overlying soft tissues were anesthetized with 1% lidocaine, the right popliteal vein was accessed micropuncture kit. Limited venogram was performed through the outer micro puncture sheath. This allowed for placement of a 6 French, 35 cm vascular sheath. Again, with the use of a regular glidewire, a Kumpe catheter was advanced beyond the obstructing caval lesion. Contrast injection confirmed appropriate positioning. Next, after approximately 20 minute tPA dwell, mechanical thrombectomy was performed with the Angiojet device both from the left lower extremity access as well as the right lower extremity access. Limited vena cavagram was performed via the right lower extremity access. Kumpe catheter was utilized for measurement purposes and ultimately a 90 cm/20-cm infusion catheter was placed from the right lower extremity access and a 90 cm/30 cm infusion catheter was placed from the left lower extremity access adequately covering the occluded segment of the bilateral venous systems as well as the caudal aspect of the IVC. Postprocedural spot fluoroscopic images were obtained. Both popliteal vascular sheath were secured in place. Dressings were  applied. Patient's was subjectively cold by the end of the procedure with associated elevation in his blood pressure and as such he was administered 10 mg of hydralazine and bear hugger warming device was applied. The patient otherwise tolerated the procedure well without immediate postprocedural complication. FINDINGS: Sonographic evaluation demonstrates patency of the bilateral popliteal veins. Contrast injection demonstrates patency of the bilateral popliteal and superficial femoral veins. There is occlusive thrombus extending from the left common femoral vein and the right common iliac vein through the mid/caudal aspect of the IVC secondary to a moderate to long segment length presumably extrinsic malignant occlusion secondary to known adjacent bulky retroperitoneal lymphadenopathy. Following pharmacological and mechanical thrombectomy as detailed above, there is restored flow as demonstrated on limited right central pelvic venogram with large amount of residual thrombus. Following infusion catheter placement, both side ports traverse both occluded segments of the IVC and pelvic venous systems. IMPRESSION: 1. 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 2. Successful initiation of bilateral mechanical and pharmacologic thrombectomy with bilateral infusion catheters appropriately positioned. PLAN: The patient will be transferred to ICU for overnight lytic infusion and will return tomorrow for repeat venogram and potential intervention. Electronically Signed   By: John  Watts M.D.   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 Patient was initiated  bilateral pelvic/IVC catheter directed thrombolysis day prior (04/15/2019), and returns today following 24 hours of tPA infusion. EXAM: 1. IR THROMB F/U EVAL ART/VEN FINAL DAY 2. FLUOROSCOPIC GUIDED ANGIOPLASTY AND STENT PLACEMENT OF THE IVC COMPARISON:  Initiation of bilateral catheter directed pelvic venous and IVC thrombolysis - 04/15/2019; CT abdomen pelvis - 04/11/2019 MEDICATIONS: Heparin 7000 units CONTRAST:  85 cc Omnipaque 300 ANESTHESIA/SEDATION: Moderate (conscious) sedation was employed during this procedure. A total of Versed 2 mg   and Dilaudid 3 mg was administered intravenously. Moderate Sedation Time: 128 minutes. The patient's level of consciousness and vital signs were monitored continuously by radiology nursing throughout the procedure under my direct supervision. FLUOROSCOPY TIME:  14 minutes, 12 seconds (116.6 mGy) COMPLICATIONS: None immediate. TECHNIQUE: Informed written consent was obtained from the patient after a discussion of the risks, benefits and alternatives to treatment. Questions regarding the procedure were encouraged and answered. A timeout was performed prior to the initiation of the procedure. The patient was placed prone on the fluoroscopy table and the skin posterior to the bilateral knees as well as the external portion the existing bilateral vascular sheaths and infusion catheters was prepped and draped in usual sterile fashion. The surrounding subcutaneous tissues about both popliteal approach vascular sheath was anesthetized with 1% lidocaine with epinephrine. Next, post lysis venograms were performed from the bilateral infusion catheters following removal of the bilateral infusion wires. Images were reviewed and the decision was made to proceed with additional mechanical thrombectomy. With the use of a vertebral catheter exchange length stiff Glidewires were advanced from both existing popliteal vascular sheaths superior to the nearly obstructing caval lesion. Contrast  injection confirmed appropriate positioning. Stiff glide wires were advanced into the left subclavian vein. 4000 units of heparin was administered intravenously. Next, beginning with the left lower extremity, the existing 8 French vascular sheath was exchanged over the glidewire for a 13 French vascular sheath. The 16 mm diameter Clottreiever Inari mechanical thrombectomy device was then utilized to perform 3 passes from the left popliteal approach from at and just below level of the obstructing caval lesion. Post thrombectomy images were obtained from the left lower extremity. Next, the existing right lower extremity 6 French popliteal vein vascular sheath was exchanged over a guidewire for a 13 French vascular sheath. Again, from the right lower extremity approach, the 16 mm diameter Clottreiever Inari mechanical thrombectomy device was then utilized to perform 3 passes from the level at and just below the obstructing caval lesion. Post thrombectomy images were obtained from the right lower extremity. An ACT level was obtained and an additional 3000 units of heparin was administered intravenously. The long segment eccentric irregular narrowing/subtotal occlusion of the IVC was subsequently balloon angioplastied at multiple stations with a 14 mm x 4 cm Atlas balloon. Despite adequate balloon apposition, post angioplasty venogram images demonstrate a recurrent hemodynamically significant narrowing. As such, a 20 mm x 60 mm Venovo venous stent was deployed across the superior and mid aspect of the caval lesion and was subsequently balloon angioplastied at multiple stations initially with a 16 mm x 4 cm atlas balloon and ultimately with an 18 mm x 4 cm balloon. Post stent deployment venogram images were obtained however there is felt to be incomplete coverage of the distal aspect of the malignant narrowing. As such, an additional 20 mm x 40 mm Venovo venous stent was deployed overlapping the mid/caudal aspect of  venous stent and adequately covering the caudal aspect caval lesion. Both overlapping venous stents were subsequently balloon angioplastied at multiple stations with the 18 mm atlas balloon and completion venogram images were obtained from both lower extremities. Images were reviewed and the procedure was terminated. All wires, catheters and sheaths were removed from the bilateral popliteal vein accesses and hemostasis was achieved with manual compression. Dressings applied. The patient tolerated the procedure well without immediate postprocedural complication. FINDINGS: Initial post lysis venogram performed from the left lower extremity infusion catheter demonstrates restoration of flow through the left   pelvis with moderate amount residual thrombus primarily within the peripheral aspect of the left external iliac vein as well as the left common and central aspect of the left superficial femoral veins. There is passage of contrast beyond the nearly obstructing lesion involving mid/caudal aspect of the IVC. Initial post lysis venogram performed from the right lower extremity demonstrates persistent occlusion at the level of the right common iliac vein. Following 3 passes from each lower extremity with the 16 mm diameter Clottreiever Inari mechanical thrombectomy device, nearly all residual clot burden within the pelvis was removed. 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 This lesion was again noted to be significantly caudal to the confluence of the bilateral renal veins. As such, nearly obstructing caval lesion was treated with overlapping stent deployment of 20 mm diameter Venovo venous stents which were subsequently balloon angioplastied to 18 mm diameter. Completion  images demonstrate a technically excellent result with restored brisk flow through the bilateral pelvic systems and the IVC. There is a mild residual narrowing at the level of the mid/caudal aspect of the IVC, though not felt to result in a residual hemodynamically significant stenosis. No evidence of complication. Specifically, no evidence of contrast extravasation or vessel dissection. 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 PLAN: - Maintain IV heparin drip until patient is successfully converted to long-term anticoagulant. - Recommend maintaining SCD devices while in bed. Electronically Signed   By: John  Watts M.D.   On: 04/16/2019 16:26    ASSESSMENT AND PLAN:  1.  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  extensive Left lower extremity DVT, s/p thrombolysis and IVC stent placement 4. Fever with unknown origin 5. Ilius, improving  6. Worsening anemia, s/p one unit blood 2/7 7.deconditioning  8.  History of stage Ib seminoma testicular cancer diagnosed in 2012, status post right orchiectomy-no adjuvant chemotherapy given  Recommendations: -Had a low-grade fever up to 100.2 yesterday, but no chills.  Cultures remain negative.  White blood cell count is trending downward.  Would recommend continuing IV antibiotics for now.  Fever could be related to his underlying cancer or hematoma. -His ileus is improving, he is tolerating liquid without any issue, he is also on nutritional supplement, he is having bowel movement a few times a day.  -Labs from today reviewed.  Magnesium is pending.  Recommend for him to proceed with day 2 of cycle one of his chemotherapy today as scheduled.  Determine if he needs these meds IV fluids once magnesium level results. -Creatinine improved.  Will monitor  closely.  -I will f/u daily   Kristin Curcio  04/25/2019 .   Addendum  I have seen the patient. I agree with the assessment and and plan and have edited the notes.   Pt tolerated first day chemo well yesterday, he had mild dyspnea and palpitation with Etoposide infusion, resolved after lowered the infusion rate. Lab reviewed, adequate for treatment, will proceed da 2 chemo, will give K in hydration fluids, no need mag supplement. Will watch I/O, and give lasix if needed. Plan to complete chemo on Friday.   Yan [name]  04/25/2019    

## 2019-04-25 NOTE — Progress Notes (Signed)
Ok to proceed with chemo today.  Will take Mag out of hydration fluids and f/u mag results today which are pending.  Can replace mag as needed later today.

## 2019-04-25 NOTE — Progress Notes (Signed)
Patient currently has foley catheter with 40cc of amber urine.  Dr. Burr Medico notified of patient's decreased urine output.  Last urine output documented was at 0455 and 250cc. Spoke with Jackelyn Poling, RN as well as Risa Grill, NT and neither have emptied urine during this shift.  Dr. Burr Medico stated to proceed with chemotherapy.

## 2019-04-25 NOTE — Progress Notes (Signed)
Reduced the fluid vol for Etoposide since pt has 3-4+ edema per chemo RN.  Kennith Center, Pharm.D., CPP 04/25/2019@10 :48 AM

## 2019-04-25 NOTE — Progress Notes (Signed)
Inpatient Rehabilitation-Admissions Coordinator   Over the past two days, pt has progressed very well with his mobility and functional status. Feel he no longer needs an IP Rehab stay. Called the patient to discuss his progress. He agrees he has progressed and no longer needs CIR. Currently, it sounds like his plan is to DC home with support from his fiance after completion of his oncology treatment.   AC will sign off.   Please call if questions.   Raechel Ache, OTR/L  Rehab Admissions Coordinator  [phone] 04/25/2019 12:34 PM

## 2019-04-25 NOTE — Progress Notes (Signed)
Patient's urine output noted to be much improved.  Pre-hydration, cisplatin both infused.  Etoposide currently infusing.  1000cc of amber urine emptied from Foley.  Will continue to monitor output for the duration of chemotherapy infusion.

## 2019-04-25 NOTE — Progress Notes (Signed)
Occupational Therapy Treatment Patient Details Name: Timothy [name] MRN: [id number] DOB: February 15, 1981 Today's Date: 04/25/2019    History of present illness Pt is a 39 y.o. male admitted 04/10/19 with LLE swelling and back pain; pt lives in [place] but drove down to  to visit mother ~1 wk ago. Found to have extensive LLE DVT; also with significant lymphadenopathy in the retroperitoneal area concerning for metastatic disease of unknown primary. Biopsy consistent with metastatic seminoma. S/p mechanical and pharmacological thrombolysis with stenting of the IVC for resistant malignant narrowing. Pt with ilieus 2/1. 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   OT comments  Pt requested to walk in hall; he will have assistance with LB adls once he d/cs.  Recommend 3:1 for over toilet and shower. Educated on energy conservation.  Follow Up Recommendations  Supervision/Assistance - 24 hour    Equipment Recommendations  3 in 1 bedside commode    Recommendations for Other Services      Precautions / Restrictions Precautions Precautions: Fall Precaution Comments: weeping blisters on posterior knee Restrictions Weight Bearing Restrictions: No       Mobility Bed Mobility                  Transfers   Equipment used: Rolling walker (2 wheeled)   Sit to Stand: Min guard         General transfer comment: for safety; from recliner (pt is 6'4")    Balance               Standing balance comment: reliant on RW in standing                           ADL either performed or assessed with clinical judgement   ADL                                         General ADL Comments: pt requested to walk in hall; supervision to min guard level.  Not unsteady; encouraged resting at the end of hallway.  He needs assistance for LB adls, but has been doing most himself. His plan is back to [place] with his fiance, who is at  home with him.  Educated on 3:1 over toilet as well as in shower stall.  Educated on Paediatric nurse      Cognition Arousal/Alertness: Awake/alert Behavior During Therapy: WFL for tasks assessed/performed Overall Cognitive Status: Within Functional Limits for tasks assessed                                          Exercises     Shoulder Instructions       General Comments      Pertinent Vitals/ Pain       Pain Assessment: Faces Faces Pain Scale: Hurts a little bit Pain Location: B LE stiff/edema Pain Descriptors / Indicators: Discomfort Pain Intervention(s): Limited activity within patient's tolerance;Monitored during session  Home Living  Prior Functioning/Environment              Frequency  Min 2X/week        Progress Toward Goals  OT Goals(current goals can now be found in the care plan section)  Progress towards OT goals: Progressing toward goals     Plan      Co-evaluation                 AM-PAC OT "6 Clicks" Daily Activity     Outcome Measure   Help from another person eating meals?: None Help from another person taking care of personal grooming?: A Little Help from another person toileting, which includes using toliet, bedpan, or urinal?: A Little Help from another person bathing (including washing, rinsing, drying)?: A Little Help from another person to put on and taking off regular upper body clothing?: A Little Help from another person to put on and taking off regular lower body clothing?: A Lot 6 Click Score: 18    End of Session    OT Visit Diagnosis: Other abnormalities of gait and mobility (R26.89);Pain Pain - Right/Left: Left Pain - part of body: Leg   Activity Tolerance Patient tolerated treatment well   Patient Left in chair;with call bell/phone within reach   Nurse Communication           Time: [id number] OT Time Calculation (min): 28 min  Charges: OT General Charges $OT Visit: 1 Visit OT Treatments $Therapeutic Activity: 23-37 mins  Valisha Heslin S, OTR/L Acute Rehabilitation Services 04/25/2019   Kannon Baum 04/25/2019, 11:58 AM

## 2019-04-25 NOTE — Progress Notes (Signed)
Patient successfully completed day 2 of chemotherapy.  Both Cisplatin and Etoposide completed.  Emptied 450cc of amber urine.  Started patient's post hydration to run over 2hours.  Instructed patient to call if needed further assistance.  Patient verbalized understanding with no current needs.

## 2019-04-25 NOTE — Progress Notes (Signed)
Physical Therapy Treatment Patient Details Name: Timothy [name] MRN: [id number] DOB: Aug 30, 1980 Today's Date: 04/25/2019    History of Present Illness Pt is a 39 y.o. male admitted 04/10/19 with LLE swelling and back pain; pt lives in [place] but drove down to [place] to visit mother ~1 wk ago. Found to have extensive LLE DVT; also with significant lymphadenopathy in the retroperitoneal area concerning for metastatic disease of unknown primary. Biopsy consistent with metastatic seminoma. S/p mechanical and pharmacological thrombolysis with stenting of the IVC for resistant malignant narrowing. Pt with ilieus 2/1. 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    PT Comments    Assisted to bathroom then amb in hallway with walker.  General Gait Details: tolerated an increased distance with mild pain in low back and B LE "stiffness"  "edema" which pt stated is "more so today".  Follow Up Recommendations  No PT follow up(plans to return home in [place] with Fiance)     Equipment Recommendations  Rolling walker with 5" wheels    Recommendations for Other Services       Precautions / Restrictions Precautions Precautions: Fall Precaution Comments: CHEMO Restrictions Weight Bearing Restrictions: No    Mobility  Bed Mobility               General bed mobility comments: OOB in recliner  Transfers Overall transfer level: Needs assistance Equipment used: Rolling walker (2 wheeled) Transfers: Sit to/from Stand Sit to Stand: Min guard;Supervision Stand pivot transfers: Supervision;Min guard       General transfer comment: for safety  Ambulation/Gait Ambulation/Gait assistance: Supervision Gait Distance (Feet): 250 Feet Assistive device: Rolling walker (2 wheeled) Gait Pattern/deviations: Decreased stride length;Trunk flexed;Step-through pattern;Wide base of support;Decreased step length - left Gait velocity: decreased   General Gait Details:  tolerated an increased distance with mild pain in low back and B LE "stiffness"  "edema" which pt stated is "more so today".   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      Exercises      General Comments        Pertinent Vitals/Pain Pain Assessment: Faces Faces Pain Scale: Hurts a little bit Pain Location: B LE stiff/edema Pain Descriptors / Indicators: Discomfort;Tightness Pain Intervention(s): Monitored during session    Home Living                      Prior Function            PT Goals (current goals can now be found in the care plan section) Progress towards PT goals: Progressing toward goals    Frequency    Min 3X/week      PT  Plan Current plan remains appropriate    Co-evaluation              AM-PAC PT "6 Clicks" Mobility   Outcome Measure  Help needed turning from your back to your side while in a flat bed without using bedrails?: A Little Help needed moving from lying on your back to sitting on the side of a flat bed without using bedrails?: A Little Help needed moving to and from a bed to a chair (including a wheelchair)?: A Little Help needed standing up from a chair using your arms (e.g., wheelchair or bedside chair)?: A Little Help needed to walk in hospital room?: A Little Help needed climbing 3-5 steps with a railing? : A Little 6 Click Score: 18    End of Session Equipment Utilized During Treatment: Gait belt Activity Tolerance: Patient tolerated treatment well Patient left: in chair;with call  bell/phone within reach Nurse Communication: Mobility status;Patient requests pain meds PT Visit Diagnosis: Other abnormalities of gait and mobility (R26.89);Pain;Difficulty in walking, not elsewhere classified (R26.2)     Time: [id number] PT Time Calculation (min) (ACUTE ONLY): 25 min  Charges:  $Gait Training: 8-22 mins $Therapeutic Activity: 8-22 mins                     Rica Koyanagi  PTA Acute  Rehabilitation Services Pager      [phone] Office      [phone]

## 2019-04-25 NOTE — Progress Notes (Signed)
PROGRESS NOTE  Timothy [name] [id number] DOB: 11-Aug-1980 DOA: 04/10/2019 PCP: Patient, No Pcp Per  Brief Summary: History of testicular carcinoma stage Ib followed at [facility] had right orchiectomy lives in [place] travel to [place] to visit his mother admitted to [place] long due to acute LLE DVT and found to have recurrent cancer. Pt was transferred to [place] for mechanical thrombolysis of LLE DVT, hospital course complicated by retroperitoneal hematoma, psoas hematoma, anemia, ileus,  AKI, new mild right hydronephrosis presumably from external compression,  fever of unknown origin on empirical abx due to underline immunosuppressed status. Pt was transferred back to [place] per oncology request to start chemo.     Today, patient denies any new complaints, asking for alternative to [facility] Lovenox as he may not be able to tolerate it due to pain.  Still distended, but does report improved on ambulation, denies any abdominal pain, nausea/vomiting.  Passing loose stool.  Denies any chest pain, cough, shortness of breath.  Noted to have a low-grade temp today.    Assessment/Plan: Principal Problem:   DVT (deep venous thrombosis) (HCC) Active Problems:   H/O testicular cancer   Lymphadenopathy, retroperitoneal   Seminoma (HCC)   Acute deep vein thrombosis (DVT) of left lower extremity (HCC)   Elevated serum creatinine   Abdominal distension   Abscess after procedure   Fever   Ileus (HCC)   Left lower extremity DVT s/p mechanical and pharmacological thrombolysis with stenting of the IVC for resistant malignant narrowing (IR Dr Earleen [place] on 1/31) Noted bulky retroperitoneal lymphadenopathy and IVC occlusionin the setting of testicular cancer S/P heparin gtt, transition to [place] therapeutic lovenox Long term anticoagulation plan: Lovenox to be continued for 30 days once discharged then transition to oral anticoagulation 90 days of aspirin 81 mg daily Elevate leg,  compression stocking Per Dr. Stephenie Acres. Lavon Paganini, Assistant Professor of Radiology, has kindly agreed to see Timothy [name] in continuity of his current vascular care, when Timothy [name] returns home Scheduling: [place] Office: [phone] Dr. Ottis Stain has also kindly arranged follow-up with the Medical Oncology Department of [facility], and Timothy [name] should have a pending office appointment upon arrival  Fever of unknown origin/leukocytosis Last febrile spike, low grade on 04/24/2019, 100.39F, still with resolving leukocytosis ??Malignancy Vs hematoma, no obvious source of infection BC/UC are negative Echo normal ejection fraction, no evidence of valvular infection Repeat chest x-ray showed faint bilateral lower lung field atelectasis or developing infiltrate (likely atelectasis due to significant abdominal distention, also has been on cefepime for about a week now) Oncology Dr Burr Medico recommended to keep empiric abx for now Continue cefepime/Flagyl  Sinus tachycardia Likely reactive CTA chest, no PE noted, although motion artifact TSH 6.484, free T4 1.09 Decision was made to DC telemetry for now as patient needs to start chemotherapy on the sixth floor, of which has no telemetry beds Monitor closely  Hypokalemia Replace as needed  AKI/ new onset urinary retention /mild right hydro Cr peaked at 1.46, trended back to normal Urology input appreciated  Daily CMP  Normocytic anemia Hemoglobin trending down Multifactorial, from acute illness, psoas hematoma, hematuria, malignancy S/p 1 U of PRBC Daily CBC  Ileus Noted abdominal distention, likely 2/2 significant narcotic use (adjusted narcotic dosage) Having loose BM Repeat x-ray abdomen Ambulate as tolerated  Recurrent testicular cancer  Noted retroperitoneal lymphoadenopathy Oncology Dr Burr Medico on board, started chemo on 04/24/19      DVT Prophylaxis: [place] therapeutic Lovenox  Code Status:  Full  Family  Communication: Patient requested not to speak to his family  Disposition Plan: Plan to discharge home once completed chemotherapy and consultant signing off   Consultants:  IR  Oncology  Urology  Critical care  Procedures:  Mechanical thrombolysis and IVC placement  Antibiotics:  Cefepime  Flagyl   Objective: BP 122/84 (BP Location: Right Arm)   Pulse 99   Temp 98 F (36.7 C) (Oral)   Resp (!) 21   Ht 6\' 4"  (1.93 m)   Wt 98.4 kg   SpO2 99%   BMI 26.41 kg/m   Intake/Output Summary (Last 24 hours) at 04/25/2019 1457 Last data filed at 04/25/2019 0600 Gross per 24 hour  Intake 1530.66 ml  Output 3600 ml  Net -2069.34 ml   Filed Weights   04/10/19 2005 04/18/19 0600  Weight: 90.7 kg 98.4 kg    Exam:  General: NAD   Cardiovascular: S1, S2 present  Respiratory:  Diminished breath sounds at the bases  Abdomen: Soft, nontender, nondistended, bowel sounds present  Musculoskeletal: 2+ bilateral pedal edema noted  Skin: Normal  Psychiatry: Normal mood   Data Reviewed: Basic Metabolic Panel: Recent Labs  Lab 04/20/19 0231 04/20/19 1038 04/21/19 0409 04/22/19 0500 04/23/19 0930 04/24/19 0414 04/25/19 0552  NA   < >  --  137 138 135 135 138  K   < >  --  3.5 3.0* 3.3* 2.8* 3.5  CL   < >  --  103 107 106 105 108  CO2   < >  --  20* 21* 19* 23 21*  GLUCOSE   < >  --  107* 108* 113* 127* 153*  BUN   < >  --  11 13 13 16 19   CREATININE   < >  --  1.33* 1.46* 1.32* 1.39* 0.83  CALCIUM   < >  --  7.7* 7.5* 7.5* 7.6* 7.4*  MG  --  2.5*  --   --   --  2.5* 2.7*   < > = values in this interval not displayed.   Liver Function Tests: Recent Labs  Lab 04/20/19 0231 04/21/19 0409 04/22/19 0500 04/23/19 0930 04/25/19 0552  AST 33 35 37 32 24  ALT 26 28 28 27 24   ALKPHOS 78 69 60 67 83  BILITOT 1.2 1.1 0.7 0.8 0.9  PROT 6.1* 5.9* 5.9* 6.1* 6.3*  ALBUMIN 2.3* 2.3* 2.2* 2.3* 2.7*   No results for input(s): LIPASE, AMYLASE in the last 168 hours. No  results for input(s): AMMONIA in the last 168 hours. CBC: Recent Labs  Lab 04/20/19 0231 04/21/19 0409 04/22/19 0500 04/23/19 0340 04/23/19 1406 04/24/19 0414 04/25/19 0552  WBC 8.4   < > 15.8* 15.5* 16.5* 13.4* 11.2*  NEUTROABS 6.3  --   --   --   --   --  10.1*  HGB 7.3*   < > 7.0* 6.7* 7.0* 7.2* 7.6*  HCT 22.6*   < > 21.2* 20.3* 21.4* 22.6* 24.4*  MCV 91.5   < > 89.8 89.0 90.3 91.5 92.8  PLT 398   < > 488* 454* 446* 340 354   < > = values in this interval not displayed.   Cardiac Enzymes:   Recent Labs  Lab 04/20/19 0231  CKTOTAL 624*   BNP (last 3 results) No results for input(s): BNP in the last 8760 hours.  ProBNP (last 3 results) No results for input(s): PROBNP in the last 8760 hours.  CBG: No  results for input(s): GLUCAP in the last 168 hours.  Recent Results (from the past 240 hour(s))  Culture, blood (routine x 2)     Status: None   Collection Time: 04/17/19 10:15 AM   Specimen: BLOOD RIGHT HAND  Result Value Ref Range Status   Specimen Description BLOOD RIGHT HAND  Final   Special Requests   Final    BOTTLES DRAWN AEROBIC ONLY Blood Culture results may not be optimal due to an inadequate volume of blood received in culture bottles   Culture   Final    NO GROWTH 5 DAYS Performed at [facility], [place] [address], [place], [place] [address]    Report Status 04/22/2019 FINAL  Final  Culture, blood (routine x 2)     Status: None   Collection Time: 04/17/19 10:20 AM   Specimen: BLOOD  Result Value Ref Range Status   Specimen Description BLOOD LEFT ANTECUBITAL  Final   Special Requests   Final    BOTTLES DRAWN AEROBIC AND ANAEROBIC Blood Culture adequate volume   Culture   Final    NO GROWTH 5 DAYS Performed at [facility], [place] [address] [place], [place] [address]    Report Status 04/22/2019 FINAL  Final  Culture, Urine     Status: None   Collection Time: 04/17/19 10:43 AM   Specimen: Urine, Random  Result Value Ref Range Status    Specimen Description URINE, RANDOM  Final   Special Requests NONE  Final   Culture   Final    NO GROWTH Performed at [facility], [place] [address], [place], [place] [address]    Report Status 04/18/2019 FINAL  Final  Culture, blood (routine x 2)     Status: None (Preliminary result)   Collection Time: 04/22/19 11:58 AM   Specimen: BLOOD LEFT WRIST  Result Value Ref Range Status   Specimen Description BLOOD LEFT WRIST  Final   Special Requests AEROBIC BOTTLE ONLY Blood Culture adequate volume  Final   Culture   Final    NO GROWTH 3 DAYS Performed at [place] [address], [place], [place] [address]    Report Status PENDING  Incomplete  Culture, blood (routine x 2)     Status: None (Preliminary result)   Collection Time: 04/22/19 11:59 AM   Specimen: BLOOD LEFT HAND  Result Value Ref Range Status   Specimen Description BLOOD LEFT HAND  Final   Special Requests AEROBIC BOTTLE ONLY Blood Culture adequate volume  Final   Culture   Final    NO GROWTH 3 DAYS Performed at [facility], [place] [address], [place], [place] [address]    Report Status PENDING  Incomplete     Studies: DG Chest Port 1 View  Result Date: 04/24/2019 CLINICAL DATA:  39 year old male status post PICC placement. EXAM: PORTABLE CHEST 1 VIEW COMPARISON:  Chest radiograph dated 04/18/2019. FINDINGS: Left-sided PICC with tip over the right atrium close to the cavoatrial junction. Faint bilateral lower lung field densities may represent atelectasis or atypical infection. Clinical correlation recommended. There is no pleural effusion or pneumothorax. The cardiac silhouette is within normal limits. No acute osseous pathology. IMPRESSION: 1. Left-sided PICC with tip over the right atrium. 2. Faint bilateral lower lung field atelectasis or developing infiltrate. Electronically Signed   By: Anner Crete M.D.   On: 04/24/2019 18:07    Scheduled Meds: . Chlorhexidine Gluconate Cloth  6 each Topical  Daily  .  enoxaparin (LOVENOX) injection  1 mg/kg Subcutaneous Q12H  . etoposide  100 mg/m2 (Treatment Plan Recorded) Intravenous Once  . feeding supplement  1 Container Oral TID BM  . feeding supplement (PRO-STAT SUGAR FREE 64)  30 mL Oral BID  . multivitamin with minerals  1 tablet Oral Daily  . pantoprazole (PROTONIX) IV  40 mg Intravenous Q12H  . polyethylene glycol  17 g Oral Daily  . sodium chloride flush  10-40 mL Intracatheter Q12H    Continuous Infusions: . sodium chloride    . sodium chloride    . ceFEPime (MAXIPIME) IV 2 g (04/25/19 1250)  . metronidazole 500 mg (04/25/19 1129)      Alma Friendly MD  [facility]     04/25/2019, 2:57 PM  LOS: 14 days

## 2019-04-26 LAB — COMPREHENSIVE METABOLIC PANEL
ALT: 22 U/L (ref 0–44)
AST: 22 U/L (ref 15–41)
Albumin: 2.5 g/dL — ABNORMAL LOW (ref 3.5–5.0)
Alkaline Phosphatase: 70 U/L (ref 38–126)
Anion gap: 7 (ref 5–15)
BUN: 24 mg/dL — ABNORMAL HIGH (ref 6–20)
CO2: 23 mmol/L (ref 22–32)
Calcium: 7.3 mg/dL — ABNORMAL LOW (ref 8.9–10.3)
Chloride: 108 mmol/L (ref 98–111)
Creatinine, Ser: 0.81 mg/dL (ref 0.61–1.24)
GFR calc Af Amer: >60 mL/min (ref >60)
GFR calc non Af Amer: >60 mL/min (ref >60)
Glucose, Bld: 135 mg/dL — ABNORMAL HIGH (ref 70–99)
Potassium: 3.4 mmol/L — ABNORMAL LOW (ref 3.5–5.1)
Sodium: 138 mmol/L (ref 135–145)
Total Bilirubin: 1 mg/dL (ref 0.3–1.2)
Total Protein: 6.2 g/dL — ABNORMAL LOW (ref 6.5–8.1)

## 2019-04-26 LAB — CBC WITH DIFFERENTIAL/PLATELET
Abs Immature Granulocytes: 0.22 10*3/uL — ABNORMAL HIGH (ref 0.00–0.07)
Basophils Absolute: 0 10*3/uL (ref 0.0–0.1)
Basophils Relative: 0 %
Eosinophils Absolute: 0 10*3/uL (ref 0.0–0.5)
Eosinophils Relative: 0 %
HCT: 23.1 % — ABNORMAL LOW (ref 39.0–52.0)
Hemoglobin: 7.3 g/dL — ABNORMAL LOW (ref 13.0–17.0)
Immature Granulocytes: 2 %
Lymphocytes Relative: 3 %
Lymphs Abs: 0.4 10*3/uL — ABNORMAL LOW (ref 0.7–4.0)
MCH: 29.2 pg (ref 26.0–34.0)
MCHC: 31.6 g/dL (ref 30.0–36.0)
MCV: 92.4 fL (ref 80.0–100.0)
Monocytes Absolute: 0.5 10*3/uL (ref 0.1–1.0)
Monocytes Relative: 4 %
Neutro Abs: 13.3 10*3/uL — ABNORMAL HIGH (ref 1.7–7.7)
Neutrophils Relative %: 91 %
Platelets: 409 10*3/uL — ABNORMAL HIGH (ref 150–400)
RBC: 2.5 MIL/uL — ABNORMAL LOW (ref 4.22–5.81)
RDW: 14.8 % (ref 11.5–15.5)
WBC: 14.5 10*3/uL — ABNORMAL HIGH (ref 4.0–10.5)
nRBC: 0.3 % — ABNORMAL HIGH (ref 0.0–0.2)

## 2019-04-26 LAB — MAGNESIUM: Magnesium: 2.6 mg/dL — ABNORMAL HIGH (ref 1.7–2.4)

## 2019-04-26 MED ORDER — PALONOSETRON HCL INJECTION 0.25 MG/5ML
0.2500 mg | Freq: Once | INTRAVENOUS | Status: AC
  Administered 2019-04-26: 11:00:00 0.25 mg via INTRAVENOUS
  Filled 2019-04-26: qty 5

## 2019-04-26 MED ORDER — KCL IN DEXTROSE-NACL 40-5-0.45 MEQ/L-%-% IV SOLN
Freq: Once | INTRAVENOUS | Status: AC
  Filled 2019-04-26: qty 1000

## 2019-04-26 MED ORDER — SODIUM CHLORIDE 0.9 % IV SOLN
20.0000 mg/m2 | Freq: Once | INTRAVENOUS | Status: AC
  Administered 2019-04-26: 46 mg via INTRAVENOUS
  Filled 2019-04-26: qty 46

## 2019-04-26 MED ORDER — FERROUS SULFATE 325 (65 FE) MG PO TABS
325.0000 mg | ORAL_TABLET | Freq: Every day | ORAL | Status: DC
  Administered 2019-04-27 – 2019-04-29 (×3): 325 mg via ORAL
  Filled 2019-04-26 (×3): qty 1

## 2019-04-26 MED ORDER — SODIUM CHLORIDE 0.9 % IV SOLN
10.0000 mg | Freq: Once | INTRAVENOUS | Status: AC
  Administered 2019-04-26: 10 mg via INTRAVENOUS
  Filled 2019-04-26: qty 1

## 2019-04-26 MED ORDER — SODIUM CHLORIDE 0.9 % IV SOLN
150.0000 mg | Freq: Once | INTRAVENOUS | Status: AC
  Administered 2019-04-26: 12:00:00 150 mg via INTRAVENOUS
  Filled 2019-04-26: qty 5

## 2019-04-26 MED ORDER — POLYETHYLENE GLYCOL 3350 17 G PO PACK
17.0000 g | PACK | Freq: Two times a day (BID) | ORAL | Status: DC
  Administered 2019-04-28: 17 g via ORAL
  Filled 2019-04-26 (×3): qty 1

## 2019-04-26 MED ORDER — POTASSIUM CHLORIDE 2 MEQ/ML IV SOLN: Freq: Once | INTRAVENOUS | Status: DC

## 2019-04-26 MED ORDER — SODIUM CHLORIDE 0.9 % IV SOLN
100.0000 mg/m2 | Freq: Once | INTRAVENOUS | Status: AC
  Administered 2019-04-26: 14:00:00 230 mg via INTRAVENOUS
  Filled 2019-04-26: qty 11.5

## 2019-04-26 NOTE — Progress Notes (Signed)
PROGRESS NOTE    Timothy [name]  UXL:[id number] DOB: 30-Oct-1980 DOA: 04/10/2019 PCP: Patient, No Pcp Per   Brief Narrative:  History of testicular carcinoma stage Ib followed at [facility] had right orchiectomy lives in [place] travel to [place] to visit his mother admitted to [place] long due to acute LLE DVT and found to have recurrent cancer. Pt was transferred to [place] for mechanical thrombolysis of LLE DVT, hospital course complicated by retroperitoneal hematoma, psoas hematoma, anemia, ileus,  AKI, new mild right hydronephrosis presumably from external compression,  fever of unknown origin on empirical abx due to underline immunosuppressed status. Pt was transferred back to [place] per oncology request to start chemo.  Assessment & Plan:   Principal Problem:   DVT (deep venous thrombosis) (HCC) Active Problems:   H/O testicular cancer   Lymphadenopathy, retroperitoneal   Seminoma (HCC)   Acute deep vein thrombosis (DVT) of left lower extremity (HCC)   Elevated serum creatinine   Abdominal distension   Abscess after procedure   Fever   Ileus (HCC)  Left lower extremity DVT s/p mechanical and pharmacological thrombolysis with stenting of the IVC for resistant malignant narrowing (IR Dr Earleen [place] on 1/31) Noted bulky retroperitoneal lymphadenopathy and IVC occlusionin the setting of testicular cancer S/P heparin gtt, transition to [place] therapeutic lovenox Long term anticoagulation plan:  - Lovenox to be continued for 30 days, then transition to oral anticoagulation of managing physician's choice - 90 days of 81 mg ASA daily  - Bilateral LE compression stockings, at least knee high  - will need copy of medical records to facilitate care on discharge Per Dr. Stephenie Acres. Lavon Paganini, Assistant Professor of Radiology, has kindly agreed to see Mr Mullen in continuity of his current vascular care, when Mr Germer returns home Scheduling: [place] Office:  [phone] Dr. Ottis Stain has also kindly arranged follow-up with the Medical Oncology Department of [facility], and Mr Fauble should have Krystalyn Kubota pending office appointment upon arrival  Fever of unknown origin/leukocytosis Last febrile spike, low grade on 04/24/2019, 100.69F, leukocytosis is persistent BC/UC are negative Likely 2/2 recurrent malignancy Echo normal ejection fraction, no evidence of valvular infection Repeat chest x-ray showed faint bilateral lower lung field atelectasis or developing infiltrate (likely atelectasis due to significant abdominal distention, also has been on cefepime for about Dedra Matsuo week now) Will d/c abx as suspect fever is related to malignancy  Sinus tachycardia Likely reactive - now improved CTA chest, no PE noted, although motion artifact TSH 6.484, free T4 1.09 Decision was made to DC telemetry for now as patient needs to start chemotherapy on the sixth floor, of which has no telemetry beds Monitor closely  Hypokalemia Replace and follow  AKI/ new onset urinary retention /mild right hydro Cr peaked at 1.46, trended back to normal Urology input appreciated - note by Dr. Louis Meckel on 2/7, no recommendation for any urologic intervention at this time Daily CMP  Normocytic anemia  Iron Deficiency Hemoglobin trending down Multifactorial, from acute illness, psoas hematoma, hematuria, malignancy S/p 1 U of PRBC Daily CBC Start iron supplementation Follow B12, folate  Ileus Noted abdominal distention, likely 2/2 significant narcotic use (adjusted narcotic dosage) Having loose BM Repeat x-ray abdomen - c/w ileus Ambulate as tolerated  Recurrent testicular cancer  Noted retroperitoneal lymphoadenopathy Oncology Dr Burr Medico on board, started chemo on 04/24/19  DVT prophylaxis: lovenox Code Status: full code Family Communication: none at bedside Disposition Plan:  . Patient came from: home           .  Anticipated d/c place: home . Barriers  to d/c OR conditions which need to be met to effect Anelisse Jacobson safe d/c: pending clearance by Oncology   Consultants:   Oncology  IR  Urology  Critical Care  Procedures:   Mechanical thrombolysis and stenting of IVC  CT biopsy 1/27  Antimicrobials: Anti-infectives (From admission, onward)   Start     Dose/Rate Route Frequency Ordered Stop   04/19/19 1600  metroNIDAZOLE (FLAGYL) IVPB 500 mg  Status:  Discontinued     500 mg 100 mL/hr over 60 Minutes Intravenous Every 8 hours 04/19/19 1554 04/26/19 1836   04/18/19 1900  vancomycin (VANCOREADY) IVPB 1250 mg/250 mL  Status:  Discontinued     1,250 mg 166.7 mL/hr over 90 Minutes Intravenous Every 8 hours 04/18/19 1035 04/20/19 1305   04/18/19 1000  ceFEPIme (MAXIPIME) 1 g in sodium chloride 0.9 % 100 mL IVPB  Status:  Discontinued     1 g 200 mL/hr over 30 Minutes Intravenous Every 12 hours 04/18/19 0748 04/18/19 0926   04/18/19 1000  ceFEPIme (MAXIPIME) 2 g in sodium chloride 0.9 % 100 mL IVPB  Status:  Discontinued     2 g 200 mL/hr over 30 Minutes Intravenous Every 8 hours 04/18/19 0926 04/26/19 1311   04/18/19 0930  vancomycin (VANCOREADY) IVPB 2000 mg/400 mL     2,000 mg 200 mL/hr over 120 Minutes Intravenous  Once 04/18/19 0842 04/18/19 1258     Subjective: C/o fatigue  Objective: Vitals:   04/25/19 1354 04/25/19 2006 04/26/19 0634 04/26/19 1402  BP: 122/84 128/83 125/82 125/76  Pulse: 99 (!) 101 89 85  Resp: (!) '21 19 16 20  ' Temp: 98 F (36.7 C) 98.1 F (36.7 C) 97.9 F (36.6 C)   TempSrc: Oral Oral Oral   SpO2: 99% 98% 96% 96%  Weight:      Height:        Intake/Output Summary (Last 24 hours) at 04/26/2019 1657 Last data filed at 04/26/2019 1325 Gross per 24 hour  Intake 492.29 ml  Output 3126 ml  Net -2633.71 ml   Filed Weights   04/10/19 2005 04/18/19 0600  Weight: 90.7 kg 98.4 kg    Examination:  General exam: Appears calm and comfortable  Respiratory system: Clear to auscultation. Respiratory  effort normal. Cardiovascular system: S1 & S2 heard, RRR.  Gastrointestinal system: Abdomen is nondistended, soft and nontender. Central nervous system: Alert and oriented. No focal neurological deficits. Extremities: bilateral LE edema Skin: No rashes, lesions or ulcers Psychiatry: Judgement and insight appear normal. Mood & affect appropriate.     Data Reviewed: I have personally reviewed following labs and imaging studies  CBC: Recent Labs  Lab 04/20/19 0231 04/21/19 0409 04/23/19 0340 04/23/19 1406 04/24/19 0414 04/25/19 0552 04/26/19 0537  WBC 8.4   < > 15.5* 16.5* 13.4* 11.2* 14.5*  NEUTROABS 6.3  --   --   --   --  10.1* 13.3*  HGB 7.3*   < > 6.7* 7.0* 7.2* 7.6* 7.3*  HCT 22.6*   < > 20.3* 21.4* 22.6* 24.4* 23.1*  MCV 91.5   < > 89.0 90.3 91.5 92.8 92.4  PLT 398   < > 454* 446* 340 354 409*   < > = values in this interval not displayed.   Basic Metabolic Panel: Recent Labs  Lab 04/20/19 1038 04/21/19 0409 04/22/19 0500 04/23/19 0930 04/24/19 0414 04/25/19 0552 04/26/19 0537  NA  --    < > 138 135 135 138  138  K  --    < > 3.0* 3.3* 2.8* 3.5 3.4*  CL  --    < > 107 106 105 108 108  CO2  --    < > 21* 19* 23 21* 23  GLUCOSE  --    < > 108* 113* 127* 153* 135*  BUN  --    < > '13 13 16 19 ' 24*  CREATININE  --    < > 1.46* 1.32* 1.39* 0.83 0.81  CALCIUM  --    < > 7.5* 7.5* 7.6* 7.4* 7.3*  MG 2.5*  --   --   --  2.5* 2.7* 2.6*   < > = values in this interval not displayed.   GFR: Estimated Creatinine Clearance: 154.8 mL/min (by C-G formula based on SCr of 0.81 mg/dL). Liver Function Tests: Recent Labs  Lab 04/21/19 0409 04/22/19 0500 04/23/19 0930 04/25/19 0552 04/26/19 0537  AST 35 37 32 24 22  ALT '28 28 27 24 22  ' ALKPHOS 69 60 67 83 70  BILITOT 1.1 0.7 0.8 0.9 1.0  PROT 5.9* 5.9* 6.1* 6.3* 6.2*  ALBUMIN 2.3* 2.2* 2.3* 2.7* 2.5*   No results for input(s): LIPASE, AMYLASE in the last 168 hours. No results for input(s): AMMONIA in the last 168  hours. Coagulation Profile: No results for input(s): INR, PROTIME in the last 168 hours. Cardiac Enzymes: Recent Labs  Lab 04/20/19 0231  CKTOTAL 624*   BNP (last 3 results) No results for input(s): PROBNP in the last 8760 hours. HbA1C: No results for input(s): HGBA1C in the last 72 hours. CBG: No results for input(s): GLUCAP in the last 168 hours. Lipid Profile: No results for input(s): CHOL, HDL, LDLCALC, TRIG, CHOLHDL, LDLDIRECT in the last 72 hours. Thyroid Function Tests: Recent Labs    04/24/19 0414  TSH 6.484*  FREET4 1.09   Anemia Panel: No results for input(s): VITAMINB12, FOLATE, FERRITIN, TIBC, IRON, RETICCTPCT in the last 72 hours. Sepsis Labs: Recent Labs  Lab 04/20/19 0231  PROCALCITON 0.42    Recent Results (from the past 240 hour(s))  Culture, blood (routine x 2)     Status: None   Collection Time: 04/17/19 10:15 AM   Specimen: BLOOD RIGHT HAND  Result Value Ref Range Status   Specimen Description BLOOD RIGHT HAND  Final   Special Requests   Final    BOTTLES DRAWN AEROBIC ONLY Blood Culture results may not be optimal due to an inadequate volume of blood received in culture bottles   Culture   Final    NO GROWTH 5 DAYS Performed at [facility], [place] [address], [place], [place] [address]    Report Status 04/22/2019 FINAL  Final  Culture, blood (routine x 2)     Status: None   Collection Time: 04/17/19 10:20 AM   Specimen: BLOOD  Result Value Ref Range Status   Specimen Description BLOOD LEFT ANTECUBITAL  Final   Special Requests   Final    BOTTLES DRAWN AEROBIC AND ANAEROBIC Blood Culture adequate volume   Culture   Final    NO GROWTH 5 DAYS Performed at [facility], [place] [address], [place], [place] [address]    Report Status 04/22/2019 FINAL  Final  Culture, Urine     Status: None   Collection Time: 04/17/19 10:43 AM   Specimen: Urine, Random  Result Value Ref Range Status   Specimen Description URINE, RANDOM  Final   Special  Requests NONE  Final   Culture   Final    NO GROWTH Performed at [facility], [place] [address] [place], [place] [address]    Report Status 04/18/2019 FINAL  Final  Culture, blood (routine x 2)     Status: None (Preliminary result)   Collection Time: 04/22/19 11:58 AM   Specimen: BLOOD LEFT WRIST  Result Value Ref Range Status   Specimen Description BLOOD LEFT WRIST  Final   Special Requests AEROBIC BOTTLE ONLY Blood Culture adequate volume  Final   Culture   Final    NO GROWTH 4 DAYS Performed at [facility], [place] [address], [place], [place] [address]    Report Status PENDING  Incomplete  Culture, blood (routine x 2)     Status: None (Preliminary result)   Collection Time: 04/22/19 11:59 AM   Specimen: BLOOD LEFT HAND  Result Value Ref Range Status   Specimen Description BLOOD LEFT HAND  Final   Special Requests AEROBIC BOTTLE ONLY Blood Culture adequate volume  Final   Culture   Final    NO GROWTH 4 DAYS Performed at [facility], [place] [address], [place], [place] [address]    Report Status PENDING  Incomplete         Radiology Studies: DG Chest Port 1 View  Result Date: 04/24/2019 CLINICAL DATA:  39 year old male status post PICC placement. EXAM: PORTABLE CHEST 1 VIEW COMPARISON:  Chest radiograph dated 04/18/2019. FINDINGS: Left-sided PICC with tip over the right atrium close to the cavoatrial junction. Faint bilateral lower lung field densities may represent atelectasis or atypical infection. Clinical correlation recommended. There is no pleural effusion or pneumothorax. The cardiac silhouette is within normal limits. No acute osseous pathology. IMPRESSION: 1. Left-sided PICC with tip over the right atrium. 2. Faint bilateral lower lung field atelectasis or developing infiltrate. Electronically Signed   By: Anner Crete M.D.   On: 04/24/2019 18:07   DG Abd Portable 1V  Result Date: 04/25/2019 CLINICAL DATA:  Abdominal distension, history of testicular  cancer, history of DVT EXAM: PORTABLE ABDOMEN - 1 VIEW COMPARISON:  04/21/2019, 04/22/2019 FINDINGS: 2 supine frontal views of the abdomen and pelvis demonstrate IVC stent unchanged. Diffuse gaseous distension of the large and small bowel is stable consistent with ileus. No masses or abnormal calcifications. IMPRESSION: 1. Stable gaseous distension of the bowel compatible with ileus. Electronically Signed   By: Randa Ngo M.D.   On: 04/25/2019 16:22        Scheduled Meds: . Chlorhexidine Gluconate Cloth  6 each Topical Daily  . enoxaparin (LOVENOX) injection  1 mg/kg Subcutaneous Q12H  . feeding supplement  1 Container Oral TID BM  . feeding supplement (PRO-STAT SUGAR FREE 64)  30 mL Oral BID  . multivitamin with minerals  1 tablet Oral Daily  . pantoprazole (PROTONIX) IV  40 mg Intravenous Q12H  . polyethylene glycol  17 g Oral Daily  . sodium chloride flush  10-40 mL Intracatheter Q12H   Continuous Infusions: . sodium chloride    . metronidazole 500 mg (04/26/19 1027)     LOS: 15 days    Time spent: over 30 min    Fayrene Helper, MD [facility]   To contact the attending provider between 7A-7P or the covering provider during after hours 7P-7A, please log into the web site [email or web address] and access using universal [place] password for that web site. If you do not have the password, please call the hospital operator.  04/26/2019, 4:57 PM

## 2019-04-26 NOTE — Progress Notes (Signed)
Per Dr. Burr Medico proceed with chemo today with same doses as yesterday.  Will add addl 20Meq potassium for a total of 40Meq in todays IVF.  Will hold Mag today in IVF.

## 2019-04-26 NOTE — Progress Notes (Signed)
HEMATOLOGY-ONCOLOGY PROGRESS NOTE  SUBJECTIVE: Pt tolerated the second day of chemotherapy well yesterday, he is urinating well, no hematuria.  He is able to ambulate with a walker in the hallway 3 times a day, he is tolerating liquid diet, feels his abdominal distention is overall improved, especially after walking.  No nausea, vomiting, or other new plan.  He has been afebrile since he started chemo, has loose bowel movement twice a day.  Leg swollen is stable, no tenderness or pain.  I have reviewed the past medical history, past surgical history, social history and family history with the patient and they are unchanged from previous note.   PHYSICAL EXAMINATION:  Vitals:   04/25/19 2006 04/26/19 0634  BP: 128/83 125/82  Pulse: (!) 101 89  Resp: 19 16  Temp: 98.1 F (36.7 C) 97.9 F (36.6 C)  SpO2: 98% 96%   Filed Weights   04/10/19 2005 04/18/19 0600  Weight: 200 lb (90.7 kg) 216 lb 14.9 oz (98.4 kg)    Intake/Output from previous day: 02/09 0701 - 02/10 0700 In: 1372.3 [P.O.:720; IV Piggyback:652.3] Out: 3001 [Urine:3000; Stool:1]  GENERAL:alert, no distress and comfortable LEGS: Mild bilateral lower extremity edema, more on left,  no tenderness, or skin erythema, no edema on right leg  Abdomen: Distended, soft, nontender, active bowel sounds Lung: Normal clear breath sounds on bilateral lung, no crackling   LABORATORY DATA:  I have reviewed the data as listed CMP Latest Ref Rng & Units 04/26/2019 04/25/2019 04/24/2019  Glucose 70 - 99 mg/dL 135(H) 153(H) 127(H)  BUN 6 - 20 mg/dL 24(H) 19 16  Creatinine 0.61 - 1.24 mg/dL 0.81 0.83 1.39(H)  Sodium 135 - 145 mmol/L 138 138 135  Potassium 3.5 - 5.1 mmol/L 3.4(L) 3.5 2.8(L)  Chloride 98 - 111 mmol/L 108 108 105  CO2 22 - 32 mmol/L 23 21(L) 23  Calcium 8.9 - 10.3 mg/dL 7.3(L) 7.4(L) 7.6(L)  Total Protein 6.5 - 8.1 g/dL 6.2(L) 6.3(L) -  Total Bilirubin 0.3 - 1.2 mg/dL 1.0 0.9 -  Alkaline Phos 38 - 126 U/L 70 83 -  AST 15 -  41 U/L 22 24 -  ALT 0 - 44 U/L 22 24 -    Lab Results  Component Value Date   WBC 14.5 (H) 04/26/2019   HGB 7.3 (L) 04/26/2019   HCT 23.1 (L) 04/26/2019   MCV 92.4 04/26/2019   PLT 409 (H) 04/26/2019   NEUTROABS 13.3 (H) 04/26/2019    CT ABDOMEN PELVIS WO CONTRAST  Result Date: 04/22/2019 CLINICAL DATA:  Fever, abdominal distension, testicular cancer, status post right orchiectomy EXAM: CT ABDOMEN AND PELVIS WITHOUT CONTRAST TECHNIQUE: Multidetector CT imaging of the abdomen and pelvis was performed following the standard protocol without IV contrast. COMPARISON:  Abdominal radiographs, 04/21/2019, CT abdomen pelvis, 04/17/2018, 04/11/2019 FINDINGS: Lower chest: Unchanged small bilateral pleural effusions and associated atelectasis or consolidation. Hepatobiliary: No solid liver abnormality is seen. No gallstones, gallbladder wall thickening, or biliary dilatation. Pancreas: Unremarkable. No pancreatic ductal dilatation or surrounding inflammatory changes. Spleen: Normal in size without significant abnormality. Adrenals/Urinary Tract: Adrenal glands are unremarkable. Mild right hydronephrosis, slightly increased compared to prior examination. Foley catheter in the urinary bladder. Stomach/Bowel: Stomach is within normal limits. The colon remains diffusely gas and fluid-filled to the rectum, caliber of the colon slightly increased, measuring up to 9 cm in the transverse colon. Sigmoid diverticulosis. Vascular/Lymphatic: Stent of the infrarenal IVC. Unchanged bulky retroperitoneal lymphadenopathy, largest left retroperitoneal nodes measuring up to 3.5 x 2.8  cm (series 3, image 60). Reproductive: No mass or other significant abnormality. Other: Anasarca. Retroperitoneal hematomas in the left psoas (series 3, image 57) and in the retroperitoneal fat posterior to the right psoas (series 3, image 66), not significantly changed. Musculoskeletal: No acute or significant osseous findings. IMPRESSION: 1. The  colon remains diffusely gas and fluid-filled to the rectum, slightly increased in caliber compared to prior examination. Findings favor ileus. No specific findings to suggest infectious or inflammatory colitis. 2. Retroperitoneal hematomas in the left psoas and retroperitoneal fat posterior to the right psoas are not significantly changed. 3. Unchanged bulky retroperitoneal lymphadenopathy. 4. Mild right hydronephrosis, slightly increased compared to prior examination, the mid to distal ureter presumably obstructed by adjacent lymph nodes. 5. Pleural effusions and anasarca. 6. Status post right orchiectomy. Electronically Signed   By: Eddie Candle M.D.   On: 04/22/2019 13:21   DG Abd 1 View  Result Date: 04/21/2019 CLINICAL DATA:  Ileus, history of IVC stent placement EXAM: ABDOMEN - 1 VIEW COMPARISON:  04/20/2019 FINDINGS: Supine frontal view of the abdomen and pelvis excludes the left flank and lower pelvis by collimation. There is progressive gaseous distension of large and small bowel. IVC stent unchanged. No masses or abnormal calcifications. IMPRESSION: Progressive gaseous distension of large and small bowel consistent with ileus. Electronically Signed   By: Randa Ngo M.D.   On: 04/21/2019 08:42   DG Abd 1 View  Result Date: 04/20/2019 CLINICAL DATA:  Diffuse abdominal tenderness, abdominal distension EXAM: ABDOMEN - 1 VIEW COMPARISON:  04/19/2019 FINDINGS: 2 supine frontal views of the abdomen and pelvis demonstrate persistent diffuse gaseous distention of the colon likely postoperative ileus. No masses or abnormal calcifications. Venous stent again noted. IMPRESSION: 1. Continued postoperative ileus. Electronically Signed   By: Randa Ngo M.D.   On: 04/20/2019 15:10   DG Abd 1 View  Result Date: 04/19/2019 CLINICAL DATA:  Evaluate ileus. EXAM: ABDOMEN - 1 VIEW COMPARISON:  April 19, 2019 (acquired at 8:04 a.m. FINDINGS: Numerous dilated air-filled loops of large bowel are again seen. This  is unchanged in appearance when compared to the prior study. The visualized small bowel loops are normal in caliber. A radiopaque stent is seen overlying the medial aspect of the mid right abdomen. No radio-opaque calculi or other significant radiographic abnormality are seen. IMPRESSION: 1. Stable findings likely consistent with a postoperative ileus, unchanged in appearance when compared to the prior study dated April 19, 2019 (acquired at 8:04 a.m.). Electronically Signed   By: Virgina Norfolk M.D.   On: 04/19/2019 20:28   DG Abd 1 View  Result Date: 04/19/2019 CLINICAL DATA:  Generalized abdominal pain, postoperative ileus. EXAM: ABDOMEN - 1 VIEW COMPARISON:  April 18, 2019. FINDINGS: Stable dilated air-filled colon is noted most consistent with postoperative ileus. No significant small bowel dilatation is noted. Phleboliths are noted in the pelvis. IVC stent is again noted. IMPRESSION: Stable dilated air-filled colon is noted most consistent with postoperative ileus. Electronically Signed   By: Marijo Conception M.D.   On: 04/19/2019 09:14   CT ANGIO CHEST PE W OR WO CONTRAST  Result Date: 04/19/2019 CLINICAL DATA:  Hypoxia. EXAM: CT ANGIOGRAPHY CHEST WITH CONTRAST TECHNIQUE: Multidetector CT imaging of the chest was performed using the standard protocol during bolus administration of intravenous contrast. Multiplanar CT image reconstructions and MIPs were obtained to evaluate the vascular anatomy. CONTRAST:  13m OMNIPAQUE IOHEXOL 300 MG/ML SOLN, 8109mOMNIPAQUE IOHEXOL 350 MG/ML SOLN COMPARISON:  04/11/2019 FINDINGS: Cardiovascular:  Evaluation for acute pulmonary emboli is severely limited by respiratory motion artifact and suboptimal contrast bolus timing. Given these limitations, no large centrally located pulmonary embolism was detected.Detection of smaller pulmonary emboli is not possible on this exam. The main pulmonary artery is within normal limits for size. There is no CT evidence of acute  right heart strain. The visualized aorta is normal. Heart size is normal, without pericardial effusion. Mediastinum/Nodes: --No mediastinal or hilar lymphadenopathy. --No axillary lymphadenopathy. --there is an enlarged left supraclavicular lymph node measuring 1.4 cm (axial series 5, image 5). --Normal thyroid gland. --The esophagus is unremarkable Lungs/Pleura: There is atelectasis at the lung bases bilaterally. There are new small bilateral pleural effusions, left greater than right. Upper Abdomen: No acute abnormality. Musculoskeletal: No chest wall abnormality. No acute or significant osseous findings. Review of the MIP images confirms the above findings. IMPRESSION: 1. Evaluation for acute pulmonary emboli is severely limited by respiratory motion artifact and suboptimal contrast bolus timing. Given these limitations, no large centrally located pulmonary embolism was detected. 2. New small bilateral pleural effusions, left greater than right. 3. Enlarged left supraclavicular lymph node measuring 1.4 cm. This may be malignant or reactive in etiology. It is amenable to percutaneous biopsy as clinically indicated. Electronically Signed   By: Constance Holster M.D.   On: 04/19/2019 23:17   CT Angio Chest PE W and/or Wo Contrast  Result Date: 04/11/2019 CLINICAL DATA:  Positive lower extremity DVT, PE suspected. Back pain and leg swelling, history of testicular cancer. EXAM: CT ANGIOGRAPHY CHEST CT ABDOMEN AND PELVIS WITH CONTRAST TECHNIQUE: Multidetector CT imaging of the chest was performed using the standard protocol during bolus administration of intravenous contrast. Multiplanar CT image reconstructions and MIPs were obtained to evaluate the vascular anatomy. Multidetector CT imaging of the abdomen and pelvis was performed using the standard protocol during bolus administration of intravenous contrast. CONTRAST:  119m OMNIPAQUE IOHEXOL 350 MG/ML SOLN COMPARISON:  None. FINDINGS: CTA CHEST FINDINGS  Cardiovascular: Suboptimal opacification of the pulmonary arteries limits evaluation beyond the lobar level. No large central or lobar pulmonary arterial filling defects are identified. Central pulmonary arteries are normal caliber. Normal cardiac size. 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 The aorta is normal caliber. Shared origin of the brachiocephalic and left common carotid arteries. Great vessels are otherwise unremarkable. Mediastinum/Nodes: No enlarged mediastinal, hilar or axillary adenopathy is seen. No acute abnormality of the trachea or esophagus. Thyroid gland is normal. Asymmetric thickening of the left sternocleidomastoid is likely related to muscle activation. Lungs/Pleura: Respiratory motion artifact is mild. No consolidation, features of edema, pneumothorax, or effusion. No suspicious pulmonary nodules or masses. Musculoskeletal: No acute osseous abnormality or suspicious osseous lesion in the imaged chest. No worrisome chest wall lesions. Mild bilateral gynecomastia is noted, right slightly greater left. Review of the MIP images confirms the above findings. CT ABDOMEN and PELVIS FINDINGS Hepatobiliary: No focal liver abnormality is seen. No gallstones, gallbladder wall thickening, or biliary dilatation. Pancreas: Unremarkable. No pancreatic ductal dilatation or surrounding inflammatory changes. Spleen: Normal in size without focal abnormality. Adrenals/Urinary Tract: Adrenal glands are unremarkable. Kidneys are normal, without renal calculi, focal lesion, or hydronephrosis. Bladder is unremarkable. Stomach/Bowel: Distal esophagus, stomach and duodenal sweep are unremarkable. No small bowel wall thickening or dilatation. No evidence of obstruction. A normal appendix is visualized. No colonic dilatation or wall thickening. Scattered colonic diverticula without focal pericolonic inflammation to suggest diverticulitis.  Vascular/Lymphatic: There is extensive retroperitoneal and pelvic adenopathy, some of the larger nodes include a left periaortic node measuring up to 3.2 cm (2/43) and a 2 cm aortocaval lymph node (2/35) there is distal abdominal aortic encasement by a extensive retroperitoneal adenopathy. Furthermore there is likely direct compression and/or direct nodal involvement of the inferior vena cava (2/40 with the lower IVC appearing expanded by hypoattenuating filling defects. Edematous changes are noted throughout the pelvis with venous engorgement and collateralization. Reproductive: The prostate and seminal vesicles are unremarkable. There is a varicocele seen in the left scrotum. External genitalia are incompletely included on this exam. Other: There is circumferential body wall edema from the level of the pelvis inferiorly more pronounced throughout the left lower extremity. Free fluid is seen in the deep pelvis, likely reactive or secondary to venous occlusion with developing collateralization. Musculoskeletal: No acute osseous abnormality or suspicious osseous lesion. Normal symmetric muscular enhancement. Review of the MIP images confirms the above findings. IMPRESSION: 1. Suboptimal opacification of the pulmonary arteries limits evaluation beyond the lobar level. No large central or lobar pulmonary arterial filling defects are identified. 2. Extensive retroperitoneal and pelvic adenopathy. There is likely direct compression of the lower IVC with the distal IVC and iliac veins appearing expanded by hypoattenuating thrombus. There is complete encasement of the abdominal aorta as well but without luminal narrowing. Findings are highly concerning for metastatic disease, particularly given patient history of testicular cancer. 3. Left scrotal varicocele. External genitalia are incompletely included on this exam. Consider further evaluation scrotal ultrasound given extensive retroperitoneal adenopathy and patient's  history of testicular cancer. 4. Free fluid in the deep pelvis is likely reactive or secondary to venous occlusion with developing collateralization. 5. Colonic diverticulosis without evidence of diverticulitis. 6. Mild bilateral gynecomastia, right slightly greater left. 7. 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 Critical Value/emergent results were called by telephone at the time of interpretation on 04/11/2019 at 1:33 am to [place] , who verbally acknowledged these results. Electronically Signed   By: Lovena Le M.D.   On: 04/11/2019 01:33   MR BRAIN W WO CONTRAST  Result Date: 04/15/2019 CLINICAL DATA:  History of testicular cancer. Assess for metastatic disease. EXAM: MRI HEAD WITHOUT AND WITH CONTRAST TECHNIQUE: Multiplanar, multiecho pulse sequences of the brain and surrounding structures were obtained without and with intravenous contrast. CONTRAST:  65m GADAVIST GADOBUTROL 1 MMOL/ML IV SOLN COMPARISON:  None. FINDINGS: Brain: The brain has a normal appearance without evidence of malformation, atrophy, old or acute small or large vessel infarction, mass lesion, hemorrhage, hydrocephalus or extra-axial collection. After contrast administration, no abnormal enhancement occurs. Vascular: Major vessels at the base of the brain show flow. Venous sinuses appear patent. Skull and upper cervical spine: Normal. Sinuses/Orbits: Clear/normal. Other: None significant. IMPRESSION: Normal examination.  No evidence of metastatic disease. Electronically Signed   By: MNelson ChimesM.D.   On: 04/15/2019 11:07   UKoreaScrotum  Result Date: 04/11/2019 CLINICAL DATA:  Concern for testicular cancer EXAM: ULTRASOUND OF SCROTUM TECHNIQUE: Complete ultrasound examination of the testicles, epididymis, and other scrotal structures was performed. COMPARISON:  None. FINDINGS: Right testicle Measurements: Surgically removed. Left testicle Measurements:  3.9 x 2.2 x 2.6 cm. No mass or microlithiasis visualized. Right epididymis:  Not visualized Left epididymis:  Normal in size and appearance. Hydrocele:  Moderate left hydrocele Varicocele:  Moderate left varicocele. IMPRESSION: No left testicular abnormality.  Prior right orchiectomy. Moderate left hydrocele and varicocele. Electronically Signed   By: Rolm Baptise M.D.   On: 04/11/2019 02:16   CT ABDOMEN PELVIS W CONTRAST  Result Date: 04/18/2019 CLINICAL DATA:  Testicular carcinoma post RIGHT orchiectomy, LEFT lower extremity DVT, abdominal distension, recent CT with intra-abdominal and retroperitoneal lymphadenopathy concerning for metastatic cancer EXAM: CT ABDOMEN AND PELVIS WITH CONTRAST TECHNIQUE: Multidetector CT imaging of the abdomen and pelvis was performed using the standard protocol following bolus administration of intravenous contrast. Sagittal and coronal MPR images reconstructed from axial data set. CONTRAST:  134m OMNIPAQUE IOHEXOL 300 MG/ML SOLN IV. No oral contrast. COMPARISON:  04/11/2019 FINDINGS: Lower chest: Dependent bibasilar atelectasis in the lower lobes Hepatobiliary: New dependent density within gallbladder question vicarious excretion of contrast material from prior CT exam versus sludge. No gallbladder wall thickening. Liver unremarkable. Pancreas: Normal appearance Spleen: Normal appearance Adrenals/Urinary Tract: Adrenal glands, kidneys, and ureters normal appearance. Foley catheter and air within urinary bladder. Stomach/Bowel: Retrocecal appendix, normal. Gaseous distention of colon from cecum to descending colon. Small amount of stool in fluid within proximal colon. Tapering of the distal colon with few scattered distal colonic diverticula but no evidence of diverticulitis. Rectosigmoid colon decompressed. No discrete point of obstruction seen; findings favor ileus. Stomach and small bowel loops unremarkable Vascular/Lymphatic: Prior stenting of the IVC. Aorta normal caliber  without aneurysm. IVC, iliac veins, common femoral veins patent without thrombus. Previously identified thrombus LEFT external iliac vein no longer seen. Again identified significant retroperitoneal adenopathy unchanged. Reproductive: Seminal vesicles unremarkable. Minimal prostatic prominence for age. Other: Scattered stranding of retroperitoneal tissue planes increased from previous exam. No free air or free fluid. Tiny LEFT inguinal hernia containing fat Musculoskeletal: Osseous structures unremarkable. New enlargement of the LEFT psoas muscle with in ill-defined area of central low attenuation approximately 3.7 x 3.9 cm in axial dimensions extending 12 cm length, could represent LEFT psoas abscess or hematoma. This is new since 04/11/2019 making tumor infiltration unlikely. IMPRESSION: Mild gaseous distention of the colon through descending colon with decompressed rectosigmoid colon; no point of obstruction is identified, favor ileus. Persistent retroperitoneal adenopathy suspicious for metastatic disease. New ill-defined fluid collection within LEFT psoas muscle 3.7 x 3.9 x 12 cm question psoas abscess versus hematoma; LEFT psoas muscle was traversed by prior CT guided retroperitoneal lymph node biopsy. Increased retroperitoneal and mesenteric stranding. Bibasilar atelectasis. Findings called to Dr. MZigmund Danielon 04/18/2019 at 1108 hours. Electronically Signed   By: MLavonia DanaM.D.   On: 04/18/2019 11:08   CT ABDOMEN PELVIS W CONTRAST  Result Date: 04/11/2019 CLINICAL DATA:  Positive lower extremity DVT, PE suspected. Back pain and leg swelling, history of testicular cancer. EXAM: CT ANGIOGRAPHY CHEST CT ABDOMEN AND PELVIS WITH CONTRAST TECHNIQUE: Multidetector CT imaging of the chest was performed using the standard protocol during bolus administration of intravenous contrast. Multiplanar CT image reconstructions and MIPs were obtained to evaluate the vascular anatomy. Multidetector CT imaging of the  abdomen and pelvis was performed using the standard protocol during bolus administration of intravenous contrast. CONTRAST:  1033mOMNIPAQUE IOHEXOL 350 MG/ML SOLN COMPARISON:  None. FINDINGS: CTA CHEST FINDINGS Cardiovascular: Suboptimal opacification of the pulmonary arteries limits evaluation beyond the lobar level. No large central or lobar pulmonary arterial filling defects are identified. Central pulmonary arteries are normal caliber. Normal cardiac size. 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 The aorta is normal caliber. Shared origin  of the brachiocephalic and left common carotid arteries. Great vessels are otherwise unremarkable. Mediastinum/Nodes: No enlarged mediastinal, hilar or axillary adenopathy is seen. No acute abnormality of the trachea or esophagus. Thyroid gland is normal. Asymmetric thickening of the left sternocleidomastoid is likely related to muscle activation. Lungs/Pleura: Respiratory motion artifact is mild. No consolidation, features of edema, pneumothorax, or effusion. No suspicious pulmonary nodules or masses. Musculoskeletal: No acute osseous abnormality or suspicious osseous lesion in the imaged chest. No worrisome chest wall lesions. Mild bilateral gynecomastia is noted, right slightly greater left. Review of the MIP images confirms the above findings. CT ABDOMEN and PELVIS FINDINGS Hepatobiliary: No focal liver abnormality is seen. No gallstones, gallbladder wall thickening, or biliary dilatation. Pancreas: Unremarkable. No pancreatic ductal dilatation or surrounding inflammatory changes. Spleen: Normal in size without focal abnormality. Adrenals/Urinary Tract: Adrenal glands are unremarkable. Kidneys are normal, without renal calculi, focal lesion, or hydronephrosis. Bladder is unremarkable. Stomach/Bowel: Distal esophagus, stomach and duodenal sweep are unremarkable. No small bowel wall thickening or  dilatation. No evidence of obstruction. A normal appendix is visualized. No colonic dilatation or wall thickening. Scattered colonic diverticula without focal pericolonic inflammation to suggest diverticulitis. Vascular/Lymphatic: There is extensive retroperitoneal and pelvic adenopathy, some of the larger nodes include a left periaortic node measuring up to 3.2 cm (2/43) and a 2 cm aortocaval lymph node (2/35) there is distal abdominal aortic encasement by a extensive retroperitoneal adenopathy. Furthermore there is likely direct compression and/or direct nodal involvement of the inferior vena cava (2/40 with the lower IVC appearing expanded by hypoattenuating filling defects. Edematous changes are noted throughout the pelvis with venous engorgement and collateralization. Reproductive: The prostate and seminal vesicles are unremarkable. There is a varicocele seen in the left scrotum. External genitalia are incompletely included on this exam. Other: There is circumferential body wall edema from the level of the pelvis inferiorly more pronounced throughout the left lower extremity. Free fluid is seen in the deep pelvis, likely reactive or secondary to venous occlusion with developing collateralization. Musculoskeletal: No acute osseous abnormality or suspicious osseous lesion. Normal symmetric muscular enhancement. Review of the MIP images confirms the above findings. IMPRESSION: 1. Suboptimal opacification of the pulmonary arteries limits evaluation beyond the lobar level. No large central or lobar pulmonary arterial filling defects are identified. 2. Extensive retroperitoneal and pelvic adenopathy. There is likely direct compression of the lower IVC with the distal IVC and iliac veins appearing expanded by hypoattenuating thrombus. There is complete encasement of the abdominal aorta as well but without luminal narrowing. Findings are highly concerning for metastatic disease, particularly given patient history of  testicular cancer. 3. Left scrotal varicocele. External genitalia are incompletely included on this exam. Consider further evaluation scrotal ultrasound given extensive retroperitoneal adenopathy and patient's history of testicular cancer. 4. Free fluid in the deep pelvis is likely reactive or secondary to venous occlusion with developing collateralization. 5. Colonic diverticulosis without evidence of diverticulitis. 6. Mild bilateral gynecomastia, right slightly greater left. 7. 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 Critical Value/emergent results were called by telephone at the time of interpretation on 04/11/2019 at 1:33 am to Chance , who verbally acknowledged these results. Electronically Signed   By: Lovena Le M.D.   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 Patient has experienced minimal improvement since initiation of IV heparin, and as such request made for initiation of catheter directed thrombolysis for symptomatic purposes. EXAM: 1. ULTRASOUND GUIDANCE FOR VASCULAR ACCESS X2 2. FLUOROSCOPIC GUIDED PHARMACOLOGICAL AND MECHANICAL THROMBECTOMY WITH ANGIOJET DEVICE 3. BILATERAL LOWER EXTREMITY VENOGRAM AND PLACEMENT OF INFUSION CATHETERS COMPARISON:  CT chest, abdomen pelvis-04/11/2019; abdominal MRV - 04/13/2019 MEDICATIONS: Hydralazine 10 mg IV, administered near the completion of the procedure secondary to persistently elevated blood pressure. CONTRAST:  60 cc Omnipaque 300 ANESTHESIA/SEDATION: Moderate (conscious) sedation was employed during this procedure. A total of Versed 3 mg and Fentanyl 125 mcg was administered intravenously. Moderate Sedation Time: 67 minutes. The patient's level  of consciousness and vital signs were monitored continuously by radiology nursing throughout the procedure under my direct supervision. FLUOROSCOPY TIME:  18 minutes, 18 seconds (542 mGy) COMPLICATIONS: None immediate. TECHNIQUE: Informed written consent was obtained from the patient after a discussion of the risks (including but not limited to bleeding, including nontarget bleeding given history of malignancy, infection, pulmonary embolism and necessity for venous angioplasty and/or stent placement), benefits and alternatives to treatment. Questions regarding the procedure were encouraged and answered. A timeout was performed prior to the initiation of the procedure. The patient was placed prone on the fluoroscopy table and the skin posterior to the bilateral knees was prepped and draped in the usual sterile fashion, and a sterile drape was applied covering the operative field. Maximum barrier sterile technique with sterile gowns and gloves were used for the procedure. Under direct ultrasound guidance, the left popliteal vein was access with a micropuncture kit after the overlying soft tissues were anesthetized with 1% lidocaine. An ultrasound image was saved for documentation purposed. This allowed for placement of a 8-French vascular sheath. A limited venogram was performed through the side arm of the vascular sheath. With the use of a regular glidewire, a Kumpe catheter was advanced through the left femoral, external iliac vein and through the left common iliac vein to the level of the IVC. Limited vena cavogram demonstrates complete occlusion of the distal aspect of the IVC extending to involve both left common iliac veins. Ultimately, a moderate to long segment apparently extrinsic malignant narrowing involving the mid/distal aspect of the IVC was successfully traversed with inferior venogram demonstrating patency of the more cranial infrarenal IVC. Next, with the use of a Angiojet device, approximately 250 cc  of a solution of 10 mg of tPA mixed in 500 mg of saline was infused throughout the occluded segment of the IVC, left common and external iliac veins to the level of the left common femoral vein. Attention was now paid towards access of the right lower extremity popliteal vein. After the overlying soft tissues were anesthetized with 1% lidocaine, the right popliteal vein was accessed micropuncture kit. Limited venogram was performed through the outer micro puncture sheath. This allowed for placement of a 6 French, 35 cm vascular sheath. Again, with the use of a regular glidewire, a Kumpe catheter was advanced beyond the obstructing caval lesion. Contrast injection confirmed appropriate positioning. Next, after approximately 20 minute tPA dwell, mechanical thrombectomy was performed with the Angiojet device both from the left lower extremity access as well as the right lower extremity access. Limited vena cavagram was performed via the right lower extremity access. Kumpe catheter was utilized for measurement purposes and ultimately a 90 cm/20-cm infusion catheter was placed from the right lower extremity access and a 90 cm/30 cm infusion  catheter was placed from the left lower extremity access adequately covering the occluded segment of the bilateral venous systems as well as the caudal aspect of the IVC. Postprocedural spot fluoroscopic images were obtained. Both popliteal vascular sheath were secured in place. Dressings were applied. Patient's was subjectively cold by the end of the procedure with associated elevation in his blood pressure and as such he was administered 10 mg of hydralazine and bear hugger warming device was applied. The patient otherwise tolerated the procedure well without immediate postprocedural complication. FINDINGS: Sonographic evaluation demonstrates patency of the bilateral popliteal veins. Contrast injection demonstrates patency of the bilateral popliteal and superficial femoral veins.  There is occlusive thrombus extending from the left common femoral vein and the right common iliac vein through the mid/caudal aspect of the IVC secondary to a moderate to long segment length presumably extrinsic malignant occlusion secondary to known adjacent bulky retroperitoneal lymphadenopathy. Following pharmacological and mechanical thrombectomy as detailed above, there is restored flow as demonstrated on limited right central pelvic venogram with large amount of residual thrombus. Following infusion catheter placement, both side ports traverse both occluded segments of the IVC and pelvic venous systems. IMPRESSION: 1. 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 2. Successful initiation of bilateral mechanical and pharmacologic thrombectomy with bilateral infusion catheters appropriately positioned. PLAN: The patient will be transferred to ICU for overnight lytic infusion and will return tomorrow for repeat venogram and potential intervention. Electronically Signed   By: Sandi Mariscal M.D.   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 Patient has experienced minimal improvement since initiation of IV heparin, and as such request made for initiation of catheter directed thrombolysis for symptomatic purposes. EXAM: 1. ULTRASOUND GUIDANCE FOR VASCULAR ACCESS X2 2. FLUOROSCOPIC GUIDED PHARMACOLOGICAL AND MECHANICAL THROMBECTOMY WITH ANGIOJET DEVICE 3. BILATERAL LOWER EXTREMITY VENOGRAM AND PLACEMENT OF INFUSION CATHETERS COMPARISON:  CT chest, abdomen pelvis-04/11/2019; abdominal MRV - 04/13/2019 MEDICATIONS: Hydralazine 10 mg IV, administered near the completion of the  procedure secondary to persistently elevated blood pressure. CONTRAST:  60 cc Omnipaque 300 ANESTHESIA/SEDATION: Moderate (conscious) sedation was employed during this procedure. A total of Versed 3 mg and Fentanyl 125 mcg was administered intravenously. Moderate Sedation Time: 67 minutes. The patient's level of consciousness and vital signs were monitored continuously by radiology nursing throughout the procedure under my direct supervision. FLUOROSCOPY TIME:  18 minutes, 18 seconds (242 mGy) COMPLICATIONS: None immediate. TECHNIQUE: Informed written consent was obtained from the patient after a discussion of the risks (including but not limited to bleeding, including nontarget bleeding given history of malignancy, infection, pulmonary embolism and necessity for venous angioplasty and/or stent placement), benefits and alternatives to treatment. Questions regarding the procedure were encouraged and answered. A timeout was performed prior to the initiation of the procedure. The patient was placed prone on the fluoroscopy table and the skin posterior to the bilateral knees was prepped and draped in the usual sterile fashion, and a sterile drape was applied covering the operative field. Maximum barrier sterile technique with sterile gowns and gloves were used for the procedure. Under direct ultrasound guidance, the left popliteal vein was access with a micropuncture kit after the overlying soft tissues were anesthetized with 1% lidocaine. An ultrasound  image was saved for documentation purposed. This allowed for placement of a 8-French vascular sheath. A limited venogram was performed through the side arm of the vascular sheath. With the use of a regular glidewire, a Kumpe catheter was advanced through the left femoral, external iliac vein and through the left common iliac vein to the level of the IVC. Limited vena cavogram demonstrates complete occlusion of the distal aspect of the IVC extending to involve both left  common iliac veins. Ultimately, a moderate to long segment apparently extrinsic malignant narrowing involving the mid/distal aspect of the IVC was successfully traversed with inferior venogram demonstrating patency of the more cranial infrarenal IVC. Next, with the use of a Angiojet device, approximately 250 cc of a solution of 10 mg of tPA mixed in 500 mg of saline was infused throughout the occluded segment of the IVC, left common and external iliac veins to the level of the left common femoral vein. Attention was now paid towards access of the right lower extremity popliteal vein. After the overlying soft tissues were anesthetized with 1% lidocaine, the right popliteal vein was accessed micropuncture kit. Limited venogram was performed through the outer micro puncture sheath. This allowed for placement of a 6 French, 35 cm vascular sheath. Again, with the use of a regular glidewire, a Kumpe catheter was advanced beyond the obstructing caval lesion. Contrast injection confirmed appropriate positioning. Next, after approximately 20 minute tPA dwell, mechanical thrombectomy was performed with the Angiojet device both from the left lower extremity access as well as the right lower extremity access. Limited vena cavagram was performed via the right lower extremity access. Kumpe catheter was utilized for measurement purposes and ultimately a 90 cm/20-cm infusion catheter was placed from the right lower extremity access and a 90 cm/30 cm infusion catheter was placed from the left lower extremity access adequately covering the occluded segment of the bilateral venous systems as well as the caudal aspect of the IVC. Postprocedural spot fluoroscopic images were obtained. Both popliteal vascular sheath were secured in place. Dressings were applied. Patient's was subjectively cold by the end of the procedure with associated elevation in his blood pressure and as such he was administered 10 mg of hydralazine and bear hugger  warming device was applied. The patient otherwise tolerated the procedure well without immediate postprocedural complication. FINDINGS: Sonographic evaluation demonstrates patency of the bilateral popliteal veins. Contrast injection demonstrates patency of the bilateral popliteal and superficial femoral veins. There is occlusive thrombus extending from the left common femoral vein and the right common iliac vein through the mid/caudal aspect of the IVC secondary to a moderate to long segment length presumably extrinsic malignant occlusion secondary to known adjacent bulky retroperitoneal lymphadenopathy. Following pharmacological and mechanical thrombectomy as detailed above, there is restored flow as demonstrated on limited right central pelvic venogram with large amount of residual thrombus. Following infusion catheter placement, both side ports traverse both occluded segments of the IVC and pelvic venous systems. IMPRESSION: 1. 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 2. Successful initiation of bilateral mechanical and pharmacologic thrombectomy with bilateral infusion catheters appropriately positioned. PLAN: The patient will be transferred to ICU for overnight lytic infusion and will return tomorrow for repeat venogram and potential intervention. Electronically Signed   By: Sandi Mariscal M.D.   On: 04/15/2019 17:04   US RENAL  Result Date: 04/22/2019 CLINICAL DATA:  Elevated serum creatinine level.  EXAM: RENAL / URINARY TRACT ULTRASOUND COMPLETE COMPARISON:  April 17, 2019. FINDINGS: Right Kidney: Renal measurements: 13.9 x 6.4 x 6.3 cm = volume: 294 mL . Echogenicity within normal limits. No mass or hydronephrosis visualized. Left Kidney: Renal measurements: 12.2 x 6.3 x 6.2 cm = volume: 246 mL. Echogenicity within normal limits. No mass or hydronephrosis visualized. Bladder: Appears normal  for degree of bladder distention. Foley catheter is noted. Other: None. IMPRESSION: No significant renal abnormality is noted. Electronically Signed   By: Marijo Conception M.D.   On: 04/22/2019 16:53   US RENAL  Result Date: 04/17/2019 CLINICAL DATA:  Urinary retention.  History of testicular carcinoma. EXAM: RENAL / URINARY TRACT ULTRASOUND COMPLETE COMPARISON:  CT scan 04/11/2019 FINDINGS: Right Kidney: Renal measurements: 13.5 x 5.5 x 6.6 cm = volume: 260.0 mL . Normal renal cortical thickness and echogenicity without focal lesions or hydronephrosis. Left Kidney: Renal measurements: 12.0 x 6.8 x 5.4 cm = volume: 229.4 mL. Normal renal cortical thickness and echogenicity without focal lesions or hydronephrosis. Bladder: Decompressed by Foley catheter. Other: None. IMPRESSION: Normal sonographic appearance of both kidneys.  No hydronephrosis. Bladder decompressed by a Foley catheter. Electronically Signed   By: Marijo Sanes M.D.   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  Patient has experienced minimal improvement since initiation of IV heparin, and as such request made for initiation of catheter directed thrombolysis for symptomatic purposes.  EXAM: 1. ULTRASOUND GUIDANCE FOR VASCULAR ACCESS X2 2. FLUOROSCOPIC GUIDED PHARMACOLOGICAL AND MECHANICAL THROMBECTOMY WITH ANGIOJET DEVICE 3. BILATERAL LOWER EXTREMITY VENOGRAM AND PLACEMENT OF INFUSION CATHETERS  COMPARISON:  CT chest, abdomen pelvis-04/11/2019; abdominal MRV - 04/13/2019  MEDICATIONS: Hydralazine 10 mg IV, administered near the completion of the procedure secondary to persistently elevated blood pressure.  CONTRAST:  60 cc Omnipaque 300  ANESTHESIA/SEDATION: Moderate (conscious) sedation was employed  during this procedure. A total of Versed 3 mg and Fentanyl 125 mcg was administered intravenously.  Moderate Sedation Time: 67 minutes. The patient's level of consciousness and vital signs were monitored continuously by radiology nursing throughout the procedure under my direct supervision.  FLUOROSCOPY TIME:  18 minutes, 18 seconds (938 mGy)  COMPLICATIONS: None immediate.  TECHNIQUE: Informed written consent was obtained from the patient after a discussion of the risks (including but not limited to bleeding, including nontarget bleeding given history of malignancy, infection, pulmonary embolism and necessity for venous angioplasty and/or stent placement), benefits and alternatives to treatment. Questions regarding the procedure were encouraged and answered. A timeout was performed prior to the initiation of the procedure.  The patient was placed prone on the fluoroscopy table and the skin posterior to the bilateral knees was prepped and draped in the usual sterile fashion, and a sterile drape was applied covering the operative field. Maximum barrier sterile technique with sterile gowns and gloves were used for the procedure.  Under direct ultrasound guidance, the left popliteal vein was access with a micropuncture kit after the overlying soft tissues were anesthetized with 1% lidocaine. An ultrasound image was saved for documentation purposed. This allowed for placement of a 8-French vascular sheath. A limited venogram was performed through the side arm of the vascular sheath.  With the use of a regular glidewire, a Kumpe catheter was advanced through the left femoral, external iliac  vein and through the left common iliac vein to the level of the IVC.  Limited vena cavogram demonstrates complete occlusion of the distal aspect of the IVC extending to involve both left common iliac veins.  Ultimately, a moderate to long segment apparently extrinsic malignant narrowing involving the mid/distal aspect of the  IVC was successfully traversed with inferior venogram demonstrating patency of the more cranial infrarenal IVC.  Next, with the use of a Angiojet device, approximately 250 cc of a solution of 10 mg of tPA mixed in 500 mg of saline was infused throughout the occluded segment of the IVC, left common and external iliac veins to the level of the left common femoral vein.  Attention was now paid towards access of the right lower extremity popliteal vein.  After the overlying soft tissues were anesthetized with 1% lidocaine, the right popliteal vein was accessed micropuncture kit. Limited venogram was performed through the outer micro puncture sheath. This allowed for placement of a 6 French, 35 cm vascular sheath.  Again, with the use of a regular glidewire, a Kumpe catheter was advanced beyond the obstructing caval lesion. Contrast injection confirmed appropriate positioning.  Next, after approximately 20 minute tPA dwell, mechanical thrombectomy was performed with the Angiojet device both from the left lower extremity access as well as the right lower extremity access.  Limited vena cavagram was performed via the right lower extremity access.  Kumpe catheter was utilized for measurement purposes and ultimately a 90 cm/20-cm infusion catheter was placed from the right lower extremity access and a 90 cm/30 cm infusion catheter was placed from the left lower extremity access adequately covering the occluded segment of the bilateral venous systems as well as the caudal aspect of the IVC.  Postprocedural spot fluoroscopic images were obtained.  Both popliteal vascular sheath were secured in place. Dressings were applied.  Patient's was subjectively cold by the end of the procedure with associated elevation in his blood pressure and as such he was administered 10 mg of hydralazine and bear hugger warming device was applied.  The patient otherwise tolerated the procedure well without immediate postprocedural  complication.  FINDINGS: Sonographic evaluation demonstrates patency of the bilateral popliteal veins.  Contrast injection demonstrates patency of the bilateral popliteal and superficial femoral veins.  There is occlusive thrombus extending from the left common femoral vein and the right common iliac vein through the mid/caudal aspect of the IVC secondary to a moderate to long segment length presumably extrinsic malignant occlusion secondary to known adjacent bulky retroperitoneal lymphadenopathy.  Following pharmacological and mechanical thrombectomy as detailed above, there is restored flow as demonstrated on limited right central pelvic venogram with large amount of residual thrombus.  Following infusion catheter placement, both side ports traverse both occluded segments of the IVC and pelvic venous systems.  IMPRESSION: 1. 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 2. Successful initiation of bilateral mechanical and pharmacologic thrombectomy with bilateral infusion catheters appropriately positioned.  PLAN: The patient will be transferred to ICU for overnight lytic infusion and will return tomorrow for repeat venogram and potential intervention.   Electronically Signed   By: Sandi Mariscal M.D.   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  Patient has experienced minimal improvement since initiation of IV heparin, and as such request made for initiation of catheter directed thrombolysis for symptomatic purposes. EXAM: 1. ULTRASOUND GUIDANCE FOR VASCULAR ACCESS X2 2. FLUOROSCOPIC GUIDED PHARMACOLOGICAL AND MECHANICAL THROMBECTOMY WITH ANGIOJET  DEVICE 3. BILATERAL LOWER EXTREMITY VENOGRAM AND PLACEMENT OF INFUSION CATHETERS COMPARISON:  CT chest, abdomen pelvis-04/11/2019; abdominal MRV - 04/13/2019 MEDICATIONS: Hydralazine 10 mg IV, administered near the completion of the procedure secondary to persistently elevated blood pressure. CONTRAST:  60 cc Omnipaque 300 ANESTHESIA/SEDATION: Moderate (conscious) sedation was employed during this procedure. A total of Versed 3 mg and Fentanyl 125 mcg was administered intravenously. Moderate Sedation Time: 67 minutes. The patient's level of consciousness and vital signs were monitored continuously by radiology nursing throughout the procedure under my direct supervision. FLUOROSCOPY TIME:  18 minutes, 18 seconds (891 mGy) COMPLICATIONS: None immediate. TECHNIQUE: Informed written consent was obtained from the patient after a discussion of the risks (including but not limited to bleeding, including nontarget bleeding given history of malignancy, infection, pulmonary embolism and necessity for venous angioplasty and/or stent placement), benefits and alternatives to treatment. Questions regarding the procedure were encouraged and answered. A timeout was performed prior to the initiation of the procedure. The patient was placed prone on the fluoroscopy table and the skin posterior to the bilateral knees was prepped and draped in the usual sterile fashion, and a sterile drape was applied covering the operative field. Maximum barrier sterile technique with sterile gowns and gloves were used for the procedure. Under direct ultrasound guidance, the left popliteal vein was access with a micropuncture kit after the overlying soft tissues were anesthetized with 1% lidocaine. An ultrasound image was saved for documentation purposed. This allowed for placement of a 8-French vascular sheath. A limited venogram was performed through the side arm of the vascular sheath. With the use of a regular glidewire, a Kumpe catheter was  advanced through the left femoral, external iliac vein and through the left common iliac vein to the level of the IVC. Limited vena cavogram demonstrates complete occlusion of the distal aspect of the IVC extending to involve both left common iliac veins. Ultimately, a moderate to long segment apparently extrinsic malignant narrowing involving the mid/distal aspect of the IVC was successfully traversed with inferior venogram demonstrating patency of the more cranial infrarenal IVC. Next, with the use of a Angiojet device, approximately 250 cc of a solution of 10 mg of tPA mixed in 500 mg of saline was infused throughout the occluded segment of the IVC, left common and external iliac veins to the level of the left common femoral vein. Attention was now paid towards access of the right lower extremity popliteal vein. After the overlying soft tissues were anesthetized with 1% lidocaine, the right popliteal vein was accessed micropuncture kit. Limited venogram was performed through the outer micro puncture sheath. This allowed for placement of a 6 French, 35 cm vascular sheath. Again, with the use of a regular glidewire, a Kumpe catheter was advanced beyond the obstructing caval lesion. Contrast injection confirmed appropriate positioning. Next, after approximately 20 minute tPA dwell, mechanical thrombectomy was performed with the Angiojet device both from the left lower extremity access as well as the right lower extremity access. Limited vena cavagram was performed via the right lower extremity access. Kumpe catheter was utilized for measurement purposes and ultimately a 90 cm/20-cm infusion catheter was placed from the right lower extremity access and a 90 cm/30 cm infusion catheter was placed from the left lower  extremity access adequately covering the occluded segment of the bilateral venous systems as well as the caudal aspect of the IVC. Postprocedural spot fluoroscopic images were obtained. Both popliteal  vascular sheath were secured in place. Dressings were applied. Patient's was subjectively cold by the end of the procedure with associated elevation in his blood pressure and as such he was administered 10 mg of hydralazine and bear hugger warming device was applied. The patient otherwise tolerated the procedure well without immediate postprocedural complication. FINDINGS: Sonographic evaluation demonstrates patency of the bilateral popliteal veins. Contrast injection demonstrates patency of the bilateral popliteal and superficial femoral veins. There is occlusive thrombus extending from the left common femoral vein and the right common iliac vein through the mid/caudal aspect of the IVC secondary to a moderate to long segment length presumably extrinsic malignant occlusion secondary to known adjacent bulky retroperitoneal lymphadenopathy. Following pharmacological and mechanical thrombectomy as detailed above, there is restored flow as demonstrated on limited right central pelvic venogram with large amount of residual thrombus. Following infusion catheter placement, both side ports traverse both occluded segments of the IVC and pelvic venous systems. IMPRESSION: 1. 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 2. Successful initiation of bilateral mechanical and pharmacologic thrombectomy with bilateral infusion catheters appropriately positioned. PLAN: The patient will be transferred to ICU for overnight lytic infusion and will return tomorrow for repeat venogram and potential intervention. Electronically Signed   By: Sandi Mariscal M.D.   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 Patient has experienced minimal improvement since initiation of IV heparin, and as such request made for initiation of catheter directed thrombolysis for symptomatic purposes. EXAM: 1. ULTRASOUND GUIDANCE FOR VASCULAR ACCESS X2 2. FLUOROSCOPIC GUIDED PHARMACOLOGICAL AND MECHANICAL THROMBECTOMY WITH ANGIOJET DEVICE 3. BILATERAL LOWER EXTREMITY VENOGRAM AND PLACEMENT OF INFUSION CATHETERS COMPARISON:  CT chest, abdomen pelvis-04/11/2019; abdominal MRV - 04/13/2019 MEDICATIONS: Hydralazine 10 mg IV, administered near the completion of the procedure secondary to persistently elevated blood pressure. CONTRAST:  60 cc Omnipaque 300 ANESTHESIA/SEDATION: Moderate (conscious) sedation was employed during this procedure. A total of Versed 3 mg and Fentanyl 125 mcg was administered intravenously. Moderate Sedation Time: 67 minutes. The patient's level of consciousness and vital signs were monitored continuously by radiology nursing throughout the procedure under my direct supervision. FLUOROSCOPY TIME:  18 minutes, 18 seconds (672 mGy) COMPLICATIONS: None immediate. TECHNIQUE: Informed written consent was obtained from the patient after a discussion of the risks (including but not limited to bleeding, including nontarget bleeding given history of malignancy, infection, pulmonary embolism and necessity for venous angioplasty and/or stent placement), benefits and alternatives to treatment. Questions regarding the procedure were encouraged and answered. A timeout was performed prior to the initiation of the procedure. The patient was placed prone on the fluoroscopy table and the skin posterior to the bilateral knees was prepped and draped in the usual sterile fashion, and a sterile drape was applied covering the operative field. Maximum barrier sterile technique with sterile gowns and gloves were used for the procedure. Under direct ultrasound guidance, the left popliteal vein was access with a micropuncture  kit after the overlying soft tissues were anesthetized with 1% lidocaine. An ultrasound image was saved  for documentation purposed. This allowed for placement of a 8-French vascular sheath. A limited venogram was performed through the side arm of the vascular sheath. With the use of a regular glidewire, a Kumpe catheter was advanced through the left femoral, external iliac vein and through the left common iliac vein to the level of the IVC. Limited vena cavogram demonstrates complete occlusion of the distal aspect of the IVC extending to involve both left common iliac veins. Ultimately, a moderate to long segment apparently extrinsic malignant narrowing involving the mid/distal aspect of the IVC was successfully traversed with inferior venogram demonstrating patency of the more cranial infrarenal IVC. Next, with the use of a Angiojet device, approximately 250 cc of a solution of 10 mg of tPA mixed in 500 mg of saline was infused throughout the occluded segment of the IVC, left common and external iliac veins to the level of the left common femoral vein. Attention was now paid towards access of the right lower extremity popliteal vein. After the overlying soft tissues were anesthetized with 1% lidocaine, the right popliteal vein was accessed micropuncture kit. Limited venogram was performed through the outer micro puncture sheath. This allowed for placement of a 6 French, 35 cm vascular sheath. Again, with the use of a regular glidewire, a Kumpe catheter was advanced beyond the obstructing caval lesion. Contrast injection confirmed appropriate positioning. Next, after approximately 20 minute tPA dwell, mechanical thrombectomy was performed with the Angiojet device both from the left lower extremity access as well as the right lower extremity access. Limited vena cavagram was performed via the right lower extremity access. Kumpe catheter was utilized for measurement purposes and ultimately a 90 cm/20-cm infusion  catheter was placed from the right lower extremity access and a 90 cm/30 cm infusion catheter was placed from the left lower extremity access adequately covering the occluded segment of the bilateral venous systems as well as the caudal aspect of the IVC. Postprocedural spot fluoroscopic images were obtained. Both popliteal vascular sheath were secured in place. Dressings were applied. Patient's was subjectively cold by the end of the procedure with associated elevation in his blood pressure and as such he was administered 10 mg of hydralazine and bear hugger warming device was applied. The patient otherwise tolerated the procedure well without immediate postprocedural complication. FINDINGS: Sonographic evaluation demonstrates patency of the bilateral popliteal veins. Contrast injection demonstrates patency of the bilateral popliteal and superficial femoral veins. There is occlusive thrombus extending from the left common femoral vein and the right common iliac vein through the mid/caudal aspect of the IVC secondary to a moderate to long segment length presumably extrinsic malignant occlusion secondary to known adjacent bulky retroperitoneal lymphadenopathy. Following pharmacological and mechanical thrombectomy as detailed above, there is restored flow as demonstrated on limited right central pelvic venogram with large amount of residual thrombus. Following infusion catheter placement, both side ports traverse both occluded segments of the IVC and pelvic venous systems. IMPRESSION: 1. 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 2. Successful initiation of bilateral mechanical and pharmacologic thrombectomy with bilateral infusion catheters appropriately positioned. PLAN: The patient will be transferred to ICU for overnight lytic infusion and will return tomorrow for repeat venogram and potential  intervention. Electronically Signed   By: Sandi Mariscal M.D.   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 Patient has experienced minimal improvement since initiation of IV heparin, and as such request made for initiation of catheter directed thrombolysis for symptomatic purposes. EXAM: 1. ULTRASOUND GUIDANCE FOR VASCULAR ACCESS X2 2. FLUOROSCOPIC GUIDED PHARMACOLOGICAL AND MECHANICAL THROMBECTOMY WITH ANGIOJET DEVICE 3. BILATERAL LOWER EXTREMITY VENOGRAM AND PLACEMENT OF INFUSION CATHETERS COMPARISON:  CT chest, abdomen pelvis-04/11/2019; abdominal MRV - 04/13/2019 MEDICATIONS: Hydralazine 10 mg IV, administered near the completion of the procedure secondary to persistently elevated blood pressure. CONTRAST:  60 cc Omnipaque 300 ANESTHESIA/SEDATION: Moderate (conscious) sedation was employed during this procedure. A total of Versed 3 mg and Fentanyl 125 mcg was administered intravenously. Moderate Sedation Time: 67 minutes. The patient's level of consciousness and vital signs were monitored continuously by radiology nursing throughout the procedure under my direct supervision. FLUOROSCOPY TIME:  18 minutes, 18 seconds (502 mGy) COMPLICATIONS: None immediate. TECHNIQUE: Informed written consent was obtained from the patient after a discussion of the risks (including but not limited to bleeding, including nontarget bleeding given history of malignancy, infection, pulmonary embolism and necessity for venous angioplasty and/or stent placement), benefits and alternatives to treatment. Questions regarding the procedure were encouraged and answered. A timeout was performed prior to the initiation of the procedure. The patient was placed prone on the fluoroscopy table and the skin  posterior to the bilateral knees was prepped and draped in the usual sterile fashion, and a sterile drape was applied covering the operative field. Maximum barrier sterile technique with sterile gowns and gloves were used for the procedure. Under direct ultrasound guidance, the left popliteal vein was access with a micropuncture kit after the overlying soft tissues were anesthetized with 1% lidocaine. An ultrasound image was saved for documentation purposed. This allowed for placement of a 8-French vascular sheath. A limited venogram was performed through the side arm of the vascular sheath. With the use of a regular glidewire, a Kumpe catheter was advanced through the left femoral, external iliac vein and through the left common iliac vein to the level of the IVC. Limited vena cavogram demonstrates complete occlusion of the distal aspect of the IVC extending to involve both left common iliac veins. Ultimately, a moderate to long segment apparently extrinsic malignant narrowing involving the mid/distal aspect of the IVC was successfully traversed with inferior venogram demonstrating patency of the more cranial infrarenal IVC. Next, with the use of a Angiojet device, approximately 250 cc of a solution of 10 mg of tPA mixed in 500 mg of saline was infused throughout the occluded segment of the IVC, left common and external iliac veins to the level of the left common femoral vein. Attention was now paid towards access of the right lower extremity popliteal vein. After the overlying soft tissues were anesthetized with 1% lidocaine, the right popliteal vein was accessed micropuncture kit. Limited venogram was performed through the outer micro puncture sheath. This allowed for placement of a 6 French, 35 cm vascular sheath. Again, with the use of a regular glidewire, a Kumpe catheter was advanced beyond the obstructing caval lesion. Contrast injection confirmed appropriate positioning. Next, after approximately 20 minute  tPA dwell, mechanical thrombectomy was performed with the Angiojet device both from the left lower extremity access as well as the right lower extremity access. Limited vena cavagram was performed via the right lower extremity access. Kumpe catheter was utilized for measurement purposes and ultimately a 90 cm/20-cm infusion catheter  was placed from the right lower extremity access and a 90 cm/30 cm infusion catheter was placed from the left lower extremity access adequately covering the occluded segment of the bilateral venous systems as well as the caudal aspect of the IVC. Postprocedural spot fluoroscopic images were obtained. Both popliteal vascular sheath were secured in place. Dressings were applied. Patient's was subjectively cold by the end of the procedure with associated elevation in his blood pressure and as such he was administered 10 mg of hydralazine and bear hugger warming device was applied. The patient otherwise tolerated the procedure well without immediate postprocedural complication. FINDINGS: Sonographic evaluation demonstrates patency of the bilateral popliteal veins. Contrast injection demonstrates patency of the bilateral popliteal and superficial femoral veins. There is occlusive thrombus extending from the left common femoral vein and the right common iliac vein through the mid/caudal aspect of the IVC secondary to a moderate to long segment length presumably extrinsic malignant occlusion secondary to known adjacent bulky retroperitoneal lymphadenopathy. Following pharmacological and mechanical thrombectomy as detailed above, there is restored flow as demonstrated on limited right central pelvic venogram with large amount of residual thrombus. Following infusion catheter placement, both side ports traverse both occluded segments of the IVC and pelvic venous systems. IMPRESSION: 1. 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 2. Successful initiation of bilateral mechanical and pharmacologic thrombectomy with bilateral infusion catheters appropriately positioned. PLAN: The patient will be transferred to ICU for overnight lytic infusion and will return tomorrow for repeat venogram and potential intervention. Electronically Signed   By: Sandi Mariscal M.D.   On: 04/15/2019 17:04   CT BIOPSY  Result Date: 04/12/2019 INDICATION: 39 year old male with a history of testicular cancer previously treated. He presents with new retroperitoneal adenopathy EXAM: CT BIOPSY MEDICATIONS: None. ANESTHESIA/SEDATION: Moderate (conscious) sedation was employed during this procedure. A total of Versed 2.0 mg and Fentanyl 100 mcg was administered intravenously. Moderate Sedation Time: 14 minutes. The patient's level of consciousness and vital signs were monitored continuously by radiology nursing throughout the procedure under my direct supervision. FLUOROSCOPY TIME:  CT COMPLICATIONS: None PROCEDURE: Informed written consent was obtained from the patient after a thorough discussion of the procedural risks, benefits and alternatives. All questions were addressed. Maximal Sterile Barrier Technique was utilized including caps, mask, sterile gowns, sterile gloves, sterile drape, hand hygiene and skin antiseptic. A timeout was performed prior to the initiation of the procedure. Patient positioned prone position on CT gantry table. Scout CT acquired for planning purposes. The patient was then prepped and draped in the usual sterile fashion. 1% lidocaine was used for local anesthesia. Introducer needle was then placed targeting the left-sided retroperitoneal lymphadenopathy. Once we confirmed needle tip position, multiple 18 gauge core biopsy were acquired. Specimen placed in the saline as S fresh specimen. Needle was removed and a final CT was acquired. Patient tolerated the procedure well and remained  hemodynamically stable throughout. No complications were encountered and no significant blood loss. IMPRESSION: Status post CT-guided biopsy of left retroperitoneal lymphadenopathy. Signed, Dulcy Fanny. Dellia Nims, RPVI [facility] Electronically Signed   By: [name] D.O.   On: 04/12/2019 12:56   DG Chest Port 1 View  Result Date: 04/24/2019 CLINICAL DATA:  39 year old male status post PICC placement. EXAM: PORTABLE CHEST 1 VIEW COMPARISON:  Chest radiograph dated 04/18/2019. FINDINGS: Left-sided PICC with tip over the right  atrium close to the cavoatrial junction. Faint bilateral lower lung field densities may represent atelectasis or atypical infection. Clinical correlation recommended. There is no pleural effusion or pneumothorax. The cardiac silhouette is within normal limits. No acute osseous pathology. IMPRESSION: 1. Left-sided PICC with tip over the right atrium. 2. Faint bilateral lower lung field atelectasis or developing infiltrate. Electronically Signed   By: Anner Crete M.D.   On: 04/24/2019 18:07   DG Chest Port 1 View  Result Date: 04/18/2019 CLINICAL DATA:  Fever, testicular cancer EXAM: PORTABLE CHEST 1 VIEW COMPARISON:  None. FINDINGS: Normal heart size. Normal mediastinal contour. No pneumothorax. No pleural effusion. Slightly low lung volumes. Minimal streaky opacities at both lung bases. No pulmonary edema. IMPRESSION: Slightly low lung volumes. Minimal streaky opacities at the lung bases, favoring minimal atelectasis. No consolidative airspace disease to suggest a pneumonia. Electronically Signed   By: Ilona Sorrel M.D.   On: 04/18/2019 15:41   DG Abd Portable 1V  Result Date: 04/25/2019 CLINICAL DATA:  Abdominal distension, history of testicular cancer, history of DVT EXAM: PORTABLE ABDOMEN - 1 VIEW COMPARISON:  04/21/2019, 04/22/2019 FINDINGS: 2 supine frontal views of the abdomen and pelvis demonstrate IVC stent  unchanged. Diffuse gaseous distension of the large and small bowel is stable consistent with ileus. No masses or abnormal calcifications. IMPRESSION: 1. Stable gaseous distension of the bowel compatible with ileus. Electronically Signed   By: Randa Ngo M.D.   On: 04/25/2019 16:22   DG Abd Portable 1V  Result Date: 04/18/2019 CLINICAL DATA:  Abdominal pain and distension EXAM: PORTABLE ABDOMEN - 1 VIEW COMPARISON:  04/11/2019 FINDINGS: Supine frontal view of the abdomen and pelvis excludes the hemidiaphragms, right flank, and lower pelvis by collimation. There is diffuse gaseous distention of the colon, likely reflecting postoperative ileus. Stable position of the IVC stent. No masses or abnormal calcifications. IMPRESSION: 1. Distended gas-filled colon consistent with postprocedural ileus. 2. IVC stent as above Electronically Signed   By: Randa Ngo M.D.   On: 04/18/2019 08:41   ECHOCARDIOGRAM COMPLETE  Result Date: 04/19/2019   ECHOCARDIOGRAM REPORT   Patient Name:   WASEEM SUESS Date of Exam: 04/19/2019 Medical Rec #:  [id number]      Height:       75.0 in Accession #:    [id number]     Weight:       216.9 lb Date of Birth:  Oct 23, 1980       BSA:          2.27 m Patient Age:    38 years       BP:           132/83 mmHg Patient Gender: M              HR:           125 bpm. Exam Location:  Inpatient Procedure: 2D Echo, Color Doppler and Cardiac Doppler                          MODIFIED REPORT: This report was modified by Cherlynn Kaiser MD on 04/19/2019 due to.  Indications:     Fever  History:         Patient has no prior history of Echocardiogram examinations.                  Testicular cancer; Signs/Symptoms:Fever.  Sonographer:     Johny Chess Referring Phys:  [id number] [place] Diagnosing  Phys: Cherlynn Kaiser MD  Sonographer Comments: Suboptimal apical window. IMPRESSIONS  1. Left ventricular ejection fraction, by visual estimation, is 60 to 65%. The left ventricle has normal  function. Mildly increased left ventricular posterior wall thickness. There is mildly increased left ventricular hypertrophy.  2. Global right ventricle has normal systolic function.The right ventricular size is normal. Right vetricular wall thickness was not assessed.  3. The mitral valve is normal in structure. No evidence of mitral valve regurgitation. No evidence of mitral stenosis.  4. The tricuspid valve is normal in structure. Tricuspid valve regurgitation is not demonstrated.  5. The aortic valve is normal in structure. Aortic valve regurgitation is not visualized. No evidence of aortic valve sclerosis or stenosis.  6. The inferior vena cava is normal in size with greater than 50% respiratory variability, suggesting right atrial pressure of 3 mmHg.  7. No evidence of valvular vegetations on this transthoracic echocardiogram, however acoustic windows are suboptimal. FINDINGS  Left Ventricle: Left ventricular ejection fraction, by visual estimation, is 60 to 65%. The left ventricle has normal function. The left ventricle is not well visualized. Mildly increased left ventricular posterior wall thickness. There is mildly increased left ventricular hypertrophy. Left ventricular diastolic parameters were normal. Normal left atrial pressure. Right Ventricle: The right ventricular size is normal. Right vetricular wall thickness was not assessed. Global RV systolic function is has normal systolic function. Left Atrium: Left atrial size was normal in size. Right Atrium: Right atrial size was normal in size Pericardium: There is no evidence of pericardial effusion. Mitral Valve: The mitral valve is normal in structure. No evidence of mitral valve regurgitation. No evidence of mitral valve stenosis by observation. Tricuspid Valve: The tricuspid valve is normal in structure. Tricuspid valve regurgitation is not demonstrated. Aortic Valve: The aortic valve is normal in structure. Aortic valve regurgitation is not  visualized. The aortic valve is structurally normal, with no evidence of sclerosis or stenosis. Pulmonic Valve: The pulmonic valve was normal in structure. Pulmonic valve regurgitation is not visualized. Pulmonic regurgitation is not visualized. Aorta: The aortic root is normal in size and structure. Venous: The inferior vena cava is normal in size with greater than 50% respiratory variability, suggesting right atrial pressure of 3 mmHg. IAS/Shunts: The interatrial septum was not well visualized. Additional Comments: No evidence of valvular vegetations on this transthoracic echocardiogram. Would recommend a transesophageal echocardiogram to exclude infective endocarditis if clinically indicated.  LEFT VENTRICLE PLAX 2D LVIDd:         5.00 cm  Diastology LVIDs:         3.10 cm  LV e' lateral: 14.10 cm/s LV PW:         1.10 cm  LV e' medial:  12.30 cm/s LV IVS:        0.80 cm LVOT diam:     2.40 cm LV SV:         80 ml LV SV Index:   35.02 LVOT Area:     4.52 cm  RIGHT VENTRICLE RV S prime:     27.10 cm/s TAPSE (M-mode): 2.8 cm LEFT ATRIUM           Index LA diam:      3.40 cm 1.50 cm/m LA Vol (A4C): 38.8 ml 17.08 ml/m  AORTIC VALVE LVOT Vmax:   105.00 cm/s LVOT Vmean:  74.200 cm/s LVOT VTI:    0.167 m  AORTA Ao Root diam: 3.60 cm  SHUNTS Systemic VTI:  0.17 m Systemic Diam: 2.40 cm  Cherlynn Kaiser MD Electronically signed by Cherlynn Kaiser MD Signature Date/Time: 04/19/2019/7:59:29 PM    Final (Updated)    IR TRANSCATH PLC STENT 1ST ART NOT LE CV CAR VERT CAR  Result Date: 04/16/2019 INDICATION: History of newly diagnosed recurrent testicular cancer, now with bulky 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 Patient was initiated bilateral pelvic/IVC catheter directed thrombolysis day prior (04/15/2019), and returns today following 24 hours of tPA infusion. EXAM: 1. IR THROMB F/U EVAL ART/VEN FINAL DAY 2. FLUOROSCOPIC GUIDED  ANGIOPLASTY AND STENT PLACEMENT OF THE IVC COMPARISON:  Initiation of bilateral catheter directed pelvic venous and IVC thrombolysis - 04/15/2019; CT abdomen pelvis - 04/11/2019 MEDICATIONS: Heparin 7000 units CONTRAST:  85 cc Omnipaque 300 ANESTHESIA/SEDATION: Moderate (conscious) sedation was employed during this procedure. A total of Versed 2 mg and Dilaudid 3 mg was administered intravenously. Moderate Sedation Time: 128 minutes. The patient's level of consciousness and vital signs were monitored continuously by radiology nursing throughout the procedure under my direct supervision. FLUOROSCOPY TIME:  14 minutes, 12 seconds (505.6 mGy) COMPLICATIONS: None immediate. TECHNIQUE: Informed written consent was obtained from the patient after a discussion of the risks, benefits and alternatives to treatment. Questions regarding the procedure were encouraged and answered. A timeout was performed prior to the initiation of the procedure. The patient was placed prone on the fluoroscopy table and the skin posterior to the bilateral knees as well as the external portion the existing bilateral vascular sheaths and infusion catheters was prepped and draped in usual sterile fashion. The surrounding subcutaneous tissues about both popliteal approach vascular sheath was anesthetized with 1% lidocaine with epinephrine. Next, post lysis venograms were performed from the bilateral infusion catheters following removal of the bilateral infusion wires. Images were reviewed and the decision was made to proceed with additional mechanical thrombectomy. With the use of a vertebral catheter exchange length stiff Glidewires were advanced from both existing popliteal vascular sheaths superior to the nearly obstructing caval lesion. Contrast injection confirmed appropriate positioning. Stiff glide wires were advanced into the left subclavian vein. 4000 units of heparin was administered intravenously. Next, beginning with the left lower  extremity, the existing 8 French vascular sheath was exchanged over the glidewire for a 13 [place] vascular sheath. The 16 mm diameter Clottreiever Inari mechanical thrombectomy device was then utilized to perform 3 passes from the left popliteal approach from at and just below level of the obstructing caval lesion. Post thrombectomy images were obtained from the left lower extremity. Next, the existing right lower extremity 6 French popliteal vein vascular sheath was exchanged over a guidewire for a 13 French vascular sheath. Again, from the right lower extremity approach, the 16 mm diameter Clottreiever Inari mechanical thrombectomy device was then utilized to perform 3 passes from the level at and just below the obstructing caval lesion. Post thrombectomy images were obtained from the right lower extremity. An ACT level was obtained and an additional 3000 units of heparin was administered intravenously. The long segment eccentric irregular narrowing/subtotal occlusion of the IVC was subsequently balloon angioplastied at multiple stations with a 14 mm x 4 cm Atlas balloon. Despite adequate balloon apposition, post angioplasty venogram images demonstrate a recurrent hemodynamically significant narrowing. As such, a 20 mm x 60 mm Venovo venous stent was deployed across the superior and mid aspect of the caval lesion and was subsequently balloon angioplastied at multiple stations initially with a 16 mm x 4 cm atlas balloon and ultimately  with an 18 mm x 4 cm balloon. Post stent deployment venogram images were obtained however there is felt to be incomplete coverage of the distal aspect of the malignant narrowing. As such, an additional 20 mm x 40 mm Venovo venous stent was deployed overlapping the mid/caudal aspect of venous stent and adequately covering the caudal aspect caval lesion. Both overlapping venous stents were subsequently balloon angioplastied at multiple stations with the 18 mm atlas balloon and  completion venogram images were obtained from both lower extremities. Images were reviewed and the procedure was terminated. All wires, catheters and sheaths were removed from the bilateral popliteal vein accesses and hemostasis was achieved with manual compression. Dressings applied. The patient tolerated the procedure well without immediate postprocedural complication. FINDINGS: Initial post lysis venogram performed from the left lower extremity infusion catheter demonstrates restoration of flow through the left pelvis with moderate amount residual thrombus primarily within the peripheral aspect of the left external iliac vein as well as the left common and central aspect of the left superficial femoral veins. There is passage of contrast beyond the nearly obstructing lesion involving mid/caudal aspect of the IVC. Initial post lysis venogram performed from the right lower extremity demonstrates persistent occlusion at the level of the right common iliac vein. Following 3 passes from each lower extremity with the 16 mm diameter Clottreiever Inari mechanical thrombectomy device, nearly all residual clot burden within the pelvis was removed. 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 This lesion was again noted to be significantly caudal to the confluence of the bilateral renal veins. As such, nearly obstructing caval lesion was treated with overlapping stent deployment of 20 mm diameter Venovo venous stents which were subsequently balloon angioplastied to 18 mm diameter. Completion images demonstrate a technically excellent result with restored brisk flow through the bilateral pelvic systems and the IVC. There is a mild residual narrowing at the level of the mid/caudal aspect  of the IVC, though not felt to result in a residual hemodynamically significant stenosis. No evidence of complication. Specifically, no evidence of contrast extravasation or vessel dissection. 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 PLAN: - Maintain IV heparin drip until patient is successfully converted to long-term anticoagulant. - Recommend maintaining SCD devices while in bed. Electronically Signed   By: Sandi Mariscal M.D.   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 EXAM: MRI ABDOMEN WITHOUT AND WITH CONTRAST TECHNIQUE: Multiplanar multisequence MR imaging of the abdomen was performed both before and after the administration of intravenous contrast. CONTRAST:  63m GADAVIST GADOBUTROL 1 MMOL/ML IV SOLN COMPARISON:  CT 04/11/2019 FINDINGS: Vascular: Arterial: Unremarkable course caliber and contour of the visualized abdominal aorta. Unremarkable course caliber and contour of the bilateral iliac arteries and proximal femoral arteries. Bilateral hypogastric arteries are patent. Venous: Right iliac/femoral system: Flow signal is maintained within the visualized proximal femoral veins. Flow signal maintained into the external iliac vein. Absent flow signal with evidence of thrombus involving the proximal right common iliac vein to the confluence and into the IVC. Expansion  of the right common iliac vein. Multiple pelvic collateral vessels. Left iliac/femoral system: Absent flow signal through the length of the left iliac venous system extending through the femoral vein. Expansion of the left iliac and femoral venous  system. Multiple pelvic collateral veins. IVC: The IVC is not well evaluated at the level of the adenopathy. Absent flow signal above the confluence of the iliac veins compatible with ilio caval thrombus. The juxtarenal IVC is patent with maintained flow signal and maintain flow signal of the bilateral renal veins. Nonvascular: Unremarkable musculoskeletal structures. Partially distended urinary bladder. Unremarkable appearance of the visualized kidneys and GI system. Edema of the lower abdominal wall/pelvic body wall, as well as edema of the left proximal lower extremity. 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 Pathologic lymphadenopathy of the retroperitoneum, similar to comparison CT, contributing to external compression of the infrarenal IVC. The current MR is nondiagnostic to confirm or rule out tumor thrombus. Lower abdominal/pelvic wall edema including edema of the proximal left lower extremity. Signed, Dulcy Fanny. Dellia Nims, RPVI [facility] Electronically Signed   By: [name] D.O.   On: 04/13/2019 14:14   VAS [place] LOWER EXTREMITY VENOUS (DVT)  Result Date: 04/11/2019  Lower Venous Study Indications: Swelling.  Risk Factors: None identified. Anticoagulation: Heparin. Limitations: Poor ultrasound/tissue interface and bowel gas. Comparison Study: No prior studies. Performing Technologist: Oliver Hum RVT  Examination Guidelines: A complete evaluation includes B-mode imaging, spectral Doppler, color Doppler, and power Doppler as needed of all accessible portions of each vessel. Bilateral testing is considered an integral part of a complete examination. Limited examinations for reoccurring indications may be performed as noted.  +---------+---------------+---------+-----------+----------+--------------+ RIGHT     CompressibilityPhasicitySpontaneityPropertiesThrombus Aging +---------+---------------+---------+-----------+----------+--------------+ CFV      Full           Yes      Yes                                 +---------+---------------+---------+-----------+----------+--------------+ SFJ      Full                                                        +---------+---------------+---------+-----------+----------+--------------+ FV Prox  Full                                                        +---------+---------------+---------+-----------+----------+--------------+ FV Mid   Full                                                        +---------+---------------+---------+-----------+----------+--------------+ FV DistalFull                                                        +---------+---------------+---------+-----------+----------+--------------+  PFV      Full                                                        +---------+---------------+---------+-----------+----------+--------------+ POP      Full           Yes      Yes                                 +---------+---------------+---------+-----------+----------+--------------+ PTV      Full                                                        +---------+---------------+---------+-----------+----------+--------------+ PERO     Full                                                        +---------+---------------+---------+-----------+----------+--------------+   +---------+---------------+---------+-----------+----------+--------------+ LEFT     CompressibilityPhasicitySpontaneityPropertiesThrombus Aging +---------+---------------+---------+-----------+----------+--------------+ CFV      None           No       No                   Acute          +---------+---------------+---------+-----------+----------+--------------+ SFJ      None                                          Acute          +---------+---------------+---------+-----------+----------+--------------+ FV Prox  None           No       No                   Acute          +---------+---------------+---------+-----------+----------+--------------+ FV Mid   None           No       No                   Acute          +---------+---------------+---------+-----------+----------+--------------+ FV DistalNone           No       No                   Acute          +---------+---------------+---------+-----------+----------+--------------+ POP      Partial        No       No                   Acute          +---------+---------------+---------+-----------+----------+--------------+ PTV      Full                                                        +---------+---------------+---------+-----------+----------+--------------+  PERO     None                                         Acute          +---------+---------------+---------+-----------+----------+--------------+ EIV      None           No       No                   Acute          +---------+---------------+---------+-----------+----------+--------------+ Unable to visualize CIV, or IVC due to bowel gas.    Summary: Right: There is no evidence of deep vein thrombosis in the lower extremity. No cystic structure found in the popliteal fossa. Left: Findings consistent with acute deep vein thrombosis involving the left external iliac vein, common femoral vein, left femoral vein, left popliteal vein, and left peroneal veins. No cystic structure found in the popliteal fossa. Unable to visualize CIV, or IVC due to bowel gas.  *See table(s) above for measurements and observations. Electronically signed by Deitra Mayo MD on 04/11/2019 at 4:09:39 PM.    Final    [place] EKG SITE RITE  Result Date: 04/24/2019 If Site Rite image not attached, placement could not be confirmed due to current cardiac rhythm.  [place] EKG SITE RITE  Result  Date: 04/21/2019 If Site Rite image not attached, placement could not be confirmed due to current cardiac rhythm.  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 Patient has experienced minimal improvement since initiation of IV heparin, and as such request made for initiation of catheter directed thrombolysis for symptomatic purposes. EXAM: 1. ULTRASOUND GUIDANCE FOR VASCULAR ACCESS X2 2. FLUOROSCOPIC GUIDED PHARMACOLOGICAL AND MECHANICAL THROMBECTOMY WITH ANGIOJET DEVICE 3. BILATERAL LOWER EXTREMITY VENOGRAM AND PLACEMENT OF INFUSION CATHETERS COMPARISON:  CT chest, abdomen pelvis-04/11/2019; abdominal MRV - 04/13/2019 MEDICATIONS: Hydralazine 10 mg IV, administered near the completion of the procedure secondary to persistently elevated blood pressure. CONTRAST:  60 cc Omnipaque 300 ANESTHESIA/SEDATION: Moderate (conscious) sedation was employed during this procedure. A total of Versed 3 mg and Fentanyl 125 mcg was administered intravenously. Moderate Sedation Time: 67 minutes. The patient's level of consciousness and vital signs were monitored continuously by radiology nursing throughout the procedure under my direct supervision. FLUOROSCOPY TIME:  18 minutes, 18 seconds (341 mGy) COMPLICATIONS: None immediate. TECHNIQUE: Informed written consent was obtained from the patient after a discussion of the risks (including but not limited to bleeding, including nontarget bleeding given history of malignancy, infection, pulmonary embolism and necessity for venous angioplasty and/or stent placement), benefits and alternatives to treatment. Questions regarding the procedure were encouraged and answered. A timeout was performed prior to the initiation of the procedure. The patient was placed  prone on the fluoroscopy table and the skin posterior to the bilateral knees was prepped and draped in the usual sterile fashion, and a sterile drape was applied covering the operative field. Maximum barrier sterile technique with sterile gowns and gloves were used for the procedure. Under direct ultrasound guidance, the left popliteal vein was access with a micropuncture kit after the overlying soft tissues were anesthetized with 1% lidocaine. An ultrasound image was saved for documentation purposed. This allowed for  placement of a 8-French vascular sheath. A limited venogram was performed through the side arm of the vascular sheath. With the use of a regular glidewire, a Kumpe catheter was advanced through the left femoral, external iliac vein and through the left common iliac vein to the level of the IVC. Limited vena cavogram demonstrates complete occlusion of the distal aspect of the IVC extending to involve both left common iliac veins. Ultimately, a moderate to long segment apparently extrinsic malignant narrowing involving the mid/distal aspect of the IVC was successfully traversed with inferior venogram demonstrating patency of the more cranial infrarenal IVC. Next, with the use of a Angiojet device, approximately 250 cc of a solution of 10 mg of tPA mixed in 500 mg of saline was infused throughout the occluded segment of the IVC, left common and external iliac veins to the level of the left common femoral vein. Attention was now paid towards access of the right lower extremity popliteal vein. After the overlying soft tissues were anesthetized with 1% lidocaine, the right popliteal vein was accessed micropuncture kit. Limited venogram was performed through the outer micro puncture sheath. This allowed for placement of a 6 French, 35 cm vascular sheath. Again, with the use of a regular glidewire, a Kumpe catheter was advanced beyond the obstructing caval lesion. Contrast injection confirmed appropriate  positioning. Next, after approximately 20 minute tPA dwell, mechanical thrombectomy was performed with the Angiojet device both from the left lower extremity access as well as the right lower extremity access. Limited vena cavagram was performed via the right lower extremity access. Kumpe catheter was utilized for measurement purposes and ultimately a 90 cm/20-cm infusion catheter was placed from the right lower extremity access and a 90 cm/30 cm infusion catheter was placed from the left lower extremity access adequately covering the occluded segment of the bilateral venous systems as well as the caudal aspect of the IVC. Postprocedural spot fluoroscopic images were obtained. Both popliteal vascular sheath were secured in place. Dressings were applied. Patient's was subjectively cold by the end of the procedure with associated elevation in his blood pressure and as such he was administered 10 mg of hydralazine and bear hugger warming device was applied. The patient otherwise tolerated the procedure well without immediate postprocedural complication. FINDINGS: Sonographic evaluation demonstrates patency of the bilateral popliteal veins. Contrast injection demonstrates patency of the bilateral popliteal and superficial femoral veins. There is occlusive thrombus extending from the left common femoral vein and the right common iliac vein through the mid/caudal aspect of the IVC secondary to a moderate to long segment length presumably extrinsic malignant occlusion secondary to known adjacent bulky retroperitoneal lymphadenopathy. Following pharmacological and mechanical thrombectomy as detailed above, there is restored flow as demonstrated on limited right central pelvic venogram with large amount of residual thrombus. Following infusion catheter placement, both side ports traverse both occluded segments of the IVC and pelvic venous systems. IMPRESSION: 1. 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 2. Successful initiation of bilateral mechanical and pharmacologic thrombectomy with bilateral infusion catheters appropriately positioned. PLAN: The patient will be transferred to ICU for overnight lytic infusion and will return tomorrow for repeat venogram and potential intervention. Electronically Signed   By: Sandi Mariscal M.D.   On: 04/15/2019 17:04   IR THROMB F/U EVAL ART/VEN FINAL DAY (MS)  Result Date: 04/16/2019 INDICATION: History of newly diagnosed recurrent testicular cancer, now with  bulky 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 Patient was initiated bilateral pelvic/IVC catheter directed thrombolysis day prior (04/15/2019), and returns today following 24 hours of tPA infusion. EXAM: 1. IR THROMB F/U EVAL ART/VEN FINAL DAY 2. FLUOROSCOPIC GUIDED ANGIOPLASTY AND STENT PLACEMENT OF THE IVC COMPARISON:  Initiation of bilateral catheter directed pelvic venous and IVC thrombolysis - 04/15/2019; CT abdomen pelvis - 04/11/2019 MEDICATIONS: Heparin 7000 units CONTRAST:  85 cc Omnipaque 300 ANESTHESIA/SEDATION: Moderate (conscious) sedation was employed during this procedure. A total of Versed 2 mg and Dilaudid 3 mg was administered intravenously. Moderate Sedation Time: 128 minutes. The patient's level of consciousness and vital signs were monitored continuously by radiology nursing throughout the procedure under my direct supervision. FLUOROSCOPY TIME:  14 minutes, 12 seconds (650.3 mGy) COMPLICATIONS: None immediate. TECHNIQUE: Informed written consent was obtained from the patient after a discussion of the risks, benefits and alternatives to treatment. Questions regarding the procedure were encouraged and answered. A timeout was performed prior to the initiation of the procedure. The patient was placed prone on the  fluoroscopy table and the skin posterior to the bilateral knees as well as the external portion the existing bilateral vascular sheaths and infusion catheters was prepped and draped in usual sterile fashion. The surrounding subcutaneous tissues about both popliteal approach vascular sheath was anesthetized with 1% lidocaine with epinephrine. Next, post lysis venograms were performed from the bilateral infusion catheters following removal of the bilateral infusion wires. Images were reviewed and the decision was made to proceed with additional mechanical thrombectomy. With the use of a vertebral catheter exchange length stiff Glidewires were advanced from both existing popliteal vascular sheaths superior to the nearly obstructing caval lesion. Contrast injection confirmed appropriate positioning. Stiff glide wires were advanced into the left subclavian vein. 4000 units of heparin was administered intravenously. Next, beginning with the left lower extremity, the existing 8 French vascular sheath was exchanged over the glidewire for a 13 [place] vascular sheath. The 16 mm diameter Clottreiever Inari mechanical thrombectomy device was then utilized to perform 3 passes from the left popliteal approach from at and just below level of the obstructing caval lesion. Post thrombectomy images were obtained from the left lower extremity. Next, the existing right lower extremity 6 French popliteal vein vascular sheath was exchanged over a guidewire for a 13 French vascular sheath. Again, from the right lower extremity approach, the 16 mm diameter Clottreiever Inari mechanical thrombectomy device was then utilized to perform 3 passes from the level at and just below the obstructing caval lesion. Post thrombectomy images were obtained from the right lower extremity. An ACT level was obtained and an additional 3000 units of heparin was administered intravenously. The long segment eccentric irregular narrowing/subtotal occlusion of  the IVC was subsequently balloon angioplastied at multiple stations with a 14 mm x 4 cm Atlas balloon. Despite adequate balloon apposition, post angioplasty venogram images demonstrate a recurrent hemodynamically significant narrowing. As such, a 20 mm x 60 mm Venovo venous stent was deployed across the superior and mid aspect of the caval lesion and was subsequently balloon angioplastied at multiple stations initially with a 16 mm x 4 cm atlas balloon and ultimately with an 18 mm x 4 cm balloon. Post stent deployment venogram images were obtained however there is felt to be incomplete coverage of the distal aspect of the malignant narrowing. As such, an additional 20 mm x 40 mm Venovo venous stent was deployed overlapping the  mid/caudal aspect of venous stent and adequately covering the caudal aspect caval lesion. Both overlapping venous stents were subsequently balloon angioplastied at multiple stations with the 18 mm atlas balloon and completion venogram images were obtained from both lower extremities. Images were reviewed and the procedure was terminated. All wires, catheters and sheaths were removed from the bilateral popliteal vein accesses and hemostasis was achieved with manual compression. Dressings applied. The patient tolerated the procedure well without immediate postprocedural complication. FINDINGS: Initial post lysis venogram performed from the left lower extremity infusion catheter demonstrates restoration of flow through the left pelvis with moderate amount residual thrombus primarily within the peripheral aspect of the left external iliac vein as well as the left common and central aspect of the left superficial femoral veins. There is passage of contrast beyond the nearly obstructing lesion involving mid/caudal aspect of the IVC. Initial post lysis venogram performed from the right lower extremity demonstrates persistent occlusion at the level of the right common iliac vein. Following 3 passes  from each lower extremity with the 16 mm diameter Clottreiever Inari mechanical thrombectomy device, nearly all residual clot burden within the pelvis was removed. 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 This lesion was again noted to be significantly caudal to the confluence of the bilateral renal veins. As such, nearly obstructing caval lesion was treated with overlapping stent deployment of 20 mm diameter Venovo venous stents which were subsequently balloon angioplastied to 18 mm diameter. Completion images demonstrate a technically excellent result with restored brisk flow through the bilateral pelvic systems and the IVC. There is a mild residual narrowing at the level of the mid/caudal aspect of the IVC, though not felt to result in a residual hemodynamically significant stenosis. No evidence of complication. Specifically, no evidence of contrast extravasation or vessel dissection. 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 PLAN: - Maintain IV heparin drip until patient is successfully converted to long-term anticoagulant. - Recommend maintaining SCD devices while in bed. Electronically Signed   By: Sandi Mariscal M.D.   On: 04/16/2019 16:26    ASSESSMENT AND PLAN:  1.  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  extensive Left lower extremity DVT, s/p thrombolysis and IVC stent placement 4. Fever with unknown origin, probably tumor fever, resolved now  5. Ilius, improving  6. Worsening anemia, s/p one unit blood 2/7 7.deconditioning, continue PT  8.   History of stage Ib seminoma testicular cancer diagnosed in 2012, status post right orchiectomy-no adjuvant chemotherapy given  Recommendations: -He is tolerating chemotherapy well, lab reviewed, potassium 3.4, median 2.6, adequate for chemotherapy, will proceed day 3 cisplatin and etoposide.  We will add 57mq potassium in his 1 L pre and post hydration -will d/c foley as he requested  -His blood culture has been negative, last one from 2/6, I think his fever is related to his recurrent cancer.  No clinical signs of infection.  I think if we can stop his antibiotics cefepime. -Continue Lovenox injection twice daily, is not comfortable to do self injection, will ask his fiance to do it at home, she is coming over on Friday or Saturday  -I encourage him to continue physical therapy, he is very motivated -Advance diet per primary  team -plan discussed with his nurse and pharmacy    Truitt Merle  04/26/2019 .

## 2019-04-27 LAB — CULTURE, BLOOD (ROUTINE X 2)
Culture: NO GROWTH
Culture: NO GROWTH
Special Requests: ADEQUATE
Special Requests: ADEQUATE

## 2019-04-27 LAB — CBC WITH DIFFERENTIAL/PLATELET
Abs Immature Granulocytes: 0.16 10*3/uL — ABNORMAL HIGH (ref 0.00–0.07)
Basophils Absolute: 0 10*3/uL (ref 0.0–0.1)
Basophils Relative: 0 %
Eosinophils Absolute: 0 10*3/uL (ref 0.0–0.5)
Eosinophils Relative: 0 %
HCT: 25.3 % — ABNORMAL LOW (ref 39.0–52.0)
Hemoglobin: 8.1 g/dL — ABNORMAL LOW (ref 13.0–17.0)
Immature Granulocytes: 1 %
Lymphocytes Relative: 2 %
Lymphs Abs: 0.3 10*3/uL — ABNORMAL LOW (ref 0.7–4.0)
MCH: 29.6 pg (ref 26.0–34.0)
MCHC: 32 g/dL (ref 30.0–36.0)
MCV: 92.3 fL (ref 80.0–100.0)
Monocytes Absolute: 0.1 10*3/uL (ref 0.1–1.0)
Monocytes Relative: 1 %
Neutro Abs: 12.7 10*3/uL — ABNORMAL HIGH (ref 1.7–7.7)
Neutrophils Relative %: 96 %
Platelets: 534 10*3/uL — ABNORMAL HIGH (ref 150–400)
RBC: 2.74 MIL/uL — ABNORMAL LOW (ref 4.22–5.81)
RDW: 15.2 % (ref 11.5–15.5)
WBC: 13.3 10*3/uL — ABNORMAL HIGH (ref 4.0–10.5)
nRBC: 0.2 % (ref 0.0–0.2)

## 2019-04-27 LAB — COMPREHENSIVE METABOLIC PANEL
ALT: 22 U/L (ref 0–44)
AST: 22 U/L (ref 15–41)
Albumin: 2.8 g/dL — ABNORMAL LOW (ref 3.5–5.0)
Alkaline Phosphatase: 69 U/L (ref 38–126)
Anion gap: 8 (ref 5–15)
BUN: 24 mg/dL — ABNORMAL HIGH (ref 6–20)
CO2: 24 mmol/L (ref 22–32)
Calcium: 7.4 mg/dL — ABNORMAL LOW (ref 8.9–10.3)
Chloride: 107 mmol/L (ref 98–111)
Creatinine, Ser: 0.85 mg/dL (ref 0.61–1.24)
GFR calc Af Amer: >60 mL/min (ref >60)
GFR calc non Af Amer: >60 mL/min (ref >60)
Glucose, Bld: 115 mg/dL — ABNORMAL HIGH (ref 70–99)
Potassium: 3.2 mmol/L — ABNORMAL LOW (ref 3.5–5.1)
Sodium: 139 mmol/L (ref 135–145)
Total Bilirubin: 0.6 mg/dL (ref 0.3–1.2)
Total Protein: 6.6 g/dL (ref 6.5–8.1)

## 2019-04-27 LAB — FOLATE: Folate: 4.5 ng/mL — ABNORMAL LOW (ref >5.9)

## 2019-04-27 LAB — VITAMIN B12: Vitamin B-12: 125 pg/mL — ABNORMAL LOW (ref 180–914)

## 2019-04-27 LAB — MAGNESIUM: Magnesium: 2.4 mg/dL (ref 1.7–2.4)

## 2019-04-27 MED ORDER — SODIUM CHLORIDE 0.9 % IV SOLN
100.0000 mg/m2 | Freq: Once | INTRAVENOUS | Status: AC
  Administered 2019-04-27: 14:00:00 230 mg via INTRAVENOUS
  Filled 2019-04-27: qty 11.5

## 2019-04-27 MED ORDER — POTASSIUM CHLORIDE 2 MEQ/ML IV SOLN
Freq: Once | INTRAVENOUS | Status: AC
  Filled 2019-04-27: qty 1000

## 2019-04-27 MED ORDER — SODIUM CHLORIDE 0.9 % IV SOLN
10.0000 mg | Freq: Once | INTRAVENOUS | Status: AC
  Administered 2019-04-27: 10 mg via INTRAVENOUS
  Filled 2019-04-27: qty 1

## 2019-04-27 MED ORDER — SODIUM CHLORIDE 0.9 % IV SOLN: Freq: Once | INTRAVENOUS | Status: DC

## 2019-04-27 MED ORDER — FOLIC ACID 1 MG PO TABS
1.0000 mg | ORAL_TABLET | Freq: Every day | ORAL | Status: DC
  Administered 2019-04-27 – 2019-04-29 (×3): 1 mg via ORAL
  Filled 2019-04-27 (×3): qty 1

## 2019-04-27 MED ORDER — CYANOCOBALAMIN 1000 MCG/ML IJ SOLN
1000.0000 ug | Freq: Every day | INTRAMUSCULAR | Status: DC
  Administered 2019-04-28 – 2019-04-29 (×2): 1000 ug via INTRAMUSCULAR
  Filled 2019-04-27 (×3): qty 1

## 2019-04-27 MED ORDER — SODIUM CHLORIDE 0.9 % IV SOLN
20.0000 mg/m2 | Freq: Once | INTRAVENOUS | Status: AC
  Administered 2019-04-27: 46 mg via INTRAVENOUS
  Filled 2019-04-27: qty 46

## 2019-04-27 MED ORDER — POTASSIUM CHLORIDE CRYS ER 20 MEQ PO TBCR
40.0000 meq | EXTENDED_RELEASE_TABLET | ORAL | Status: AC
  Administered 2019-04-27: 11:00:00 40 meq via ORAL
  Filled 2019-04-27: qty 2

## 2019-04-27 MED ORDER — ENSURE ENLIVE PO LIQD
237.0000 mL | Freq: Two times a day (BID) | ORAL | Status: DC
  Administered 2019-04-28 (×2): 237 mL via ORAL

## 2019-04-27 MED ORDER — POTASSIUM CHLORIDE CRYS ER 20 MEQ PO TBCR
40.0000 meq | EXTENDED_RELEASE_TABLET | Freq: Once | ORAL | Status: AC
  Administered 2019-04-27: 21:00:00 40 meq via ORAL
  Filled 2019-04-27: qty 2

## 2019-04-27 MED ORDER — ASPIRIN 81 MG PO CHEW
81.0000 mg | CHEWABLE_TABLET | Freq: Every day | ORAL | Status: DC
  Administered 2019-04-27 – 2019-04-29 (×3): 81 mg via ORAL
  Filled 2019-04-27 (×3): qty 1

## 2019-04-27 NOTE — Progress Notes (Signed)
Physical Therapy Treatment Patient Details Name: Timothy [name] MRN: [id number] DOB: 11-Apr-1980 Today's Date: 04/27/2019    History of Present Illness Pt is a 39 y.o. male admitted 04/10/19 with LLE swelling and back pain; pt lives in [place] but drove down to [place] to visit mother ~1 wk ago. Found to have extensive LLE DVT; also with significant lymphadenopathy in the retroperitoneal area concerning for metastatic disease of unknown primary. Biopsy consistent with metastatic seminoma. S/p mechanical and pharmacological thrombolysis with stenting of the IVC for resistant malignant narrowing. Pt with ilieus 2/1. 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    PT Comments    Pt feeling better this morning.  "you got me at a bad time yesterday afternoon with my treatment".  Assisted out of bathroom then amb a greater distance in hallway.  Less lean on walker and able to take a few steps without.  Pt excited his diet was upgraded.  Pt very hopeful to D/C to home this weekend  Follow Up Recommendations  No PT follow up     Equipment Recommendations  Rolling walker with 5" wheels    Recommendations for Other Services       Precautions / Restrictions Precautions Precautions: Fall Precaution Comments: CHEMO    Mobility  Bed Mobility               General bed mobility comments: OOB in recliner  Transfers Overall transfer level: Needs assistance Equipment used: Rolling walker (2 wheeled) Transfers: Sit to/from Stand Sit to Stand: Supervision;Modified independent (Device/Increase time) Stand pivot transfers: Modified independent (Device/Increase time);Supervision       General transfer comment: for safety  Ambulation/Gait Ambulation/Gait assistance: Supervision Gait Distance (Feet): 375 Feet Assistive device: Rolling walker (2 wheeled) Gait Pattern/deviations: Decreased stride length;Trunk flexed;Step-through pattern;Wide base of  support;Decreased step length - left Gait velocity: decreased   General Gait Details: tolerated an increased distance with mild pain in low back and B LE "stiffness"  "edema"   Stairs             Wheelchair Mobility    Modified Rankin (Stroke Patients Only)       Balance                                            Cognition Arousal/Alertness: Awake/alert Behavior During Therapy: WFL for tasks assessed/performed Overall Cognitive Status: Within Functional Limits for tasks assessed                                 General Comments: AxO x 4 and very intelligent, 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      Exercises      General Comments        Pertinent Vitals/Pain Pain Assessment: No/denies pain Pain Location: B LE stiff/edema    Home Living                      Prior Function            PT Goals (current goals can now be found in the care plan section) Progress towards PT goals: Progressing toward goals    Frequency    Min 3X/week      PT Plan Current plan remains appropriate  Co-evaluation              AM-PAC PT "6 Clicks" Mobility   Outcome Measure  Help needed turning from your back to your side while in a flat bed without using bedrails?: None Help needed moving from lying on your back to sitting on the side of a flat bed without using bedrails?: None Help needed moving to and from a bed to a chair (including a wheelchair)?: None Help needed standing up from a chair using your arms (e.g., wheelchair or bedside chair)?: None Help needed to walk in hospital room?: A Little Help needed climbing 3-5 steps with a railing? : A Little 6 Click Score: 22    End of Session Equipment Utilized During Treatment: Gait belt Activity Tolerance: Patient tolerated treatment well   Nurse Communication: Mobility status;Patient requests pain meds PT Visit Diagnosis: Other abnormalities of gait and  mobility (R26.89);Pain;Difficulty in walking, not elsewhere classified (R26.2)     Time: 1130-1155 PT Time Calculation (min) (ACUTE ONLY): 25 min  Charges:  $Gait Training: 8-22 mins $Therapeutic Activity: 8-22 mins                     {Kaisyn Reinhold  PTA Acute  Rehabilitation Services Pager      [phone] Office      [phone]

## 2019-04-27 NOTE — Progress Notes (Signed)
Nutrition Follow-up  DOCUMENTATION CODES:   Not applicable  INTERVENTION:  Discontinue Boost Breeze Continue Pro-stat 30 ml BID Continue MVI daily  -Ensure Enlive po BID, each supplement provides 350 kcal and 20 grams of protein (strawberry)  -Recommend obtaining new weight  NUTRITION DIAGNOSIS:   Increased nutrient needs related to cancer and cancer related treatments as evidenced by estimated needs.  Ongoing, addressing with nutrition supplements  GOAL:   Patient will meet greater than or equal to 90% of their needs  Progressing  MONITOR:   PO intake, Supplement acceptance, Weight trends, Labs, I & O's, Diet advancement  REASON FOR ASSESSMENT:   Consult Assessment of nutrition requirement/status  ASSESSMENT:   Patient with PMH significant for stage Ib seminoma testicular cancer s/p R radial orchiectomy (2012). Presents this admission with L lower extremity DVT with intra-abdominal and retroperitoneal lymphadenopathy concerning for metastatic cancer.   Patient is s/p thrombolysis and IVC stent placement on 1/31  Per chart:  2/6 CT persistent ileus 2/7 1 unit PRBC 2/8 day 1 cisplatin;etoposide 2/9 day 2 cisplatin;etoposide 2/10 day 3 cisplatin:etoposide  Per notes, pt is pending day 4 of chemotherapy this afternoon and is to receive day 5 of cycle one chemotherapy 2/12, possibly discharge home after treatment if tolerating regular diet. Oncology to arrange outpatient follow-up at [facility].  Patient sitting in bedside chair this afternoon at RD visit. Patient ordered grilled chicken salad for lunch that was delivered just prior to visit, pt stated that he was starving and ready to eat actual food. Patient endorses tolerating full liquids, having bowel movements, denies nausea/vomiting, and reports feeling good today. Patient endorses normally having a good appetite, recalls 2-3 meals/day. He reports eating more healthy during pandemic due to cooking meals at  home.   RD encouraged regular daily intake of nutrition supplement to aid with meeting estimated needs and maintaining weight during chemotherapy treatments. Patient amenable to strawberry Ensure during admission.   UBW 200 lbs per pt report On 2/2 pt wt 216.48 lbs, recommend obtaining current wt. No weight history available for review.   Medications reviewed and include: Ferrous sulfate, MVI, Protonix, Klor-con, Miralax  Labs: K 3.2 (L), Cr 24 (H)  NUTRITION - FOCUSED PHYSICAL EXAM: Unable to complete, patient eating lunch at RD visit.   Diet Order:   Diet Order            Diet regular Room service appropriate? Yes; Fluid consistency: Thin  Diet effective now              EDUCATION NEEDS:   Not appropriate for education at this time  Skin:  Skin Assessment: Skin Integrity Issues: Skin Integrity Issues:: Incisions Incisions: R/L knee  Last BM:  2/11 type 7  Height:   Ht Readings from Last 1 Encounters:  04/23/19 6\' 4"  (1.93 m)    Weight:   Wt Readings from Last 1 Encounters:  04/18/19 98.4 kg    Ideal Body Weight:  89.1 kg  BMI:  Body mass index is 26.41 kg/m.  Estimated Nutritional Needs:   Kcal:  [id number] kcal  Protein:  125-145 grams  Fluid:  >/= 2.4 L/day   Lajuan Lines, RD, LDN Clinical Nutrition Office Telephone [phone] After Hours/Weekend Pager: [phone]

## 2019-04-27 NOTE — Progress Notes (Signed)
OT Cancellation Note  Patient Details Name: Timothy [name] MRN: [id number] DOB: January 31, 1981   Cancelled Treatment:    Reason Eval/Treat Not Completed: Fatigue/lethargy limiting ability to participate  [place] 04/27/2019, 1:18 PM  Karsten Ro, OTR/L Acute Rehabilitation Services 04/27/2019

## 2019-04-27 NOTE — Progress Notes (Signed)
Per MD bump K up to 60Meq and no mag in IVF today. Keep rate of hydration at 173ml/hr with 60Meq potassium in bag. Chemo doses will remain the same.  Dr. Burr Medico will reevaluate elytes in AM

## 2019-04-27 NOTE — Progress Notes (Signed)
**Note De-Identified vi Obfusction** PROGRESS NOTE    Timothy [name]  [id number] DOB: 1980/11/17 DOA: 04/10/2019 PCP: Ptient, No Pcp Per   Brief Nrrtive:  History of testiculr crcinom stge Ib followed t [facility] hd right orchiectomy lives in [place] trvel to [place] to visit his mother dmitted to [facility] due to cute LLE DVT nd found to hve recurrent cncer. Pt ws trnsferred to [facility] for mechnicl thrombolysis of LLE DVT, hospitl course complicted by retroperitonel hemtom, psos hemtom, nemi, ileus,  AKI, new mild right hydronephrosis presumbly from externl compression,  fever of unknown origin on empiricl bx due to underline immunosuppressed sttus. Pt ws trnsferred bck to [place] per oncology request to strt chemo.  Assessment & Pln:   Principl Problem:   DVT (deep venous thrombosis) (HCC) Active Problems:   H/O testiculr cncer   Lymphdenopthy, retroperitonel   Seminom (HCC)   Acute deep vein thrombosis (DVT) of left lower extremity (HCC)   Elevted serum cretinine   Abdominl distension   Abscess fter procedure   Fever   Ileus (HCC)  Left lower extremity DVT s/p mechnicl nd phrmcologicl thrombolysis with stenting of the IVC for resistnt mlignnt nrrowing (IR Dr Erleen Newport on 1/31) Noted bulky retroperitonel lymphdenopthy nd IVC occlusionin the setting of testiculr cncer S/P heprin gtt, trnsition to [place] therpeutic lovenox Long term nticogultion pln:  - Lovenox to be continued for 30 dys, then trnsition to orl nticogultion of mnging physicin's choice - 90 dys of 81 mg ASA dily (strt spirin) - Bilterl LE compression stockings, t lest knee high  - will need copy of medicl records to fcilitte cre on dischrge Per Dr. Stephenie Acres. Lvon Pgnini, Assistnt Professor of Rdiology, hs kindly greed to see Mr Winther in continuity of his current vsculr cre, when Mr Mchnik returns home Scheduling: [place]  Office: [phone] Dr. Ottis Stin hs lso kindly rrnged follow-up with the [facility], nd Mr Cllnn should hve  pending office ppointment upon rrivl  Fever of unknown origin/leukocytosis Lst febrile spike, low grde on 04/24/2019, 100.107F, leukocytosis is persistent, improving tody BC/UC re negtive Likely 2/2 recurrent mlignncy Echo norml ejection frction, no evidence of vlvulr infection Repet chest x-ry showed fint bilterl lower lung field telectsis or developing infiltrte (likely telectsis due to significnt bdominl distention, lso hs been on cefepime for bout  week now) Will d/c bx s suspect fever is relted to mlignncy  Sinus tchycrdi Likely rective - now improved CTA chest, no PE noted, lthough motion rtifct TSH 6.484, free T4 1.09 Decision ws mde to DC telemetry for now s ptient needs to strt chemotherpy on the sixth floor, of which hs no telemetry beds Monitor closely  Hypoklemi Replce nd follow  AKI/ new onset urinry retention /mild right hydro Cr peked t 1.46, trended bck to norml Urology input pprecited - note by Dr. Louis Meckel on 2/7, no recommendtion for ny urologic intervention t this time Dily CMP  Normocytic nemi  Iron Deficiency  B12 nd Folte Deficiency Hemoglobin trending down Multifctoril, from cute illness, psos hemtom, hemturi, mlignncy S/p 1 U of PRBC Dily CBC Strt iron supplementtion B12 nd folte supplementtion  Ileus Noted bdominl distention, likely 2/2 significnt nrcotic use (djusted nrcotic dosge) Hving loose BM Repet x-ry bdomen - c/w ileus Ambulte s tolerted Will dvnce diet s tolerted  Recurrent testiculr cncer  Noted retroperitonel lymphodenopthy Oncology Dr Burr Medico on bord, strted chemo on 04/24/19  DVT prophylxis: lovenox Code Sttus: full code Fmily Communiction: **Note De-Identified vi Obfusction** none t bedside  Disposition Pln:  . Ptient cme from: home           . Anticipted d/c plce: home . Brriers to d/c OR conditions which need to be met to effect  sfe d/c: pending clernce by Oncology   Consultnts:   Oncology  IR  Urology  Criticl Cre  Procedures:   Mechnicl thrombolysis nd stenting of IVC  CT biopsy 1/27  Antimicrobils: Anti-infectives (From dmission, onwrd)   Strt     Dose/Rte Route Frequency Ordered Stop   04/19/19 1600  metroNIDAZOLE (FLAGYL) IVPB 500 mg  Sttus:  Discontinued     500 mg 100 mL/hr over 60 Minutes Intrvenous Every 8 hours 04/19/19 1554 04/26/19 1836   04/18/19 1900  vncomycin (VANCOREADY) IVPB 1250 mg/250 mL  Sttus:  Discontinued     1,250 mg 166.7 mL/hr over 90 Minutes Intrvenous Every 8 hours 04/18/19 1035 04/20/19 1305   04/18/19 1000  ceFEPIme (MAXIPIME) 1 g in sodium chloride 0.9 % 100 mL IVPB  Sttus:  Discontinued     1 g 200 mL/hr over 30 Minutes Intrvenous Every 12 hours 04/18/19 0748 04/18/19 0926   04/18/19 1000  ceFEPIme (MAXIPIME) 2 g in sodium chloride 0.9 % 100 mL IVPB  Sttus:  Discontinued     2 g 200 mL/hr over 30 Minutes Intrvenous Every 8 hours 04/18/19 0926 04/26/19 1311   04/18/19 0930  vncomycin (VANCOREADY) IVPB 2000 mg/400 mL     2,000 mg 200 mL/hr over 120 Minutes Intrvenous  Once 04/18/19 0842 04/18/19 1258     Subjective: Asking to dvnce diet No other complints Hs liquid stool on liquid diet No bd pin, but distension  Objective: Vitls:   04/26/19 1402 04/26/19 2310 04/27/19 0638 04/27/19 1430  BP: 125/76 126/77 125/89 129/88  Pulse: 85 92 79 73  Resp: '20 19 18 20  ' Temp:  98 F (36.7 C) 97.9 F (36.6 C) 98.6 F (37 C)  TempSrc:  Orl Orl   SpO2: 96% 96% 96% 97%  Weight:      Height:        Intke/Output Summry (Lst 24 hours) t 04/27/2019 1816 Lst dt filed t 04/27/2019 1500 Gross per 24 hour  Intke 240 ml  Output 3325 ml  Net -3085 ml   Filed Weights   04/10/19  2005 04/18/19 0600  Weight: 90.7 kg 98.4 kg    Exmintion:  Generl: No cute distress. Crdiovsculr: Hert sounds show  regulr rte, nd rhythm. Lungs: Cler to usculttion bilterlly  Abdomen: Soft, nontender, nondistended  Neurologicl: Alert nd oriented 3. Moves ll extremities 4 . Crnil nerves II through XII grossly intct. Skin: Wrm nd dry. No rshes or lesions. Extremities: No clubbing or cynosis. Bilterl LE edem  Dt Reviewed: I hve personlly reviewed following lbs nd imging studies  CBC: Recent Lbs  Lb 04/23/19 1406 04/24/19 0414 04/25/19 0552 04/26/19 0537 04/27/19 0629  WBC 16.5* 13.4* 11.2* 14.5* 13.3*  NEUTROABS  --   --  10.1* 13.3* 12.7*  HGB 7.0* 7.2* 7.6* 7.3* 8.1*  HCT 21.4* 22.6* 24.4* 23.1* 25.3*  MCV 90.3 91.5 92.8 92.4 92.3  PLT 446* 340 354 409* 325*   Bsic Metbolic Pnel: Recent Lbs  Lb 04/23/19 0930 04/24/19 0414 04/25/19 0552 04/26/19 0537 04/27/19 0629  NA 135 135 138 138 139  K 3.3* 2.8* 3.5 3.4* 3.2*  CL 106 105 108 108 107  CO2 19* 23 21* 23 24  GLUCOSE 113* 127* 153* 135*  115*  BUN '13 16 19 ' 24* 24*  CREATININE 1.32* 1.39* 0.83 0.81 0.85  CALCIUM 7.5* 7.6* 7.4* 7.3* 7.4*  MG  --  2.5* 2.7* 2.6* 2.4   GFR: Estimated Creatinine Clearance: 147.5 mL/min (by C-G formula based on SCr of 0.85 mg/dL). Liver Function Tests: Recent Labs  Lab 04/22/19 0500 04/23/19 0930 04/25/19 0552 04/26/19 0537 04/27/19 0629  AST 37 32 '24 22 22  ' ALT '28 27 24 22 22  ' ALKPHOS 60 67 83 70 69  BILITOT 0.7 0.8 0.9 1.0 0.6  PROT 5.9* 6.1* 6.3* 6.2* 6.6  ALBUMIN 2.2* 2.3* 2.7* 2.5* 2.8*   No results for input(s): LIPASE, AMYLASE in the last 168 hours. No results for input(s): AMMONIA in the last 168 hours. Coagulation Profile: No results for input(s): INR, PROTIME in the last 168 hours. Cardiac Enzymes: No results for input(s): CKTOTAL, CKMB, CKMBINDEX, TROPONINI in the last 168 hours. BNP (last 3 results) No results  for input(s): PROBNP in the last 8760 hours. HbA1C: No results for input(s): HGBA1C in the last 72 hours. CBG: No results for input(s): GLUCAP in the last 168 hours. Lipid Profile: No results for input(s): CHOL, HDL, LDLCALC, TRIG, CHOLHDL, LDLDIRECT in the last 72 hours. Thyroid Function Tests: No results for input(s): TSH, T4TOTAL, FREET4, T3FREE, THYROIDAB in the last 72 hours. Anemia Panel: Recent Labs    04/27/19 1045 04/27/19 1047  VITAMINB12  --  125*  FOLATE 4.5*  --    Sepsis Labs: No results for input(s): PROCALCITON, LATICACIDVEN in the last 168 hours.  Recent Results (from the past 240 hour(s))  Culture, blood (routine x 2)     Status: None   Collection Time: 04/22/19 11:58 AM   Specimen: BLOOD LEFT WRIST  Result Value Ref Range Status   Specimen Description BLOOD LEFT WRIST  Final   Special Requests AEROBIC BOTTLE ONLY Blood Culture adequate volume  Final   Culture   Final    NO GROWTH 5 DAYS Performed at [facility], [address], [place], [place] [address]    Report Status 04/27/2019 FINAL  Final  Culture, blood (routine x 2)     Status: None   Collection Time: 04/22/19 11:59 AM   Specimen: BLOOD LEFT HAND  Result Value Ref Range Status   Specimen Description BLOOD LEFT HAND  Final   Special Requests AEROBIC BOTTLE ONLY Blood Culture adequate volume  Final   Culture   Final    NO GROWTH 5 DAYS Performed at [facility], [place] [address] [place], [place] [address]    Report Status 04/27/2019 FINAL  Final         Radiology Studies: No results found.      Scheduled Meds: . Chlorhexidine Gluconate Cloth  6 each Topical Daily  . enoxaparin (LOVENOX) injection  1 mg/kg Subcutaneous Q12H  . feeding supplement (ENSURE ENLIVE)  237 mL Oral BID BM  . feeding supplement (PRO-STAT SUGAR FREE 64)  30 mL Oral BID  . ferrous sulfate  325 mg Oral Q breakfast  . multivitamin with minerals  1 tablet Oral Daily  . pantoprazole (PROTONIX) IV  40  mg Intravenous Q12H  . polyethylene glycol  17 g Oral BID  . sodium chloride flush  10-40 mL Intracatheter Q12H   Continuous Infusions: . sodium chloride    . sodium chloride       LOS: 16 days    Time spent: over 30 min    Fayrene Helper,  MD [facility]   To contact the attending provider between 7A-7P or the covering provider during after hours 7P-7A, please log into the web site [email or web address] and access using universal [place] password for that web site. If you do not have the password, please call the hospital operator.  04/27/2019, 6:16 PM

## 2019-04-27 NOTE — Progress Notes (Signed)
Lovenox injection teaching done with patient. Pt verbalized understanding but declined administering the shot. States he will inject himself at bedtime dose. Oncoming RN made aware.

## 2019-04-27 NOTE — Progress Notes (Addendum)
HEMATOLOGY-ONCOLOGY PROGRESS NOTE  SUBJECTIVE: Tolerated day 3 of chemotherapy well overall yesterday.  No recurrent hematuria.  Denies nausea or vomiting.  Bowels are moving well.  No recurrent fever or chills.  I have reviewed the past medical history, past surgical history, social history and family history with the patient and they are unchanged from previous note.   PHYSICAL EXAMINATION:  Vitals:   04/26/19 2310 04/27/19 0638  BP: 126/77 125/89  Pulse: 92 79  Resp: 19 18  Temp: 98 F (36.7 C) 97.9 F (36.6 C)  SpO2: 96% 96%   Filed Weights   04/10/19 2005 04/18/19 0600  Weight: 200 lb (90.7 kg) 216 lb 14.9 oz (98.4 kg)    Intake/Output from previous day: 02/10 0701 - 02/11 0700 In: 2049.8 [I.V.:955.9; IV Piggyback:1093.9] Out: 0093 [Urine:3175]  GENERAL:alert, no distress and comfortable LEGS: Mild bilateral lower extremity edema, more on left,  no tenderness, or skin erythema, no edema on right leg  Abdomen: Distended, soft, nontender, active bowel sounds Lung: Normal clear breath sounds on bilateral lung, no rales  LABORATORY DATA:  I have reviewed the data as listed CMP Latest Ref Rng & Units 04/27/2019 04/26/2019 04/25/2019  Glucose 70 - 99 mg/dL 115(H) 135(H) 153(H)  BUN 6 - 20 mg/dL 24(H) 24(H) 19  Creatinine 0.61 - 1.24 mg/dL 0.85 0.81 0.83  Sodium 135 - 145 mmol/L 139 138 138  Potassium 3.5 - 5.1 mmol/L 3.2(L) 3.4(L) 3.5  Chloride 98 - 111 mmol/L 107 108 108  CO2 22 - 32 mmol/L 24 23 21(L)  Calcium 8.9 - 10.3 mg/dL 7.4(L) 7.3(L) 7.4(L)  Total Protein 6.5 - 8.1 g/dL 6.6 6.2(L) 6.3(L)  Total Bilirubin 0.3 - 1.2 mg/dL 0.6 1.0 0.9  Alkaline Phos 38 - 126 U/L 69 70 83  AST 15 - 41 U/L _0 ALT 0 - 44 U/L _1 Lab Results  Component Value Date   WBC 13.3 (H) 04/27/2019   HGB 8.1 (L) 04/27/2019   HCT 25.3 (L) 04/27/2019   MCV 92.3 04/27/2019   PLT 534 (H) 04/27/2019   NEUTROABS 12.7 (H) 04/27/2019    CT ABDOMEN PELVIS WO CONTRAST  Result  Date: 04/22/2019 CLINICAL DATA:  Fever, abdominal distension, testicular cancer, status post right orchiectomy EXAM: CT ABDOMEN AND PELVIS WITHOUT CONTRAST TECHNIQUE: Multidetector CT imaging of the abdomen and pelvis was performed following the standard protocol without IV contrast. COMPARISON:  Abdominal radiographs, 04/21/2019, CT abdomen pelvis, 04/17/2018, 04/11/2019 FINDINGS: Lower chest: Unchanged small bilateral pleural effusions and associated atelectasis or consolidation. Hepatobiliary: No solid liver abnormality is seen. No gallstones, gallbladder wall thickening, or biliary dilatation. Pancreas: Unremarkable. No pancreatic ductal dilatation or surrounding inflammatory changes. Spleen: Normal in size without significant abnormality. Adrenals/Urinary Tract: Adrenal glands are unremarkable. Mild right hydronephrosis, slightly increased compared to prior examination. Foley catheter in the urinary bladder. Stomach/Bowel: Stomach is within normal limits. The colon remains diffusely gas and fluid-filled to the rectum, caliber of the colon slightly increased, measuring up to 9 cm in the transverse colon. Sigmoid diverticulosis. Vascular/Lymphatic: Stent of the infrarenal IVC. Unchanged bulky retroperitoneal lymphadenopathy, largest left retroperitoneal nodes measuring up to 3.5 x 2.8 cm (series 3, image 60). Reproductive: No mass or other significant abnormality. Other: Anasarca. Retroperitoneal hematomas in the left psoas (series 3, image 57) and in the retroperitoneal fat posterior to the right psoas (series 3, image 66), not significantly changed. Musculoskeletal: No acute or significant osseous findings. IMPRESSION: 1. The colon remains diffusely  gas and fluid-filled to the rectum, slightly increased in caliber compared to prior examination. Findings favor ileus. No specific findings to suggest infectious or inflammatory colitis. 2. Retroperitoneal hematomas in the left psoas and retroperitoneal fat posterior  to the right psoas are not significantly changed. 3. Unchanged bulky retroperitoneal lymphadenopathy. 4. Mild right hydronephrosis, slightly increased compared to prior examination, the mid to distal ureter presumably obstructed by adjacent lymph nodes. 5. Pleural effusions and anasarca. 6. Status post right orchiectomy. Electronically Signed   By: Eddie Candle M.D.   On: 04/22/2019 13:21   DG Abd 1 View  Result Date: 04/21/2019 CLINICAL DATA:  Ileus, history of IVC stent placement EXAM: ABDOMEN - 1 VIEW COMPARISON:  04/20/2019 FINDINGS: Supine frontal view of the abdomen and pelvis excludes the left flank and lower pelvis by collimation. There is progressive gaseous distension of large and small bowel. IVC stent unchanged. No masses or abnormal calcifications. IMPRESSION: Progressive gaseous distension of large and small bowel consistent with ileus. Electronically Signed   By: Randa Ngo M.D.   On: 04/21/2019 08:42   DG Abd 1 View  Result Date: 04/20/2019 CLINICAL DATA:  Diffuse abdominal tenderness, abdominal distension EXAM: ABDOMEN - 1 VIEW COMPARISON:  04/19/2019 FINDINGS: 2 supine frontal views of the abdomen and pelvis demonstrate persistent diffuse gaseous distention of the colon likely postoperative ileus. No masses or abnormal calcifications. Venous stent again noted. IMPRESSION: 1. Continued postoperative ileus. Electronically Signed   By: Randa Ngo M.D.   On: 04/20/2019 15:10   DG Abd 1 View  Result Date: 04/19/2019 CLINICAL DATA:  Evaluate ileus. EXAM: ABDOMEN - 1 VIEW COMPARISON:  April 19, 2019 (acquired at 8:04 a.m. FINDINGS: Numerous dilated air-filled loops of large bowel are again seen. This is unchanged in appearance when compared to the prior study. The visualized small bowel loops are normal in caliber. A radiopaque stent is seen overlying the medial aspect of the mid right abdomen. No radio-opaque calculi or other significant radiographic abnormality are seen. IMPRESSION:  1. Stable findings likely consistent with a postoperative ileus, unchanged in appearance when compared to the prior study dated April 19, 2019 (acquired at 8:04 a.m.). Electronically Signed   By: Virgina Norfolk M.D.   On: 04/19/2019 20:28   DG Abd 1 View  Result Date: 04/19/2019 CLINICAL DATA:  Generalized abdominal pain, postoperative ileus. EXAM: ABDOMEN - 1 VIEW COMPARISON:  April 18, 2019. FINDINGS: Stable dilated air-filled colon is noted most consistent with postoperative ileus. No significant small bowel dilatation is noted. Phleboliths are noted in the pelvis. IVC stent is again noted. IMPRESSION: Stable dilated air-filled colon is noted most consistent with postoperative ileus. Electronically Signed   By: Marijo Conception M.D.   On: 04/19/2019 09:14   CT ANGIO CHEST PE W OR WO CONTRAST  Result Date: 04/19/2019 CLINICAL DATA:  Hypoxia. EXAM: CT ANGIOGRAPHY CHEST WITH CONTRAST TECHNIQUE: Multidetector CT imaging of the chest was performed using the standard protocol during bolus administration of intravenous contrast. Multiplanar CT image reconstructions and MIPs were obtained to evaluate the vascular anatomy. CONTRAST:  152m OMNIPAQUE IOHEXOL 300 MG/ML SOLN, 843mOMNIPAQUE IOHEXOL 350 MG/ML SOLN COMPARISON:  04/11/2019 FINDINGS: Cardiovascular: Evaluation for acute pulmonary emboli is severely limited by respiratory motion artifact and suboptimal contrast bolus timing. Given these limitations, no large centrally located pulmonary embolism was detected.Detection of smaller pulmonary emboli is not possible on this exam. The main pulmonary artery is within normal limits for size. There is no CT evidence of  acute right heart strain. The visualized aorta is normal. Heart size is normal, without pericardial effusion. Mediastinum/Nodes: --No mediastinal or hilar lymphadenopathy. --No axillary lymphadenopathy. --there is an enlarged left supraclavicular lymph node measuring 1.4 cm (axial series 5, image  5). --Normal thyroid gland. --The esophagus is unremarkable Lungs/Pleura: There is atelectasis at the lung bases bilaterally. There are new small bilateral pleural effusions, left greater than right. Upper Abdomen: No acute abnormality. Musculoskeletal: No chest wall abnormality. No acute or significant osseous findings. Review of the MIP images confirms the above findings. IMPRESSION: 1. Evaluation for acute pulmonary emboli is severely limited by respiratory motion artifact and suboptimal contrast bolus timing. Given these limitations, no large centrally located pulmonary embolism was detected. 2. New small bilateral pleural effusions, left greater than right. 3. Enlarged left supraclavicular lymph node measuring 1.4 cm. This may be malignant or reactive in etiology. It is amenable to percutaneous biopsy as clinically indicated. Electronically Signed   By: Constance Holster M.D.   On: 04/19/2019 23:17   CT Angio Chest PE W and/or Wo Contrast  Result Date: 04/11/2019 CLINICAL DATA:  Positive lower extremity DVT, PE suspected. Back pain and leg swelling, history of testicular cancer. EXAM: CT ANGIOGRAPHY CHEST CT ABDOMEN AND PELVIS WITH CONTRAST TECHNIQUE: Multidetector CT imaging of the chest was performed using the standard protocol during bolus administration of intravenous contrast. Multiplanar CT image reconstructions and MIPs were obtained to evaluate the vascular anatomy. Multidetector CT imaging of the abdomen and pelvis was performed using the standard protocol during bolus administration of intravenous contrast. CONTRAST:  151m OMNIPAQUE IOHEXOL 350 MG/ML SOLN COMPARISON:  None. FINDINGS: CTA CHEST FINDINGS Cardiovascular: Suboptimal opacification of the pulmonary arteries limits evaluation beyond the lobar level. No large central or lobar pulmonary arterial filling defects are identified. Central pulmonary arteries are normal caliber. Normal cardiac size. Slight mass effect on the right heart by a  mild pectus deformity of the chest wall with a Haller index of 3.2. The aorta is normal caliber. Shared origin of the brachiocephalic and left common carotid arteries. Great vessels are otherwise unremarkable. Mediastinum/Nodes: No enlarged mediastinal, hilar or axillary adenopathy is seen. No acute abnormality of the trachea or esophagus. Thyroid gland is normal. Asymmetric thickening of the left sternocleidomastoid is likely related to muscle activation. Lungs/Pleura: Respiratory motion artifact is mild. No consolidation, features of edema, pneumothorax, or effusion. No suspicious pulmonary nodules or masses. Musculoskeletal: No acute osseous abnormality or suspicious osseous lesion in the imaged chest. No worrisome chest wall lesions. Mild bilateral gynecomastia is noted, right slightly greater left. Review of the MIP images confirms the above findings. CT ABDOMEN and PELVIS FINDINGS Hepatobiliary: No focal liver abnormality is seen. No gallstones, gallbladder wall thickening, or biliary dilatation. Pancreas: Unremarkable. No pancreatic ductal dilatation or surrounding inflammatory changes. Spleen: Normal in size without focal abnormality. Adrenals/Urinary Tract: Adrenal glands are unremarkable. Kidneys are normal, without renal calculi, focal lesion, or hydronephrosis. Bladder is unremarkable. Stomach/Bowel: Distal esophagus, stomach and duodenal sweep are unremarkable. No small bowel wall thickening or dilatation. No evidence of obstruction. A normal appendix is visualized. No colonic dilatation or wall thickening. Scattered colonic diverticula without focal pericolonic inflammation to suggest diverticulitis. Vascular/Lymphatic: There is extensive retroperitoneal and pelvic adenopathy, some of the larger nodes include a left periaortic node measuring up to 3.2 cm (2/43) and a 2 cm aortocaval lymph node (2/35) there is distal abdominal aortic encasement by a extensive retroperitoneal adenopathy. Furthermore  there is likely direct compression and/or direct nodal involvement  of the inferior vena cava (2/40 with the lower IVC appearing expanded by hypoattenuating filling defects. Edematous changes are noted throughout the pelvis with venous engorgement and collateralization. Reproductive: The prostate and seminal vesicles are unremarkable. There is a varicocele seen in the left scrotum. External genitalia are incompletely included on this exam. Other: There is circumferential body wall edema from the level of the pelvis inferiorly more pronounced throughout the left lower extremity. Free fluid is seen in the deep pelvis, likely reactive or secondary to venous occlusion with developing collateralization. Musculoskeletal: No acute osseous abnormality or suspicious osseous lesion. Normal symmetric muscular enhancement. Review of the MIP images confirms the above findings. IMPRESSION: 1. Suboptimal opacification of the pulmonary arteries limits evaluation beyond the lobar level. No large central or lobar pulmonary arterial filling defects are identified. 2. Extensive retroperitoneal and pelvic adenopathy. There is likely direct compression of the lower IVC with the distal IVC and iliac veins appearing expanded by hypoattenuating thrombus. There is complete encasement of the abdominal aorta as well but without luminal narrowing. Findings are highly concerning for metastatic disease, particularly given patient history of testicular cancer. 3. Left scrotal varicocele. External genitalia are incompletely included on this exam. Consider further evaluation scrotal ultrasound given extensive retroperitoneal adenopathy and patient's history of testicular cancer. 4. Free fluid in the deep pelvis is likely reactive or secondary to venous occlusion with developing collateralization. 5. Colonic diverticulosis without evidence of diverticulitis. 6. Mild bilateral gynecomastia, right slightly greater left. 7. 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 Critical Value/emergent results were called by telephone at the time of interpretation on 04/11/2019 at 1:33 am to [place] , who verbally acknowledged these results. Electronically Signed   By: Lovena Le M.D.   On: 04/11/2019 01:33   MR BRAIN W WO CONTRAST  Result Date: 04/15/2019 CLINICAL DATA:  History of testicular cancer. Assess for metastatic disease. EXAM: MRI HEAD WITHOUT AND WITH CONTRAST TECHNIQUE: Multiplanar, multiecho pulse sequences of the brain and surrounding structures were obtained without and with intravenous contrast. CONTRAST:  82m GADAVIST GADOBUTROL 1 MMOL/ML IV SOLN COMPARISON:  None. FINDINGS: Brain: The brain has a normal appearance without evidence of malformation, atrophy, old or acute small or large vessel infarction, mass lesion, hemorrhage, hydrocephalus or extra-axial collection. After contrast administration, no abnormal enhancement occurs. Vascular: Major vessels at the base of the brain show flow. Venous sinuses appear patent. Skull and upper cervical spine: Normal. Sinuses/Orbits: Clear/normal. Other: None significant. IMPRESSION: Normal examination.  No evidence of metastatic disease. Electronically Signed   By: MNelson ChimesM.D.   On: 04/15/2019 11:07   UKoreaScrotum  Result Date: 04/11/2019 CLINICAL DATA:  Concern for testicular cancer EXAM: ULTRASOUND OF SCROTUM TECHNIQUE: Complete ultrasound examination of the testicles, epididymis, and other scrotal structures was performed. COMPARISON:  None. FINDINGS: Right testicle Measurements: Surgically removed. Left testicle Measurements: 3.9 x 2.2 x 2.6 cm. No mass or microlithiasis visualized. Right epididymis:  Not visualized Left epididymis:  Normal in size and appearance. Hydrocele:  Moderate left hydrocele Varicocele:  Moderate left varicocele. IMPRESSION: No left testicular abnormality.  Prior right orchiectomy. Moderate left  hydrocele and varicocele. Electronically Signed   By: KRolm BaptiseM.D.   On: 04/11/2019 02:16   CT ABDOMEN PELVIS W CONTRAST  Result Date: 04/18/2019 CLINICAL DATA:  Testicular carcinoma post RIGHT orchiectomy, LEFT lower extremity DVT, abdominal distension, recent CT with intra-abdominal and retroperitoneal  lymphadenopathy concerning for metastatic cancer EXAM: CT ABDOMEN AND PELVIS WITH CONTRAST TECHNIQUE: Multidetector CT imaging of the abdomen and pelvis was performed using the standard protocol following bolus administration of intravenous contrast. Sagittal and coronal MPR images reconstructed from axial data set. CONTRAST:  156m OMNIPAQUE IOHEXOL 300 MG/ML SOLN IV. No oral contrast. COMPARISON:  04/11/2019 FINDINGS: Lower chest: Dependent bibasilar atelectasis in the lower lobes Hepatobiliary: New dependent density within gallbladder question vicarious excretion of contrast material from prior CT exam versus sludge. No gallbladder wall thickening. Liver unremarkable. Pancreas: Normal appearance Spleen: Normal appearance Adrenals/Urinary Tract: Adrenal glands, kidneys, and ureters normal appearance. Foley catheter and air within urinary bladder. Stomach/Bowel: Retrocecal appendix, normal. Gaseous distention of colon from cecum to descending colon. Small amount of stool in fluid within proximal colon. Tapering of the distal colon with few scattered distal colonic diverticula but no evidence of diverticulitis. Rectosigmoid colon decompressed. No discrete point of obstruction seen; findings favor ileus. Stomach and small bowel loops unremarkable Vascular/Lymphatic: Prior stenting of the IVC. Aorta normal caliber without aneurysm. IVC, iliac veins, common femoral veins patent without thrombus. Previously identified thrombus LEFT external iliac vein no longer seen. Again identified significant retroperitoneal adenopathy unchanged. Reproductive: Seminal vesicles unremarkable. Minimal prostatic prominence for  age. Other: Scattered stranding of retroperitoneal tissue planes increased from previous exam. No free air or free fluid. Tiny LEFT inguinal hernia containing fat Musculoskeletal: Osseous structures unremarkable. New enlargement of the LEFT psoas muscle with in ill-defined area of central low attenuation approximately 3.7 x 3.9 cm in axial dimensions extending 12 cm length, could represent LEFT psoas abscess or hematoma. This is new since 04/11/2019 making tumor infiltration unlikely. IMPRESSION: Mild gaseous distention of the colon through descending colon with decompressed rectosigmoid colon; no point of obstruction is identified, favor ileus. Persistent retroperitoneal adenopathy suspicious for metastatic disease. New ill-defined fluid collection within LEFT psoas muscle 3.7 x 3.9 x 12 cm question psoas abscess versus hematoma; LEFT psoas muscle was traversed by prior CT guided retroperitoneal lymph node biopsy. Increased retroperitoneal and mesenteric stranding. Bibasilar atelectasis. Findings called to Dr. MZigmund Danielon 04/18/2019 at 1108 hours. Electronically Signed   By: MLavonia DanaM.D.   On: 04/18/2019 11:08   CT ABDOMEN PELVIS W CONTRAST  Result Date: 04/11/2019 CLINICAL DATA:  Positive lower extremity DVT, PE suspected. Back pain and leg swelling, history of testicular cancer. EXAM: CT ANGIOGRAPHY CHEST CT ABDOMEN AND PELVIS WITH CONTRAST TECHNIQUE: Multidetector CT imaging of the chest was performed using the standard protocol during bolus administration of intravenous contrast. Multiplanar CT image reconstructions and MIPs were obtained to evaluate the vascular anatomy. Multidetector CT imaging of the abdomen and pelvis was performed using the standard protocol during bolus administration of intravenous contrast. CONTRAST:  1020mOMNIPAQUE IOHEXOL 350 MG/ML SOLN COMPARISON:  None. FINDINGS: CTA CHEST FINDINGS Cardiovascular: Suboptimal opacification of the pulmonary arteries limits evaluation beyond  the lobar level. No large central or lobar pulmonary arterial filling defects are identified. Central pulmonary arteries are normal caliber. Normal cardiac size. 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 The aorta is normal caliber. Shared origin of the brachiocephalic and left common carotid arteries. Great vessels are otherwise unremarkable. Mediastinum/Nodes: No enlarged mediastinal, hilar or axillary adenopathy is seen. No acute abnormality of the trachea or esophagus. Thyroid gland is normal. Asymmetric thickening of the left sternocleidomastoid is likely related to muscle activation. Lungs/Pleura: Respiratory motion artifact is mild.  No consolidation, features of edema, pneumothorax, or effusion. No suspicious pulmonary nodules or masses. Musculoskeletal: No acute osseous abnormality or suspicious osseous lesion in the imaged chest. No worrisome chest wall lesions. Mild bilateral gynecomastia is noted, right slightly greater left. Review of the MIP images confirms the above findings. CT ABDOMEN and PELVIS FINDINGS Hepatobiliary: No focal liver abnormality is seen. No gallstones, gallbladder wall thickening, or biliary dilatation. Pancreas: Unremarkable. No pancreatic ductal dilatation or surrounding inflammatory changes. Spleen: Normal in size without focal abnormality. Adrenals/Urinary Tract: Adrenal glands are unremarkable. Kidneys are normal, without renal calculi, focal lesion, or hydronephrosis. Bladder is unremarkable. Stomach/Bowel: Distal esophagus, stomach and duodenal sweep are unremarkable. No small bowel wall thickening or dilatation. No evidence of obstruction. A normal appendix is visualized. No colonic dilatation or wall thickening. Scattered colonic diverticula without focal pericolonic inflammation to suggest diverticulitis. Vascular/Lymphatic: There is extensive retroperitoneal and pelvic adenopathy, some of the larger nodes  include a left periaortic node measuring up to 3.2 cm (2/43) and a 2 cm aortocaval lymph node (2/35) there is distal abdominal aortic encasement by a extensive retroperitoneal adenopathy. Furthermore there is likely direct compression and/or direct nodal involvement of the inferior vena cava (2/40 with the lower IVC appearing expanded by hypoattenuating filling defects. Edematous changes are noted throughout the pelvis with venous engorgement and collateralization. Reproductive: The prostate and seminal vesicles are unremarkable. There is a varicocele seen in the left scrotum. External genitalia are incompletely included on this exam. Other: There is circumferential body wall edema from the level of the pelvis inferiorly more pronounced throughout the left lower extremity. Free fluid is seen in the deep pelvis, likely reactive or secondary to venous occlusion with developing collateralization. Musculoskeletal: No acute osseous abnormality or suspicious osseous lesion. Normal symmetric muscular enhancement. Review of the MIP images confirms the above findings. IMPRESSION: 1. Suboptimal opacification of the pulmonary arteries limits evaluation beyond the lobar level. No large central or lobar pulmonary arterial filling defects are identified. 2. Extensive retroperitoneal and pelvic adenopathy. There is likely direct compression of the lower IVC with the distal IVC and iliac veins appearing expanded by hypoattenuating thrombus. There is complete encasement of the abdominal aorta as well but without luminal narrowing. Findings are highly concerning for metastatic disease, particularly given patient history of testicular cancer. 3. Left scrotal varicocele. External genitalia are incompletely included on this exam. Consider further evaluation scrotal ultrasound given extensive retroperitoneal adenopathy and patient's history of testicular cancer. 4. Free fluid in the deep pelvis is likely reactive or secondary to venous  occlusion with developing collateralization. 5. Colonic diverticulosis without evidence of diverticulitis. 6. Mild bilateral gynecomastia, right slightly greater left. 7. 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 Critical Value/emergent results were called by telephone at the time of interpretation on 04/11/2019 at 1:33 am to [place] , who verbally acknowledged these results. Electronically Signed   By: Lovena Le M.D.   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 Patient has experienced minimal improvement since initiation of IV heparin, and as such request made for initiation of catheter directed thrombolysis for symptomatic purposes. EXAM: 1. ULTRASOUND GUIDANCE FOR VASCULAR ACCESS X2 2. FLUOROSCOPIC GUIDED PHARMACOLOGICAL AND MECHANICAL THROMBECTOMY WITH ANGIOJET DEVICE 3. BILATERAL LOWER EXTREMITY  VENOGRAM AND PLACEMENT OF INFUSION CATHETERS COMPARISON:  CT chest, abdomen pelvis-04/11/2019; abdominal MRV - 04/13/2019 MEDICATIONS: Hydralazine 10 mg IV, administered near the completion of the procedure secondary to persistently elevated blood pressure. CONTRAST:  60 cc Omnipaque 300 ANESTHESIA/SEDATION: Moderate (conscious) sedation was employed during this procedure. A total of Versed 3 mg and Fentanyl 125 mcg was administered intravenously. Moderate Sedation Time: 67 minutes. The patient's level of consciousness and vital signs were monitored continuously by radiology nursing throughout the procedure under my direct supervision. FLUOROSCOPY TIME:  18 minutes, 18 seconds (875 mGy) COMPLICATIONS: None immediate. TECHNIQUE: Informed written consent was obtained from the patient after a discussion of the  risks (including but not limited to bleeding, including nontarget bleeding given history of malignancy, infection, pulmonary embolism and necessity for venous angioplasty and/or stent placement), benefits and alternatives to treatment. Questions regarding the procedure were encouraged and answered. A timeout was performed prior to the initiation of the procedure. The patient was placed prone on the fluoroscopy table and the skin posterior to the bilateral knees was prepped and draped in the usual sterile fashion, and a sterile drape was applied covering the operative field. Maximum barrier sterile technique with sterile gowns and gloves were used for the procedure. Under direct ultrasound guidance, the left popliteal vein was access with a micropuncture kit after the overlying soft tissues were anesthetized with 1% lidocaine. An ultrasound image was saved for documentation purposed. This allowed for placement of a 8-French vascular sheath. A limited venogram was performed through the side arm of the vascular sheath. With the use of a regular glidewire, a Kumpe catheter was advanced through the left femoral, external iliac vein and through the left common iliac vein to the level of the IVC. Limited vena cavogram demonstrates complete occlusion of the distal aspect of the IVC extending to involve both left common iliac veins. Ultimately, a moderate to long segment apparently extrinsic malignant narrowing involving the mid/distal aspect of the IVC was successfully traversed with inferior venogram demonstrating patency of the more cranial infrarenal IVC. Next, with the use of a Angiojet device, approximately 250 cc of a solution of 10 mg of tPA mixed in 500 mg of saline was infused throughout the occluded segment of the IVC, left common and external iliac veins to the level of the left common femoral vein. Attention was now paid towards access of the right lower extremity popliteal vein. After the overlying soft tissues  were anesthetized with 1% lidocaine, the right popliteal vein was accessed micropuncture kit. Limited venogram was performed through the outer micro puncture sheath. This allowed for placement of a 6 French, 35 cm vascular sheath. Again, with the use of a regular glidewire, a Kumpe catheter was advanced beyond the obstructing caval lesion. Contrast injection confirmed appropriate positioning. Next, after approximately 20 minute tPA dwell, mechanical thrombectomy was performed with the Angiojet device both from the left lower extremity access as well as the right lower extremity access. Limited vena cavagram was performed via the right lower extremity access. Kumpe catheter was utilized for measurement purposes and ultimately a 90 cm/20-cm infusion catheter was placed from the right lower extremity access and a 90 cm/30 cm infusion catheter was placed from the left lower extremity access adequately covering the occluded segment of the bilateral venous systems as well as the caudal aspect of the IVC. Postprocedural spot fluoroscopic images were obtained. Both popliteal vascular sheath were secured in place. Dressings were applied. Patient's was subjectively cold by the end of  the procedure with associated elevation in his blood pressure and as such he was administered 10 mg of hydralazine and bear hugger warming device was applied. The patient otherwise tolerated the procedure well without immediate postprocedural complication. FINDINGS: Sonographic evaluation demonstrates patency of the bilateral popliteal veins. Contrast injection demonstrates patency of the bilateral popliteal and superficial femoral veins. There is occlusive thrombus extending from the left common femoral vein and the right common iliac vein through the mid/caudal aspect of the IVC secondary to a moderate to long segment length presumably extrinsic malignant occlusion secondary to known adjacent bulky retroperitoneal lymphadenopathy. Following  pharmacological and mechanical thrombectomy as detailed above, there is restored flow as demonstrated on limited right central pelvic venogram with large amount of residual thrombus. Following infusion catheter placement, both side ports traverse both occluded segments of the IVC and pelvic venous systems. IMPRESSION: 1. 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 2. Successful initiation of bilateral mechanical and pharmacologic thrombectomy with bilateral infusion catheters appropriately positioned. PLAN: The patient will be transferred to ICU for overnight lytic infusion and will return tomorrow for repeat venogram and potential intervention. Electronically Signed   By: Sandi Mariscal M.D.   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 Patient has experienced minimal improvement since initiation of IV heparin, and as such request made for initiation of catheter directed thrombolysis for symptomatic purposes. EXAM: 1. ULTRASOUND GUIDANCE FOR VASCULAR ACCESS X2 2. FLUOROSCOPIC GUIDED PHARMACOLOGICAL AND MECHANICAL THROMBECTOMY WITH ANGIOJET DEVICE 3. BILATERAL LOWER EXTREMITY VENOGRAM AND PLACEMENT OF INFUSION CATHETERS COMPARISON:  CT chest, abdomen pelvis-04/11/2019; abdominal MRV - 04/13/2019 MEDICATIONS: Hydralazine 10 mg IV, administered near the completion of the procedure secondary to persistently elevated blood pressure. CONTRAST:  60 cc Omnipaque 300 ANESTHESIA/SEDATION: Moderate (conscious) sedation was employed during this procedure. A total of Versed 3 mg and Fentanyl 125 mcg was administered intravenously. Moderate Sedation Time: 67 minutes. The patient's level  of consciousness and vital signs were monitored continuously by radiology nursing throughout the procedure under my direct supervision. FLUOROSCOPY TIME:  18 minutes, 18 seconds (295 mGy) COMPLICATIONS: None immediate. TECHNIQUE: Informed written consent was obtained from the patient after a discussion of the risks (including but not limited to bleeding, including nontarget bleeding given history of malignancy, infection, pulmonary embolism and necessity for venous angioplasty and/or stent placement), benefits and alternatives to treatment. Questions regarding the procedure were encouraged and answered. A timeout was performed prior to the initiation of the procedure. The patient was placed prone on the fluoroscopy table and the skin posterior to the bilateral knees was prepped and draped in the usual sterile fashion, and a sterile drape was applied covering the operative field. Maximum barrier sterile technique with sterile gowns and gloves were used for the procedure. Under direct ultrasound guidance, the left popliteal vein was access with a micropuncture kit after the overlying soft tissues were anesthetized with 1% lidocaine. An ultrasound image was saved for documentation purposed. This allowed for placement of a 8-French vascular sheath. A limited venogram was performed through the side arm of the vascular sheath. With the use of a regular glidewire, a Kumpe catheter was advanced through the left femoral, external iliac vein and through the left common iliac  vein to the level of the IVC. Limited vena cavogram demonstrates complete occlusion of the distal aspect of the IVC extending to involve both left common iliac veins. Ultimately, a moderate to long segment apparently extrinsic malignant narrowing involving the mid/distal aspect of the IVC was successfully traversed with inferior venogram demonstrating patency of the more cranial infrarenal IVC. Next, with the use of a Angiojet device, approximately 250 cc  of a solution of 10 mg of tPA mixed in 500 mg of saline was infused throughout the occluded segment of the IVC, left common and external iliac veins to the level of the left common femoral vein. Attention was now paid towards access of the right lower extremity popliteal vein. After the overlying soft tissues were anesthetized with 1% lidocaine, the right popliteal vein was accessed micropuncture kit. Limited venogram was performed through the outer micro puncture sheath. This allowed for placement of a 6 French, 35 cm vascular sheath. Again, with the use of a regular glidewire, a Kumpe catheter was advanced beyond the obstructing caval lesion. Contrast injection confirmed appropriate positioning. Next, after approximately 20 minute tPA dwell, mechanical thrombectomy was performed with the Angiojet device both from the left lower extremity access as well as the right lower extremity access. Limited vena cavagram was performed via the right lower extremity access. Kumpe catheter was utilized for measurement purposes and ultimately a 90 cm/20-cm infusion catheter was placed from the right lower extremity access and a 90 cm/30 cm infusion catheter was placed from the left lower extremity access adequately covering the occluded segment of the bilateral venous systems as well as the caudal aspect of the IVC. Postprocedural spot fluoroscopic images were obtained. Both popliteal vascular sheath were secured in place. Dressings were applied. Patient's was subjectively cold by the end of the procedure with associated elevation in his blood pressure and as such he was administered 10 mg of hydralazine and bear hugger warming device was applied. The patient otherwise tolerated the procedure well without immediate postprocedural complication. FINDINGS: Sonographic evaluation demonstrates patency of the bilateral popliteal veins. Contrast injection demonstrates patency of the bilateral popliteal and superficial femoral veins.  There is occlusive thrombus extending from the left common femoral vein and the right common iliac vein through the mid/caudal aspect of the IVC secondary to a moderate to long segment length presumably extrinsic malignant occlusion secondary to known adjacent bulky retroperitoneal lymphadenopathy. Following pharmacological and mechanical thrombectomy as detailed above, there is restored flow as demonstrated on limited right central pelvic venogram with large amount of residual thrombus. Following infusion catheter placement, both side ports traverse both occluded segments of the IVC and pelvic venous systems. IMPRESSION: 1. 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 2. Successful initiation of bilateral mechanical and pharmacologic thrombectomy with bilateral infusion catheters appropriately positioned. PLAN: The patient will be transferred to ICU for overnight lytic infusion and will return tomorrow for repeat venogram and potential intervention. Electronically Signed   By: Sandi Mariscal M.D.   On: 04/15/2019 17:04   US RENAL  Result Date: 04/22/2019 CLINICAL DATA:  Elevated serum creatinine level. EXAM: RENAL / URINARY TRACT ULTRASOUND COMPLETE COMPARISON:  April 17, 2019. FINDINGS: Right Kidney: Renal measurements: 13.9 x 6.4 x 6.3 cm = volume: 294 mL . Echogenicity within normal limits. No mass or hydronephrosis visualized. Left Kidney: Renal measurements: 12.2 x 6.3 x 6.2 cm = volume: 246 mL. Echogenicity within  normal limits. No mass or hydronephrosis visualized. Bladder: Appears normal for degree of bladder distention. Foley catheter is noted. Other: None. IMPRESSION: No significant renal abnormality is noted. Electronically Signed   By: Marijo Conception M.D.   On: 04/22/2019 16:53   US RENAL  Result Date: 04/17/2019 CLINICAL DATA:  Urinary retention.  History of testicular carcinoma.  EXAM: RENAL / URINARY TRACT ULTRASOUND COMPLETE COMPARISON:  CT scan 04/11/2019 FINDINGS: Right Kidney: Renal measurements: 13.5 x 5.5 x 6.6 cm = volume: 260.0 mL . Normal renal cortical thickness and echogenicity without focal lesions or hydronephrosis. Left Kidney: Renal measurements: 12.0 x 6.8 x 5.4 cm = volume: 229.4 mL. Normal renal cortical thickness and echogenicity without focal lesions or hydronephrosis. Bladder: Decompressed by Foley catheter. Other: None. IMPRESSION: Normal sonographic appearance of both kidneys.  No hydronephrosis. Bladder decompressed by a Foley catheter. Electronically Signed   By: Marijo Sanes M.D.   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  Patient has experienced minimal improvement since initiation of IV heparin, and as such request made for initiation of catheter directed thrombolysis for symptomatic purposes.  EXAM: 1. ULTRASOUND GUIDANCE FOR VASCULAR ACCESS X2 2. FLUOROSCOPIC GUIDED PHARMACOLOGICAL AND MECHANICAL THROMBECTOMY WITH ANGIOJET DEVICE 3. BILATERAL LOWER EXTREMITY VENOGRAM AND PLACEMENT OF INFUSION CATHETERS  COMPARISON:  CT chest, abdomen pelvis-04/11/2019; abdominal MRV - 04/13/2019  MEDICATIONS: Hydralazine 10 mg IV, administered near the completion of the procedure secondary to persistently elevated blood pressure.  CONTRAST:  60 cc Omnipaque 300  ANESTHESIA/SEDATION: Moderate (conscious) sedation was employed during this procedure. A total of Versed 3 mg and Fentanyl 125 mcg was administered intravenously.  Moderate Sedation Time: 67 minutes. The patient's level of consciousness and vital signs were monitored continuously by radiology nursing throughout the procedure under my direct supervision.  FLUOROSCOPY  TIME:  18 minutes, 18 seconds (867 mGy)  COMPLICATIONS: None immediate.  TECHNIQUE: Informed written consent was obtained from the patient after a discussion of the risks (including but not limited to bleeding, including nontarget bleeding given history of malignancy, infection, pulmonary embolism and necessity for venous angioplasty and/or stent placement), benefits and alternatives to treatment. Questions regarding the procedure were encouraged and answered. A timeout was performed prior to the initiation of the procedure.  The patient was placed prone on the fluoroscopy table and the skin posterior to the bilateral knees was prepped and draped in the usual sterile fashion, and a sterile drape was applied covering the operative field. Maximum barrier sterile technique with sterile gowns and gloves were used for the procedure.  Under direct ultrasound guidance, the left popliteal vein was access with a micropuncture kit after the overlying soft tissues were anesthetized with 1% lidocaine. An ultrasound image was saved for documentation purposed. This allowed for placement of a 8-French vascular sheath. A limited venogram was performed through the side arm of the vascular sheath.  With the use of a regular glidewire, a Kumpe catheter was advanced through the left femoral, external iliac vein and through the left common iliac vein to the level of the IVC.  Limited vena cavogram demonstrates complete occlusion of the distal aspect of the IVC extending to involve both left common iliac veins.  Ultimately, a moderate to long segment apparently extrinsic malignant narrowing involving the mid/distal aspect of the  IVC was successfully traversed with inferior venogram demonstrating patency of the more cranial infrarenal IVC.  Next, with the use of a Angiojet device, approximately 250 cc of a solution of 10 mg of tPA mixed in 500 mg of saline was infused throughout the occluded segment of the IVC, left common and  external iliac veins to the level of the left common femoral vein.  Attention was now paid towards access of the right lower extremity popliteal vein.  After the overlying soft tissues were anesthetized with 1% lidocaine, the right popliteal vein was accessed micropuncture kit. Limited venogram was performed through the outer micro puncture sheath. This allowed for placement of a 6 French, 35 cm vascular sheath.  Again, with the use of a regular glidewire, a Kumpe catheter was advanced beyond the obstructing caval lesion. Contrast injection confirmed appropriate positioning.  Next, after approximately 20 minute tPA dwell, mechanical thrombectomy was performed with the Angiojet device both from the left lower extremity access as well as the right lower extremity access.  Limited vena cavagram was performed via the right lower extremity access.  Kumpe catheter was utilized for measurement purposes and ultimately a 90 cm/20-cm infusion catheter was placed from the right lower extremity access and a 90 cm/30 cm infusion catheter was placed from the left lower extremity access adequately covering the occluded segment of the bilateral venous systems as well as the caudal aspect of the IVC.  Postprocedural spot fluoroscopic images were obtained.  Both popliteal vascular sheath were secured in place. Dressings were applied.  Patient's was subjectively cold by the end of the procedure with associated elevation in his blood pressure and as such he was administered 10 mg of hydralazine and bear hugger warming device was applied.  The patient otherwise tolerated the procedure well without immediate postprocedural complication.  FINDINGS: Sonographic evaluation demonstrates patency of the bilateral popliteal veins.  Contrast injection demonstrates patency of the bilateral popliteal and superficial femoral veins.  There is occlusive thrombus extending from the left common femoral vein and the right common iliac vein  through the mid/caudal aspect of the IVC secondary to a moderate to long segment length presumably extrinsic malignant occlusion secondary to known adjacent bulky retroperitoneal lymphadenopathy.  Following pharmacological and mechanical thrombectomy as detailed above, there is restored flow as demonstrated on limited right central pelvic venogram with large amount of residual thrombus.  Following infusion catheter placement, both side ports traverse both occluded segments of the IVC and pelvic venous systems.  IMPRESSION: 1. 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 2. Successful initiation of bilateral mechanical and pharmacologic thrombectomy with bilateral infusion catheters appropriately positioned.  PLAN: The patient will be transferred to ICU for overnight lytic infusion and will return tomorrow for repeat venogram and potential intervention.   Electronically Signed   By: Sandi Mariscal M.D.   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 Patient has experienced minimal improvement since initiation of IV heparin, and as such request made for initiation of catheter directed thrombolysis for symptomatic purposes. EXAM: 1. ULTRASOUND GUIDANCE FOR VASCULAR ACCESS X2 2. FLUOROSCOPIC GUIDED PHARMACOLOGICAL AND MECHANICAL THROMBECTOMY WITH ANGIOJET DEVICE 3. BILATERAL LOWER EXTREMITY VENOGRAM AND PLACEMENT OF INFUSION CATHETERS COMPARISON:  CT chest, abdomen pelvis-04/11/2019; abdominal MRV - 04/13/2019 MEDICATIONS: Hydralazine 10 mg IV, administered near the completion of the procedure secondary to persistently elevated blood pressure. CONTRAST:  60 cc  Omnipaque 300 ANESTHESIA/SEDATION: Moderate (conscious) sedation was employed during this procedure. A total of Versed 3 mg and Fentanyl 125 mcg was administered intravenously. Moderate Sedation Time: 67 minutes. The patient's level of consciousness and vital signs were monitored continuously by radiology nursing throughout the procedure under my direct supervision. FLUOROSCOPY TIME:  18 minutes, 18 seconds (875 mGy) COMPLICATIONS: None immediate. TECHNIQUE: Informed written consent was obtained from the patient after a discussion of the risks (including but not limited to bleeding, including nontarget bleeding given history of malignancy, infection, pulmonary embolism and necessity for venous angioplasty and/or stent placement), benefits and alternatives to treatment. Questions regarding the procedure were encouraged and answered. A timeout was performed prior to the initiation of the procedure. The patient was placed prone on the fluoroscopy table and the skin posterior to the bilateral knees was prepped and draped in the usual sterile fashion, and a sterile drape was applied covering the operative field. Maximum barrier sterile technique with sterile gowns and gloves were used for the procedure. Under direct ultrasound guidance, the left popliteal vein was access with a micropuncture kit after the overlying soft tissues were anesthetized with 1% lidocaine. An ultrasound image was saved for documentation purposed. This allowed for placement of a 8-French vascular sheath. A limited venogram was performed through the side arm of the vascular sheath. With the use of a regular glidewire, a Kumpe catheter was advanced through the left femoral, external iliac vein and through the left common iliac vein to the level of the IVC. Limited vena cavogram demonstrates complete occlusion of the distal aspect of the IVC extending to involve both left common iliac veins. Ultimately, a moderate to long segment apparently extrinsic  malignant narrowing involving the mid/distal aspect of the IVC was successfully traversed with inferior venogram demonstrating patency of the more cranial infrarenal IVC. Next, with the use of a Angiojet device, approximately 250 cc of a solution of 10 mg of tPA mixed in 500 mg of saline was infused throughout the occluded segment of the IVC, left common and external iliac veins to the level of the left common femoral vein. Attention was now paid towards access of the right lower extremity popliteal vein. After the overlying soft tissues were anesthetized with 1% lidocaine, the right popliteal vein was accessed micropuncture kit. Limited venogram was performed through the outer micro puncture sheath. This allowed for placement of a 6 French, 35 cm vascular sheath. Again, with the use of a regular glidewire, a Kumpe catheter was advanced beyond the obstructing caval lesion. Contrast injection confirmed appropriate positioning. Next, after approximately 20 minute tPA dwell, mechanical thrombectomy was performed with the Angiojet device both from the left lower extremity access as well as the right lower extremity access. Limited vena cavagram was performed via the right lower extremity access. Kumpe catheter was utilized for measurement purposes and ultimately a 90 cm/20-cm infusion catheter was placed from the right lower extremity access and a 90 cm/30 cm infusion catheter was placed from the left lower extremity access adequately covering the occluded segment of the bilateral venous systems as well as the caudal aspect of the IVC. Postprocedural spot fluoroscopic images were obtained. Both popliteal vascular sheath were secured in place. Dressings were applied. Patient's was subjectively cold by the end of the procedure with associated elevation in his blood  pressure and as such he was administered 10 mg of hydralazine and bear hugger warming device was applied. The patient otherwise tolerated the procedure well  without immediate postprocedural complication. FINDINGS: Sonographic evaluation demonstrates patency of the bilateral popliteal veins. Contrast injection demonstrates patency of the bilateral popliteal and superficial femoral veins. There is occlusive thrombus extending from the left common femoral vein and the right common iliac vein through the mid/caudal aspect of the IVC secondary to a moderate to long segment length presumably extrinsic malignant occlusion secondary to known adjacent bulky retroperitoneal lymphadenopathy. Following pharmacological and mechanical thrombectomy as detailed above, there is restored flow as demonstrated on limited right central pelvic venogram with large amount of residual thrombus. Following infusion catheter placement, both side ports traverse both occluded segments of the IVC and pelvic venous systems. IMPRESSION: 1. 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 2. Successful initiation of bilateral mechanical and pharmacologic thrombectomy with bilateral infusion catheters appropriately positioned. PLAN: The patient will be transferred to ICU for overnight lytic infusion and will return tomorrow for repeat venogram and potential intervention. Electronically Signed   By: Sandi Mariscal M.D.   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 Patient has experienced minimal improvement since initiation of IV heparin, and as such request made for initiation of catheter directed thrombolysis for symptomatic purposes. EXAM: 1. ULTRASOUND GUIDANCE FOR VASCULAR ACCESS X2 2. FLUOROSCOPIC GUIDED PHARMACOLOGICAL AND MECHANICAL THROMBECTOMY WITH  ANGIOJET DEVICE 3. BILATERAL LOWER EXTREMITY VENOGRAM AND PLACEMENT OF INFUSION CATHETERS COMPARISON:  CT chest, abdomen pelvis-04/11/2019; abdominal MRV - 04/13/2019 MEDICATIONS: Hydralazine 10 mg IV, administered near the completion of the procedure secondary to persistently elevated blood pressure. CONTRAST:  60 cc Omnipaque 300 ANESTHESIA/SEDATION: Moderate (conscious) sedation was employed during this procedure. A total of Versed 3 mg and Fentanyl 125 mcg was administered intravenously. Moderate Sedation Time: 67 minutes. The patient's level of consciousness and vital signs were monitored continuously by radiology nursing throughout the procedure under my direct supervision. FLUOROSCOPY TIME:  18 minutes, 18 seconds (800 mGy) COMPLICATIONS: None immediate. TECHNIQUE: Informed written consent was obtained from the patient after a discussion of the risks (including but not limited to bleeding, including nontarget bleeding given history of malignancy, infection, pulmonary embolism and necessity for venous angioplasty and/or stent placement), benefits and alternatives to treatment. Questions regarding the procedure were encouraged and answered. A timeout was performed prior to the initiation of the procedure. The patient was placed prone on the fluoroscopy table and the skin posterior to the bilateral knees was prepped and draped in the usual sterile fashion, and a sterile drape was applied covering the operative field. Maximum barrier sterile technique with sterile gowns and gloves were used for the procedure. Under direct ultrasound guidance, the left popliteal vein was access with a micropuncture kit after the overlying soft tissues were anesthetized with 1% lidocaine. An ultrasound image was saved for documentation purposed. This allowed for placement of a 8-French vascular sheath. A limited venogram was performed through the side arm of the vascular sheath. With the use of a regular glidewire, a Kumpe catheter  was advanced through the left femoral, external iliac vein and through the left common iliac vein to the level  of the IVC. Limited vena cavogram demonstrates complete occlusion of the distal aspect of the IVC extending to involve both left common iliac veins. Ultimately, a moderate to long segment apparently extrinsic malignant narrowing involving the mid/distal aspect of the IVC was successfully traversed with inferior venogram demonstrating patency of the more cranial infrarenal IVC. Next, with the use of a Angiojet device, approximately 250 cc of a solution of 10 mg of tPA mixed in 500 mg of saline was infused throughout the occluded segment of the IVC, left common and external iliac veins to the level of the left common femoral vein. Attention was now paid towards access of the right lower extremity popliteal vein. After the overlying soft tissues were anesthetized with 1% lidocaine, the right popliteal vein was accessed micropuncture kit. Limited venogram was performed through the outer micro puncture sheath. This allowed for placement of a 6 French, 35 cm vascular sheath. Again, with the use of a regular glidewire, a Kumpe catheter was advanced beyond the obstructing caval lesion. Contrast injection confirmed appropriate positioning. Next, after approximately 20 minute tPA dwell, mechanical thrombectomy was performed with the Angiojet device both from the left lower extremity access as well as the right lower extremity access. Limited vena cavagram was performed via the right lower extremity access. Kumpe catheter was utilized for measurement purposes and ultimately a 90 cm/20-cm infusion catheter was placed from the right lower extremity access and a 90 cm/30 cm infusion catheter was placed from the left lower extremity access adequately covering the occluded segment of the bilateral venous systems as well as the caudal aspect of the IVC. Postprocedural spot fluoroscopic images were obtained. Both popliteal  vascular sheath were secured in place. Dressings were applied. Patient's was subjectively cold by the end of the procedure with associated elevation in his blood pressure and as such he was administered 10 mg of hydralazine and bear hugger warming device was applied. The patient otherwise tolerated the procedure well without immediate postprocedural complication. FINDINGS: Sonographic evaluation demonstrates patency of the bilateral popliteal veins. Contrast injection demonstrates patency of the bilateral popliteal and superficial femoral veins. There is occlusive thrombus extending from the left common femoral vein and the right common iliac vein through the mid/caudal aspect of the IVC secondary to a moderate to long segment length presumably extrinsic malignant occlusion secondary to known adjacent bulky retroperitoneal lymphadenopathy. Following pharmacological and mechanical thrombectomy as detailed above, there is restored flow as demonstrated on limited right central pelvic venogram with large amount of residual thrombus. Following infusion catheter placement, both side ports traverse both occluded segments of the IVC and pelvic venous systems. IMPRESSION: 1. 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 2. Successful initiation of bilateral mechanical and pharmacologic thrombectomy with bilateral infusion catheters appropriately positioned. PLAN: The patient will be transferred to ICU for overnight lytic infusion and will return tomorrow for repeat venogram and potential intervention. Electronically Signed   By: Sandi Mariscal M.D.   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 Patient has experienced minimal improvement since initiation of IV heparin, and as such request made for initiation of catheter directed thrombolysis for symptomatic purposes. EXAM: 1. ULTRASOUND GUIDANCE FOR VASCULAR ACCESS X2 2.  FLUOROSCOPIC GUIDED PHARMACOLOGICAL AND MECHANICAL THROMBECTOMY WITH ANGIOJET DEVICE 3. BILATERAL LOWER EXTREMITY VENOGRAM AND PLACEMENT OF INFUSION CATHETERS COMPARISON:  CT chest, abdomen pelvis-04/11/2019; abdominal MRV - 04/13/2019 MEDICATIONS: Hydralazine 10 mg IV, administered near the completion of the procedure secondary to persistently elevated blood pressure. CONTRAST:  60 cc Omnipaque 300 ANESTHESIA/SEDATION: Moderate (conscious) sedation was employed during this procedure. A total of Versed 3 mg and Fentanyl 125 mcg was administered intravenously. Moderate Sedation Time: 67 minutes. The patient's level of consciousness and vital signs were monitored continuously by radiology nursing throughout the procedure under my direct supervision. FLUOROSCOPY TIME:  18 minutes, 18 seconds (703 mGy) COMPLICATIONS: None immediate. TECHNIQUE: Informed written consent was obtained from the patient after a discussion of the risks (including but not limited to bleeding, including nontarget bleeding given history of malignancy, infection, pulmonary embolism and necessity for venous angioplasty and/or stent placement), benefits and alternatives to treatment. Questions regarding the procedure were encouraged and answered. A timeout was performed prior to the initiation of the procedure. The patient was placed prone on the fluoroscopy table and the skin posterior to the bilateral knees was prepped and draped in the usual sterile fashion, and a sterile drape was applied covering the operative field. Maximum barrier sterile technique with sterile gowns and gloves were used for the procedure. Under direct ultrasound guidance, the left popliteal vein was access with a micropuncture  kit after the overlying soft tissues were anesthetized with 1% lidocaine. An ultrasound image was saved for documentation purposed. This allowed for placement of a 8-French vascular sheath. A limited venogram was performed through the side arm of the vascular sheath. With the use of a regular glidewire, a Kumpe catheter was advanced through the left femoral, external iliac vein and through the left common iliac vein to the level of the IVC. Limited vena cavogram demonstrates complete occlusion of the distal aspect of the IVC extending to involve both left common iliac veins. Ultimately, a moderate to long segment apparently extrinsic malignant narrowing involving the mid/distal aspect of the IVC was successfully traversed with inferior venogram demonstrating patency of the more cranial infrarenal IVC. Next, with the use of a Angiojet device, approximately 250 cc of a solution of 10 mg of tPA mixed in 500 mg of saline was infused throughout the occluded segment of the IVC, left common and external iliac veins to the level of the left common femoral vein. Attention was now paid towards access of the right lower extremity popliteal vein. After the overlying soft tissues were anesthetized with 1% lidocaine, the right popliteal vein was accessed micropuncture kit. Limited venogram was performed through the outer micro puncture sheath. This allowed for placement of a 6 French, 35 cm vascular sheath. Again, with the use of a regular glidewire, a Kumpe catheter was advanced beyond the obstructing caval lesion. Contrast injection confirmed appropriate positioning. Next, after approximately 20 minute tPA dwell, mechanical thrombectomy was performed with the Angiojet device both from the left lower extremity access as well as the right lower extremity access. Limited vena cavagram was performed via the right lower extremity access. Kumpe catheter was utilized for measurement purposes and ultimately a 90 cm/20-cm infusion  catheter was placed from the right lower extremity access and a 90 cm/30 cm infusion catheter was placed from the left lower extremity access adequately covering the occluded segment of the bilateral venous systems as well as the caudal aspect of the IVC. Postprocedural spot fluoroscopic images were obtained. Both popliteal vascular sheath were secured  in place. Dressings were applied. Patient's was subjectively cold by the end of the procedure with associated elevation in his blood pressure and as such he was administered 10 mg of hydralazine and bear hugger warming device was applied. The patient otherwise tolerated the procedure well without immediate postprocedural complication. FINDINGS: Sonographic evaluation demonstrates patency of the bilateral popliteal veins. Contrast injection demonstrates patency of the bilateral popliteal and superficial femoral veins. There is occlusive thrombus extending from the left common femoral vein and the right common iliac vein through the mid/caudal aspect of the IVC secondary to a moderate to long segment length presumably extrinsic malignant occlusion secondary to known adjacent bulky retroperitoneal lymphadenopathy. Following pharmacological and mechanical thrombectomy as detailed above, there is restored flow as demonstrated on limited right central pelvic venogram with large amount of residual thrombus. Following infusion catheter placement, both side ports traverse both occluded segments of the IVC and pelvic venous systems. IMPRESSION: 1. 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 2. Successful initiation of bilateral mechanical and pharmacologic thrombectomy with bilateral infusion catheters appropriately positioned. PLAN: The patient will be transferred to ICU for overnight lytic infusion and will return tomorrow for repeat venogram and potential  intervention. Electronically Signed   By: Sandi Mariscal M.D.   On: 04/15/2019 17:04   CT BIOPSY  Result Date: 04/12/2019 INDICATION: 39 year old male with a history of testicular cancer previously treated. He presents with new retroperitoneal adenopathy EXAM: CT BIOPSY MEDICATIONS: None. ANESTHESIA/SEDATION: Moderate (conscious) sedation was employed during this procedure. A total of Versed 2.0 mg and Fentanyl 100 mcg was administered intravenously. Moderate Sedation Time: 14 minutes. The patient's level of consciousness and vital signs were monitored continuously by radiology nursing throughout the procedure under my direct supervision. FLUOROSCOPY TIME:  CT COMPLICATIONS: None PROCEDURE: Informed written consent was obtained from the patient after a thorough discussion of the procedural risks, benefits and alternatives. All questions were addressed. Maximal Sterile Barrier Technique was utilized including caps, mask, sterile gowns, sterile gloves, sterile drape, hand hygiene and skin antiseptic. A timeout was performed prior to the initiation of the procedure. Patient positioned prone position on CT gantry table. Scout CT acquired for planning purposes. The patient was then prepped and draped in the usual sterile fashion. 1% lidocaine was used for local anesthesia. Introducer needle was then placed targeting the left-sided retroperitoneal lymphadenopathy. Once we confirmed needle tip position, multiple 18 gauge core biopsy were acquired. Specimen placed in the saline as S fresh specimen. Needle was removed and a final CT was acquired. Patient tolerated the procedure well and remained hemodynamically stable throughout. No complications were encountered and no significant blood loss. IMPRESSION: Status post CT-guided biopsy of left retroperitoneal lymphadenopathy. Signed, Dulcy Fanny. Dellia Nims, RPVI [facility] Electronically Signed   By: [name] D.O.    On: 04/12/2019 12:56   DG Chest Port 1 View  Result Date: 04/24/2019 CLINICAL DATA:  39 year old male status post PICC placement. EXAM: PORTABLE CHEST 1 VIEW COMPARISON:  Chest radiograph dated 04/18/2019. FINDINGS: Left-sided PICC with tip over the right atrium close to the cavoatrial junction. Faint bilateral lower lung field densities may represent atelectasis or atypical infection. Clinical correlation recommended. There is no pleural effusion or pneumothorax. The cardiac silhouette is within normal limits. No acute osseous pathology. IMPRESSION: 1. Left-sided PICC with tip over the right atrium. 2. Faint bilateral lower lung  field atelectasis or developing infiltrate. Electronically Signed   By: Anner Crete M.D.   On: 04/24/2019 18:07   DG Chest Port 1 View  Result Date: 04/18/2019 CLINICAL DATA:  Fever, testicular cancer EXAM: PORTABLE CHEST 1 VIEW COMPARISON:  None. FINDINGS: Normal heart size. Normal mediastinal contour. No pneumothorax. No pleural effusion. Slightly low lung volumes. Minimal streaky opacities at both lung bases. No pulmonary edema. IMPRESSION: Slightly low lung volumes. Minimal streaky opacities at the lung bases, favoring minimal atelectasis. No consolidative airspace disease to suggest a pneumonia. Electronically Signed   By: Ilona Sorrel M.D.   On: 04/18/2019 15:41   DG Abd Portable 1V  Result Date: 04/25/2019 CLINICAL DATA:  Abdominal distension, history of testicular cancer, history of DVT EXAM: PORTABLE ABDOMEN - 1 VIEW COMPARISON:  04/21/2019, 04/22/2019 FINDINGS: 2 supine frontal views of the abdomen and pelvis demonstrate IVC stent unchanged. Diffuse gaseous distension of the large and small bowel is stable consistent with ileus. No masses or abnormal calcifications. IMPRESSION: 1. Stable gaseous distension of the bowel compatible with ileus. Electronically Signed   By: Randa Ngo M.D.   On: 04/25/2019 16:22   DG Abd Portable 1V  Result Date:  04/18/2019 CLINICAL DATA:  Abdominal pain and distension EXAM: PORTABLE ABDOMEN - 1 VIEW COMPARISON:  04/11/2019 FINDINGS: Supine frontal view of the abdomen and pelvis excludes the hemidiaphragms, right flank, and lower pelvis by collimation. There is diffuse gaseous distention of the colon, likely reflecting postoperative ileus. Stable position of the IVC stent. No masses or abnormal calcifications. IMPRESSION: 1. Distended gas-filled colon consistent with postprocedural ileus. 2. IVC stent as above Electronically Signed   By: Randa Ngo M.D.   On: 04/18/2019 08:41   ECHOCARDIOGRAM COMPLETE  Result Date: 04/19/2019   ECHOCARDIOGRAM REPORT   Patient Name:   Timothy [name] Date of Exam: 04/19/2019 Medical Rec #:  [id number]      Height:       75.0 in Accession #:    [id number]     Weight:       216.9 lb Date of Birth:  Oct 16, 1980       BSA:          2.27 m Patient Age:    38 years       BP:           132/83 mmHg Patient Gender: M              HR:           125 bpm. Exam Location:  Inpatient Procedure: 2D Echo, Color Doppler and Cardiac Doppler                          MODIFIED REPORT: This report was modified by Cherlynn Kaiser MD on 04/19/2019 due to.  Indications:     Fever  History:         Patient has no prior history of Echocardiogram examinations.                  Testicular cancer; Signs/Symptoms:Fever.  Sonographer:     Johny Chess Referring Phys:  [id number] Noland Fordyce MATHEWS Diagnosing Phys: Cherlynn Kaiser MD  Sonographer Comments: Suboptimal apical window. IMPRESSIONS  1. Left ventricular ejection fraction, by visual estimation, is 60 to 65%. The left ventricle has normal function. Mildly increased left ventricular posterior wall thickness. There is mildly increased left ventricular hypertrophy.  2. Global right ventricle has normal systolic function.The right  ventricular size is normal. Right vetricular wall thickness was not assessed.  3. The mitral valve is normal in structure. No evidence of  mitral valve regurgitation. No evidence of mitral stenosis.  4. The tricuspid valve is normal in structure. Tricuspid valve regurgitation is not demonstrated.  5. The aortic valve is normal in structure. Aortic valve regurgitation is not visualized. No evidence of aortic valve sclerosis or stenosis.  6. The inferior vena cava is normal in size with greater than 50% respiratory variability, suggesting right atrial pressure of 3 mmHg.  7. No evidence of valvular vegetations on this transthoracic echocardiogram, however acoustic windows are suboptimal. FINDINGS  Left Ventricle: Left ventricular ejection fraction, by visual estimation, is 60 to 65%. The left ventricle has normal function. The left ventricle is not well visualized. Mildly increased left ventricular posterior wall thickness. There is mildly increased left ventricular hypertrophy. Left ventricular diastolic parameters were normal. Normal left atrial pressure. Right Ventricle: The right ventricular size is normal. Right vetricular wall thickness was not assessed. Global RV systolic function is has normal systolic function. Left Atrium: Left atrial size was normal in size. Right Atrium: Right atrial size was normal in size Pericardium: There is no evidence of pericardial effusion. Mitral Valve: The mitral valve is normal in structure. No evidence of mitral valve regurgitation. No evidence of mitral valve stenosis by observation. Tricuspid Valve: The tricuspid valve is normal in structure. Tricuspid valve regurgitation is not demonstrated. Aortic Valve: The aortic valve is normal in structure. Aortic valve regurgitation is not visualized. The aortic valve is structurally normal, with no evidence of sclerosis or stenosis. Pulmonic Valve: The pulmonic valve was normal in structure. Pulmonic valve regurgitation is not visualized. Pulmonic regurgitation is not visualized. Aorta: The aortic root is normal in size and structure. Venous: The inferior vena cava is  normal in size with greater than 50% respiratory variability, suggesting right atrial pressure of 3 mmHg. IAS/Shunts: The interatrial septum was not well visualized. Additional Comments: No evidence of valvular vegetations on this transthoracic echocardiogram. Would recommend a transesophageal echocardiogram to exclude infective endocarditis if clinically indicated.  LEFT VENTRICLE PLAX 2D LVIDd:         5.00 cm  Diastology LVIDs:         3.10 cm  LV e' lateral: 14.10 cm/s LV PW:         1.10 cm  LV e' medial:  12.30 cm/s LV IVS:        0.80 cm LVOT diam:     2.40 cm LV SV:         80 ml LV SV Index:   35.02 LVOT Area:     4.52 cm  RIGHT VENTRICLE RV S prime:     27.10 cm/s TAPSE (M-mode): 2.8 cm LEFT ATRIUM           Index LA diam:      3.40 cm 1.50 cm/m LA Vol (A4C): 38.8 ml 17.08 ml/m  AORTIC VALVE LVOT Vmax:   105.00 cm/s LVOT Vmean:  74.200 cm/s LVOT VTI:    0.167 m  AORTA Ao Root diam: 3.60 cm  SHUNTS Systemic VTI:  0.17 m Systemic Diam: 2.40 cm  Cherlynn Kaiser MD Electronically signed by Cherlynn Kaiser MD Signature Date/Time: 04/19/2019/7:59:29 PM    Final (Updated)    IR TRANSCATH PLC STENT 1ST ART NOT LE CV CAR VERT CAR  Result Date: 04/16/2019 INDICATION: History of newly diagnosed recurrent testicular cancer, now with bulky retroperitoneal lymphadenopathy resulting in inferior  vena cava occlusion, bilateral pelvic DVT and left lower extremity DVT. Patient was initiated bilateral pelvic/IVC catheter directed thrombolysis day prior (04/15/2019), and returns today following 24 hours of tPA infusion. EXAM: 1. IR THROMB F/U EVAL ART/VEN FINAL DAY 2. FLUOROSCOPIC GUIDED ANGIOPLASTY AND STENT PLACEMENT OF THE IVC COMPARISON:  Initiation of bilateral catheter directed pelvic venous and IVC thrombolysis - 04/15/2019; CT abdomen pelvis - 04/11/2019 MEDICATIONS: Heparin 7000 units CONTRAST:  85 cc Omnipaque 300 ANESTHESIA/SEDATION: Moderate (conscious) sedation was employed during this procedure. A total of  Versed 2 mg and Dilaudid 3 mg was administered intravenously. Moderate Sedation Time: 128 minutes. The patient's level of consciousness and vital signs were monitored continuously by radiology nursing throughout the procedure under my direct supervision. FLUOROSCOPY TIME:  14 minutes, 12 seconds (600.4 mGy) COMPLICATIONS: None immediate. TECHNIQUE: Informed written consent was obtained from the patient after a discussion of the risks, benefits and alternatives to treatment. Questions regarding the procedure were encouraged and answered. A timeout was performed prior to the initiation of the procedure. The patient was placed prone on the fluoroscopy table and the skin posterior to the bilateral knees as well as the external portion the existing bilateral vascular sheaths and infusion catheters was prepped and draped in usual sterile fashion. The surrounding subcutaneous tissues about both popliteal approach vascular sheath was anesthetized with 1% lidocaine with epinephrine. Next, post lysis venograms were performed from the bilateral infusion catheters following removal of the bilateral infusion wires. Images were reviewed and the decision was made to proceed with additional mechanical thrombectomy. With the use of a vertebral catheter exchange length stiff Glidewires were advanced from both existing popliteal vascular sheaths superior to the nearly obstructing caval lesion. Contrast injection confirmed appropriate positioning. Stiff glide wires were advanced into the left subclavian vein. 4000 units of heparin was administered intravenously. Next, beginning with the left lower extremity, the existing 8 French vascular sheath was exchanged over the glidewire for a 13 [place] vascular sheath. The 16 mm diameter Clottreiever Inari mechanical thrombectomy device was then utilized to perform 3 passes from the left popliteal approach from at and just below level of the obstructing caval lesion. Post thrombectomy images  were obtained from the left lower extremity. Next, the existing right lower extremity 6 French popliteal vein vascular sheath was exchanged over a guidewire for a 13 French vascular sheath. Again, from the right lower extremity approach, the 16 mm diameter Clottreiever Inari mechanical thrombectomy device was then utilized to perform 3 passes from the level at and just below the obstructing caval lesion. Post thrombectomy images were obtained from the right lower extremity. An ACT level was obtained and an additional 3000 units of heparin was administered intravenously. The long segment eccentric irregular narrowing/subtotal occlusion of the IVC was subsequently balloon angioplastied at multiple stations with a 14 mm x 4 cm Atlas balloon. Despite adequate balloon apposition, post angioplasty venogram images demonstrate a recurrent hemodynamically significant narrowing. As such, a 20 mm x 60 mm Venovo venous stent was deployed across the superior and mid aspect of the caval lesion and was subsequently balloon angioplastied at multiple stations initially with a 16 mm x 4 cm atlas balloon and ultimately with an 18 mm x 4 cm balloon. Post stent deployment venogram images were obtained however there is felt to be incomplete coverage of the distal aspect of the malignant narrowing. As such, an additional 20 mm x 40 mm Venovo venous stent was deployed overlapping the mid/caudal aspect of venous stent and  adequately covering the caudal aspect caval lesion. Both overlapping venous stents were subsequently balloon angioplastied at multiple stations with the 18 mm atlas balloon and completion venogram images were obtained from both lower extremities. Images were reviewed and the procedure was terminated. All wires, catheters and sheaths were removed from the bilateral popliteal vein accesses and hemostasis was achieved with manual compression. Dressings applied. The patient tolerated the procedure well without immediate  postprocedural complication. FINDINGS: Initial post lysis venogram performed from the left lower extremity infusion catheter demonstrates restoration of flow through the left pelvis with moderate amount residual thrombus primarily within the peripheral aspect of the left external iliac vein as well as the left common and central aspect of the left superficial femoral veins. There is passage of contrast beyond the nearly obstructing lesion involving mid/caudal aspect of the IVC. Initial post lysis venogram performed from the right lower extremity demonstrates persistent occlusion at the level of the right common iliac vein. Following 3 passes from each lower extremity with the 16 mm diameter Clottreiever Inari mechanical thrombectomy device, nearly all residual clot burden within the pelvis was removed. 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 This lesion was again noted to be significantly caudal to the confluence of the bilateral renal veins. As such, nearly obstructing caval lesion was treated with overlapping stent deployment of 20 mm diameter Venovo venous stents which were subsequently balloon angioplastied to 18 mm diameter. Completion images demonstrate a technically excellent result with restored brisk flow through the bilateral pelvic systems and the IVC. There is a mild residual narrowing at the level of the mid/caudal aspect of the IVC, though not felt to result in a residual hemodynamically significant stenosis. No evidence of complication. Specifically, no evidence of contrast extravasation or vessel dissection. 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 PLAN: - Maintain IV heparin drip until patient is successfully converted to long-term anticoagulant. - Recommend maintaining SCD devices while in bed. Electronically Signed   By: Sandi Mariscal M.D.   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 EXAM: MRI ABDOMEN WITHOUT AND WITH CONTRAST TECHNIQUE: Multiplanar multisequence MR imaging of the abdomen was performed both before and after the administration of intravenous contrast. CONTRAST:  68m GADAVIST GADOBUTROL 1 MMOL/ML IV SOLN COMPARISON:  CT 04/11/2019 FINDINGS: Vascular: Arterial: Unremarkable course caliber and contour of the visualized abdominal aorta. Unremarkable course caliber and contour of the bilateral iliac arteries and proximal femoral arteries. Bilateral hypogastric arteries are patent. Venous: Right iliac/femoral system: Flow signal is maintained within the visualized proximal femoral veins. Flow signal maintained into the external iliac vein. Absent flow signal with evidence of thrombus involving the proximal right common iliac vein to the confluence and into the IVC. Expansion of the right common iliac vein. Multiple pelvic collateral vessels. Left iliac/femoral system: Absent flow signal through the length of the left iliac venous system extending through the femoral vein. Expansion of the left iliac and femoral venous system. Multiple pelvic collateral veins. IVC: The IVC is not well evaluated at the level  of the adenopathy. Absent flow signal above the confluence of the iliac veins compatible with ilio caval thrombus. The juxtarenal IVC is patent with maintained flow signal and maintain flow signal of the bilateral renal veins. Nonvascular:  Unremarkable musculoskeletal structures. Partially distended urinary bladder. Unremarkable appearance of the visualized kidneys and GI system. Edema of the lower abdominal wall/pelvic body wall, as well as edema of the left proximal lower extremity. 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 Pathologic lymphadenopathy of the retroperitoneum, similar to comparison CT, contributing to external compression of the infrarenal IVC. The current MR is nondiagnostic to confirm or rule out tumor thrombus. Lower abdominal/pelvic wall edema including edema of the proximal left lower extremity. Signed, Dulcy Fanny. Dellia Nims, RPVI [facility] Radiology Electronically Signed   By: [name] D.O.   On: 04/13/2019 14:14   VAS [place] LOWER EXTREMITY VENOUS (DVT)  Result Date: 04/11/2019  Lower Venous Study Indications: Swelling.  Risk Factors: None identified. Anticoagulation: Heparin. Limitations: Poor ultrasound/tissue interface and bowel gas. Comparison Study: No prior studies. Performing Technologist: Oliver Hum RVT  Examination Guidelines: A complete evaluation includes B-mode imaging, spectral Doppler, color Doppler, and power Doppler as needed of all accessible portions of each vessel. Bilateral testing is considered an integral part of a complete examination. Limited examinations for reoccurring indications may be performed as noted.  +---------+---------------+---------+-----------+----------+--------------+ RIGHT    CompressibilityPhasicitySpontaneityPropertiesThrombus Aging +---------+---------------+---------+-----------+----------+--------------+ CFV      Full           Yes      Yes                                 +---------+---------------+---------+-----------+----------+--------------+ SFJ      Full                                                         +---------+---------------+---------+-----------+----------+--------------+ FV Prox  Full                                                        +---------+---------------+---------+-----------+----------+--------------+ FV Mid   Full                                                        +---------+---------------+---------+-----------+----------+--------------+ FV DistalFull                                                        +---------+---------------+---------+-----------+----------+--------------+ PFV      Full                                                        +---------+---------------+---------+-----------+----------+--------------+  POP      Full           Yes      Yes                                 +---------+---------------+---------+-----------+----------+--------------+ PTV      Full                                                        +---------+---------------+---------+-----------+----------+--------------+ PERO     Full                                                        +---------+---------------+---------+-----------+----------+--------------+   +---------+---------------+---------+-----------+----------+--------------+ LEFT     CompressibilityPhasicitySpontaneityPropertiesThrombus Aging +---------+---------------+---------+-----------+----------+--------------+ CFV      None           No       No                   Acute          +---------+---------------+---------+-----------+----------+--------------+ SFJ      None                                         Acute          +---------+---------------+---------+-----------+----------+--------------+ FV Prox  None           No       No                   Acute          +---------+---------------+---------+-----------+----------+--------------+ FV Mid   None           No       No                   Acute           +---------+---------------+---------+-----------+----------+--------------+ FV DistalNone           No       No                   Acute          +---------+---------------+---------+-----------+----------+--------------+ POP      Partial        No       No                   Acute          +---------+---------------+---------+-----------+----------+--------------+ PTV      Full                                                        +---------+---------------+---------+-----------+----------+--------------+ PERO     None  Acute          +---------+---------------+---------+-----------+----------+--------------+ EIV      None           No       No                   Acute          +---------+---------------+---------+-----------+----------+--------------+ Unable to visualize CIV, or IVC due to bowel gas.    Summary: Right: There is no evidence of deep vein thrombosis in the lower extremity. No cystic structure found in the popliteal fossa. Left: Findings consistent with acute deep vein thrombosis involving the left external iliac vein, common femoral vein, left femoral vein, left popliteal vein, and left peroneal veins. No cystic structure found in the popliteal fossa. Unable to visualize CIV, or IVC due to bowel gas.  *See table(s) above for measurements and observations. Electronically signed by Deitra Mayo MD on 04/11/2019 at 4:09:39 PM.    Final    [place] EKG SITE RITE  Result Date: 04/24/2019 If Site Rite image not attached, placement could not be confirmed due to current cardiac rhythm.  [place] EKG SITE RITE  Result Date: 04/21/2019 If Site Rite image not attached, placement could not be confirmed due to current cardiac rhythm.  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 Patient has experienced minimal improvement since initiation of IV heparin, and as such request made for initiation of catheter directed thrombolysis for symptomatic purposes. EXAM: 1. ULTRASOUND GUIDANCE FOR VASCULAR ACCESS X2 2. FLUOROSCOPIC GUIDED PHARMACOLOGICAL AND MECHANICAL THROMBECTOMY WITH ANGIOJET DEVICE 3. BILATERAL LOWER EXTREMITY VENOGRAM AND PLACEMENT OF INFUSION CATHETERS COMPARISON:  CT chest, abdomen pelvis-04/11/2019; abdominal MRV - 04/13/2019 MEDICATIONS: Hydralazine 10 mg IV, administered near the completion of the procedure secondary to persistently elevated blood pressure. CONTRAST:  60 cc Omnipaque 300 ANESTHESIA/SEDATION: Moderate (conscious) sedation was employed during this procedure. A total of Versed 3 mg and Fentanyl 125 mcg was administered intravenously. Moderate Sedation Time: 67 minutes. The patient's level of consciousness and vital signs were monitored continuously by radiology nursing throughout the procedure under my direct supervision. FLUOROSCOPY TIME:  18 minutes, 18 seconds (767 mGy) COMPLICATIONS: None immediate. TECHNIQUE: Informed written consent was obtained from the patient after a discussion of the risks (including but not limited to bleeding, including nontarget bleeding given history of malignancy, infection, pulmonary embolism and necessity for venous angioplasty and/or stent placement), benefits and alternatives to treatment. Questions regarding the procedure were encouraged and answered. A timeout was performed prior to the initiation of the procedure. The patient was placed prone on the fluoroscopy table and the skin posterior to the bilateral knees was prepped and draped in the usual sterile fashion, and a sterile drape was applied covering the operative field. Maximum barrier sterile technique with sterile gowns and gloves were used for the procedure. Under direct ultrasound guidance, the left  popliteal vein was access with a micropuncture kit after the overlying soft tissues were anesthetized with 1% lidocaine. An ultrasound image was saved for documentation purposed. This allowed for placement of a 8-French vascular sheath. A limited venogram was performed through the side arm of the vascular sheath. With the use of a regular glidewire, a Kumpe catheter was advanced through the left femoral, external iliac vein and through the left common iliac vein to  the level of the IVC. Limited vena cavogram demonstrates complete occlusion of the distal aspect of the IVC extending to involve both left common iliac veins. Ultimately, a moderate to long segment apparently extrinsic malignant narrowing involving the mid/distal aspect of the IVC was successfully traversed with inferior venogram demonstrating patency of the more cranial infrarenal IVC. Next, with the use of a Angiojet device, approximately 250 cc of a solution of 10 mg of tPA mixed in 500 mg of saline was infused throughout the occluded segment of the IVC, left common and external iliac veins to the level of the left common femoral vein. Attention was now paid towards access of the right lower extremity popliteal vein. After the overlying soft tissues were anesthetized with 1% lidocaine, the right popliteal vein was accessed micropuncture kit. Limited venogram was performed through the outer micro puncture sheath. This allowed for placement of a 6 French, 35 cm vascular sheath. Again, with the use of a regular glidewire, a Kumpe catheter was advanced beyond the obstructing caval lesion. Contrast injection confirmed appropriate positioning. Next, after approximately 20 minute tPA dwell, mechanical thrombectomy was performed with the Angiojet device both from the left lower extremity access as well as the right lower extremity access. Limited vena cavagram was performed via the right lower extremity access. Kumpe catheter was utilized for measurement  purposes and ultimately a 90 cm/20-cm infusion catheter was placed from the right lower extremity access and a 90 cm/30 cm infusion catheter was placed from the left lower extremity access adequately covering the occluded segment of the bilateral venous systems as well as the caudal aspect of the IVC. Postprocedural spot fluoroscopic images were obtained. Both popliteal vascular sheath were secured in place. Dressings were applied. Patient's was subjectively cold by the end of the procedure with associated elevation in his blood pressure and as such he was administered 10 mg of hydralazine and bear hugger warming device was applied. The patient otherwise tolerated the procedure well without immediate postprocedural complication. FINDINGS: Sonographic evaluation demonstrates patency of the bilateral popliteal veins. Contrast injection demonstrates patency of the bilateral popliteal and superficial femoral veins. There is occlusive thrombus extending from the left common femoral vein and the right common iliac vein through the mid/caudal aspect of the IVC secondary to a moderate to long segment length presumably extrinsic malignant occlusion secondary to known adjacent bulky retroperitoneal lymphadenopathy. Following pharmacological and mechanical thrombectomy as detailed above, there is restored flow as demonstrated on limited right central pelvic venogram with large amount of residual thrombus. Following infusion catheter placement, both side ports traverse both occluded segments of the IVC and pelvic venous systems. IMPRESSION: 1. 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 2. Successful initiation of bilateral mechanical and pharmacologic thrombectomy with bilateral infusion catheters appropriately positioned. PLAN: The patient will be transferred to ICU for overnight lytic infusion and will return  tomorrow for repeat venogram and potential intervention. Electronically Signed   By: Sandi Mariscal M.D.   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 Patient was initiated bilateral pelvic/IVC catheter directed thrombolysis day prior (04/15/2019), and returns today following 24 hours of tPA infusion. EXAM: 1. IR THROMB F/U EVAL ART/VEN FINAL DAY  2. FLUOROSCOPIC GUIDED ANGIOPLASTY AND STENT PLACEMENT OF THE IVC COMPARISON:  Initiation of bilateral catheter directed pelvic venous and IVC thrombolysis - 04/15/2019; CT abdomen pelvis - 04/11/2019 MEDICATIONS: Heparin 7000 units CONTRAST:  85 cc Omnipaque 300 ANESTHESIA/SEDATION: Moderate (conscious) sedation was employed during this procedure. A total of Versed 2 mg and Dilaudid 3 mg was administered intravenously. Moderate Sedation Time: 128 minutes. The patient's level of consciousness and vital signs were monitored continuously by radiology nursing throughout the procedure under my direct supervision. FLUOROSCOPY TIME:  14 minutes, 12 seconds (579.7 mGy) COMPLICATIONS: None immediate. TECHNIQUE: Informed written consent was obtained from the patient after a discussion of the risks, benefits and alternatives to treatment. Questions regarding the procedure were encouraged and answered. A timeout was performed prior to the initiation of the procedure. The patient was placed prone on the fluoroscopy table and the skin posterior to the bilateral knees as well as the external portion the existing bilateral vascular sheaths and infusion catheters was prepped and draped in usual sterile fashion. The surrounding subcutaneous tissues about both popliteal approach vascular sheath was anesthetized with 1% lidocaine with  epinephrine. Next, post lysis venograms were performed from the bilateral infusion catheters following removal of the bilateral infusion wires. Images were reviewed and the decision was made to proceed with additional mechanical thrombectomy. With the use of a vertebral catheter exchange length stiff Glidewires were advanced from both existing popliteal vascular sheaths superior to the nearly obstructing caval lesion. Contrast injection confirmed appropriate positioning. Stiff glide wires were advanced into the left subclavian vein. 4000 units of heparin was administered intravenously. Next, beginning with the left lower extremity, the existing 8 French vascular sheath was exchanged over the glidewire for a 13 [place] vascular sheath. The 16 mm diameter Clottreiever Inari mechanical thrombectomy device was then utilized to perform 3 passes from the left popliteal approach from at and just below level of the obstructing caval lesion. Post thrombectomy images were obtained from the left lower extremity. Next, the existing right lower extremity 6 French popliteal vein vascular sheath was exchanged over a guidewire for a 13 French vascular sheath. Again, from the right lower extremity approach, the 16 mm diameter Clottreiever Inari mechanical thrombectomy device was then utilized to perform 3 passes from the level at and just below the obstructing caval lesion. Post thrombectomy images were obtained from the right lower extremity. An ACT level was obtained and an additional 3000 units of heparin was administered intravenously. The long segment eccentric irregular narrowing/subtotal occlusion of the IVC was subsequently balloon angioplastied at multiple stations with a 14 mm x 4 cm Atlas balloon. Despite adequate balloon apposition, post angioplasty venogram images demonstrate a recurrent hemodynamically significant narrowing. As such, a 20 mm x 60 mm Venovo venous stent was deployed across the superior and mid aspect of  the caval lesion and was subsequently balloon angioplastied at multiple stations initially with a 16 mm x 4 cm atlas balloon and ultimately with an 18 mm x 4 cm balloon. Post stent deployment venogram images were obtained however there is felt to be incomplete coverage of the distal aspect of the malignant narrowing. As such, an additional 20 mm x 40 mm Venovo venous stent was deployed overlapping the mid/caudal aspect of venous stent and adequately covering the caudal aspect caval lesion. Both overlapping venous stents were subsequently balloon angioplastied at multiple stations with the 18 mm atlas balloon and completion venogram images were obtained from both lower extremities. Images were reviewed and the procedure  was terminated. All wires, catheters and sheaths were removed from the bilateral popliteal vein accesses and hemostasis was achieved with manual compression. Dressings applied. The patient tolerated the procedure well without immediate postprocedural complication. FINDINGS: Initial post lysis venogram performed from the left lower extremity infusion catheter demonstrates restoration of flow through the left pelvis with moderate amount residual thrombus primarily within the peripheral aspect of the left external iliac vein as well as the left common and central aspect of the left superficial femoral veins. There is passage of contrast beyond the nearly obstructing lesion involving mid/caudal aspect of the IVC. Initial post lysis venogram performed from the right lower extremity demonstrates persistent occlusion at the level of the right common iliac vein. Following 3 passes from each lower extremity with the 16 mm diameter Clottreiever Inari mechanical thrombectomy device, nearly all residual clot burden within the pelvis was removed. 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 This lesion was again noted to be significantly caudal to the confluence of the bilateral renal veins. As such, nearly obstructing caval lesion was treated with overlapping stent deployment of 20 mm diameter Venovo venous stents which were subsequently balloon angioplastied to 18 mm diameter. Completion images demonstrate a technically excellent result with restored brisk flow through the bilateral pelvic systems and the IVC. There is a mild residual narrowing at the level of the mid/caudal aspect of the IVC, though not felt to result in a residual hemodynamically significant stenosis. No evidence of complication. Specifically, no evidence of contrast extravasation or vessel dissection. 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 PLAN: - Maintain IV heparin drip until patient is successfully converted to long-term anticoagulant. - Recommend maintaining SCD devices while in bed. Electronically Signed   By: Sandi Mariscal M.D.   On: 04/16/2019 16:26    ASSESSMENT AND PLAN:  1.  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  extensive Left lower extremity DVT, s/p thrombolysis and IVC stent placement 4. Fever with unknown origin, probably tumor fever, resolved now  5. Ilius, improving  6. Worsening anemia, s/p one unit blood 2/7 7.deconditioning, continue PT  8.  History of stage Ib seminoma testicular cancer diagnosed in 2012, status post right orchiectomy-no adjuvant chemotherapy given  Recommendations: -He is tolerating chemotherapy well, last event reviewed and potassium is 3.2.  Will add potassium to his pre-/post hydration.  Creatinine remains normal at 0.85.  Hemoglobin has improved  to 8.1.  Adequate for chemotherapy, will proceed day 3 cisplatin and etoposide.   -Continue Lovenox injection twice daily, is not comfortable to do self injection, will ask his fiance to do it at home, she is coming over on Friday or Saturday  -I encourage him to continue physical therapy, he is very motivated -Advance diet per primary team -plan discussed with his nurse and pharmacy  -He will receive day 5 of cycle one of his chemotherapy tomorrow and if he is able to advance his diet and tolerating well, he could discharged home.  We will arrange for outpatient follow-up at [facility].  However, if he stays locally, we can see him at the [facility].  Timothy [name]  04/27/2019 .    Addendum I have seen the patient, examined  him. I agree with the assessment and and plan and have edited the notes.   Pt is tolerating chemo well, lab reviewed, will proceed with day 4 chemo today. Increase KCL to 61mq today in hydration. Continue PT, he will advance his diet today.  Timothy [name] 04/27/2019

## 2019-04-28 ENCOUNTER — Inpatient Hospital Stay (HOSPITAL_COMMUNITY): Payer: BLUE CROSS/BLUE SHIELD

## 2019-04-28 ENCOUNTER — Encounter: Payer: Self-pay | Admitting: Pharmacist

## 2019-04-28 LAB — CBC WITH DIFFERENTIAL/PLATELET
Abs Immature Granulocytes: 0.05 10*3/uL (ref 0.00–0.07)
Basophils Absolute: 0 10*3/uL (ref 0.0–0.1)
Basophils Relative: 0 %
Eosinophils Absolute: 0 10*3/uL (ref 0.0–0.5)
Eosinophils Relative: 0 %
HCT: 21.7 % — ABNORMAL LOW (ref 39.0–52.0)
Hemoglobin: 6.9 g/dL — CL (ref 13.0–17.0)
Immature Granulocytes: 1 %
Lymphocytes Relative: 6 %
Lymphs Abs: 0.5 10*3/uL — ABNORMAL LOW (ref 0.7–4.0)
MCH: 29.6 pg (ref 26.0–34.0)
MCHC: 31.8 g/dL (ref 30.0–36.0)
MCV: 93.1 fL (ref 80.0–100.0)
Monocytes Absolute: 0.1 10*3/uL (ref 0.1–1.0)
Monocytes Relative: 1 %
Neutro Abs: 8.2 10*3/uL — ABNORMAL HIGH (ref 1.7–7.7)
Neutrophils Relative %: 92 %
Platelets: 360 10*3/uL (ref 150–400)
RBC: 2.33 MIL/uL — ABNORMAL LOW (ref 4.22–5.81)
RDW: 15.2 % (ref 11.5–15.5)
WBC: 8.8 10*3/uL (ref 4.0–10.5)
nRBC: 0 % (ref 0.0–0.2)

## 2019-04-28 LAB — PREPARE RBC (CROSSMATCH)

## 2019-04-28 LAB — COMPREHENSIVE METABOLIC PANEL
ALT: 20 U/L (ref 0–44)
AST: 20 U/L (ref 15–41)
Albumin: 2.5 g/dL — ABNORMAL LOW (ref 3.5–5.0)
Alkaline Phosphatase: 53 U/L (ref 38–126)
Anion gap: 5 (ref 5–15)
BUN: 22 mg/dL — ABNORMAL HIGH (ref 6–20)
CO2: 24 mmol/L (ref 22–32)
Calcium: 7 mg/dL — ABNORMAL LOW (ref 8.9–10.3)
Chloride: 108 mmol/L (ref 98–111)
Creatinine, Ser: 0.71 mg/dL (ref 0.61–1.24)
GFR calc Af Amer: >60 mL/min (ref >60)
GFR calc non Af Amer: >60 mL/min (ref >60)
Glucose, Bld: 101 mg/dL — ABNORMAL HIGH (ref 70–99)
Potassium: 3.3 mmol/L — ABNORMAL LOW (ref 3.5–5.1)
Sodium: 137 mmol/L (ref 135–145)
Total Bilirubin: 0.6 mg/dL (ref 0.3–1.2)
Total Protein: 5.6 g/dL — ABNORMAL LOW (ref 6.5–8.1)

## 2019-04-28 LAB — MAGNESIUM: Magnesium: 2 mg/dL (ref 1.7–2.4)

## 2019-04-28 MED ORDER — POTASSIUM CHLORIDE 2 MEQ/ML IV SOLN
Freq: Once | INTRAVENOUS | Status: AC
  Filled 2019-04-28: qty 1000

## 2019-04-28 MED ORDER — SODIUM CHLORIDE 0.9 % IV SOLN
150.0000 mg | Freq: Once | INTRAVENOUS | Status: AC
  Administered 2019-04-28: 14:00:00 150 mg via INTRAVENOUS
  Filled 2019-04-28: qty 5

## 2019-04-28 MED ORDER — SODIUM CHLORIDE 0.9 % IV SOLN: Freq: Once | INTRAVENOUS | Status: AC

## 2019-04-28 MED ORDER — SODIUM CHLORIDE 0.9 % IV SOLN
100.0000 mg/m2 | Freq: Once | INTRAVENOUS | Status: AC
  Administered 2019-04-28: 16:00:00 230 mg via INTRAVENOUS
  Filled 2019-04-28: qty 11.5

## 2019-04-28 MED ORDER — SODIUM CHLORIDE 0.9% IV SOLUTION: Freq: Once | INTRAVENOUS | Status: AC

## 2019-04-28 MED ORDER — SODIUM CHLORIDE 0.9 % IV SOLN
10.0000 mg | Freq: Once | INTRAVENOUS | Status: AC
  Administered 2019-04-28: 10 mg via INTRAVENOUS
  Filled 2019-04-28: qty 1

## 2019-04-28 MED ORDER — FUROSEMIDE 10 MG/ML IJ SOLN
10.0000 mg | Freq: Once | INTRAMUSCULAR | Status: AC
  Administered 2019-04-28: 10 mg via INTRAVENOUS
  Filled 2019-04-28: qty 2

## 2019-04-28 MED ORDER — PALONOSETRON HCL INJECTION 0.25 MG/5ML
0.2500 mg | Freq: Once | INTRAVENOUS | Status: AC
  Administered 2019-04-28: 0.25 mg via INTRAVENOUS
  Filled 2019-04-28: qty 5

## 2019-04-28 MED ORDER — POTASSIUM CHLORIDE CRYS ER 20 MEQ PO TBCR
20.0000 meq | EXTENDED_RELEASE_TABLET | Freq: Two times a day (BID) | ORAL | Status: DC
  Administered 2019-04-28 – 2019-04-29 (×3): 20 meq via ORAL
  Filled 2019-04-28 (×3): qty 1

## 2019-04-28 MED ORDER — SODIUM CHLORIDE 0.9 % IV SOLN
20.0000 mg/m2 | Freq: Once | INTRAVENOUS | Status: AC
  Administered 2019-04-28: 46 mg via INTRAVENOUS
  Filled 2019-04-28: qty 46

## 2019-04-28 NOTE — TOC Benefit Eligibility Note (Signed)
Transition of Care [facility]) Benefit Eligibility Note    Patient Details  Name: Chetan Hamell MRN: [id number] Date of Birth: [birth year]   Medication/Dose: Alveda Reasons 20mg   po, lovenoxnot covered enoxaparin 135mg  sq  Covered?: Yes  Tier: 3 Drug  Prescription Coverage Preferred Pharmacy: local pharmacy  Spoke with Person/Company/Phone Number:: Donna/ [employer] [phone]  Co-Pay: xarelto no generic $477.19, Enoxaparin $20.00  Prior Approval: No          Kerin Salen Phone Number: 04/28/2019, 3:36 PM

## 2019-04-28 NOTE — Progress Notes (Signed)
HEMATOLOGY-ONCOLOGY PROGRESS NOTE  SUBJECTIVE: Pt has been tolerating chemotherapy well overall, no significant nausea, diarrhea or other side effects.  His diet was advanced to regular yesterday, and he tolerated well.  He is able to ambulate in the hallway several times a day, abdominal distention has improved.  Bowel movement is regular and normal.  No other new complaints.  I have reviewed the past medical history, past surgical history, social history and family history with the patient and they are unchanged from previous note.   PHYSICAL EXAMINATION:  Vitals:   04/27/19 2029 04/28/19 0605  BP: 123/70 123/78  Pulse: 71 79  Resp: 18 16  Temp: 98.2 F (36.8 C) 98.2 F (36.8 C)  SpO2: 98% 98%   Filed Weights   04/10/19 2005 04/18/19 0600  Weight: 200 lb (90.7 kg) 216 lb 14.9 oz (98.4 kg)    Intake/Output from previous day: 02/11 0701 - 02/12 0700 In: 240 [P.O.:240] Out: 3925 [Urine:3925]  GENERAL:alert, no distress and comfortable LEGS: Mild bilateral lower extremity edema, more on left,  no tenderness, or skin erythema, no edema on right leg  Abdomen: Distended, soft, nontender, active bowel sounds Lung: Normal clear breath sounds on bilateral lung, no crackling   LABORATORY DATA:  I have reviewed the data as listed CMP Latest Ref Rng & Units 04/28/2019 04/27/2019 04/26/2019  Glucose 70 - 99 mg/dL 101(H) 115(H) 135(H)  BUN 6 - 20 mg/dL 22(H) 24(H) 24(H)  Creatinine 0.61 - 1.24 mg/dL 0.71 0.85 0.81  Sodium 135 - 145 mmol/L 137 139 138  Potassium 3.5 - 5.1 mmol/L 3.3(L) 3.2(L) 3.4(L)  Chloride 98 - 111 mmol/L 108 107 108  CO2 22 - 32 mmol/L _0 Calcium 8.9 - 10.3 mg/dL 7.0(L) 7.4(L) 7.3(L)  Total Protein 6.5 - 8.1 g/dL 5.6(L) 6.6 6.2(L)  Total Bilirubin 0.3 - 1.2 mg/dL 0.6 0.6 1.0  Alkaline Phos 38 - 126 U/L 53 69 70  AST 15 - 41 U/L _1 ALT 0 - 44 U/L _2 Lab Results  Component Value Date   WBC 8.8 04/28/2019   HGB 6.9 (LL) 04/28/2019   HCT  21.7 (L) 04/28/2019   MCV 93.1 04/28/2019   PLT 360 04/28/2019   NEUTROABS 8.2 (H) 04/28/2019    CT ABDOMEN PELVIS WO CONTRAST  Result Date: 04/22/2019 CLINICAL DATA:  Fever, abdominal distension, testicular cancer, status post right orchiectomy EXAM: CT ABDOMEN AND PELVIS WITHOUT CONTRAST TECHNIQUE: Multidetector CT imaging of the abdomen and pelvis was performed following the standard protocol without IV contrast. COMPARISON:  Abdominal radiographs, 04/21/2019, CT abdomen pelvis, 04/17/2018, 04/11/2019 FINDINGS: Lower chest: Unchanged small bilateral pleural effusions and associated atelectasis or consolidation. Hepatobiliary: No solid liver abnormality is seen. No gallstones, gallbladder wall thickening, or biliary dilatation. Pancreas: Unremarkable. No pancreatic ductal dilatation or surrounding inflammatory changes. Spleen: Normal in size without significant abnormality. Adrenals/Urinary Tract: Adrenal glands are unremarkable. Mild right hydronephrosis, slightly increased compared to prior examination. Foley catheter in the urinary bladder. Stomach/Bowel: Stomach is within normal limits. The colon remains diffusely gas and fluid-filled to the rectum, caliber of the colon slightly increased, measuring up to 9 cm in the transverse colon. Sigmoid diverticulosis. Vascular/Lymphatic: Stent of the infrarenal IVC. Unchanged bulky retroperitoneal lymphadenopathy, largest left retroperitoneal nodes measuring up to 3.5 x 2.8 cm (series 3, image 60). Reproductive: No mass or other significant abnormality. Other: Anasarca. Retroperitoneal hematomas in the left psoas (series 3, image 57) and in the retroperitoneal fat  posterior to the right psoas (series 3, image 66), not significantly changed. Musculoskeletal: No acute or significant osseous findings. IMPRESSION: 1. The colon remains diffusely gas and fluid-filled to the rectum, slightly increased in caliber compared to prior examination. Findings favor ileus. No  specific findings to suggest infectious or inflammatory colitis. 2. Retroperitoneal hematomas in the left psoas and retroperitoneal fat posterior to the right psoas are not significantly changed. 3. Unchanged bulky retroperitoneal lymphadenopathy. 4. Mild right hydronephrosis, slightly increased compared to prior examination, the mid to distal ureter presumably obstructed by adjacent lymph nodes. 5. Pleural effusions and anasarca. 6. Status post right orchiectomy. Electronically Signed   By: Eddie Candle M.D.   On: 04/22/2019 13:21   DG Abd 1 View  Result Date: 04/21/2019 CLINICAL DATA:  Ileus, history of IVC stent placement EXAM: ABDOMEN - 1 VIEW COMPARISON:  04/20/2019 FINDINGS: Supine frontal view of the abdomen and pelvis excludes the left flank and lower pelvis by collimation. There is progressive gaseous distension of large and small bowel. IVC stent unchanged. No masses or abnormal calcifications. IMPRESSION: Progressive gaseous distension of large and small bowel consistent with ileus. Electronically Signed   By: Randa Ngo M.D.   On: 04/21/2019 08:42   DG Abd 1 View  Result Date: 04/20/2019 CLINICAL DATA:  Diffuse abdominal tenderness, abdominal distension EXAM: ABDOMEN - 1 VIEW COMPARISON:  04/19/2019 FINDINGS: 2 supine frontal views of the abdomen and pelvis demonstrate persistent diffuse gaseous distention of the colon likely postoperative ileus. No masses or abnormal calcifications. Venous stent again noted. IMPRESSION: 1. Continued postoperative ileus. Electronically Signed   By: Randa Ngo M.D.   On: 04/20/2019 15:10   DG Abd 1 View  Result Date: 04/19/2019 CLINICAL DATA:  Evaluate ileus. EXAM: ABDOMEN - 1 VIEW COMPARISON:  April 19, 2019 (acquired at 8:04 a.m. FINDINGS: Numerous dilated air-filled loops of large bowel are again seen. This is unchanged in appearance when compared to the prior study. The visualized small bowel loops are normal in caliber. A radiopaque stent is seen  overlying the medial aspect of the mid right abdomen. No radio-opaque calculi or other significant radiographic abnormality are seen. IMPRESSION: 1. Stable findings likely consistent with a postoperative ileus, unchanged in appearance when compared to the prior study dated April 19, 2019 (acquired at 8:04 a.m.). Electronically Signed   By: Virgina Norfolk M.D.   On: 04/19/2019 20:28   DG Abd 1 View  Result Date: 04/19/2019 CLINICAL DATA:  Generalized abdominal pain, postoperative ileus. EXAM: ABDOMEN - 1 VIEW COMPARISON:  April 18, 2019. FINDINGS: Stable dilated air-filled colon is noted most consistent with postoperative ileus. No significant small bowel dilatation is noted. Phleboliths are noted in the pelvis. IVC stent is again noted. IMPRESSION: Stable dilated air-filled colon is noted most consistent with postoperative ileus. Electronically Signed   By: Marijo Conception M.D.   On: 04/19/2019 09:14   CT ANGIO CHEST PE W OR WO CONTRAST  Result Date: 04/19/2019 CLINICAL DATA:  Hypoxia. EXAM: CT ANGIOGRAPHY CHEST WITH CONTRAST TECHNIQUE: Multidetector CT imaging of the chest was performed using the standard protocol during bolus administration of intravenous contrast. Multiplanar CT image reconstructions and MIPs were obtained to evaluate the vascular anatomy. CONTRAST:  15m OMNIPAQUE IOHEXOL 300 MG/ML SOLN, 824mOMNIPAQUE IOHEXOL 350 MG/ML SOLN COMPARISON:  04/11/2019 FINDINGS: Cardiovascular: Evaluation for acute pulmonary emboli is severely limited by respiratory motion artifact and suboptimal contrast bolus timing. Given these limitations, no large centrally located pulmonary embolism was detected.Detection of  smaller pulmonary emboli is not possible on this exam. The main pulmonary artery is within normal limits for size. There is no CT evidence of acute right heart strain. The visualized aorta is normal. Heart size is normal, without pericardial effusion. Mediastinum/Nodes: --No mediastinal or  hilar lymphadenopathy. --No axillary lymphadenopathy. --there is an enlarged left supraclavicular lymph node measuring 1.4 cm (axial series 5, image 5). --Normal thyroid gland. --The esophagus is unremarkable Lungs/Pleura: There is atelectasis at the lung bases bilaterally. There are new small bilateral pleural effusions, left greater than right. Upper Abdomen: No acute abnormality. Musculoskeletal: No chest wall abnormality. No acute or significant osseous findings. Review of the MIP images confirms the above findings. IMPRESSION: 1. Evaluation for acute pulmonary emboli is severely limited by respiratory motion artifact and suboptimal contrast bolus timing. Given these limitations, no large centrally located pulmonary embolism was detected. 2. New small bilateral pleural effusions, left greater than right. 3. Enlarged left supraclavicular lymph node measuring 1.4 cm. This may be malignant or reactive in etiology. It is amenable to percutaneous biopsy as clinically indicated. Electronically Signed   By: Constance Holster M.D.   On: 04/19/2019 23:17   CT Angio Chest PE W and/or Wo Contrast  Result Date: 04/11/2019 CLINICAL DATA:  Positive lower extremity DVT, PE suspected. Back pain and leg swelling, history of testicular cancer. EXAM: CT ANGIOGRAPHY CHEST CT ABDOMEN AND PELVIS WITH CONTRAST TECHNIQUE: Multidetector CT imaging of the chest was performed using the standard protocol during bolus administration of intravenous contrast. Multiplanar CT image reconstructions and MIPs were obtained to evaluate the vascular anatomy. Multidetector CT imaging of the abdomen and pelvis was performed using the standard protocol during bolus administration of intravenous contrast. CONTRAST:  192m OMNIPAQUE IOHEXOL 350 MG/ML SOLN COMPARISON:  None. FINDINGS: CTA CHEST FINDINGS Cardiovascular: Suboptimal opacification of the pulmonary arteries limits evaluation beyond the lobar level. No large central or lobar pulmonary  arterial filling defects are identified. Central pulmonary arteries are normal caliber. Normal cardiac size. 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 The aorta is normal caliber. Shared origin of the brachiocephalic and left common carotid arteries. Great vessels are otherwise unremarkable. Mediastinum/Nodes: No enlarged mediastinal, hilar or axillary adenopathy is seen. No acute abnormality of the trachea or esophagus. Thyroid gland is normal. Asymmetric thickening of the left sternocleidomastoid is likely related to muscle activation. Lungs/Pleura: Respiratory motion artifact is mild. No consolidation, features of edema, pneumothorax, or effusion. No suspicious pulmonary nodules or masses. Musculoskeletal: No acute osseous abnormality or suspicious osseous lesion in the imaged chest. No worrisome chest wall lesions. Mild bilateral gynecomastia is noted, right slightly greater left. Review of the MIP images confirms the above findings. CT ABDOMEN and PELVIS FINDINGS Hepatobiliary: No focal liver abnormality is seen. No gallstones, gallbladder wall thickening, or biliary dilatation. Pancreas: Unremarkable. No pancreatic ductal dilatation or surrounding inflammatory changes. Spleen: Normal in size without focal abnormality. Adrenals/Urinary Tract: Adrenal glands are unremarkable. Kidneys are normal, without renal calculi, focal lesion, or hydronephrosis. Bladder is unremarkable. Stomach/Bowel: Distal esophagus, stomach and duodenal sweep are unremarkable. No small bowel wall thickening or dilatation. No evidence of obstruction. A normal appendix is visualized. No colonic dilatation or wall thickening. Scattered colonic diverticula without focal pericolonic inflammation to suggest diverticulitis. Vascular/Lymphatic: There is extensive retroperitoneal and pelvic adenopathy, some of the larger nodes include a left periaortic node measuring up to 3.2  cm (2/43) and a 2 cm  aortocaval lymph node (2/35) there is distal abdominal aortic encasement by a extensive retroperitoneal adenopathy. Furthermore there is likely direct compression and/or direct nodal involvement of the inferior vena cava (2/40 with the lower IVC appearing expanded by hypoattenuating filling defects. Edematous changes are noted throughout the pelvis with venous engorgement and collateralization. Reproductive: The prostate and seminal vesicles are unremarkable. There is a varicocele seen in the left scrotum. External genitalia are incompletely included on this exam. Other: There is circumferential body wall edema from the level of the pelvis inferiorly more pronounced throughout the left lower extremity. Free fluid is seen in the deep pelvis, likely reactive or secondary to venous occlusion with developing collateralization. Musculoskeletal: No acute osseous abnormality or suspicious osseous lesion. Normal symmetric muscular enhancement. Review of the MIP images confirms the above findings. IMPRESSION: 1. Suboptimal opacification of the pulmonary arteries limits evaluation beyond the lobar level. No large central or lobar pulmonary arterial filling defects are identified. 2. Extensive retroperitoneal and pelvic adenopathy. There is likely direct compression of the lower IVC with the distal IVC and iliac veins appearing expanded by hypoattenuating thrombus. There is complete encasement of the abdominal aorta as well but without luminal narrowing. Findings are highly concerning for metastatic disease, particularly given patient history of testicular cancer. 3. Left scrotal varicocele. External genitalia are incompletely included on this exam. Consider further evaluation scrotal ultrasound given extensive retroperitoneal adenopathy and patient's history of testicular cancer. 4. Free fluid in the deep pelvis is likely reactive or secondary to venous occlusion with developing collateralization. 5.  Colonic diverticulosis without evidence of diverticulitis. 6. Mild bilateral gynecomastia, right slightly greater left. 7. 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 Critical Value/emergent results were called by telephone at the time of interpretation on 04/11/2019 at 1:33 am to [place] , who verbally acknowledged these results. Electronically Signed   By: Lovena Le M.D.   On: 04/11/2019 01:33   MR BRAIN W WO CONTRAST  Result Date: 04/15/2019 CLINICAL DATA:  History of testicular cancer. Assess for metastatic disease. EXAM: MRI HEAD WITHOUT AND WITH CONTRAST TECHNIQUE: Multiplanar, multiecho pulse sequences of the brain and surrounding structures were obtained without and with intravenous contrast. CONTRAST:  3m GADAVIST GADOBUTROL 1 MMOL/ML IV SOLN COMPARISON:  None. FINDINGS: Brain: The brain has a normal appearance without evidence of malformation, atrophy, old or acute small or large vessel infarction, mass lesion, hemorrhage, hydrocephalus or extra-axial collection. After contrast administration, no abnormal enhancement occurs. Vascular: Major vessels at the base of the brain show flow. Venous sinuses appear patent. Skull and upper cervical spine: Normal. Sinuses/Orbits: Clear/normal. Other: None significant. IMPRESSION: Normal examination.  No evidence of metastatic disease. Electronically Signed   By: MNelson ChimesM.D.   On: 04/15/2019 11:07   UKoreaScrotum  Result Date: 04/11/2019 CLINICAL DATA:  Concern for testicular cancer EXAM: ULTRASOUND OF SCROTUM TECHNIQUE: Complete ultrasound examination of the testicles, epididymis, and other scrotal structures was performed. COMPARISON:  None. FINDINGS: Right testicle Measurements: Surgically removed. Left testicle Measurements: 3.9 x 2.2 x 2.6 cm. No mass or microlithiasis visualized. Right epididymis:  Not visualized Left epididymis:  Normal in size and appearance. Hydrocele:   Moderate left hydrocele Varicocele:  Moderate left varicocele. IMPRESSION: No left testicular abnormality.  Prior right orchiectomy. Moderate left hydrocele and varicocele. Electronically Signed   By: KRolm BaptiseM.D.   On: 04/11/2019 02:16   CT ABDOMEN PELVIS W  CONTRAST  Result Date: 04/18/2019 CLINICAL DATA:  Testicular carcinoma post RIGHT orchiectomy, LEFT lower extremity DVT, abdominal distension, recent CT with intra-abdominal and retroperitoneal lymphadenopathy concerning for metastatic cancer EXAM: CT ABDOMEN AND PELVIS WITH CONTRAST TECHNIQUE: Multidetector CT imaging of the abdomen and pelvis was performed using the standard protocol following bolus administration of intravenous contrast. Sagittal and coronal MPR images reconstructed from axial data set. CONTRAST:  126m OMNIPAQUE IOHEXOL 300 MG/ML SOLN IV. No oral contrast. COMPARISON:  04/11/2019 FINDINGS: Lower chest: Dependent bibasilar atelectasis in the lower lobes Hepatobiliary: New dependent density within gallbladder question vicarious excretion of contrast material from prior CT exam versus sludge. No gallbladder wall thickening. Liver unremarkable. Pancreas: Normal appearance Spleen: Normal appearance Adrenals/Urinary Tract: Adrenal glands, kidneys, and ureters normal appearance. Foley catheter and air within urinary bladder. Stomach/Bowel: Retrocecal appendix, normal. Gaseous distention of colon from cecum to descending colon. Small amount of stool in fluid within proximal colon. Tapering of the distal colon with few scattered distal colonic diverticula but no evidence of diverticulitis. Rectosigmoid colon decompressed. No discrete point of obstruction seen; findings favor ileus. Stomach and small bowel loops unremarkable Vascular/Lymphatic: Prior stenting of the IVC. Aorta normal caliber without aneurysm. IVC, iliac veins, common femoral veins patent without thrombus. Previously identified thrombus LEFT external iliac vein no longer seen.  Again identified significant retroperitoneal adenopathy unchanged. Reproductive: Seminal vesicles unremarkable. Minimal prostatic prominence for age. Other: Scattered stranding of retroperitoneal tissue planes increased from previous exam. No free air or free fluid. Tiny LEFT inguinal hernia containing fat Musculoskeletal: Osseous structures unremarkable. New enlargement of the LEFT psoas muscle with in ill-defined area of central low attenuation approximately 3.7 x 3.9 cm in axial dimensions extending 12 cm length, could represent LEFT psoas abscess or hematoma. This is new since 04/11/2019 making tumor infiltration unlikely. IMPRESSION: Mild gaseous distention of the colon through descending colon with decompressed rectosigmoid colon; no point of obstruction is identified, favor ileus. Persistent retroperitoneal adenopathy suspicious for metastatic disease. New ill-defined fluid collection within LEFT psoas muscle 3.7 x 3.9 x 12 cm question psoas abscess versus hematoma; LEFT psoas muscle was traversed by prior CT guided retroperitoneal lymph node biopsy. Increased retroperitoneal and mesenteric stranding. Bibasilar atelectasis. Findings called to Dr. MZigmund Danielon 04/18/2019 at 1108 hours. Electronically Signed   By: MLavonia DanaM.D.   On: 04/18/2019 11:08   CT ABDOMEN PELVIS W CONTRAST  Result Date: 04/11/2019 CLINICAL DATA:  Positive lower extremity DVT, PE suspected. Back pain and leg swelling, history of testicular cancer. EXAM: CT ANGIOGRAPHY CHEST CT ABDOMEN AND PELVIS WITH CONTRAST TECHNIQUE: Multidetector CT imaging of the chest was performed using the standard protocol during bolus administration of intravenous contrast. Multiplanar CT image reconstructions and MIPs were obtained to evaluate the vascular anatomy. Multidetector CT imaging of the abdomen and pelvis was performed using the standard protocol during bolus administration of intravenous contrast. CONTRAST:  1021mOMNIPAQUE IOHEXOL 350 MG/ML  SOLN COMPARISON:  None. FINDINGS: CTA CHEST FINDINGS Cardiovascular: Suboptimal opacification of the pulmonary arteries limits evaluation beyond the lobar level. No large central or lobar pulmonary arterial filling defects are identified. Central pulmonary arteries are normal caliber. Normal cardiac size. 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 The aorta is normal caliber. Shared origin of the brachiocephalic and left common carotid arteries. Great vessels are otherwise unremarkable. Mediastinum/Nodes: No enlarged mediastinal, hilar or axillary adenopathy is seen. No acute abnormality of the  trachea or esophagus. Thyroid gland is normal. Asymmetric thickening of the left sternocleidomastoid is likely related to muscle activation. Lungs/Pleura: Respiratory motion artifact is mild. No consolidation, features of edema, pneumothorax, or effusion. No suspicious pulmonary nodules or masses. Musculoskeletal: No acute osseous abnormality or suspicious osseous lesion in the imaged chest. No worrisome chest wall lesions. Mild bilateral gynecomastia is noted, right slightly greater left. Review of the MIP images confirms the above findings. CT ABDOMEN and PELVIS FINDINGS Hepatobiliary: No focal liver abnormality is seen. No gallstones, gallbladder wall thickening, or biliary dilatation. Pancreas: Unremarkable. No pancreatic ductal dilatation or surrounding inflammatory changes. Spleen: Normal in size without focal abnormality. Adrenals/Urinary Tract: Adrenal glands are unremarkable. Kidneys are normal, without renal calculi, focal lesion, or hydronephrosis. Bladder is unremarkable. Stomach/Bowel: Distal esophagus, stomach and duodenal sweep are unremarkable. No small bowel wall thickening or dilatation. No evidence of obstruction. A normal appendix is visualized. No colonic dilatation or wall thickening. Scattered colonic diverticula without focal  pericolonic inflammation to suggest diverticulitis. Vascular/Lymphatic: There is extensive retroperitoneal and pelvic adenopathy, some of the larger nodes include a left periaortic node measuring up to 3.2 cm (2/43) and a 2 cm aortocaval lymph node (2/35) there is distal abdominal aortic encasement by a extensive retroperitoneal adenopathy. Furthermore there is likely direct compression and/or direct nodal involvement of the inferior vena cava (2/40 with the lower IVC appearing expanded by hypoattenuating filling defects. Edematous changes are noted throughout the pelvis with venous engorgement and collateralization. Reproductive: The prostate and seminal vesicles are unremarkable. There is a varicocele seen in the left scrotum. External genitalia are incompletely included on this exam. Other: There is circumferential body wall edema from the level of the pelvis inferiorly more pronounced throughout the left lower extremity. Free fluid is seen in the deep pelvis, likely reactive or secondary to venous occlusion with developing collateralization. Musculoskeletal: No acute osseous abnormality or suspicious osseous lesion. Normal symmetric muscular enhancement. Review of the MIP images confirms the above findings. IMPRESSION: 1. Suboptimal opacification of the pulmonary arteries limits evaluation beyond the lobar level. No large central or lobar pulmonary arterial filling defects are identified. 2. Extensive retroperitoneal and pelvic adenopathy. There is likely direct compression of the lower IVC with the distal IVC and iliac veins appearing expanded by hypoattenuating thrombus. There is complete encasement of the abdominal aorta as well but without luminal narrowing. Findings are highly concerning for metastatic disease, particularly given patient history of testicular cancer. 3. Left scrotal varicocele. External genitalia are incompletely included on this exam. Consider further evaluation scrotal ultrasound given  extensive retroperitoneal adenopathy and patient's history of testicular cancer. 4. Free fluid in the deep pelvis is likely reactive or secondary to venous occlusion with developing collateralization. 5. Colonic diverticulosis without evidence of diverticulitis. 6. Mild bilateral gynecomastia, right slightly greater left. 7. 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 Critical Value/emergent results were called by telephone at the time of interpretation on 04/11/2019 at 1:33 am to [place] , who verbally acknowledged these results. Electronically Signed   By: Lovena Le M.D.   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 Patient has experienced minimal improvement since initiation of IV heparin, and as such request made for initiation of catheter directed thrombolysis  for symptomatic purposes. EXAM: 1. ULTRASOUND GUIDANCE FOR VASCULAR ACCESS X2 2. FLUOROSCOPIC GUIDED PHARMACOLOGICAL AND MECHANICAL THROMBECTOMY WITH ANGIOJET DEVICE 3. BILATERAL LOWER EXTREMITY VENOGRAM AND PLACEMENT OF INFUSION CATHETERS COMPARISON:  CT chest, abdomen pelvis-04/11/2019; abdominal MRV - 04/13/2019 MEDICATIONS: Hydralazine 10 mg IV, administered near the completion of the procedure secondary to persistently elevated blood pressure. CONTRAST:  60 cc Omnipaque 300 ANESTHESIA/SEDATION: Moderate (conscious) sedation was employed during this procedure. A total of Versed 3 mg and Fentanyl 125 mcg was administered intravenously. Moderate Sedation Time: 67 minutes. The patient's level of consciousness and vital signs were monitored continuously by radiology nursing throughout the procedure under my direct supervision. FLUOROSCOPY TIME:  18  minutes, 18 seconds (859 mGy) COMPLICATIONS: None immediate. TECHNIQUE: Informed written consent was obtained from the patient after a discussion of the risks (including but not limited to bleeding, including nontarget bleeding given history of malignancy, infection, pulmonary embolism and necessity for venous angioplasty and/or stent placement), benefits and alternatives to treatment. Questions regarding the procedure were encouraged and answered. A timeout was performed prior to the initiation of the procedure. The patient was placed prone on the fluoroscopy table and the skin posterior to the bilateral knees was prepped and draped in the usual sterile fashion, and a sterile drape was applied covering the operative field. Maximum barrier sterile technique with sterile gowns and gloves were used for the procedure. Under direct ultrasound guidance, the left popliteal vein was access with a micropuncture kit after the overlying soft tissues were anesthetized with 1% lidocaine. An ultrasound image was saved for documentation purposed. This allowed for placement of a 8-French vascular sheath. A limited venogram was performed through the side arm of the vascular sheath. With the use of a regular glidewire, a Kumpe catheter was advanced through the left femoral, external iliac vein and through the left common iliac vein to the level of the IVC. Limited vena cavogram demonstrates complete occlusion of the distal aspect of the IVC extending to involve both left common iliac veins. Ultimately, a moderate to long segment apparently extrinsic malignant narrowing involving the mid/distal aspect of the IVC was successfully traversed with inferior venogram demonstrating patency of the more cranial infrarenal IVC. Next, with the use of a Angiojet device, approximately 250 cc of a solution of 10 mg of tPA mixed in 500 mg of saline was infused throughout the occluded segment of the IVC, left common and external iliac veins to the  level of the left common femoral vein. Attention was now paid towards access of the right lower extremity popliteal vein. After the overlying soft tissues were anesthetized with 1% lidocaine, the right popliteal vein was accessed micropuncture kit. Limited venogram was performed through the outer micro puncture sheath. This allowed for placement of a 6 French, 35 cm vascular sheath. Again, with the use of a regular glidewire, a Kumpe catheter was advanced beyond the obstructing caval lesion. Contrast injection confirmed appropriate positioning. Next, after approximately 20 minute tPA dwell, mechanical thrombectomy was performed with the Angiojet device both from the left lower extremity access as well as the right lower extremity access. Limited vena cavagram was performed via the right lower extremity access. Kumpe catheter was utilized for measurement purposes and ultimately a 90 cm/20-cm infusion catheter was placed from the right lower extremity access and a 90 cm/30 cm infusion catheter was placed from the left lower extremity access adequately covering the occluded segment of the bilateral venous systems as well as the caudal aspect of the IVC.  Postprocedural spot fluoroscopic images were obtained. Both popliteal vascular sheath were secured in place. Dressings were applied. Patient's was subjectively cold by the end of the procedure with associated elevation in his blood pressure and as such he was administered 10 mg of hydralazine and bear hugger warming device was applied. The patient otherwise tolerated the procedure well without immediate postprocedural complication. FINDINGS: Sonographic evaluation demonstrates patency of the bilateral popliteal veins. Contrast injection demonstrates patency of the bilateral popliteal and superficial femoral veins. There is occlusive thrombus extending from the left common femoral vein and the right common iliac vein through the mid/caudal aspect of the IVC secondary to  a moderate to long segment length presumably extrinsic malignant occlusion secondary to known adjacent bulky retroperitoneal lymphadenopathy. Following pharmacological and mechanical thrombectomy as detailed above, there is restored flow as demonstrated on limited right central pelvic venogram with large amount of residual thrombus. Following infusion catheter placement, both side ports traverse both occluded segments of the IVC and pelvic venous systems. IMPRESSION: 1. 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 2. Successful initiation of bilateral mechanical and pharmacologic thrombectomy with bilateral infusion catheters appropriately positioned. PLAN: The patient will be transferred to ICU for overnight lytic infusion and will return tomorrow for repeat venogram and potential intervention. Electronically Signed   By: Sandi Mariscal M.D.   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 Patient has experienced minimal improvement since initiation of IV heparin, and as such request made for initiation of catheter directed thrombolysis for symptomatic purposes. EXAM: 1. ULTRASOUND GUIDANCE FOR VASCULAR ACCESS X2 2. FLUOROSCOPIC GUIDED PHARMACOLOGICAL AND MECHANICAL THROMBECTOMY WITH ANGIOJET DEVICE 3. BILATERAL LOWER EXTREMITY VENOGRAM AND PLACEMENT OF INFUSION CATHETERS COMPARISON:  CT chest, abdomen pelvis-04/11/2019; abdominal MRV - 04/13/2019 MEDICATIONS: Hydralazine 10 mg IV, administered near the completion of the procedure secondary to persistently elevated blood pressure. CONTRAST:  60 cc Omnipaque 300 ANESTHESIA/SEDATION: Moderate (conscious) sedation was employed  during this procedure. A total of Versed 3 mg and Fentanyl 125 mcg was administered intravenously. Moderate Sedation Time: 67 minutes. The patient's level of consciousness and vital signs were monitored continuously by radiology nursing throughout the procedure under my direct supervision. FLUOROSCOPY TIME:  18 minutes, 18 seconds (494 mGy) COMPLICATIONS: None immediate. TECHNIQUE: Informed written consent was obtained from the patient after a discussion of the risks (including but not limited to bleeding, including nontarget bleeding given history of malignancy, infection, pulmonary embolism and necessity for venous angioplasty and/or stent placement), benefits and alternatives to treatment. Questions regarding the procedure were encouraged and answered. A timeout was performed prior to the initiation of the procedure. The patient was placed prone on the fluoroscopy table and the skin posterior to the bilateral knees was prepped and draped in the usual sterile fashion, and a sterile drape was applied covering the operative field. Maximum barrier sterile technique with sterile gowns and gloves were used for the procedure. Under direct ultrasound guidance, the left popliteal vein was access with a micropuncture kit after the overlying soft tissues were anesthetized with 1% lidocaine. An ultrasound image was saved for documentation purposed. This allowed for placement of a 8-French vascular sheath. A limited venogram was performed through the side arm of the vascular sheath.  With the use of a regular glidewire, a Kumpe catheter was advanced through the left femoral, external iliac vein and through the left common iliac vein to the level of the IVC. Limited vena cavogram demonstrates complete occlusion of the distal aspect of the IVC extending to involve both left common iliac veins. Ultimately, a moderate to long segment apparently extrinsic malignant narrowing involving the mid/distal aspect of the IVC was  successfully traversed with inferior venogram demonstrating patency of the more cranial infrarenal IVC. Next, with the use of a Angiojet device, approximately 250 cc of a solution of 10 mg of tPA mixed in 500 mg of saline was infused throughout the occluded segment of the IVC, left common and external iliac veins to the level of the left common femoral vein. Attention was now paid towards access of the right lower extremity popliteal vein. After the overlying soft tissues were anesthetized with 1% lidocaine, the right popliteal vein was accessed micropuncture kit. Limited venogram was performed through the outer micro puncture sheath. This allowed for placement of a 6 French, 35 cm vascular sheath. Again, with the use of a regular glidewire, a Kumpe catheter was advanced beyond the obstructing caval lesion. Contrast injection confirmed appropriate positioning. Next, after approximately 20 minute tPA dwell, mechanical thrombectomy was performed with the Angiojet device both from the left lower extremity access as well as the right lower extremity access. Limited vena cavagram was performed via the right lower extremity access. Kumpe catheter was utilized for measurement purposes and ultimately a 90 cm/20-cm infusion catheter was placed from the right lower extremity access and a 90 cm/30 cm infusion catheter was placed from the left lower extremity access adequately covering the occluded segment of the bilateral venous systems as well as the caudal aspect of the IVC. Postprocedural spot fluoroscopic images were obtained. Both popliteal vascular sheath were secured in place. Dressings were applied. Patient's was subjectively cold by the end of the procedure with associated elevation in his blood pressure and as such he was administered 10 mg of hydralazine and bear hugger warming device was applied. The patient otherwise tolerated the procedure well without immediate postprocedural complication. FINDINGS: Sonographic  evaluation demonstrates patency of the bilateral popliteal veins. Contrast injection demonstrates patency of the bilateral popliteal and superficial femoral veins. There is occlusive thrombus extending from the left common femoral vein and the right common iliac vein through the mid/caudal aspect of the IVC secondary to a moderate to long segment length presumably extrinsic malignant occlusion secondary to known adjacent bulky retroperitoneal lymphadenopathy. Following pharmacological and mechanical thrombectomy as detailed above, there is restored flow as demonstrated on limited right central pelvic venogram with large amount of residual thrombus. Following infusion catheter placement, both side ports traverse both occluded segments of the IVC and pelvic venous systems. IMPRESSION: 1. 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 2. Successful initiation of bilateral mechanical and pharmacologic thrombectomy with bilateral infusion catheters appropriately positioned. PLAN: The patient will be transferred to ICU for overnight lytic infusion and will return tomorrow for repeat venogram and potential intervention. Electronically Signed   By: Sandi Mariscal M.D.   On: 04/15/2019 17:04   US RENAL  Result Date: 04/22/2019 CLINICAL DATA:  Elevated serum creatinine level. EXAM: RENAL / URINARY TRACT ULTRASOUND COMPLETE COMPARISON:  April 17, 2019. FINDINGS: Right Kidney: Renal measurements: 13.9 x 6.4 x 6.3 cm = volume: 294 mL .  Echogenicity within normal limits. No mass or hydronephrosis visualized. Left Kidney: Renal measurements: 12.2 x 6.3 x 6.2 cm = volume: 246 mL. Echogenicity within normal limits. No mass or hydronephrosis visualized. Bladder: Appears normal for degree of bladder distention. Foley catheter is noted. Other: None. IMPRESSION: No significant renal abnormality is noted. Electronically  Signed   By: Marijo Conception M.D.   On: 04/22/2019 16:53   US RENAL  Result Date: 04/17/2019 CLINICAL DATA:  Urinary retention.  History of testicular carcinoma. EXAM: RENAL / URINARY TRACT ULTRASOUND COMPLETE COMPARISON:  CT scan 04/11/2019 FINDINGS: Right Kidney: Renal measurements: 13.5 x 5.5 x 6.6 cm = volume: 260.0 mL . Normal renal cortical thickness and echogenicity without focal lesions or hydronephrosis. Left Kidney: Renal measurements: 12.0 x 6.8 x 5.4 cm = volume: 229.4 mL. Normal renal cortical thickness and echogenicity without focal lesions or hydronephrosis. Bladder: Decompressed by Foley catheter. Other: None. IMPRESSION: Normal sonographic appearance of both kidneys.  No hydronephrosis. Bladder decompressed by a Foley catheter. Electronically Signed   By: Marijo Sanes M.D.   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  Patient has experienced minimal improvement since initiation of IV heparin, and as such request made for initiation of catheter directed thrombolysis for symptomatic purposes.  EXAM: 1. ULTRASOUND GUIDANCE FOR VASCULAR ACCESS X2 2. FLUOROSCOPIC GUIDED PHARMACOLOGICAL AND MECHANICAL THROMBECTOMY WITH ANGIOJET DEVICE 3. BILATERAL LOWER EXTREMITY VENOGRAM AND PLACEMENT OF INFUSION CATHETERS  COMPARISON:  CT chest, abdomen pelvis-04/11/2019; abdominal MRV - 04/13/2019  MEDICATIONS: Hydralazine 10 mg IV, administered near the completion of the procedure secondary to persistently elevated blood pressure.  CONTRAST:  60 cc Omnipaque 300  ANESTHESIA/SEDATION: Moderate (conscious) sedation was employed during this procedure. A total of Versed 3 mg and Fentanyl 125 mcg was administered intravenously.  Moderate Sedation Time: 67 minutes. The  patient's level of consciousness and vital signs were monitored continuously by radiology nursing throughout the procedure under my direct supervision.  FLUOROSCOPY TIME:  18 minutes, 18 seconds (170 mGy)  COMPLICATIONS: None immediate.  TECHNIQUE: Informed written consent was obtained from the patient after a discussion of the risks (including but not limited to bleeding, including nontarget bleeding given history of malignancy, infection, pulmonary embolism and necessity for venous angioplasty and/or stent placement), benefits and alternatives to treatment. Questions regarding the procedure were encouraged and answered. A timeout was performed prior to the initiation of the procedure.  The patient was placed prone on the fluoroscopy table and the skin posterior to the bilateral knees was prepped and draped in the usual sterile fashion, and a sterile drape was applied covering the operative field. Maximum barrier sterile technique with sterile gowns and gloves were used for the procedure.  Under direct ultrasound guidance, the left popliteal vein was access with a micropuncture kit after the overlying soft tissues were anesthetized with 1% lidocaine. An ultrasound image was saved for documentation purposed. This allowed for placement of a 8-French vascular sheath. A limited venogram was performed through the side arm of the vascular sheath.  With the use of a regular glidewire, a Kumpe catheter was advanced through the left femoral, external iliac vein and through the left common iliac vein to the level of the IVC.  Limited vena cavogram demonstrates complete occlusion of the distal aspect of the IVC  extending to involve both left common iliac veins.  Ultimately, a moderate to long segment apparently extrinsic malignant narrowing involving the mid/distal aspect of the IVC was successfully traversed with inferior venogram demonstrating patency of the more cranial infrarenal IVC.  Next, with the use of a  Angiojet device, approximately 250 cc of a solution of 10 mg of tPA mixed in 500 mg of saline was infused throughout the occluded segment of the IVC, left common and external iliac veins to the level of the left common femoral vein.  Attention was now paid towards access of the right lower extremity popliteal vein.  After the overlying soft tissues were anesthetized with 1% lidocaine, the right popliteal vein was accessed micropuncture kit. Limited venogram was performed through the outer micro puncture sheath. This allowed for placement of a 6 French, 35 cm vascular sheath.  Again, with the use of a regular glidewire, a Kumpe catheter was advanced beyond the obstructing caval lesion. Contrast injection confirmed appropriate positioning.  Next, after approximately 20 minute tPA dwell, mechanical thrombectomy was performed with the Angiojet device both from the left lower extremity access as well as the right lower extremity access.  Limited vena cavagram was performed via the right lower extremity access.  Kumpe catheter was utilized for measurement purposes and ultimately a 90 cm/20-cm infusion catheter was placed from the right lower extremity access and a 90 cm/30 cm infusion catheter was placed from the left lower extremity access adequately covering the occluded segment of the bilateral venous systems as well as the caudal aspect of the IVC.  Postprocedural spot fluoroscopic images were obtained.  Both popliteal vascular sheath were secured in place. Dressings were applied.  Patient's was subjectively cold by the end of the procedure with associated elevation in his blood pressure and as such he was administered 10 mg of hydralazine and bear hugger warming device was applied.  The patient otherwise tolerated the procedure well without immediate postprocedural complication.  FINDINGS: Sonographic evaluation demonstrates patency of the bilateral popliteal veins.  Contrast injection demonstrates  patency of the bilateral popliteal and superficial femoral veins.  There is occlusive thrombus extending from the left common femoral vein and the right common iliac vein through the mid/caudal aspect of the IVC secondary to a moderate to long segment length presumably extrinsic malignant occlusion secondary to known adjacent bulky retroperitoneal lymphadenopathy.  Following pharmacological and mechanical thrombectomy as detailed above, there is restored flow as demonstrated on limited right central pelvic venogram with large amount of residual thrombus.  Following infusion catheter placement, both side ports traverse both occluded segments of the IVC and pelvic venous systems.  IMPRESSION: 1. 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 2. Successful initiation of bilateral mechanical and pharmacologic thrombectomy with bilateral infusion catheters appropriately positioned.  PLAN: The patient will be transferred to ICU for overnight lytic infusion and will return tomorrow for repeat venogram and potential intervention.   Electronically Signed   By: Sandi Mariscal M.D.   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 Patient has experienced minimal improvement since initiation of IV heparin, and as such request made for initiation of catheter directed thrombolysis for symptomatic purposes. EXAM: 1. ULTRASOUND GUIDANCE  FOR VASCULAR ACCESS X2 2. FLUOROSCOPIC GUIDED PHARMACOLOGICAL AND MECHANICAL THROMBECTOMY WITH ANGIOJET DEVICE 3. BILATERAL LOWER EXTREMITY VENOGRAM AND PLACEMENT OF INFUSION CATHETERS COMPARISON:  CT chest, abdomen pelvis-04/11/2019;  abdominal MRV - 04/13/2019 MEDICATIONS: Hydralazine 10 mg IV, administered near the completion of the procedure secondary to persistently elevated blood pressure. CONTRAST:  60 cc Omnipaque 300 ANESTHESIA/SEDATION: Moderate (conscious) sedation was employed during this procedure. A total of Versed 3 mg and Fentanyl 125 mcg was administered intravenously. Moderate Sedation Time: 67 minutes. The patient's level of consciousness and vital signs were monitored continuously by radiology nursing throughout the procedure under my direct supervision. FLUOROSCOPY TIME:  18 minutes, 18 seconds (831 mGy) COMPLICATIONS: None immediate. TECHNIQUE: Informed written consent was obtained from the patient after a discussion of the risks (including but not limited to bleeding, including nontarget bleeding given history of malignancy, infection, pulmonary embolism and necessity for venous angioplasty and/or stent placement), benefits and alternatives to treatment. Questions regarding the procedure were encouraged and answered. A timeout was performed prior to the initiation of the procedure. The patient was placed prone on the fluoroscopy table and the skin posterior to the bilateral knees was prepped and draped in the usual sterile fashion, and a sterile drape was applied covering the operative field. Maximum barrier sterile technique with sterile gowns and gloves were used for the procedure. Under direct ultrasound guidance, the left popliteal vein was access with a micropuncture kit after the overlying soft tissues were anesthetized with 1% lidocaine. An ultrasound image was saved for documentation purposed. This allowed for placement of a 8-French vascular sheath. A limited venogram was performed through the side arm of the vascular sheath. With the use of a regular glidewire, a Kumpe catheter was advanced through the left femoral, external iliac vein and through the left common iliac vein to the level of the IVC. Limited vena  cavogram demonstrates complete occlusion of the distal aspect of the IVC extending to involve both left common iliac veins. Ultimately, a moderate to long segment apparently extrinsic malignant narrowing involving the mid/distal aspect of the IVC was successfully traversed with inferior venogram demonstrating patency of the more cranial infrarenal IVC. Next, with the use of a Angiojet device, approximately 250 cc of a solution of 10 mg of tPA mixed in 500 mg of saline was infused throughout the occluded segment of the IVC, left common and external iliac veins to the level of the left common femoral vein. Attention was now paid towards access of the right lower extremity popliteal vein. After the overlying soft tissues were anesthetized with 1% lidocaine, the right popliteal vein was accessed micropuncture kit. Limited venogram was performed through the outer micro puncture sheath. This allowed for placement of a 6 French, 35 cm vascular sheath. Again, with the use of a regular glidewire, a Kumpe catheter was advanced beyond the obstructing caval lesion. Contrast injection confirmed appropriate positioning. Next, after approximately 20 minute tPA dwell, mechanical thrombectomy was performed with the Angiojet device both from the left lower extremity access as well as the right lower extremity access. Limited vena cavagram was performed via the right lower extremity access. Kumpe catheter was utilized for measurement purposes and ultimately a 90 cm/20-cm infusion catheter was placed from the right lower extremity access and a 90 cm/30 cm infusion catheter was placed from the left lower extremity access adequately covering the occluded segment of the bilateral venous systems as well as the caudal aspect of the IVC. Postprocedural spot fluoroscopic images were obtained. Both  popliteal vascular sheath were secured in place. Dressings were applied. Patient's was subjectively cold by the end of the procedure with  associated elevation in his blood pressure and as such he was administered 10 mg of hydralazine and bear hugger warming device was applied. The patient otherwise tolerated the procedure well without immediate postprocedural complication. FINDINGS: Sonographic evaluation demonstrates patency of the bilateral popliteal veins. Contrast injection demonstrates patency of the bilateral popliteal and superficial femoral veins. There is occlusive thrombus extending from the left common femoral vein and the right common iliac vein through the mid/caudal aspect of the IVC secondary to a moderate to long segment length presumably extrinsic malignant occlusion secondary to known adjacent bulky retroperitoneal lymphadenopathy. Following pharmacological and mechanical thrombectomy as detailed above, there is restored flow as demonstrated on limited right central pelvic venogram with large amount of residual thrombus. Following infusion catheter placement, both side ports traverse both occluded segments of the IVC and pelvic venous systems. IMPRESSION: 1. 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 2. Successful initiation of bilateral mechanical and pharmacologic thrombectomy with bilateral infusion catheters appropriately positioned. PLAN: The patient will be transferred to ICU for overnight lytic infusion and will return tomorrow for repeat venogram and potential intervention. Electronically Signed   By: Sandi Mariscal M.D.   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 Patient has experienced minimal improvement since initiation of IV heparin, and as such request made for  initiation of catheter directed thrombolysis for symptomatic purposes. EXAM: 1. ULTRASOUND GUIDANCE FOR VASCULAR ACCESS X2 2. FLUOROSCOPIC GUIDED PHARMACOLOGICAL AND MECHANICAL THROMBECTOMY WITH ANGIOJET DEVICE 3. BILATERAL LOWER EXTREMITY VENOGRAM AND PLACEMENT OF INFUSION CATHETERS COMPARISON:  CT chest, abdomen pelvis-04/11/2019; abdominal MRV - 04/13/2019 MEDICATIONS: Hydralazine 10 mg IV, administered near the completion of the procedure secondary to persistently elevated blood pressure. CONTRAST:  60 cc Omnipaque 300 ANESTHESIA/SEDATION: Moderate (conscious) sedation was employed during this procedure. A total of Versed 3 mg and Fentanyl 125 mcg was administered intravenously. Moderate Sedation Time: 67 minutes. The patient's level of consciousness and vital signs were monitored continuously by radiology nursing throughout the procedure under my direct supervision. FLUOROSCOPY TIME:  18 minutes, 18 seconds (149 mGy) COMPLICATIONS: None immediate. TECHNIQUE: Informed written consent was obtained from the patient after a discussion of the risks (including but not limited to bleeding, including nontarget bleeding given history of malignancy, infection, pulmonary embolism and necessity for venous angioplasty and/or stent placement), benefits and alternatives to treatment. Questions regarding the procedure were encouraged and answered. A timeout was performed prior to the initiation of the procedure. The patient was placed prone on the fluoroscopy table and the skin posterior to the bilateral knees was prepped and draped in the usual sterile fashion, and a sterile drape was applied covering the operative field. Maximum barrier sterile technique with sterile gowns and gloves were used for the procedure. Under direct ultrasound guidance, the left popliteal vein was access with a micropuncture kit after the overlying soft tissues were anesthetized with 1% lidocaine. An ultrasound image was saved for documentation  purposed. This allowed for placement of a 8-French vascular sheath. A limited venogram was performed through the side arm of the vascular sheath. With the use  of a regular glidewire, a Kumpe catheter was advanced through the left femoral, external iliac vein and through the left common iliac vein to the level of the IVC. Limited vena cavogram demonstrates complete occlusion of the distal aspect of the IVC extending to involve both left common iliac veins. Ultimately, a moderate to long segment apparently extrinsic malignant narrowing involving the mid/distal aspect of the IVC was successfully traversed with inferior venogram demonstrating patency of the more cranial infrarenal IVC. Next, with the use of a Angiojet device, approximately 250 cc of a solution of 10 mg of tPA mixed in 500 mg of saline was infused throughout the occluded segment of the IVC, left common and external iliac veins to the level of the left common femoral vein. Attention was now paid towards access of the right lower extremity popliteal vein. After the overlying soft tissues were anesthetized with 1% lidocaine, the right popliteal vein was accessed micropuncture kit. Limited venogram was performed through the outer micro puncture sheath. This allowed for placement of a 6 French, 35 cm vascular sheath. Again, with the use of a regular glidewire, a Kumpe catheter was advanced beyond the obstructing caval lesion. Contrast injection confirmed appropriate positioning. Next, after approximately 20 minute tPA dwell, mechanical thrombectomy was performed with the Angiojet device both from the left lower extremity access as well as the right lower extremity access. Limited vena cavagram was performed via the right lower extremity access. Kumpe catheter was utilized for measurement purposes and ultimately a 90 cm/20-cm infusion catheter was placed from the right lower extremity access and a 90 cm/30 cm infusion catheter was placed from the left lower  extremity access adequately covering the occluded segment of the bilateral venous systems as well as the caudal aspect of the IVC. Postprocedural spot fluoroscopic images were obtained. Both popliteal vascular sheath were secured in place. Dressings were applied. Patient's was subjectively cold by the end of the procedure with associated elevation in his blood pressure and as such he was administered 10 mg of hydralazine and bear hugger warming device was applied. The patient otherwise tolerated the procedure well without immediate postprocedural complication. FINDINGS: Sonographic evaluation demonstrates patency of the bilateral popliteal veins. Contrast injection demonstrates patency of the bilateral popliteal and superficial femoral veins. There is occlusive thrombus extending from the left common femoral vein and the right common iliac vein through the mid/caudal aspect of the IVC secondary to a moderate to long segment length presumably extrinsic malignant occlusion secondary to known adjacent bulky retroperitoneal lymphadenopathy. Following pharmacological and mechanical thrombectomy as detailed above, there is restored flow as demonstrated on limited right central pelvic venogram with large amount of residual thrombus. Following infusion catheter placement, both side ports traverse both occluded segments of the IVC and pelvic venous systems. IMPRESSION: 1. 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 2. Successful initiation of bilateral mechanical and pharmacologic thrombectomy with bilateral infusion catheters appropriately positioned. PLAN: The patient will be transferred to ICU for overnight lytic infusion and will return tomorrow for repeat venogram and potential intervention. Electronically Signed   By: Sandi Mariscal M.D.   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 Patient has experienced minimal improvement since initiation  of IV heparin, and as such request made for initiation of catheter directed thrombolysis for symptomatic purposes. EXAM: 1. ULTRASOUND GUIDANCE FOR VASCULAR ACCESS X2 2. FLUOROSCOPIC GUIDED PHARMACOLOGICAL AND MECHANICAL THROMBECTOMY WITH ANGIOJET DEVICE 3. BILATERAL LOWER EXTREMITY VENOGRAM AND PLACEMENT OF INFUSION CATHETERS COMPARISON:  CT chest, abdomen pelvis-04/11/2019; abdominal MRV - 04/13/2019 MEDICATIONS: Hydralazine 10 mg IV, administered near the completion of the procedure secondary to persistently elevated blood pressure. CONTRAST:  60 cc Omnipaque 300 ANESTHESIA/SEDATION: Moderate (conscious) sedation was employed during this procedure. A total of Versed 3 mg and Fentanyl 125 mcg was administered intravenously. Moderate Sedation Time: 67 minutes. The patient's level of consciousness and vital signs were monitored continuously by radiology nursing throughout the procedure under my direct supervision. FLUOROSCOPY TIME:  18 minutes, 18 seconds (478 mGy) COMPLICATIONS: None immediate. TECHNIQUE: Informed written consent was obtained from the patient after a discussion of the risks (including but not limited to bleeding, including nontarget bleeding given history of malignancy, infection, pulmonary embolism and necessity for venous angioplasty and/or stent placement), benefits and alternatives to treatment. Questions regarding the procedure were encouraged and answered. A timeout was performed prior to the initiation of the procedure. The patient was placed prone on the fluoroscopy table and the skin posterior to the bilateral knees was prepped and draped in the usual sterile fashion, and a sterile drape was applied covering the  operative field. Maximum barrier sterile technique with sterile gowns and gloves were used for the procedure. Under direct ultrasound guidance, the left popliteal vein was access with a micropuncture kit after the overlying soft tissues were anesthetized with 1% lidocaine. An ultrasound image was saved for documentation purposed. This allowed for placement of a 8-French vascular sheath. A limited venogram was performed through the side arm of the vascular sheath. With the use of a regular glidewire, a Kumpe catheter was advanced through the left femoral, external iliac vein and through the left common iliac vein to the level of the IVC. Limited vena cavogram demonstrates complete occlusion of the distal aspect of the IVC extending to involve both left common iliac veins. Ultimately, a moderate to long segment apparently extrinsic malignant narrowing involving the mid/distal aspect of the IVC was successfully traversed with inferior venogram demonstrating patency of the more cranial infrarenal IVC. Next, with the use of a Angiojet device, approximately 250 cc of a solution of 10 mg of tPA mixed in 500 mg of saline was infused throughout the occluded segment of the IVC, left common and external iliac veins to the level of the left common femoral vein. Attention was now paid towards access of the right lower extremity popliteal vein. After the overlying soft tissues were anesthetized with 1% lidocaine, the right popliteal vein was accessed micropuncture kit. Limited venogram was performed through the outer micro puncture sheath. This allowed for placement of a 6 French, 35 cm vascular sheath. Again, with the use of a regular glidewire, a Kumpe catheter was advanced beyond the obstructing caval lesion. Contrast injection confirmed appropriate positioning. Next, after approximately 20 minute tPA dwell, mechanical thrombectomy was performed with the Angiojet device both from the left lower extremity access as well as the  right lower extremity access. Limited vena cavagram was performed via the right lower extremity access. Kumpe catheter was utilized for measurement purposes and ultimately a 90 cm/20-cm infusion catheter was placed from the right lower extremity access and a 90 cm/30 cm infusion catheter was placed from the left lower extremity access adequately covering the occluded segment  of the bilateral venous systems as well as the caudal aspect of the IVC. Postprocedural spot fluoroscopic images were obtained. Both popliteal vascular sheath were secured in place. Dressings were applied. Patient's was subjectively cold by the end of the procedure with associated elevation in his blood pressure and as such he was administered 10 mg of hydralazine and bear hugger warming device was applied. The patient otherwise tolerated the procedure well without immediate postprocedural complication. FINDINGS: Sonographic evaluation demonstrates patency of the bilateral popliteal veins. Contrast injection demonstrates patency of the bilateral popliteal and superficial femoral veins. There is occlusive thrombus extending from the left common femoral vein and the right common iliac vein through the mid/caudal aspect of the IVC secondary to a moderate to long segment length presumably extrinsic malignant occlusion secondary to known adjacent bulky retroperitoneal lymphadenopathy. Following pharmacological and mechanical thrombectomy as detailed above, there is restored flow as demonstrated on limited right central pelvic venogram with large amount of residual thrombus. Following infusion catheter placement, both side ports traverse both occluded segments of the IVC and pelvic venous systems. IMPRESSION: 1. 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 2. Successful initiation of bilateral mechanical and pharmacologic thrombectomy with  bilateral infusion catheters appropriately positioned. PLAN: The patient will be transferred to ICU for overnight lytic infusion and will return tomorrow for repeat venogram and potential intervention. Electronically Signed   By: Sandi Mariscal M.D.   On: 04/15/2019 17:04   CT BIOPSY  Result Date: 04/12/2019 INDICATION: 39 year old male with a history of testicular cancer previously treated. He presents with new retroperitoneal adenopathy EXAM: CT BIOPSY MEDICATIONS: None. ANESTHESIA/SEDATION: Moderate (conscious) sedation was employed during this procedure. A total of Versed 2.0 mg and Fentanyl 100 mcg was administered intravenously. Moderate Sedation Time: 14 minutes. The patient's level of consciousness and vital signs were monitored continuously by radiology nursing throughout the procedure under my direct supervision. FLUOROSCOPY TIME:  CT COMPLICATIONS: None PROCEDURE: Informed written consent was obtained from the patient after a thorough discussion of the procedural risks, benefits and alternatives. All questions were addressed. Maximal Sterile Barrier Technique was utilized including caps, mask, sterile gowns, sterile gloves, sterile drape, hand hygiene and skin antiseptic. A timeout was performed prior to the initiation of the procedure. Patient positioned prone position on CT gantry table. Scout CT acquired for planning purposes. The patient was then prepped and draped in the usual sterile fashion. 1% lidocaine was used for local anesthesia. Introducer needle was then placed targeting the left-sided retroperitoneal lymphadenopathy. Once we confirmed needle tip position, multiple 18 gauge core biopsy were acquired. Specimen placed in the saline as S fresh specimen. Needle was removed and a final CT was acquired. Patient tolerated the procedure well and remained hemodynamically stable throughout. No complications were encountered and no significant blood loss. IMPRESSION: Status post CT-guided biopsy of  left retroperitoneal lymphadenopathy. Signed, Dulcy Fanny. Dellia Nims, RPVI [facility] Electronically Signed   By: [name] D.O.   On: 04/12/2019 12:56   DG Chest Port 1 View  Result Date: 04/24/2019 CLINICAL DATA:  39 year old male status post PICC placement. EXAM: PORTABLE CHEST 1 VIEW COMPARISON:  Chest radiograph dated 04/18/2019. FINDINGS: Left-sided PICC with tip over the right atrium close to the cavoatrial junction. Faint bilateral lower lung field densities may represent atelectasis or atypical infection. Clinical correlation recommended. There is no pleural effusion or pneumothorax.  The cardiac silhouette is within normal limits. No acute osseous pathology. IMPRESSION: 1. Left-sided PICC with tip over the right atrium. 2. Faint bilateral lower lung field atelectasis or developing infiltrate. Electronically Signed   By: Anner Crete M.D.   On: 04/24/2019 18:07   DG Chest Port 1 View  Result Date: 04/18/2019 CLINICAL DATA:  Fever, testicular cancer EXAM: PORTABLE CHEST 1 VIEW COMPARISON:  None. FINDINGS: Normal heart size. Normal mediastinal contour. No pneumothorax. No pleural effusion. Slightly low lung volumes. Minimal streaky opacities at both lung bases. No pulmonary edema. IMPRESSION: Slightly low lung volumes. Minimal streaky opacities at the lung bases, favoring minimal atelectasis. No consolidative airspace disease to suggest a pneumonia. Electronically Signed   By: Ilona Sorrel M.D.   On: 04/18/2019 15:41   DG Abd Portable 1V  Result Date: 04/25/2019 CLINICAL DATA:  Abdominal distension, history of testicular cancer, history of DVT EXAM: PORTABLE ABDOMEN - 1 VIEW COMPARISON:  04/21/2019, 04/22/2019 FINDINGS: 2 supine frontal views of the abdomen and pelvis demonstrate IVC stent unchanged. Diffuse gaseous distension of the large and small bowel is stable consistent with ileus. No masses or abnormal calcifications. IMPRESSION:  1. Stable gaseous distension of the bowel compatible with ileus. Electronically Signed   By: Randa Ngo M.D.   On: 04/25/2019 16:22   DG Abd Portable 1V  Result Date: 04/18/2019 CLINICAL DATA:  Abdominal pain and distension EXAM: PORTABLE ABDOMEN - 1 VIEW COMPARISON:  04/11/2019 FINDINGS: Supine frontal view of the abdomen and pelvis excludes the hemidiaphragms, right flank, and lower pelvis by collimation. There is diffuse gaseous distention of the colon, likely reflecting postoperative ileus. Stable position of the IVC stent. No masses or abnormal calcifications. IMPRESSION: 1. Distended gas-filled colon consistent with postprocedural ileus. 2. IVC stent as above Electronically Signed   By: Randa Ngo M.D.   On: 04/18/2019 08:41   ECHOCARDIOGRAM COMPLETE  Result Date: 04/19/2019   ECHOCARDIOGRAM REPORT   Patient Name:   Timothy [name] Date of Exam: 04/19/2019 Medical Rec #:  [id number]      Height:       75.0 in Accession #:    [id number]     Weight:       216.9 lb Date of Birth:  1980-12-04       BSA:          2.27 m Patient Age:    38 years       BP:           132/83 mmHg Patient Gender: M              HR:           125 bpm. Exam Location:  Inpatient Procedure: 2D Echo, Color Doppler and Cardiac Doppler                          MODIFIED REPORT: This report was modified by Cherlynn Kaiser MD on 04/19/2019 due to.  Indications:     Fever  History:         Patient has no prior history of Echocardiogram examinations.                  Testicular cancer; Signs/Symptoms:Fever.  Sonographer:     Johny Chess Referring Phys:  [id number] Noland Fordyce MATHEWS Diagnosing Phys: Cherlynn Kaiser MD  Sonographer Comments: Suboptimal apical window. IMPRESSIONS  1. Left ventricular ejection fraction, by visual estimation, is 60 to 65%. The left ventricle has  normal function. Mildly increased left ventricular posterior wall thickness. There is mildly increased left ventricular hypertrophy.  2. Global right ventricle has  normal systolic function.The right ventricular size is normal. Right vetricular wall thickness was not assessed.  3. The mitral valve is normal in structure. No evidence of mitral valve regurgitation. No evidence of mitral stenosis.  4. The tricuspid valve is normal in structure. Tricuspid valve regurgitation is not demonstrated.  5. The aortic valve is normal in structure. Aortic valve regurgitation is not visualized. No evidence of aortic valve sclerosis or stenosis.  6. The inferior vena cava is normal in size with greater than 50% respiratory variability, suggesting right atrial pressure of 3 mmHg.  7. No evidence of valvular vegetations on this transthoracic echocardiogram, however acoustic windows are suboptimal. FINDINGS  Left Ventricle: Left ventricular ejection fraction, by visual estimation, is 60 to 65%. The left ventricle has normal function. The left ventricle is not well visualized. Mildly increased left ventricular posterior wall thickness. There is mildly increased left ventricular hypertrophy. Left ventricular diastolic parameters were normal. Normal left atrial pressure. Right Ventricle: The right ventricular size is normal. Right vetricular wall thickness was not assessed. Global RV systolic function is has normal systolic function. Left Atrium: Left atrial size was normal in size. Right Atrium: Right atrial size was normal in size Pericardium: There is no evidence of pericardial effusion. Mitral Valve: The mitral valve is normal in structure. No evidence of mitral valve regurgitation. No evidence of mitral valve stenosis by observation. Tricuspid Valve: The tricuspid valve is normal in structure. Tricuspid valve regurgitation is not demonstrated. Aortic Valve: The aortic valve is normal in structure. Aortic valve regurgitation is not visualized. The aortic valve is structurally normal, with no evidence of sclerosis or stenosis. Pulmonic Valve: The pulmonic valve was normal in structure. Pulmonic  valve regurgitation is not visualized. Pulmonic regurgitation is not visualized. Aorta: The aortic root is normal in size and structure. Venous: The inferior vena cava is normal in size with greater than 50% respiratory variability, suggesting right atrial pressure of 3 mmHg. IAS/Shunts: The interatrial septum was not well visualized. Additional Comments: No evidence of valvular vegetations on this transthoracic echocardiogram. Would recommend a transesophageal echocardiogram to exclude infective endocarditis if clinically indicated.  LEFT VENTRICLE PLAX 2D LVIDd:         5.00 cm  Diastology LVIDs:         3.10 cm  LV e' lateral: 14.10 cm/s LV PW:         1.10 cm  LV e' medial:  12.30 cm/s LV IVS:        0.80 cm LVOT diam:     2.40 cm LV SV:         80 ml LV SV Index:   35.02 LVOT Area:     4.52 cm  RIGHT VENTRICLE RV S prime:     27.10 cm/s TAPSE (M-mode): 2.8 cm LEFT ATRIUM           Index LA diam:      3.40 cm 1.50 cm/m LA Vol (A4C): 38.8 ml 17.08 ml/m  AORTIC VALVE LVOT Vmax:   105.00 cm/s LVOT Vmean:  74.200 cm/s LVOT VTI:    0.167 m  AORTA Ao Root diam: 3.60 cm  SHUNTS Systemic VTI:  0.17 m Systemic Diam: 2.40 cm  Cherlynn Kaiser MD Electronically signed by Cherlynn Kaiser MD Signature Date/Time: 04/19/2019/7:59:29 PM    Final (Updated)    IR TRANSCATH PLC STENT 1ST ART  NOT LE CV CAR VERT CAR  Result Date: 04/16/2019 INDICATION: History of newly diagnosed recurrent testicular cancer, now with bulky 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 Patient was initiated bilateral pelvic/IVC catheter directed thrombolysis day prior (04/15/2019), and returns today following 24 hours of tPA infusion. EXAM: 1. IR THROMB F/U EVAL ART/VEN FINAL DAY 2. FLUOROSCOPIC GUIDED ANGIOPLASTY AND STENT PLACEMENT OF THE IVC COMPARISON:  Initiation of bilateral catheter directed pelvic venous and IVC thrombolysis - 04/15/2019; CT abdomen pelvis -  04/11/2019 MEDICATIONS: Heparin 7000 units CONTRAST:  85 cc Omnipaque 300 ANESTHESIA/SEDATION: Moderate (conscious) sedation was employed during this procedure. A total of Versed 2 mg and Dilaudid 3 mg was administered intravenously. Moderate Sedation Time: 128 minutes. The patient's level of consciousness and vital signs were monitored continuously by radiology nursing throughout the procedure under my direct supervision. FLUOROSCOPY TIME:  14 minutes, 12 seconds (510.2 mGy) COMPLICATIONS: None immediate. TECHNIQUE: Informed written consent was obtained from the patient after a discussion of the risks, benefits and alternatives to treatment. Questions regarding the procedure were encouraged and answered. A timeout was performed prior to the initiation of the procedure. The patient was placed prone on the fluoroscopy table and the skin posterior to the bilateral knees as well as the external portion the existing bilateral vascular sheaths and infusion catheters was prepped and draped in usual sterile fashion. The surrounding subcutaneous tissues about both popliteal approach vascular sheath was anesthetized with 1% lidocaine with epinephrine. Next, post lysis venograms were performed from the bilateral infusion catheters following removal of the bilateral infusion wires. Images were reviewed and the decision was made to proceed with additional mechanical thrombectomy. With the use of a vertebral catheter exchange length stiff Glidewires were advanced from both existing popliteal vascular sheaths superior to the nearly obstructing caval lesion. Contrast injection confirmed appropriate positioning. Stiff glide wires were advanced into the left subclavian vein. 4000 units of heparin was administered intravenously. Next, beginning with the left lower extremity, the existing 8 French vascular sheath was exchanged over the glidewire for a 13 [place] vascular sheath. The 16 mm diameter Clottreiever Inari mechanical  thrombectomy device was then utilized to perform 3 passes from the left popliteal approach from at and just below level of the obstructing caval lesion. Post thrombectomy images were obtained from the left lower extremity. Next, the existing right lower extremity 6 French popliteal vein vascular sheath was exchanged over a guidewire for a 13 French vascular sheath. Again, from the right lower extremity approach, the 16 mm diameter Clottreiever Inari mechanical thrombectomy device was then utilized to perform 3 passes from the level at and just below the obstructing caval lesion. Post thrombectomy images were obtained from the right lower extremity. An ACT level was obtained and an additional 3000 units of heparin was administered intravenously. The long segment eccentric irregular narrowing/subtotal occlusion of the IVC was subsequently balloon angioplastied at multiple stations with a 14 mm x 4 cm Atlas balloon. Despite adequate balloon apposition, post angioplasty venogram images demonstrate a recurrent hemodynamically significant narrowing. As such, a 20 mm x 60 mm Venovo venous stent was deployed across the superior and mid aspect of the caval lesion and was subsequently balloon angioplastied at multiple stations initially with a 16 mm x 4 cm atlas balloon and ultimately with an 18 mm x 4 cm balloon. Post stent deployment venogram images were obtained however there is felt to be incomplete coverage of the distal aspect  of the malignant narrowing. As such, an additional 20 mm x 40 mm Venovo venous stent was deployed overlapping the mid/caudal aspect of venous stent and adequately covering the caudal aspect caval lesion. Both overlapping venous stents were subsequently balloon angioplastied at multiple stations with the 18 mm atlas balloon and completion venogram images were obtained from both lower extremities. Images were reviewed and the procedure was terminated. All wires, catheters and sheaths were removed  from the bilateral popliteal vein accesses and hemostasis was achieved with manual compression. Dressings applied. The patient tolerated the procedure well without immediate postprocedural complication. FINDINGS: Initial post lysis venogram performed from the left lower extremity infusion catheter demonstrates restoration of flow through the left pelvis with moderate amount residual thrombus primarily within the peripheral aspect of the left external iliac vein as well as the left common and central aspect of the left superficial femoral veins. There is passage of contrast beyond the nearly obstructing lesion involving mid/caudal aspect of the IVC. Initial post lysis venogram performed from the right lower extremity demonstrates persistent occlusion at the level of the right common iliac vein. Following 3 passes from each lower extremity with the 16 mm diameter Clottreiever Inari mechanical thrombectomy device, nearly all residual clot burden within the pelvis was removed. 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 This lesion was again noted to be significantly caudal to the confluence of the bilateral renal veins. As such, nearly obstructing caval lesion was treated with overlapping stent deployment of 20 mm diameter Venovo venous stents which were subsequently balloon angioplastied to 18 mm diameter. Completion images demonstrate a technically excellent result with restored brisk flow through the bilateral pelvic systems and the IVC. There is a mild residual narrowing at the level of the mid/caudal aspect of the IVC, though not felt to result in a residual hemodynamically significant stenosis. No evidence of complication. Specifically, no evidence of contrast extravasation  or vessel dissection. 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 PLAN: - Maintain IV heparin drip until patient is successfully converted to long-term anticoagulant. - Recommend maintaining SCD devices while in bed. Electronically Signed   By: Sandi Mariscal M.D.   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 EXAM: MRI ABDOMEN WITHOUT AND WITH CONTRAST TECHNIQUE: Multiplanar multisequence MR imaging of the abdomen was performed both before and after the administration of intravenous contrast. CONTRAST:  21m GADAVIST GADOBUTROL 1 MMOL/ML IV SOLN COMPARISON:  CT 04/11/2019 FINDINGS: Vascular: Arterial: Unremarkable course caliber and contour of the visualized abdominal aorta. Unremarkable course caliber and contour of the bilateral iliac arteries and proximal femoral arteries. Bilateral hypogastric arteries are patent. Venous: Right iliac/femoral system: Flow signal is maintained within the visualized proximal femoral veins. Flow signal maintained into the external iliac vein. Absent flow signal with evidence of thrombus involving the proximal right common iliac vein to the confluence and into the IVC. Expansion of the right common iliac vein. Multiple pelvic collateral vessels. Left iliac/femoral system: Absent flow signal through the length of the left iliac venous system extending through  the femoral vein. Expansion of the left iliac and femoral venous system. Multiple pelvic collateral veins. IVC: The IVC is not well evaluated at the level of the adenopathy. Absent flow signal above the confluence of the iliac veins  compatible with ilio caval thrombus. The juxtarenal IVC is patent with maintained flow signal and maintain flow signal of the bilateral renal veins. Nonvascular: Unremarkable musculoskeletal structures. Partially distended urinary bladder. Unremarkable appearance of the visualized kidneys and GI system. Edema of the lower abdominal wall/pelvic body wall, as well as edema of the left proximal lower extremity. 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 Pathologic lymphadenopathy of the retroperitoneum, similar to comparison CT, contributing to external compression of the infrarenal IVC. The current MR is nondiagnostic to confirm or rule out tumor thrombus. Lower abdominal/pelvic wall edema including edema of the proximal left lower extremity. Signed, Dulcy Fanny. Dellia Nims, RPVI [facility] Electronically Signed   By: [name] D.O.   On: 04/13/2019 14:14   VAS [place] LOWER EXTREMITY VENOUS (DVT)  Result Date: 04/11/2019  Lower Venous Study Indications: Swelling.  Risk Factors: None identified. Anticoagulation: Heparin. Limitations: Poor ultrasound/tissue interface and bowel gas. Comparison Study: No prior studies. Performing Technologist: Oliver Hum RVT  Examination Guidelines: A complete evaluation includes B-mode imaging, spectral Doppler, color Doppler, and power Doppler as needed of all accessible portions of each vessel. Bilateral testing is considered an integral part of a complete examination. Limited examinations for reoccurring indications may be performed as noted.  +---------+---------------+---------+-----------+----------+--------------+ RIGHT    CompressibilityPhasicitySpontaneityPropertiesThrombus Aging +---------+---------------+---------+-----------+----------+--------------+ CFV      Full           Yes       Yes                                 +---------+---------------+---------+-----------+----------+--------------+ SFJ      Full                                                        +---------+---------------+---------+-----------+----------+--------------+ FV Prox  Full                                                        +---------+---------------+---------+-----------+----------+--------------+ FV Mid   Full                                                        +---------+---------------+---------+-----------+----------+--------------+ FV DistalFull                                                        +---------+---------------+---------+-----------+----------+--------------+ PFV      Full                                                        +---------+---------------+---------+-----------+----------+--------------+  POP      Full           Yes      Yes                                 +---------+---------------+---------+-----------+----------+--------------+ PTV      Full                                                        +---------+---------------+---------+-----------+----------+--------------+ PERO     Full                                                        +---------+---------------+---------+-----------+----------+--------------+   +---------+---------------+---------+-----------+----------+--------------+ LEFT     CompressibilityPhasicitySpontaneityPropertiesThrombus Aging +---------+---------------+---------+-----------+----------+--------------+ CFV      None           No       No                   Acute          +---------+---------------+---------+-----------+----------+--------------+ SFJ      None                                         Acute          +---------+---------------+---------+-----------+----------+--------------+ FV Prox  None           No       No                   Acute           +---------+---------------+---------+-----------+----------+--------------+ FV Mid   None           No       No                   Acute          +---------+---------------+---------+-----------+----------+--------------+ FV DistalNone           No       No                   Acute          +---------+---------------+---------+-----------+----------+--------------+ POP      Partial        No       No                   Acute          +---------+---------------+---------+-----------+----------+--------------+ PTV      Full                                                        +---------+---------------+---------+-----------+----------+--------------+ PERO     None  Acute          +---------+---------------+---------+-----------+----------+--------------+ EIV      None           No       No                   Acute          +---------+---------------+---------+-----------+----------+--------------+ Unable to visualize CIV, or IVC due to bowel gas.    Summary: Right: There is no evidence of deep vein thrombosis in the lower extremity. No cystic structure found in the popliteal fossa. Left: Findings consistent with acute deep vein thrombosis involving the left external iliac vein, common femoral vein, left femoral vein, left popliteal vein, and left peroneal veins. No cystic structure found in the popliteal fossa. Unable to visualize CIV, or IVC due to bowel gas.  *See table(s) above for measurements and observations. Electronically signed by Deitra Mayo MD on 04/11/2019 at 4:09:39 PM.    Final    [place] EKG SITE RITE  Result Date: 04/24/2019 If Site Rite image not attached, placement could not be confirmed due to current cardiac rhythm.  [place] EKG SITE RITE  Result Date: 04/21/2019 If Site Rite image not attached, placement could not be confirmed due to current cardiac rhythm.  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 Patient has experienced minimal improvement since initiation of IV heparin, and as such request made for initiation of catheter directed thrombolysis for symptomatic purposes. EXAM: 1. ULTRASOUND GUIDANCE FOR VASCULAR ACCESS X2 2. FLUOROSCOPIC GUIDED PHARMACOLOGICAL AND MECHANICAL THROMBECTOMY WITH ANGIOJET DEVICE 3. BILATERAL LOWER EXTREMITY VENOGRAM AND PLACEMENT OF INFUSION CATHETERS COMPARISON:  CT chest, abdomen pelvis-04/11/2019; abdominal MRV - 04/13/2019 MEDICATIONS: Hydralazine 10 mg IV, administered near the completion of the procedure secondary to persistently elevated blood pressure. CONTRAST:  60 cc Omnipaque 300 ANESTHESIA/SEDATION: Moderate (conscious) sedation was employed during this procedure. A total of Versed 3 mg and Fentanyl 125 mcg was administered intravenously. Moderate Sedation Time: 67 minutes. The patient's level of consciousness and vital signs were monitored continuously by radiology nursing throughout the procedure under my direct supervision. FLUOROSCOPY TIME:  18 minutes, 18 seconds (650 mGy) COMPLICATIONS: None immediate. TECHNIQUE: Informed written consent was obtained from the patient after a discussion of the risks (including but not limited to bleeding, including nontarget bleeding given history of malignancy, infection, pulmonary embolism and necessity for venous angioplasty and/or stent placement), benefits and alternatives to treatment. Questions regarding the procedure were encouraged and answered. A timeout was performed prior to the initiation of the procedure. The patient was placed prone on the fluoroscopy table and the skin posterior to the bilateral knees was prepped and draped in the usual sterile fashion, and a sterile drape was applied covering the  operative field. Maximum barrier sterile technique with sterile gowns and gloves were used for the procedure. Under direct ultrasound guidance, the left popliteal vein was access with a micropuncture kit after the overlying soft tissues were anesthetized with 1% lidocaine. An ultrasound image was saved for documentation purposed. This allowed for placement of a 8-French vascular sheath. A limited venogram was performed through the side arm of the vascular sheath. With the use of a regular glidewire, a Kumpe catheter was advanced through the left femoral, external iliac vein and through the left common iliac vein to  the level of the IVC. Limited vena cavogram demonstrates complete occlusion of the distal aspect of the IVC extending to involve both left common iliac veins. Ultimately, a moderate to long segment apparently extrinsic malignant narrowing involving the mid/distal aspect of the IVC was successfully traversed with inferior venogram demonstrating patency of the more cranial infrarenal IVC. Next, with the use of a Angiojet device, approximately 250 cc of a solution of 10 mg of tPA mixed in 500 mg of saline was infused throughout the occluded segment of the IVC, left common and external iliac veins to the level of the left common femoral vein. Attention was now paid towards access of the right lower extremity popliteal vein. After the overlying soft tissues were anesthetized with 1% lidocaine, the right popliteal vein was accessed micropuncture kit. Limited venogram was performed through the outer micro puncture sheath. This allowed for placement of a 6 French, 35 cm vascular sheath. Again, with the use of a regular glidewire, a Kumpe catheter was advanced beyond the obstructing caval lesion. Contrast injection confirmed appropriate positioning. Next, after approximately 20 minute tPA dwell, mechanical thrombectomy was performed with the Angiojet device both from the left lower extremity access as well as the  right lower extremity access. Limited vena cavagram was performed via the right lower extremity access. Kumpe catheter was utilized for measurement purposes and ultimately a 90 cm/20-cm infusion catheter was placed from the right lower extremity access and a 90 cm/30 cm infusion catheter was placed from the left lower extremity access adequately covering the occluded segment of the bilateral venous systems as well as the caudal aspect of the IVC. Postprocedural spot fluoroscopic images were obtained. Both popliteal vascular sheath were secured in place. Dressings were applied. Patient's was subjectively cold by the end of the procedure with associated elevation in his blood pressure and as such he was administered 10 mg of hydralazine and bear hugger warming device was applied. The patient otherwise tolerated the procedure well without immediate postprocedural complication. FINDINGS: Sonographic evaluation demonstrates patency of the bilateral popliteal veins. Contrast injection demonstrates patency of the bilateral popliteal and superficial femoral veins. There is occlusive thrombus extending from the left common femoral vein and the right common iliac vein through the mid/caudal aspect of the IVC secondary to a moderate to long segment length presumably extrinsic malignant occlusion secondary to known adjacent bulky retroperitoneal lymphadenopathy. Following pharmacological and mechanical thrombectomy as detailed above, there is restored flow as demonstrated on limited right central pelvic venogram with large amount of residual thrombus. Following infusion catheter placement, both side ports traverse both occluded segments of the IVC and pelvic venous systems. IMPRESSION: 1. 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 2. Successful initiation of bilateral mechanical and pharmacologic thrombectomy with  bilateral infusion catheters appropriately positioned. PLAN: The patient will be transferred to ICU for overnight lytic infusion and will return tomorrow for repeat venogram and potential intervention. Electronically Signed   By: Sandi Mariscal M.D.   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 Patient was initiated bilateral pelvic/IVC catheter directed thrombolysis day prior (04/15/2019), and returns today following 24 hours of tPA infusion. EXAM: 1. IR THROMB F/U EVAL ART/VEN FINAL DAY  2. FLUOROSCOPIC GUIDED ANGIOPLASTY AND STENT PLACEMENT OF THE IVC COMPARISON:  Initiation of bilateral catheter directed pelvic venous and IVC thrombolysis - 04/15/2019; CT abdomen pelvis - 04/11/2019 MEDICATIONS: Heparin 7000 units CONTRAST:  85 cc Omnipaque 300 ANESTHESIA/SEDATION: Moderate (conscious) sedation was employed during this procedure. A total of Versed 2 mg and Dilaudid 3 mg was administered intravenously. Moderate Sedation Time: 128 minutes. The patient's level of consciousness and vital signs were monitored continuously by radiology nursing throughout the procedure under my direct supervision. FLUOROSCOPY TIME:  14 minutes, 12 seconds (836.6 mGy) COMPLICATIONS: None immediate. TECHNIQUE: Informed written consent was obtained from the patient after a discussion of the risks, benefits and alternatives to treatment. Questions regarding the procedure were encouraged and answered. A timeout was performed prior to the initiation of the procedure. The patient was placed prone on the fluoroscopy table and the skin posterior to the bilateral knees as well as the external portion the existing bilateral vascular sheaths and infusion catheters was prepped and draped in usual  sterile fashion. The surrounding subcutaneous tissues about both popliteal approach vascular sheath was anesthetized with 1% lidocaine with epinephrine. Next, post lysis venograms were performed from the bilateral infusion catheters following removal of the bilateral infusion wires. Images were reviewed and the decision was made to proceed with additional mechanical thrombectomy. With the use of a vertebral catheter exchange length stiff Glidewires were advanced from both existing popliteal vascular sheaths superior to the nearly obstructing caval lesion. Contrast injection confirmed appropriate positioning. Stiff glide wires were advanced into the left subclavian vein. 4000 units of heparin was administered intravenously. Next, beginning with the left lower extremity, the existing 8 French vascular sheath was exchanged over the glidewire for a 13 [place] vascular sheath. The 16 mm diameter Clottreiever Inari mechanical thrombectomy device was then utilized to perform 3 passes from the left popliteal approach from at and just below level of the obstructing caval lesion. Post thrombectomy images were obtained from the left lower extremity. Next, the existing right lower extremity 6 French popliteal vein vascular sheath was exchanged over a guidewire for a 13 French vascular sheath. Again, from the right lower extremity approach, the 16 mm diameter Clottreiever Inari mechanical thrombectomy device was then utilized to perform 3 passes from the level at and just below the obstructing caval lesion. Post thrombectomy images were obtained from the right lower extremity. An ACT level was obtained and an additional 3000 units of heparin was administered intravenously. The long segment eccentric irregular narrowing/subtotal occlusion of the IVC was subsequently balloon angioplastied at multiple stations with a 14 mm x 4 cm Atlas balloon. Despite adequate balloon apposition, post angioplasty venogram images demonstrate a  recurrent hemodynamically significant narrowing. As such, a 20 mm x 60 mm Venovo venous stent was deployed across the superior and mid aspect of the caval lesion and was subsequently balloon angioplastied at multiple stations initially with a 16 mm x 4 cm atlas balloon and ultimately with an 18 mm x 4 cm balloon. Post stent deployment venogram images were obtained however there is felt to be incomplete coverage of the distal aspect of the malignant narrowing. As such, an additional 20 mm x 40 mm Venovo venous stent was deployed overlapping the mid/caudal aspect of venous stent and adequately covering the caudal aspect caval lesion. Both overlapping venous stents were subsequently balloon angioplastied at multiple stations with the 18 mm atlas balloon and completion venogram images were obtained from both lower extremities. Images were reviewed and the procedure  was terminated. All wires, catheters and sheaths were removed from the bilateral popliteal vein accesses and hemostasis was achieved with manual compression. Dressings applied. The patient tolerated the procedure well without immediate postprocedural complication. FINDINGS: Initial post lysis venogram performed from the left lower extremity infusion catheter demonstrates restoration of flow through the left pelvis with moderate amount residual thrombus primarily within the peripheral aspect of the left external iliac vein as well as the left common and central aspect of the left superficial femoral veins. There is passage of contrast beyond the nearly obstructing lesion involving mid/caudal aspect of the IVC. Initial post lysis venogram performed from the right lower extremity demonstrates persistent occlusion at the level of the right common iliac vein. Following 3 passes from each lower extremity with the 16 mm diameter Clottreiever Inari mechanical thrombectomy device, nearly all residual clot burden within the pelvis was removed. Despite balloon  angioplasty at multiple stations to 14 mm diameter with excellent balloon apposition, post angioplasty inferior vena cavogram demonstrates a recurrent hemodynamically significant malignant narrowing/subtotal occlusion involving the mid/distal aspect of IVC (series 22). This lesion was again noted to be significantly caudal to the confluence of the bilateral renal veins. As such, nearly obstructing caval lesion was treated with overlapping stent deployment of 20 mm diameter Venovo venous stents which were subsequently balloon angioplastied to 18 mm diameter. Completion images demonstrate a technically excellent result with restored brisk flow through the bilateral pelvic systems and the IVC. There is a mild residual narrowing at the level of the mid/caudal aspect of the IVC, though not felt to result in a residual hemodynamically significant stenosis. No evidence of complication. Specifically, no evidence of contrast extravasation or vessel dissection. 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 PLAN: - Maintain IV heparin drip until patient is successfully converted to long-term anticoagulant. - Recommend maintaining SCD devices while in bed. Electronically Signed   By: Sandi Mariscal M.D.   On: 04/16/2019 16:26    ASSESSMENT AND PLAN:  1.  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  extensive Left lower extremity DVT, s/p thrombolysis and IVC stent placement 4. Fever with unknown origin, probably tumor fever, resolved now  5. Ilius, improving  6. Worsening anemia, s/p one unit blood 2/7 7.deconditioning, continue PT  8.  History of stage Ib seminoma testicular cancer diagnosed in 2012, status post right orchiectomy-no adjuvant chemotherapy given  Recommendations: -Labs reviewed, adequate for  treatment, will proceed last day cisplatin and etoposide today -Hemoglobin 6.9, she will receive 1 unit of blood today with iv lasix 22mq -Potassium 3.4, will increase IV potassium to 872m today (602myesterday), and ordered oral KCL bid  -Magnesium level trending down, will add iv mag 2g in hydration  -pt comfortable with Lovenox injection, his fiance will come tomorrow and learn the injections  -pt wants to fill his dishcarge meds today if possible, will defer to Dr. PowFlorene GlenI will [facility] -OK to discharge tomorrow, from oncology standpoint   YanTruitt Merle/02/2020 .

## 2019-04-28 NOTE — TOC Benefit Eligibility Note (Signed)
Transition of Care [facility]) Benefit Eligibility Note    Patient Details  Name: Jasson Schwein MRN: [id number] Date of Birth: 1980/10/18   Medication/Dose: Alveda Reasons 20mg   po, lovenoxnot covered enoxaparin 135mg  sq  Covered?: Yes  Tier: 3 Drug  Prescription Coverage Preferred Pharmacy: local pharmacy  Spoke with Person/Company/Phone Number:: Donna/ [employer] [phone]  Co-Pay: xarelto no generic $477.19, Enoxaparin $20.00  Prior Approval: No          Kerin Salen Phone Number: 04/28/2019, 3:38 PM

## 2019-04-28 NOTE — Progress Notes (Signed)
Physical Therapy Treatment Patient Details Name: Timothy [name] MRN: [id number] DOB: 04-07-1980 Today's Date: 04/28/2019    History of Present Illness Pt is a 39 y.o. male admitted 04/10/19 with LLE swelling and back pain; pt lives in [place] but drove down to [place] to visit mother ~1 wk ago. Found to have extensive LLE DVT; also with significant lymphadenopathy in the retroperitoneal area concerning for metastatic disease of unknown primary. Biopsy consistent with metastatic seminoma. S/p mechanical and pharmacological thrombolysis with stenting of the IVC for resistant malignant narrowing. Pt with ilieus 2/1. 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    PT Comments    Pt's last CHEMO Tx is today and he is hoping to go home this weekend.  "Feeling better".  General Gait Details: tolerated an increased distance with mild pain in low back and B LE "stiffness"  "edema"   Follow Up Recommendations  No PT follow up     Equipment Recommendations  Rolling walker with 5" wheels;Cane(pending pt need at time of D/C)    Recommendations for Other Services       Precautions / Restrictions Precautions Precautions: Fall Precaution Comments: CHEMO Restrictions Weight Bearing Restrictions: No    Mobility  Bed Mobility               General bed mobility comments: OOB in recliner  Transfers Overall transfer level: Needs assistance Equipment used: Rolling walker (2 wheeled) Transfers: Sit to/from Stand Sit to Stand: Supervision;Modified independent (Device/Increase time) Stand pivot transfers: Modified independent (Device/Increase time);Supervision       General transfer comment: for safety  Ambulation/Gait Ambulation/Gait assistance: Supervision Gait Distance (Feet): 950 Feet Assistive device: Rolling walker (2 wheeled) Gait Pattern/deviations: Decreased stride length;Trunk flexed;Step-through pattern;Wide base of support;Decreased step  length - left Gait velocity: decreased   General Gait Details: tolerated an increased distance with mild pain in low back and B LE "stiffness"  "edema"   Stairs             Wheelchair Mobility    Modified Rankin (Stroke Patients Only)       Balance                                            Cognition Arousal/Alertness: Awake/alert Behavior During Therapy: WFL for tasks assessed/performed Overall Cognitive Status: Within Functional Limits for tasks assessed                                 General Comments: AxO x 4 and very intelligent, 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      Exercises      General Comments        Pertinent Vitals/Pain Pain Assessment: No/denies pain    Home Living                      Prior Function            PT Goals (current goals can now be found in the care plan section) Progress towards PT goals: Progressing toward goals    Frequency    Min 3X/week      PT Plan Current plan remains appropriate    Co-evaluation  AM-PAC PT "6 Clicks" Mobility   Outcome Measure  Help needed turning from your back to your side while in a flat bed without using bedrails?: None Help needed moving from lying on your back to sitting on the side of a flat bed without using bedrails?: None Help needed moving to and from a bed to a chair (including a wheelchair)?: None Help needed standing up from a chair using your arms (e.g., wheelchair or bedside chair)?: None Help needed to walk in hospital room?: A Little Help needed climbing 3-5 steps with a railing? : A Little 6 Click Score: 22    End of Session Equipment Utilized During Treatment: Gait belt Activity Tolerance: Patient tolerated treatment well Patient left: in chair;with call bell/phone within reach Nurse Communication: Mobility status PT Visit Diagnosis: Other abnormalities of gait and mobility (R26.89);Pain;Difficulty  in walking, not elsewhere classified (R26.2)     Time: [id number] PT Time Calculation (min) (ACUTE ONLY): 42 min  Charges:  $Gait Training: 23-37 mins $Therapeutic Activity: 8-22 mins                     Rica Koyanagi  PTA Acute  Rehabilitation Services Pager      [phone] Office      [phone]

## 2019-04-28 NOTE — Progress Notes (Signed)
CRITICAL VALUE ALERT  Critical Value:  Hemoglobin 6.9  Date & Time Notied:  04/28/19 [id number]  Provider Notified: Sharlet Salina NP  Orders Received/Actions taken: Provider notified

## 2019-04-28 NOTE — Progress Notes (Addendum)
Spoke with Dr. Burr Medico. Patient is ok for treatment today.  Hg: 6.9 and patient will receive 1 unit PRBC. K 3.3: MD increased potassium to 58mEq in IVF and MD will include po potassium (57mEq po BID). Pt will also receive lasix today as well. Magnesium 2 and decreasing: MD would like to include Magnesium sulfate 2gm IV in IVF today. Orders changes as requested.  Hardie Pulley, PharmD, BCPS, BCOP

## 2019-04-28 NOTE — TOC Benefit Eligibility Note (Signed)
Transition of Care [facility]) Benefit Eligibility Note    Patient Details  Name: Timothy [name] MRN: [id number] Date of Birth: May 26, 1980   Medication/Dose: Alveda Reasons 20mg   po, lovenoxnot covered enoxaparin 135mg  sq  Covered?: Yes  Tier: 3 Drug  Prescription Coverage Preferred Pharmacy: local pharmacy  Spoke with Person/Company/Phone Number:: Donna/ [employer] [phone]  Co-Pay: xarelto no generic $477.19, Enoxaparin $20.00  Prior Approval: No          Kerin Salen Phone Number: 04/28/2019, 3:39 PM

## 2019-04-28 NOTE — Progress Notes (Addendum)
PROGRESS NOTE    Timothy Timothy [name]  [id number] DOB: [birth year] DOA: 04/10/2019 PCP: Patient, No Pcp Per   Brief Narrative:  History of testicular carcinoma stage Ib followed at [facility] had right orchiectomy lives in [place] travel to [place] to visit his mother admitted to [place] long due to acute LLE DVT and found to have recurrent cancer. Pt was transferred to  for mechanical thrombolysis of LLE DVT, hospital course complicated by retroperitoneal hematoma, psoas hematoma, anemia, ileus,  AKI, new mild right hydronephrosis presumably from external compression,  fever of unknown origin on empirical abx due to underline immunosuppressed status. Pt was transferred back to [facility] per oncology request to start chemo.  Assessment & Plan:   Principal Problem:   DVT (deep venous thrombosis) (HCC) Active Problems:   H/O testicular cancer   Lymphadenopathy, retroperitoneal   Seminoma (HCC)   Acute deep vein thrombosis (DVT) of left lower extremity (HCC)   Elevated serum creatinine   Abdominal distension   Abscess after procedure   Fever   Ileus (HCC)  Left lower extremity DVT s/p mechanical and pharmacological thrombolysis with stenting of the IVC for resistant malignant narrowing (IR Dr Timothy Timothy [name] on 1/31) Noted bulky retroperitoneal lymphadenopathy and IVC occlusionin the setting of testicular cancer S/P heparin gtt, transition to [place] therapeutic lovenox Long term anticoagulation plan:  - Lovenox to be continued for 30 days, then transition to oral anticoagulation of managing physician's choice - 90 days of 81 mg ASA daily (start aspirin) - Bilateral LE compression stockings, at least knee high  - will need copy of medical records to facilitate Timothy [name] on discharge Per Dr. Stephenie [name]. Timothy Timothy [name], Assistant Professor of Radiology, has kindly agreed to see Timothy Timothy [name], when Timothy Timothy [name] returns home Scheduling: [place]  Office: [phone] Dr. Ottis [name] has also kindly arranged follow-up with the Medical Oncology Department of [facility], and Timothy Timothy [name] should have Timothy Timothy [name] pending office appointment upon arrival  Fever of unknown origin/leukocytosis Last febrile spike, low grade on 04/24/2019, 100.74F, leukocytosis is persistent, improving today BC/UC are negative Likely 2/2 recurrent malignancy Echo normal ejection fraction, no evidence of valvular infection Repeat chest x-ray showed faint bilateral lower lung field atelectasis or developing infiltrate (likely atelectasis due to significant abdominal distention, also has been on cefepime for about Timothy Timothy [name] week now) Will d/c abx as suspect fever is related to malignancy  Sinus tachycardia Likely reactive - now improved CTA chest, no PE noted, although motion artifact TSH 6.484, free T4 1.09 Decision was made to DC telemetry for now as patient needs to start chemotherapy on the sixth floor, of which has no telemetry beds Monitor closely -> resolved  Hypokalemia Replace and follow  AKI/ new onset urinary retention /mild right hydro Cr peaked at 1.46, trended back to normal Urology input appreciated - note by Dr. Louis [name] on 2/7, no recommendation for any urologic intervention at this time Daily CMP  Normocytic anemia  Iron Deficiency  B12 and Folate Deficiency Hemoglobin trending down Multifactorial, from acute illness, psoas hematoma, hematuria, malignancy S/p 1 U of PRBC Repeat transfusion today 2/12 for Hb 6.9 Daily CBC Start iron supplementation B12 and folate supplementation  Ileus Noted abdominal distention, likely 2/2 significant narcotic use (adjusted narcotic dosage) Having loose BM Repeat x-ray abdomen - c/w ileus Repeat x ray today Ambulate as tolerated Will advance diet as tolerated  Recurrent testicular cancer  Noted retroperitoneal lymphoadenopathy Oncology Dr Timothy Timothy [name] on board, started  chemo on 04/24/19  DVT prophylaxis:  lovenox Code Status: full code Family Communication: none at bedside Disposition Plan:  . Patient came from: home           . Anticipated d/c place: home . Barriers to d/c OR conditions which need to be met to effect Timothy Timothy [name] safe d/c: pending clearance by Oncology   Consultants:   Oncology  IR  Urology  Critical Timothy [name]  Procedures:   Mechanical thrombolysis and stenting of IVC  CT biopsy 1/27  Antimicrobials: Anti-infectives (From admission, onward)   Start     Dose/Rate Route Frequency Ordered Stop   04/19/19 1600  metroNIDAZOLE (FLAGYL) IVPB 500 mg  Status:  Discontinued     500 mg 100 mL/hr over 60 Minutes Intravenous Every 8 hours 04/19/19 1554 04/26/19 1836   04/18/19 1900  vancomycin (VANCOREADY) IVPB 1250 mg/250 mL  Status:  Discontinued     1,250 mg 166.7 mL/hr over 90 Minutes Intravenous Every 8 hours 04/18/19 1035 04/20/19 1305   04/18/19 1000  ceFEPIme (MAXIPIME) 1 g in sodium chloride 0.9 % 100 mL IVPB  Status:  Discontinued     1 g 200 mL/hr over 30 Minutes Intravenous Every 12 hours 04/18/19 0748 04/18/19 0926   04/18/19 1000  ceFEPIme (MAXIPIME) 2 g in sodium chloride 0.9 % 100 mL IVPB  Status:  Discontinued     2 g 200 mL/hr over 30 Minutes Intravenous Every 8 hours 04/18/19 0926 04/26/19 1311   04/18/19 0930  vancomycin (VANCOREADY) IVPB 2000 mg/400 mL     2,000 mg 200 mL/hr over 120 Minutes Intravenous  Once 04/18/19 0842 04/18/19 1258     Subjective: Still abdominal distension, no pain, vomiting.  Having BM's.   Objective: Vitals:   04/27/19 1430 04/27/19 2029 04/28/19 0605 04/28/19 1219  BP: 129/88 123/70 123/78 132/76  Pulse: 73 71 79 77  Resp: _0 Temp: 98.6 F (37 C) 98.2 F (36.8 C) 98.2 F (36.8 C) 97.6 F (36.4 C)  TempSrc:  Oral Oral Oral  SpO2: 97% 98% 98% 100%  Weight:      Height:        Intake/Output Summary (Last 24 hours) at 04/28/2019 1507 Last data filed at 04/28/2019 0800 Gross per 24 hour  Intake 240 ml  Output  1975 ml  Net -1735 ml   Filed Weights   04/10/19 2005 04/18/19 0600  Weight: 90.7 kg 98.4 kg    Examination:  General: No acute distress. Cardiovascular: Heart sounds show Timothy Timothy [name] regular rate, and rhythm Lungs: Clear to auscultation bilaterally  Abdomen: Soft, nontender, nondistended Neurological: Alert and oriented 3. Moves all extremities 4. Cranial nerves II through XII grossly intact. Skin: Warm and dry. No rashes or lesions. Extremities: No clubbing or cyanosis. No edema.   Data Reviewed: I have personally reviewed following labs and imaging studies  CBC: Recent Labs  Lab 04/24/19 0414 04/25/19 0552 04/26/19 0537 04/27/19 0629 04/28/19 0546  WBC 13.4* 11.2* 14.5* 13.3* 8.8  NEUTROABS  --  10.1* 13.3* 12.7* 8.2*  HGB 7.2* 7.6* 7.3* 8.1* 6.9*  HCT 22.6* 24.4* 23.1* 25.3* 21.7*  MCV 91.5 92.8 92.4 92.3 93.1  PLT 340 354 409* 534* 342   Basic Metabolic Panel: Recent Labs  Lab 04/24/19 0414 04/25/19 0552 04/26/19 0537 04/27/19 0629 04/28/19 0546  NA 135 138 138 139 137  K 2.8* 3.5 3.4* 3.2* 3.3*  CL 105 108 108 107 108  CO2 23 21* _1 GLUCOSE  127* 153* 135* 115* 101*  BUN 16 19 24* 24* 22*  CREATININE 1.39* 0.83 0.81 0.85 0.71  CALCIUM 7.6* 7.4* 7.3* 7.4* 7.0*  MG 2.5* 2.7* 2.6* 2.4 2.0   GFR: Estimated Creatinine Clearance: 156.7 mL/min (by C-G formula based on SCr of 0.71 mg/dL). Liver Function Tests: Recent Labs  Lab 04/23/19 0930 04/25/19 0552 04/26/19 0537 04/27/19 0629 04/28/19 0546  AST 32 _0 ALT _1 ALKPHOS 67 83 70 69 53  BILITOT 0.8 0.9 1.0 0.6 0.6  PROT 6.1* 6.3* 6.2* 6.6 5.6*  ALBUMIN 2.3* 2.7* 2.5* 2.8* 2.5*   No results for input(s): LIPASE, AMYLASE in the last 168 hours. No results for input(s): AMMONIA in the last 168 hours. Coagulation Profile: No results for input(s): INR, PROTIME in the last 168 hours. Cardiac Enzymes: No results for input(s): CKTOTAL, CKMB, CKMBINDEX, TROPONINI in the last 168  hours. BNP (last 3 results) No results for input(s): PROBNP in the last 8760 hours. HbA1C: No results for input(s): HGBA1C in the last 72 hours. CBG: No results for input(s): GLUCAP in the last 168 hours. Lipid Profile: No results for input(s): CHOL, HDL, LDLCALC, TRIG, CHOLHDL, LDLDIRECT in the last 72 hours. Thyroid Function Tests: No results for input(s): TSH, T4TOTAL, FREET4, T3FREE, THYROIDAB in the last 72 hours. Anemia Panel: Recent Labs    04/27/19 1045 04/27/19 1047  VITAMINB12  --  125*  FOLATE 4.5*  --    Sepsis Labs: No results for input(s): PROCALCITON, LATICACIDVEN in the last 168 hours.  Recent Results (from the past 240 hour(s))  Culture, blood (routine x 2)     Status: None   Collection Time: 04/22/19 11:58 AM   Specimen: BLOOD LEFT WRIST  Result Value Ref Range Status   Specimen Description BLOOD LEFT WRIST  Final   Special Requests AEROBIC BOTTLE ONLY Blood Culture adequate volume  Final   Culture   Final    NO GROWTH 5 DAYS Performed at [facility], [address] [place], [place] [address]    Report Status 04/27/2019 FINAL  Final  Culture, blood (routine x 2)     Status: None   Collection Time: 04/22/19 11:59 AM   Specimen: BLOOD LEFT HAND  Result Value Ref Range Status   Specimen Description BLOOD LEFT HAND  Final   Special Requests AEROBIC BOTTLE ONLY Blood Culture adequate volume  Final   Culture   Final    NO GROWTH 5 DAYS Performed at [facility], [employer] [address] [place], [place] [address]    Report Status 04/27/2019 FINAL  Final         Radiology Studies: No results found.      Scheduled Meds: . sodium chloride   Intravenous Once  . aspirin  81 mg Oral Daily  . Chlorhexidine Gluconate Cloth  6 each Topical Daily  . CISplatin  20 mg/m2 (Treatment Plan Recorded) Intravenous Once  . cyanocobalamin  1,000 mcg Intramuscular Daily  . dextrose 5 % and 0.45% NaCl 1,000 mL with potassium chloride 80 mEq, magnesium  sulfate 2 g infusion   Intravenous Once  . enoxaparin (LOVENOX) injection  1 mg/kg Subcutaneous Q12H  . etoposide  100 mg/m2 (Treatment Plan Recorded) Intravenous Once  . feeding supplement (ENSURE ENLIVE)  237 mL Oral BID BM  . feeding supplement (PRO-STAT SUGAR FREE 64)  30 mL Oral BID  . ferrous sulfate  325 mg Oral Q breakfast  .  folic acid  1 mg Oral Daily  . furosemide  10 mg Intravenous Once  . multivitamin with minerals  1 tablet Oral Daily  . pantoprazole (PROTONIX) IV  40 mg Intravenous Q12H  . polyethylene glycol  17 g Oral BID  . potassium chloride  20 mEq Oral BID  . sodium chloride flush  10-40 mL Intracatheter Q12H   Continuous Infusions: . sodium chloride    . sodium chloride    . sodium chloride       LOS: 17 days    Time spent: over 30 min    Fayrene Helper, MD [facility]   To contact the attending provider between 7A-7P or the covering provider during after hours 7P-7A, please log into the web site [email or web address] and access using universal [place] password for that web site. If you do not have the password, please call the hospital operator.  04/28/2019, 3:07 PM

## 2019-04-28 NOTE — Progress Notes (Signed)
OT Cancellation Note  Patient Details Name: Timothy [name] MRN: [id number] DOB: 1981-02-04   Cancelled Treatment:    Reason Eval/Treat Not Completed: Other (comment)  Spoke to pt yesterday; he feels he will be fine at home--finance will assist as needed.  Will sign off.  [place], OTR/L Acute Rehabilitation Services 04/28/2019 04/28/2019, 1:23 PM

## 2019-04-28 NOTE — Progress Notes (Signed)
Pt declined administering scheduled bedtime Lovenox dose stating that he will administer both scheduled doses 04/28/19

## 2019-04-28 NOTE — Progress Notes (Addendum)
Brief oncology note:  I spoke with [facility] and arranged outpatient follow-up for Timothy [name].  He will be seen by Dr. Coralee North on Thursday, 05/11/2019 at 9 AM for initial consult.  He is to arrive 30 to 45 minutes early to register.  Appointment will be in the [facility] building and the address is [address] [place] [address].  I have called [facility] and they will place all CT scans and MRIs on a disc for Timothy [name] to bring to his appointment at [facility].  I have sent a message to our HIM department to fax any remaining hospital records to [phone].  [name], DNP, AGPCNP-BC, AOCNP

## 2019-04-29 LAB — COMPREHENSIVE METABOLIC PANEL
ALT: 25 U/L (ref 0–44)
AST: 24 U/L (ref 15–41)
Albumin: 2.7 g/dL — ABNORMAL LOW (ref 3.5–5.0)
Alkaline Phosphatase: 61 U/L (ref 38–126)
Anion gap: 7 (ref 5–15)
BUN: 19 mg/dL (ref 6–20)
CO2: 25 mmol/L (ref 22–32)
Calcium: 7 mg/dL — ABNORMAL LOW (ref 8.9–10.3)
Chloride: 106 mmol/L (ref 98–111)
Creatinine, Ser: 0.68 mg/dL (ref 0.61–1.24)
GFR calc Af Amer: >60 mL/min (ref >60)
GFR calc non Af Amer: >60 mL/min (ref >60)
Glucose, Bld: 107 mg/dL — ABNORMAL HIGH (ref 70–99)
Potassium: 3.7 mmol/L (ref 3.5–5.1)
Sodium: 138 mmol/L (ref 135–145)
Total Bilirubin: 0.8 mg/dL (ref 0.3–1.2)
Total Protein: 5.7 g/dL — ABNORMAL LOW (ref 6.5–8.1)

## 2019-04-29 LAB — CBC WITH DIFFERENTIAL/PLATELET
Abs Immature Granulocytes: 0.11 10*3/uL — ABNORMAL HIGH (ref 0.00–0.07)
Basophils Absolute: 0 10*3/uL (ref 0.0–0.1)
Basophils Relative: 0 %
Eosinophils Absolute: 0 10*3/uL (ref 0.0–0.5)
Eosinophils Relative: 0 %
HCT: 24.1 % — ABNORMAL LOW (ref 39.0–52.0)
Hemoglobin: 7.8 g/dL — ABNORMAL LOW (ref 13.0–17.0)
Immature Granulocytes: 1 %
Lymphocytes Relative: 6 %
Lymphs Abs: 0.5 10*3/uL — ABNORMAL LOW (ref 0.7–4.0)
MCH: 29.8 pg (ref 26.0–34.0)
MCHC: 32.4 g/dL (ref 30.0–36.0)
MCV: 92 fL (ref 80.0–100.0)
Monocytes Absolute: 0.1 10*3/uL (ref 0.1–1.0)
Monocytes Relative: 1 %
Neutro Abs: 7.8 10*3/uL — ABNORMAL HIGH (ref 1.7–7.7)
Neutrophils Relative %: 92 %
Platelets: 393 10*3/uL (ref 150–400)
RBC: 2.62 MIL/uL — ABNORMAL LOW (ref 4.22–5.81)
RDW: 14.5 % (ref 11.5–15.5)
WBC: 8.5 10*3/uL (ref 4.0–10.5)
nRBC: 0 % (ref 0.0–0.2)

## 2019-04-29 LAB — MAGNESIUM: Magnesium: 2 mg/dL (ref 1.7–2.4)

## 2019-04-29 LAB — PHOSPHORUS: Phosphorus: 2 mg/dL — ABNORMAL LOW (ref 2.5–4.6)

## 2019-04-29 MED ORDER — VITAMIN B-12 1000 MCG PO TABS: 1000.0000 ug | ORAL_TABLET | Freq: Every day | ORAL | 0 refills | Status: DC

## 2019-04-29 MED ORDER — ADULT MULTIVITAMIN W/MINERALS CH: 1.0000 | ORAL_TABLET | Freq: Every day | ORAL | 0 refills | Status: DC

## 2019-04-29 MED ORDER — FOLIC ACID 1 MG PO TABS: 1.0000 mg | ORAL_TABLET | Freq: Every day | ORAL | 0 refills | Status: DC

## 2019-04-29 MED ORDER — ENOXAPARIN (LOVENOX) PATIENT EDUCATION KIT
PACK | Freq: Once | Status: AC
  Filled 2019-04-29: qty 1

## 2019-04-29 MED ORDER — ASPIRIN 81 MG PO CHEW: 81.0000 mg | CHEWABLE_TABLET | Freq: Every day | ORAL | 0 refills | Status: AC

## 2019-04-29 MED ORDER — PANTOPRAZOLE SODIUM 40 MG PO TBEC: 40.0000 mg | DELAYED_RELEASE_TABLET | Freq: Every day | ORAL | 1 refills | Status: DC

## 2019-04-29 MED ORDER — POLYETHYLENE GLYCOL 3350 17 G PO PACK: 17.0000 g | PACK | Freq: Every day | ORAL | 0 refills | Status: AC

## 2019-04-29 MED ORDER — ENOXAPARIN SODIUM 100 MG/ML ~~LOC~~ SOLN: 1.0000 mg/kg | Freq: Two times a day (BID) | SUBCUTANEOUS | 0 refills | Status: DC

## 2019-04-29 MED ORDER — ENOXAPARIN SODIUM 100 MG/ML ~~LOC~~ SOLN: 1.0000 mg/kg | Freq: Two times a day (BID) | SUBCUTANEOUS | 0 refills | Status: AC

## 2019-04-29 MED ORDER — FERROUS SULFATE 325 (65 FE) MG PO TABS: 325.0000 mg | ORAL_TABLET | Freq: Every day | ORAL | 0 refills | Status: DC

## 2019-04-29 MED ORDER — POLYETHYLENE GLYCOL 3350 17 G PO PACK: 17.0000 g | PACK | Freq: Every day | ORAL | 0 refills | Status: DC

## 2019-04-29 MED ORDER — VITAMIN B-12 1000 MCG PO TABS: 1000.0000 ug | ORAL_TABLET | Freq: Every day | ORAL | 0 refills | Status: AC

## 2019-04-29 MED ORDER — ASPIRIN 81 MG PO CHEW: 81.0000 mg | CHEWABLE_TABLET | Freq: Every day | ORAL | 0 refills | Status: DC

## 2019-04-29 MED ORDER — FOLIC ACID 1 MG PO TABS: 1.0000 mg | ORAL_TABLET | Freq: Every day | ORAL | 0 refills | Status: AC

## 2019-04-29 MED ORDER — FERROUS SULFATE 325 (65 FE) MG PO TABS: 325.0000 mg | ORAL_TABLET | Freq: Every day | ORAL | 0 refills | Status: AC

## 2019-04-29 MED ORDER — OXYCODONE HCL 5 MG PO TABS: 5.0000 mg | ORAL_TABLET | ORAL | 0 refills | Status: AC | PRN

## 2019-04-29 MED ORDER — PANTOPRAZOLE SODIUM 40 MG PO TBEC: 40.0000 mg | DELAYED_RELEASE_TABLET | Freq: Every day | ORAL | 1 refills | Status: AC

## 2019-04-29 MED ORDER — ADULT MULTIVITAMIN W/MINERALS CH: 1.0000 | ORAL_TABLET | Freq: Every day | ORAL | 0 refills | Status: AC

## 2019-04-29 NOTE — Progress Notes (Signed)
Patient discharged to home via [facility], discharge instructions reviewed with patient who verbalized understanding. Lovenox education done.

## 2019-04-29 NOTE — Discharge Summary (Signed)
Physician Discharge Summary  Timothy [name] VWP:[id number] DOB: 1981/01/04 DOA: 04/10/2019  PCP: Patient, No Pcp Per  Admit date: 04/10/2019 Discharge date: 04/29/2019  Time spent: 40 minutes  Recommendations for Outpatient Follow-up:  1. Follow outpatient CBC/CMP 2. Follow up with interventional radiology in [place] - see below for contact info 3. 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 Lovenox to continue for additional 25 days -> he'll need prescription for oral anticoagulant after this.  Will allow him to follow up with oncology for this prescription (he understands importance of this).  Given malignancy, would recommend indefinite anticoagulation at this time, would follow with oncology for additional recommendations. 5. Aspirin should be continued for 90 days per IR 6. Follow urology outpatient for his R sided hydro 7. Follow outpatient for pleural effusions, L supraclavicular LN 8. Follow anemia outpatient -> Iron, B12, folate deficiency -> discharged with supplementation - consider IM B12 injections 9. Ileus is improving, follow outpatient   Discharge Diagnoses:  Principal Problem:   DVT (deep venous thrombosis) (HCC) Active Problems:   H/O testicular cancer   Lymphadenopathy, retroperitoneal   Seminoma ([place])   Acute deep vein thrombosis (DVT) of left lower extremity (HCC)   Elevated serum creatinine   Abdominal distension   Abscess after procedure   Fever   Ileus ([place])  Discharge Condition: stable  Diet recommendation: heart healthy  Filed Weights   04/10/19 2005 04/18/19 0600  Weight: 90.7 kg 98.4 kg    History of present illness:  Timothy [name] has Landyn Buckalew history of stage Ib seminoma testicular followed at [facility] s/p right orchiectomy.  He lives in [place] and traveled to [place] to visit his mother.  He was admitted to [facility] long due to acute LLE DVT and found to  have recurrent cancer. Pt was transferred to [place] for mechanical thrombolysis of LLE DVT, hospital course complicated by retroperitoneal hematoma, psoas hematoma, anemia, ileus, AKI, new mild right hydronephrosis presumably from external compression, fever of unknown origin on empirical abx due to underline immunosuppressed status. Pt was transferred back to [place] per oncology request to start chemo and has now completed round of cisplatin and etoposide.  Plan to discharge 2/13 with follow up in [place] with IR and oncology.  See below for further details  Hospital Course:  Left lower extremity DVT s/p mechanical and pharmacological thrombolysis with stenting of the IVC for resistant malignant narrowing (IR Dr Earleen Newport on 1/31) Notedbulky retroperitoneal lymphadenopathy and IVC occlusionin the setting of testicular cancer S/P heparin gtt, transition to [place] therapeutic lovenox Long term anticoagulation plan:  - Lovenox to be continued for 30 days, then transition to oral anticoagulation of managing physician's choice (will defer prescription of oral anticoagulation to his oncologist when he follows up in [place] - discussed with pt importance of not missing any lovenox doses and not allowing anticoagulation to be interrupted) - 90 days of 81 mg ASA daily (start aspirin) - Bilateral LE compression stockings, at least knee high  - will need copy of medical records to facilitate care on discharge Per Dr. Stephenie Acres. Lavon Paganini, Assistant Professor of Radiology, has kindly agreed to see Timothy [name] in continuity of his current vascular care, when Timothy [name] returns home Scheduling: [place]: [phone] Dr. Ottis Stain has also kindly arranged follow-up with the Medical Oncology Department of [facility], and Timothy Vanvranken should have Hoyle Barkdull pending office appointment  upon arrival  Recurrent testicular cancer  Metastatic Seminoma Noted retroperitoneal  lymphoadenopathy Oncology Dr Burr Medico on board, started chemo on 04/24/19 (cisplatin/etoposide), completed 2/12 Follow with Dr. Coralee North on Thursday, 05/11/19 at 9 am at [facility]  Fever of unknown origin/leukocytosis Last febrile spike, low gradeon 04/24/2019, 100.30F, leukocytosis is persistent, improving today BC/UC are negative Likely 2/2 recurrent malignancy Echo normal ejection fraction, no evidence of valvular infection Repeat chest x-rayshowed faint bilateral lower lung field atelectasis or developing infiltrate(likely atelectasis due to significant abdominal distention,also has been on cefepime for about Delita Chiquito week now) Will d/c abx as suspect fever is related to malignancy  Sinus tachycardia Likely reactive - now improved CTA chest, no PE noted, although motion artifact TSH 6.484, free T41.09 Decision was made to DC telemetry for now as patient needs to start chemotherapy on the sixth floor, of which has no telemetry beds Monitor closely -> resolved  Hypokalemia Replace and follow  AKI/ new onset urinary retention /mild right hydro Cr peaked at 1.46,trended back to normal Urology input appreciated - note by Dr. Louis Meckel on 2/7, no recommendation for any urologic intervention at this time Daily CMP  Normocytic anemia  Iron Deficiency  B12 and Folate Deficiency Hemoglobin trending down Multifactorial, from acute illness, psoas hematoma, hematuria, malignancy S/p 2 U of PRBC Hb appropriate today, follow outpatient Daily CBC Iron, b12, folate supplementation  Ileus Noted abdominal distention,likely 2/2 significant narcotic use(adjusted narcotic dosage) Havingloose BM Repeat x-ray abdomen - c/w ileus X ray 2/12 with improving ileus   Procedures: CT biopsy Mechanical thrombolysis and stenting of IVC  Echo IMPRESSIONS    1. Left ventricular ejection fraction, by visual estimation, is 60 to  65%. The left ventricle has normal function. Mildly  increased left  ventricular posterior wall thickness. There is mildly increased left  ventricular hypertrophy.  2. Global right ventricle has normal systolic function.The right  ventricular size is normal. Right vetricular wall thickness was not  assessed.  3. The mitral valve is normal in structure. No evidence of mitral valve  regurgitation. No evidence of mitral stenosis.  4. The tricuspid valve is normal in structure. Tricuspid valve  regurgitation is not demonstrated.  5. The aortic valve is normal in structure. Aortic valve regurgitation is  not visualized. No evidence of aortic valve sclerosis or stenosis.  6. The inferior vena cava is normal in size with greater than 50%  respiratory variability, suggesting right atrial pressure of 3 mmHg.  7. No evidence of valvular vegetations on this transthoracic  echocardiogram, however acoustic windows are suboptimal.   LE [place] Summary:  Right: There is no evidence of deep vein thrombosis in the lower  extremity. No cystic structure found in the popliteal fossa.  Left: Findings consistent with acute deep vein thrombosis involving the  left external iliac vein, common femoral vein, left femoral vein, left  popliteal vein, and left peroneal veins. No cystic structure found in the  popliteal fossa. Unable to visualize  CIV, or IVC due to bowel gas.  Consultations:  Oncology  Urology  IR  Critical Care  Discharge Exam: Vitals:   04/28/19 2101 04/29/19 0658  BP: 127/79 120/82  Pulse: 79 73  Resp: 18 16  Temp: 97.9 F (36.6 C) 97.6 F (36.4 C)  SpO2: 99% 98%   Feels well, ready for discharge No new complaints Discussed d/c plan and recommendations  General: No acute distress. Cardiovascular: Heart sounds show Kamariya Blevens regular rate, and rhythm. Lungs: Clear to auscultation bilaterally  Abdomen: Soft, nontender, distended Neurological: Alert and oriented 3. Moves all extremities 4. Cranial nerves II through XII grossly  intact. Skin: Warm and dry. No rashes or lesions. Extremities: bilateral LE edema  Discharge Instructions   Discharge Instructions    Diet - low sodium heart healthy   Complete by: As directed    Discharge instructions   Complete by: As directed    You were seen for recurrent cancer and [place] Rosamond left lower extremity DVT (blood clot).  Your DVT was treated with mechanical and pharmacological thrombolysis with stenting of the IVC for resistant malignant narrowing.  For your DVT, you'll be discharged with an additional 25 days of lovenox to take twice daily.  After your lovenox is completed, you'll need to transition to another oral anticoagulant (please follow up with oncology for this - your anticoagulation should NOT be interrupted).  You should continue aspirin for 90 days.  Keep your legs elevated and use compression stockings to help with swelling.  Follow up with Dr. Lavon Paganini from Radiology when you get home.  Call [phone] for scheduling.  The [place] office number is [phone].  For your cancer, you were seen by oncology.  You received chemotherapy which completed on 2/12.  You should follow up with oncology at [facility].  You will see Dr. Coralee North on 05/11/2019 at 9 AM (arrive 30-45 minutes early to register).  Appointment will be in the [place] building and the address is [address] [place] [address].  You have iron, B12, and folate deficiency.  We'll send you with supplementation of each of these.  Ask your oncologist if you need to continue any B12 injections when you get back to see them.  Follow up with urology for your hydronephrosis.    Follow up with oncology for your pleural effusions.  Return for new, recurrent, or worsening symptoms.  Please ask your PCP to request records from this hospitalization so they know what was done and what the next steps will be.   Increase activity slowly   Complete by: As directed      Allergies as of 04/29/2019    No Known Allergies     Medication List    STOP taking these medications   ibuprofen 600 MG tablet Commonly known as: ADVIL     TAKE these medications   aspirin 81 MG chewable tablet Chew 1 tablet (81 mg total) by mouth daily.   cyclobenzaprine 10 MG tablet Commonly known as: FLEXERIL Take 1 tablet (10 mg total) by mouth 3 (three) times daily as needed for muscle spasms.   enoxaparin 100 MG/ML injection Commonly known as: LOVENOX Inject 1 mL (100 mg total) into the skin every 12 (twelve) hours for 25 days.   ferrous sulfate 325 (65 FE) MG tablet Take 1 tablet (325 mg total) by mouth daily with breakfast.   folic acid 1 MG tablet Commonly known as: FOLVITE Take 1 tablet (1 mg total) by mouth daily.   multivitamin with minerals Tabs tablet Take 1 tablet by mouth daily.   pantoprazole 40 MG tablet Commonly known as: Protonix Take 1 tablet (40 mg total) by mouth daily.   polyethylene glycol 17 g packet Commonly known as: MIRALAX / GLYCOLAX Take 17 g by mouth daily.   vitamin B-12 1000 MCG tablet Commonly known as: CYANOCOBALAMIN Take 1 tablet (1,000 mcg total) by mouth daily.            Durable Medical Equipment  (From admission,  onward)         Start     Ordered   04/29/19 1011  DME Walker  Once    Question Answer Comment  Walker: With 5 Inch Wheels   Patient needs Maudene Stotler walker to treat with the following condition Physical deconditioning      04/29/19 1022   04/29/19 1011  DME Cane  Once     04/29/19 1022         No Known Allergies    The results of significant diagnostics from this hospitalization (including imaging, microbiology, ancillary and laboratory) are listed below for reference.    Significant Diagnostic Studies: CT ABDOMEN PELVIS WO CONTRAST  Result Date: 04/22/2019 CLINICAL DATA:  Fever, abdominal distension, testicular cancer, status post right orchiectomy EXAM: CT ABDOMEN AND PELVIS WITHOUT CONTRAST TECHNIQUE: Multidetector CT  imaging of the abdomen and pelvis was performed following the standard protocol without IV contrast. COMPARISON:  Abdominal radiographs, 04/21/2019, CT abdomen pelvis, 04/17/2018, 04/11/2019 FINDINGS: Lower chest: Unchanged small bilateral pleural effusions and associated atelectasis or consolidation. Hepatobiliary: No solid liver abnormality is seen. No gallstones, gallbladder wall thickening, or biliary dilatation. Pancreas: Unremarkable. No pancreatic ductal dilatation or surrounding inflammatory changes. Spleen: Normal in size without significant abnormality. Adrenals/Urinary Tract: Adrenal glands are unremarkable. Mild right hydronephrosis, slightly increased compared to prior examination. Foley catheter in the urinary bladder. Stomach/Bowel: Stomach is within normal limits. The colon remains diffusely gas and fluid-filled to the rectum, caliber of the colon slightly increased, measuring up to 9 cm in the transverse colon. Sigmoid diverticulosis. Vascular/Lymphatic: Stent of the infrarenal IVC. Unchanged bulky retroperitoneal lymphadenopathy, largest left retroperitoneal nodes measuring up to 3.5 x 2.8 cm (series 3, image 60). Reproductive: No mass or other significant abnormality. Other: Anasarca. Retroperitoneal hematomas in the left psoas (series 3, image 57) and in the retroperitoneal fat posterior to the right psoas (series 3, image 66), not significantly changed. Musculoskeletal: No acute or significant osseous findings. IMPRESSION: 1. The colon remains diffusely gas and fluid-filled to the rectum, slightly increased in caliber compared to prior examination. Findings favor ileus. No specific findings to suggest infectious or inflammatory colitis. 2. Retroperitoneal hematomas in the left psoas and retroperitoneal fat posterior to the right psoas are not significantly changed. 3. Unchanged bulky retroperitoneal lymphadenopathy. 4. Mild right hydronephrosis, slightly increased compared to prior examination,  the mid to distal ureter presumably obstructed by adjacent lymph nodes. 5. Pleural effusions and anasarca. 6. Status post right orchiectomy. Electronically Signed   By: Eddie Candle M.D.   On: 04/22/2019 13:21   DG Abd 1 View  Result Date: 04/28/2019 CLINICAL DATA:  Ileus. EXAM: ABDOMEN - 1 VIEW COMPARISON:  Radiograph 04/25/2019, CT 04/22/2019 FINDINGS: Gaseous colonic distension of the ascending, transverse, and to Nolon Yellin lesser extent descending colon. There is been slight improvement from prior radiograph. Improved small bowel distension. IVC stent again seen. No evidence of free air. IMPRESSION: Improving small bowel dilatation with improving gaseous colonic distension, suggesting improving ileus. Electronically Signed   By: Keith Rake M.D.   On: 04/28/2019 22:11   DG Abd 1 View  Result Date: 04/21/2019 CLINICAL DATA:  Ileus, history of IVC stent placement EXAM: ABDOMEN - 1 VIEW COMPARISON:  04/20/2019 FINDINGS: Supine frontal view of the abdomen and pelvis excludes the left flank and lower pelvis by collimation. There is progressive gaseous distension of large and small bowel. IVC stent unchanged. No masses or abnormal calcifications. IMPRESSION: Progressive gaseous distension of large and small bowel consistent with ileus.  Electronically Signed   By: Randa Ngo M.D.   On: 04/21/2019 08:42   DG Abd 1 View  Result Date: 04/20/2019 CLINICAL DATA:  Diffuse abdominal tenderness, abdominal distension EXAM: ABDOMEN - 1 VIEW COMPARISON:  04/19/2019 FINDINGS: 2 supine frontal views of the abdomen and pelvis demonstrate persistent diffuse gaseous distention of the colon likely postoperative ileus. No masses or abnormal calcifications. Venous stent again noted. IMPRESSION: 1. Continued postoperative ileus. Electronically Signed   By: Randa Ngo M.D.   On: 04/20/2019 15:10   DG Abd 1 View  Result Date: 04/19/2019 CLINICAL DATA:  Evaluate ileus. EXAM: ABDOMEN - 1 VIEW COMPARISON:  April 19, 2019  (acquired at 8:04 Mykiah Schmuck.m. FINDINGS: Numerous dilated air-filled loops of large bowel are again seen. This is unchanged in appearance when compared to the prior study. The visualized small bowel loops are normal in caliber. Yanina Knupp radiopaque stent is seen overlying the medial aspect of the mid right abdomen. No radio-opaque calculi or other significant radiographic abnormality are seen. IMPRESSION: 1. Stable findings likely consistent with Srihitha Tagliaferri postoperative ileus, unchanged in appearance when compared to the prior study dated April 19, 2019 (acquired at 8:04 Loann Chahal.m.). Electronically Signed   By: Virgina Norfolk M.D.   On: 04/19/2019 20:28   DG Abd 1 View  Result Date: 04/19/2019 CLINICAL DATA:  Generalized abdominal pain, postoperative ileus. EXAM: ABDOMEN - 1 VIEW COMPARISON:  April 18, 2019. FINDINGS: Stable dilated air-filled colon is noted most consistent with postoperative ileus. No significant small bowel dilatation is noted. Phleboliths are noted in the pelvis. IVC stent is again noted. IMPRESSION: Stable dilated air-filled colon is noted most consistent with postoperative ileus. Electronically Signed   By: Marijo Conception M.D.   On: 04/19/2019 09:14   CT ANGIO CHEST PE W OR WO CONTRAST  Result Date: 04/19/2019 CLINICAL DATA:  Hypoxia. EXAM: CT ANGIOGRAPHY CHEST WITH CONTRAST TECHNIQUE: Multidetector CT imaging of the chest was performed using the standard protocol during bolus administration of intravenous contrast. Multiplanar CT image reconstructions and MIPs were obtained to evaluate the vascular anatomy. CONTRAST:  181m OMNIPAQUE IOHEXOL 300 MG/ML SOLN, 866mOMNIPAQUE IOHEXOL 350 MG/ML SOLN COMPARISON:  04/11/2019 FINDINGS: Cardiovascular: Evaluation for acute pulmonary emboli is severely limited by respiratory motion artifact and suboptimal contrast bolus timing. Given these limitations, no large centrally located pulmonary embolism was detected.Detection of smaller pulmonary emboli is not possible on  this exam. The main pulmonary artery is within normal limits for size. There is no CT evidence of acute right heart strain. The visualized aorta is normal. Heart size is normal, without pericardial effusion. Mediastinum/Nodes: --No mediastinal or hilar lymphadenopathy. --No axillary lymphadenopathy. --there is an enlarged left supraclavicular lymph node measuring 1.4 cm (axial series 5, image 5). --Normal thyroid gland. --The esophagus is unremarkable Lungs/Pleura: There is atelectasis at the lung bases bilaterally. There are new small bilateral pleural effusions, left greater than right. Upper Abdomen: No acute abnormality. Musculoskeletal: No chest wall abnormality. No acute or significant osseous findings. Review of the MIP images confirms the above findings. IMPRESSION: 1. Evaluation for acute pulmonary emboli is severely limited by respiratory motion artifact and suboptimal contrast bolus timing. Given these limitations, no large centrally located pulmonary embolism was detected. 2. New small bilateral pleural effusions, left greater than right. 3. Enlarged left supraclavicular lymph node measuring 1.4 cm. This may be malignant or reactive in etiology. It is amenable to percutaneous biopsy as clinically indicated. Electronically Signed   By: ChJamie Kato.  On: 04/19/2019 23:17   CT Angio Chest PE W and/or Wo Contrast  Result Date: 04/11/2019 CLINICAL DATA:  Positive lower extremity DVT, PE suspected. Back pain and leg swelling, history of testicular cancer. EXAM: CT ANGIOGRAPHY CHEST CT ABDOMEN AND PELVIS WITH CONTRAST TECHNIQUE: Multidetector CT imaging of the chest was performed using the standard protocol during bolus administration of intravenous contrast. Multiplanar CT image reconstructions and MIPs were obtained to evaluate the vascular anatomy. Multidetector CT imaging of the abdomen and pelvis was performed using the standard protocol during bolus administration of intravenous contrast.  CONTRAST:  138m OMNIPAQUE IOHEXOL 350 MG/ML SOLN COMPARISON:  None. FINDINGS: CTA CHEST FINDINGS Cardiovascular: Suboptimal opacification of the pulmonary arteries limits evaluation beyond the lobar level. No large central or lobar pulmonary arterial filling defects are identified. Central pulmonary arteries are normal caliber. Normal cardiac size. Slight mass effect on the right heart by Khoen Genet mild pectus deformity of the chest wall with Courtnee Myer Haller index of 3.2. The aorta is normal caliber. Shared origin of the brachiocephalic and left common carotid arteries. Great vessels are otherwise unremarkable. Mediastinum/Nodes: No enlarged mediastinal, hilar or axillary adenopathy is seen. No acute abnormality of the trachea or esophagus. Thyroid gland is normal. Asymmetric thickening of the left sternocleidomastoid is likely related to muscle activation. Lungs/Pleura: Respiratory motion artifact is mild. No consolidation, features of edema, pneumothorax, or effusion. No suspicious pulmonary nodules or masses. Musculoskeletal: No acute osseous abnormality or suspicious osseous lesion in the imaged chest. No worrisome chest wall lesions. Mild bilateral gynecomastia is noted, right slightly greater left. Review of the MIP images confirms the above findings. CT ABDOMEN and PELVIS FINDINGS Hepatobiliary: No focal liver abnormality is seen. No gallstones, gallbladder wall thickening, or biliary dilatation. Pancreas: Unremarkable. No pancreatic ductal dilatation or surrounding inflammatory changes. Spleen: Normal in size without focal abnormality. Adrenals/Urinary Tract: Adrenal glands are unremarkable. Kidneys are normal, without renal calculi, focal lesion, or hydronephrosis. Bladder is unremarkable. Stomach/Bowel: Distal esophagus, stomach and duodenal sweep are unremarkable. No small bowel wall thickening or dilatation. No evidence of obstruction. Edmonia Gonser normal appendix is visualized. No colonic dilatation or wall thickening.  Scattered colonic diverticula without focal pericolonic inflammation to suggest diverticulitis. Vascular/Lymphatic: There is extensive retroperitoneal and pelvic adenopathy, some of the larger nodes include Radie Berges left periaortic node measuring up to 3.2 cm (2/43) and Jeannemarie Sawaya 2 cm aortocaval lymph node (2/35) there is distal abdominal aortic encasement by Nelida Mandarino extensive retroperitoneal adenopathy. Furthermore there is likely direct compression and/or direct nodal involvement of the inferior vena cava (2/40 with the lower IVC appearing expanded by hypoattenuating filling defects. Edematous changes are noted throughout the pelvis with venous engorgement and collateralization. Reproductive: The prostate and seminal vesicles are unremarkable. There is Ruby Logiudice varicocele seen in the left scrotum. External genitalia are incompletely included on this exam. Other: There is circumferential body wall edema from the level of the pelvis inferiorly more pronounced throughout the left lower extremity. Free fluid is seen in the deep pelvis, likely reactive or secondary to venous occlusion with developing collateralization. Musculoskeletal: No acute osseous abnormality or suspicious osseous lesion. Normal symmetric muscular enhancement. Review of the MIP images confirms the above findings. IMPRESSION: 1. Suboptimal opacification of the pulmonary arteries limits evaluation beyond the lobar level. No large central or lobar pulmonary arterial filling defects are identified. 2. Extensive retroperitoneal and pelvic adenopathy. There is likely direct compression of the lower IVC with the distal IVC and iliac veins appearing expanded by hypoattenuating thrombus. There is complete encasement of  the abdominal aorta as well but without luminal narrowing. Findings are highly concerning for metastatic disease, particularly given patient history of testicular cancer. 3. Left scrotal varicocele. External genitalia are incompletely included on this exam. Consider  further evaluation scrotal ultrasound given extensive retroperitoneal adenopathy and patient's history of testicular cancer. 4. Free fluid in the deep pelvis is likely reactive or secondary to venous occlusion with developing collateralization. 5. Colonic diverticulosis without evidence of diverticulitis. 6. Mild bilateral gynecomastia, right slightly greater left. 7. Mild mass effect on the right heart by Tyreece Gelles mild pectus deformity of the chest wall with Josilyn Shippee Haller index of 3.2. Critical Value/emergent results were called by telephone at the time of interpretation on 04/11/2019 at 1:33 am to [place] , who verbally acknowledged these results. Electronically Signed   By: Lovena Le M.D.   On: 04/11/2019 01:33   [facility] BRAIN W WO CONTRAST  Result Date: 04/15/2019 CLINICAL DATA:  History of testicular cancer. Assess for metastatic disease. EXAM: MRI HEAD WITHOUT AND WITH CONTRAST TECHNIQUE: Multiplanar, multiecho pulse sequences of the brain and surrounding structures were obtained without and with intravenous contrast. CONTRAST:  12m GADAVIST GADOBUTROL 1 MMOL/ML IV SOLN COMPARISON:  None. FINDINGS: Brain: The brain has Trei Schoch normal appearance without evidence of malformation, atrophy, old or acute small or large vessel infarction, mass lesion, hemorrhage, hydrocephalus or extra-axial collection. After contrast administration, no abnormal enhancement occurs. Vascular: Major vessels at the base of the brain show flow. Venous sinuses appear patent. Skull and upper cervical spine: Normal. Sinuses/Orbits: Clear/normal. Other: None significant. IMPRESSION: Normal examination.  No evidence of metastatic disease. Electronically Signed   By: MNelson ChimesM.D.   On: 04/15/2019 11:07   UKoreaScrotum  Result Date: 04/11/2019 CLINICAL DATA:  Concern for testicular cancer EXAM: ULTRASOUND OF SCROTUM TECHNIQUE: Complete ultrasound examination of the testicles, epididymis, and other scrotal structures was performed. COMPARISON:   None. FINDINGS: Right testicle Measurements: Surgically removed. Left testicle Measurements: 3.9 x 2.2 x 2.6 cm. No mass or microlithiasis visualized. Right epididymis:  Not visualized Left epididymis:  Normal in size and appearance. Hydrocele:  Moderate left hydrocele Varicocele:  Moderate left varicocele. IMPRESSION: No left testicular abnormality.  Prior right orchiectomy. Moderate left hydrocele and varicocele. Electronically Signed   By: KRolm BaptiseM.D.   On: 04/11/2019 02:16   CT ABDOMEN PELVIS W CONTRAST  Result Date: 04/18/2019 CLINICAL DATA:  Testicular carcinoma post RIGHT orchiectomy, LEFT lower extremity DVT, abdominal distension, recent CT with intra-abdominal and retroperitoneal lymphadenopathy concerning for metastatic cancer EXAM: CT ABDOMEN AND PELVIS WITH CONTRAST TECHNIQUE: Multidetector CT imaging of the abdomen and pelvis was performed using the standard protocol following bolus administration of intravenous contrast. Sagittal and coronal MPR images reconstructed from axial data set. CONTRAST:  1052mOMNIPAQUE IOHEXOL 300 MG/ML SOLN IV. No oral contrast. COMPARISON:  04/11/2019 FINDINGS: Lower chest: Dependent bibasilar atelectasis in the lower lobes Hepatobiliary: New dependent density within gallbladder question vicarious excretion of contrast material from prior CT exam versus sludge. No gallbladder wall thickening. Liver unremarkable. Pancreas: Normal appearance Spleen: Normal appearance Adrenals/Urinary Tract: Adrenal glands, kidneys, and ureters normal appearance. Foley catheter and air within urinary bladder. Stomach/Bowel: Retrocecal appendix, normal. Gaseous distention of colon from cecum to descending colon. Small amount of stool in fluid within proximal colon. Tapering of the distal colon with few scattered distal colonic diverticula but no evidence of diverticulitis. Rectosigmoid colon decompressed. No discrete point of obstruction seen; findings favor ileus. Stomach and small  bowel loops unremarkable  Vascular/Lymphatic: Prior stenting of the IVC. Aorta normal caliber without aneurysm. IVC, iliac veins, common femoral veins patent without thrombus. Previously identified thrombus LEFT external iliac vein no longer seen. Again identified significant retroperitoneal adenopathy unchanged. Reproductive: Seminal vesicles unremarkable. Minimal prostatic prominence for age. Other: Scattered stranding of retroperitoneal tissue planes increased from previous exam. No free air or free fluid. Tiny LEFT inguinal hernia containing fat Musculoskeletal: Osseous structures unremarkable. New enlargement of the LEFT psoas muscle with in ill-defined area of central low attenuation approximately 3.7 x 3.9 cm in axial dimensions extending 12 cm length, could represent LEFT psoas abscess or hematoma. This is new since 04/11/2019 making tumor infiltration unlikely. IMPRESSION: Mild gaseous distention of the colon through descending colon with decompressed rectosigmoid colon; no point of obstruction is identified, favor ileus. Persistent retroperitoneal adenopathy suspicious for metastatic disease. New ill-defined fluid collection within LEFT psoas muscle 3.7 x 3.9 x 12 cm question psoas abscess versus hematoma; LEFT psoas muscle was traversed by prior CT guided retroperitoneal lymph node biopsy. Increased retroperitoneal and mesenteric stranding. Bibasilar atelectasis. Findings called to Dr. Zigmund Daniel on 04/18/2019 at 1108 hours. Electronically Signed   By: Lavonia Dana M.D.   On: 04/18/2019 11:08   CT ABDOMEN PELVIS W CONTRAST  Result Date: 04/11/2019 CLINICAL DATA:  Positive lower extremity DVT, PE suspected. Back pain and leg swelling, history of testicular cancer. EXAM: CT ANGIOGRAPHY CHEST CT ABDOMEN AND PELVIS WITH CONTRAST TECHNIQUE: Multidetector CT imaging of the chest was performed using the standard protocol during bolus administration of intravenous contrast. Multiplanar CT image reconstructions  and MIPs were obtained to evaluate the vascular anatomy. Multidetector CT imaging of the abdomen and pelvis was performed using the standard protocol during bolus administration of intravenous contrast. CONTRAST:  156m OMNIPAQUE IOHEXOL 350 MG/ML SOLN COMPARISON:  None. FINDINGS: CTA CHEST FINDINGS Cardiovascular: Suboptimal opacification of the pulmonary arteries limits evaluation beyond the lobar level. No large central or lobar pulmonary arterial filling defects are identified. Central pulmonary arteries are normal caliber. Normal cardiac size. Slight mass effect on the right heart by Sayyid Harewood mild pectus deformity of the chest wall with Aicha Clingenpeel Haller index of 3.2. The aorta is normal caliber. Shared origin of the brachiocephalic and left common carotid arteries. Great vessels are otherwise unremarkable. Mediastinum/Nodes: No enlarged mediastinal, hilar or axillary adenopathy is seen. No acute abnormality of the trachea or esophagus. Thyroid gland is normal. Asymmetric thickening of the left sternocleidomastoid is likely related to muscle activation. Lungs/Pleura: Respiratory motion artifact is mild. No consolidation, features of edema, pneumothorax, or effusion. No suspicious pulmonary nodules or masses. Musculoskeletal: No acute osseous abnormality or suspicious osseous lesion in the imaged chest. No worrisome chest wall lesions. Mild bilateral gynecomastia is noted, right slightly greater left. Review of the MIP images confirms the above findings. CT ABDOMEN and PELVIS FINDINGS Hepatobiliary: No focal liver abnormality is seen. No gallstones, gallbladder wall thickening, or biliary dilatation. Pancreas: Unremarkable. No pancreatic ductal dilatation or surrounding inflammatory changes. Spleen: Normal in size without focal abnormality. Adrenals/Urinary Tract: Adrenal glands are unremarkable. Kidneys are normal, without renal calculi, focal lesion, or hydronephrosis. Bladder is unremarkable. Stomach/Bowel: Distal esophagus,  stomach and duodenal sweep are unremarkable. No small bowel wall thickening or dilatation. No evidence of obstruction. Dailon Sheeran normal appendix is visualized. No colonic dilatation or wall thickening. Scattered colonic diverticula without focal pericolonic inflammation to suggest diverticulitis. Vascular/Lymphatic: There is extensive retroperitoneal and pelvic adenopathy, some of the larger nodes include Anaijah Augsburger left periaortic node measuring up to 3.2 cm (  2/43) and Romie Tay 2 cm aortocaval lymph node (2/35) there is distal abdominal aortic encasement by Eletha Culbertson extensive retroperitoneal adenopathy. Furthermore there is likely direct compression and/or direct nodal involvement of the inferior vena cava (2/40 with the lower IVC appearing expanded by hypoattenuating filling defects. Edematous changes are noted throughout the pelvis with venous engorgement and collateralization. Reproductive: The prostate and seminal vesicles are unremarkable. There is Wenceslaus Gist varicocele seen in the left scrotum. External genitalia are incompletely included on this exam. Other: There is circumferential body wall edema from the level of the pelvis inferiorly more pronounced throughout the left lower extremity. Free fluid is seen in the deep pelvis, likely reactive or secondary to venous occlusion with developing collateralization. Musculoskeletal: No acute osseous abnormality or suspicious osseous lesion. Normal symmetric muscular enhancement. Review of the MIP images confirms the above findings. IMPRESSION: 1. Suboptimal opacification of the pulmonary arteries limits evaluation beyond the lobar level. No large central or lobar pulmonary arterial filling defects are identified. 2. Extensive retroperitoneal and pelvic adenopathy. There is likely direct compression of the lower IVC with the distal IVC and iliac veins appearing expanded by hypoattenuating thrombus. There is complete encasement of the abdominal aorta as well but without luminal narrowing. Findings are  highly concerning for metastatic disease, particularly given patient history of testicular cancer. 3. Left scrotal varicocele. External genitalia are incompletely included on this exam. Consider further evaluation scrotal ultrasound given extensive retroperitoneal adenopathy and patient's history of testicular cancer. 4. Free fluid in the deep pelvis is likely reactive or secondary to venous occlusion with developing collateralization. 5. Colonic diverticulosis without evidence of diverticulitis. 6. Mild bilateral gynecomastia, right slightly greater left. 7. 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 Critical Value/emergent results were called by telephone at the time of interpretation on 04/11/2019 at 1:33 am to [place] , who verbally acknowledged these results. Electronically Signed   By: Lovena Le M.D.   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 Patient has experienced minimal improvement since initiation of IV heparin, and as such request made for initiation of catheter directed thrombolysis for symptomatic purposes. EXAM: 1. ULTRASOUND GUIDANCE FOR VASCULAR ACCESS X2 2. FLUOROSCOPIC GUIDED PHARMACOLOGICAL AND MECHANICAL THROMBECTOMY WITH ANGIOJET DEVICE 3. BILATERAL LOWER EXTREMITY VENOGRAM AND PLACEMENT OF INFUSION CATHETERS COMPARISON:  CT chest, abdomen pelvis-04/11/2019; abdominal MRV - 04/13/2019 MEDICATIONS: Hydralazine 10 mg IV, administered near the completion of the procedure secondary to persistently elevated blood pressure. CONTRAST:  60 cc Omnipaque 300 ANESTHESIA/SEDATION: Moderate (conscious) sedation was employed during this procedure. Adelae Yodice total of Versed 3 mg and Fentanyl 125 mcg was  administered intravenously. Moderate Sedation Time: 67 minutes. The patient's level of consciousness and vital signs were monitored continuously by radiology nursing throughout the procedure under my direct supervision. FLUOROSCOPY TIME:  18 minutes, 18 seconds (518 mGy) COMPLICATIONS: None immediate. TECHNIQUE: Informed written consent was obtained from the patient after Chong Wojdyla discussion of the risks (including but not limited to bleeding, including nontarget bleeding given history of malignancy, infection, pulmonary embolism and necessity for venous angioplasty and/or stent placement), benefits and alternatives to treatment. Questions regarding the procedure were encouraged and answered. Khan Chura timeout was performed prior to the initiation of the procedure. The patient was placed prone on the  fluoroscopy table and the skin posterior to the bilateral knees was prepped and draped in the usual sterile fashion, and Josehua Hammar sterile drape was applied covering the operative field. Maximum barrier sterile technique with sterile gowns and gloves were used for the procedure. Under direct ultrasound guidance, the left popliteal vein was access with Osie Amparo micropuncture kit after the overlying soft tissues were anesthetized with 1% lidocaine. An ultrasound image was saved for documentation purposed. This allowed for placement of Jayden Kratochvil 8-French vascular sheath. Aubreigh Fuerte limited venogram was performed through the side arm of the vascular sheath. With the use of Arren Laminack regular glidewire, Effrey Davidow Kumpe catheter was advanced through the left femoral, external iliac vein and through the left common iliac vein to the level of the IVC. Limited vena cavogram demonstrates complete occlusion of the distal aspect of the IVC extending to involve both left common iliac veins. Ultimately, Shalen Petrak moderate to long segment apparently extrinsic malignant narrowing involving the mid/distal aspect of the IVC was successfully traversed with inferior venogram demonstrating patency of the more  cranial infrarenal IVC. Next, with the use of Dontrez Pettis Angiojet device, approximately 250 cc of Shirelle Tootle solution of 10 mg of tPA mixed in 500 mg of saline was infused throughout the occluded segment of the IVC, left common and external iliac veins to the level of the left common femoral vein. Attention was now paid towards access of the right lower extremity popliteal vein. After the overlying soft tissues were anesthetized with 1% lidocaine, the right popliteal vein was accessed micropuncture kit. Limited venogram was performed through the outer micro puncture sheath. This allowed for placement of Laquanda Bick 6 French, 35 cm vascular sheath. Again, with the use of Jadda Hunsucker regular glidewire, Pavel Gadd Kumpe catheter was advanced beyond the obstructing caval lesion. Contrast injection confirmed appropriate positioning. Next, after approximately 20 minute tPA dwell, mechanical thrombectomy was performed with the Angiojet device both from the left lower extremity access as well as the right lower extremity access. Limited vena cavagram was performed via the right lower extremity access. Kumpe catheter was utilized for measurement purposes and ultimately Shatasia Cutshaw 90 cm/20-cm infusion catheter was placed from the right lower extremity access and Dominico Rod 90 cm/30 cm infusion catheter was placed from the left lower extremity access adequately covering the occluded segment of the bilateral venous systems as well as the caudal aspect of the IVC. Postprocedural spot fluoroscopic images were obtained. Both popliteal vascular sheath were secured in place. Dressings were applied. Patient's was subjectively cold by the end of the procedure with associated elevation in his blood pressure and as such he was administered 10 mg of hydralazine and bear hugger warming device was applied. The patient otherwise tolerated the procedure well without immediate postprocedural complication. FINDINGS: Sonographic evaluation demonstrates patency of the bilateral popliteal veins. Contrast  injection demonstrates patency of the bilateral popliteal and superficial femoral veins. There is occlusive thrombus extending from the left common femoral vein and the right common iliac vein through the mid/caudal aspect of the IVC secondary to Terel Bann moderate to long segment length presumably extrinsic malignant occlusion secondary to known adjacent bulky retroperitoneal lymphadenopathy. Following pharmacological and mechanical thrombectomy as detailed above, there is restored flow as demonstrated on limited right central pelvic venogram with large amount of residual thrombus. Following infusion catheter placement, both side ports traverse both occluded segments of the IVC and pelvic venous systems. IMPRESSION: 1. 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 2. Successful initiation of bilateral mechanical and pharmacologic thrombectomy with bilateral infusion catheters appropriately positioned. PLAN: The patient will be transferred to ICU for overnight lytic infusion and will return tomorrow for repeat venogram and potential intervention. Electronically Signed   By: Sandi Mariscal M.D.   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 Patient has experienced minimal improvement since initiation of IV heparin, and as such request made for initiation of catheter directed thrombolysis for symptomatic purposes. EXAM: 1. ULTRASOUND GUIDANCE FOR VASCULAR ACCESS X2 2. FLUOROSCOPIC GUIDED PHARMACOLOGICAL AND MECHANICAL THROMBECTOMY WITH ANGIOJET DEVICE 3. BILATERAL LOWER EXTREMITY VENOGRAM AND PLACEMENT OF INFUSION CATHETERS COMPARISON:  CT chest, abdomen pelvis-04/11/2019; abdominal MRV  - 04/13/2019 MEDICATIONS: Hydralazine 10 mg IV, administered near the completion of the procedure secondary to persistently elevated blood pressure. CONTRAST:  60 cc Omnipaque 300 ANESTHESIA/SEDATION: Moderate (conscious) sedation was employed during this procedure. Riggin Cuttino total of Versed 3 mg and Fentanyl 125 mcg was administered intravenously. Moderate Sedation Time: 67 minutes. The patient's level of consciousness and vital signs were monitored continuously by radiology nursing throughout the procedure under my direct supervision. FLUOROSCOPY TIME:  18 minutes, 18 seconds (408 mGy) COMPLICATIONS: None immediate. TECHNIQUE: Informed written consent was obtained from the patient after Ted Goodner discussion of the risks (including but not limited to bleeding, including nontarget bleeding given history of malignancy, infection, pulmonary embolism and necessity for venous angioplasty and/or stent placement), benefits and alternatives to treatment. Questions regarding the procedure were encouraged and answered. Zailyn Thoennes timeout was performed prior to the initiation of the procedure. The patient was placed prone on the fluoroscopy table and the skin posterior to the bilateral knees was prepped and draped in the usual sterile fashion, and Natasia Sanko sterile drape was applied covering the operative field. Maximum barrier sterile technique with sterile gowns and gloves were used for the procedure. Under direct ultrasound guidance, the left popliteal vein was access with Trea Carnegie micropuncture kit after the overlying soft tissues were anesthetized with 1% lidocaine. An ultrasound image was saved for documentation purposed. This allowed for placement of Chianti Goh 8-French vascular sheath. Reino Lybbert limited venogram was performed through the side arm of the vascular sheath. With the use of Shay Bartoli regular glidewire, Alexandar Weisenberger Kumpe catheter was advanced through the left femoral, external iliac vein and through the left common iliac vein to the level of the IVC. Limited vena cavogram  demonstrates complete occlusion of the distal aspect of the IVC extending to involve both left common iliac veins. Ultimately, Bobbye Petti moderate to long segment apparently extrinsic malignant narrowing involving the mid/distal aspect of the IVC was successfully traversed with inferior venogram demonstrating patency of the more cranial infrarenal IVC. Next, with the use of Lorraina Spring Angiojet device, approximately 250 cc of Thamara Leger solution of 10 mg of tPA mixed in 500 mg of saline was infused throughout the occluded segment of the IVC, left common and external iliac veins to the level of the left common femoral vein. Attention was now paid towards access of the right lower extremity popliteal vein. After the overlying soft tissues were anesthetized with 1% lidocaine, the right popliteal vein was accessed micropuncture kit. Limited venogram was performed through the outer micro puncture sheath. This allowed for placement of Brayan Votaw 6 French, 35 cm vascular sheath. Again, with the use of Lucresha Dismuke regular glidewire, Johanthan Kneeland  Kumpe catheter was advanced beyond the obstructing caval lesion. Contrast injection confirmed appropriate positioning. Next, after approximately 20 minute tPA dwell, mechanical thrombectomy was performed with the Angiojet device both from the left lower extremity access as well as the right lower extremity access. Limited vena cavagram was performed via the right lower extremity access. Kumpe catheter was utilized for measurement purposes and ultimately Toshiko Kemler 90 cm/20-cm infusion catheter was placed from the right lower extremity access and Erique Kaser 90 cm/30 cm infusion catheter was placed from the left lower extremity access adequately covering the occluded segment of the bilateral venous systems as well as the caudal aspect of the IVC. Postprocedural spot fluoroscopic images were obtained. Both popliteal vascular sheath were secured in place. Dressings were applied. Patient's was subjectively cold by the end of the procedure with associated  elevation in his blood pressure and as such he was administered 10 mg of hydralazine and bear hugger warming device was applied. The patient otherwise tolerated the procedure well without immediate postprocedural complication. FINDINGS: Sonographic evaluation demonstrates patency of the bilateral popliteal veins. Contrast injection demonstrates patency of the bilateral popliteal and superficial femoral veins. There is occlusive thrombus extending from the left common femoral vein and the right common iliac vein through the mid/caudal aspect of the IVC secondary to Jadin Kagel moderate to long segment length presumably extrinsic malignant occlusion secondary to known adjacent bulky retroperitoneal lymphadenopathy. Following pharmacological and mechanical thrombectomy as detailed above, there is restored flow as demonstrated on limited right central pelvic venogram with large amount of residual thrombus. Following infusion catheter placement, both side ports traverse both occluded segments of the IVC and pelvic venous systems. IMPRESSION: 1. 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 2. Successful initiation of bilateral mechanical and pharmacologic thrombectomy with bilateral infusion catheters appropriately positioned. PLAN: The patient will be transferred to ICU for overnight lytic infusion and will return tomorrow for repeat venogram and potential intervention. Electronically Signed   By: Sandi Mariscal M.D.   On: 04/15/2019 17:04   US RENAL  Result Date: 04/22/2019 CLINICAL DATA:  Elevated serum creatinine level. EXAM: RENAL / URINARY TRACT ULTRASOUND COMPLETE COMPARISON:  April 17, 2019. FINDINGS: Right Kidney: Renal measurements: 13.9 x 6.4 x 6.3 cm = volume: 294 mL . Echogenicity within normal limits. No mass or hydronephrosis visualized. Left Kidney: Renal measurements: 12.2 x 6.3 x 6.2 cm = volume: 246  mL. Echogenicity within normal limits. No mass or hydronephrosis visualized. Bladder: Appears normal for degree of bladder distention. Foley catheter is noted. Other: None. IMPRESSION: No significant renal abnormality is noted. Electronically Signed   By: Marijo Conception M.D.   On: 04/22/2019 16:53   US RENAL  Result Date: 04/17/2019 CLINICAL DATA:  Urinary retention.  History of testicular carcinoma. EXAM: RENAL / URINARY TRACT ULTRASOUND COMPLETE COMPARISON:  CT scan 04/11/2019 FINDINGS: Right Kidney: Renal measurements: 13.5 x 5.5 x 6.6 cm = volume: 260.0 mL . Normal renal cortical thickness and echogenicity without focal lesions or hydronephrosis. Left Kidney: Renal measurements: 12.0 x 6.8 x 5.4 cm = volume: 229.4 mL. Normal renal cortical thickness and echogenicity without focal lesions or hydronephrosis. Bladder: Decompressed by Foley catheter. Other: None. IMPRESSION: Normal sonographic appearance of both kidneys.  No hydronephrosis. Bladder decompressed by Rosaisela Jamroz Foley catheter. Electronically Signed   By: Marijo Sanes M.D.   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  Patient has experienced minimal improvement since initiation of IV heparin, and as such request made for initiation of catheter directed thrombolysis for symptomatic purposes.  EXAM: 1. ULTRASOUND GUIDANCE FOR VASCULAR ACCESS X2 2. FLUOROSCOPIC GUIDED PHARMACOLOGICAL AND MECHANICAL THROMBECTOMY WITH ANGIOJET DEVICE 3. BILATERAL LOWER EXTREMITY VENOGRAM AND PLACEMENT OF INFUSION CATHETERS  COMPARISON:  CT chest, abdomen pelvis-04/11/2019; abdominal MRV - 04/13/2019  MEDICATIONS: Hydralazine 10 mg IV, administered near the completion of the procedure secondary to persistently elevated blood  pressure.  CONTRAST:  60 cc Omnipaque 300  ANESTHESIA/SEDATION: Moderate (conscious) sedation was employed during this procedure. Adalina Dopson total of Versed 3 mg and Fentanyl 125 mcg was administered intravenously.  Moderate Sedation Time: 67 minutes. The patient's level of consciousness and vital signs were monitored continuously by radiology nursing throughout the procedure under my direct supervision.  FLUOROSCOPY TIME:  18 minutes, 18 seconds (470 mGy)  COMPLICATIONS: None immediate.  TECHNIQUE: Informed written consent was obtained from the patient after Reesha Debes discussion of the risks (including but not limited to bleeding, including nontarget bleeding given history of malignancy, infection, pulmonary embolism and necessity for venous angioplasty and/or stent placement), benefits and alternatives to treatment. Questions regarding the procedure were encouraged and answered. Zanya Lindo timeout was performed prior to the initiation of the procedure.  The patient was placed prone on the fluoroscopy table and the skin posterior to the bilateral knees was prepped and draped in the usual sterile fashion, and Fumie Fiallo sterile drape was applied covering the operative field. Maximum barrier sterile technique with sterile gowns and gloves were used for the procedure.  Under direct ultrasound guidance, the left popliteal vein was access with Shylie Polo micropuncture kit after the overlying soft tissues were anesthetized with 1% lidocaine. An ultrasound image was saved for documentation purposed. This allowed for placement of Vega Withrow 8-French vascular sheath. Rashana Andrew limited venogram was performed through the side arm of the vascular sheath.  With the use of Maxamillian Tienda regular glidewire, Dondrell Loudermilk Kumpe catheter was advanced through the left femoral, external iliac vein and through the left common iliac vein to the level of the IVC.  Limited vena cavogram demonstrates complete occlusion of the distal aspect of the IVC extending to involve both left common iliac veins.   Ultimately, Emmanuel Ercole moderate to long segment apparently extrinsic malignant narrowing involving the mid/distal aspect of the IVC was successfully traversed with inferior venogram demonstrating patency of the more cranial infrarenal IVC.  Next, with the use of Toy Eisemann Angiojet device, approximately 250 cc of Vaida Kerchner solution of 10 mg of tPA mixed in 500 mg of saline was infused throughout the occluded segment of the IVC, left common and external iliac veins to the level of the left common femoral vein.  Attention was now paid towards access of the right lower extremity popliteal vein.  After the overlying soft tissues were anesthetized with 1% lidocaine, the right popliteal vein was accessed micropuncture kit. Limited venogram was performed through the outer micro puncture sheath. This allowed for placement of Brigetta Beckstrom 6 French, 35 cm vascular sheath.  Again, with the use of Geet Hosking regular glidewire, Jerryl Holzhauer Kumpe catheter was advanced beyond the obstructing caval lesion. Contrast injection confirmed appropriate positioning.  Next, after approximately 20 minute tPA dwell, mechanical thrombectomy was performed with the Angiojet device both from the left lower extremity access as well as the  right lower extremity access.  Limited vena cavagram was performed via the right lower extremity access.  Kumpe catheter was utilized for measurement purposes and ultimately Beautifull Cisar 90 cm/20-cm infusion catheter was placed from the right lower extremity access and Azul Coffie 90 cm/30 cm infusion catheter was placed from the left lower extremity access adequately covering the occluded segment of the bilateral venous systems as well as the caudal aspect of the IVC.  Postprocedural spot fluoroscopic images were obtained.  Both popliteal vascular sheath were secured in place. Dressings were applied.  Patient's was subjectively cold by the end of the procedure with associated elevation in his blood pressure and as such he was administered 10 mg of hydralazine and bear hugger  warming device was applied.  The patient otherwise tolerated the procedure well without immediate postprocedural complication.  FINDINGS: Sonographic evaluation demonstrates patency of the bilateral popliteal veins.  Contrast injection demonstrates patency of the bilateral popliteal and superficial femoral veins.  There is occlusive thrombus extending from the left common femoral vein and the right common iliac vein through the mid/caudal aspect of the IVC secondary to Loistine Eberlin moderate to long segment length presumably extrinsic malignant occlusion secondary to known adjacent bulky retroperitoneal lymphadenopathy.  Following pharmacological and mechanical thrombectomy as detailed above, there is restored flow as demonstrated on limited right central pelvic venogram with large amount of residual thrombus.  Following infusion catheter placement, both side ports traverse both occluded segments of the IVC and pelvic venous systems.  IMPRESSION: 1. 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 2. Successful initiation of bilateral mechanical and pharmacologic thrombectomy with bilateral infusion catheters appropriately positioned.  PLAN: The patient will be transferred to ICU for overnight lytic infusion and will return tomorrow for repeat venogram and potential intervention.   Electronically Signed   By: Sandi Mariscal M.D.   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 Patient has experienced minimal improvement since initiation of IV heparin, and as such request made for initiation of catheter directed thrombolysis for symptomatic purposes. EXAM: 1. ULTRASOUND  GUIDANCE FOR VASCULAR ACCESS X2 2. FLUOROSCOPIC GUIDED PHARMACOLOGICAL AND MECHANICAL THROMBECTOMY WITH ANGIOJET DEVICE 3. BILATERAL LOWER EXTREMITY VENOGRAM AND PLACEMENT OF INFUSION CATHETERS COMPARISON:  CT chest, abdomen pelvis-04/11/2019; abdominal MRV - 04/13/2019 MEDICATIONS: Hydralazine 10 mg IV, administered near the completion of the procedure secondary to persistently elevated blood pressure. CONTRAST:  60 cc Omnipaque 300 ANESTHESIA/SEDATION: Moderate (conscious) sedation was employed during this procedure. Najiyah Paris total of Versed 3 mg and Fentanyl 125 mcg was administered intravenously. Moderate Sedation Time: 67 minutes. The patient's level of consciousness and vital signs were monitored continuously by radiology nursing throughout the procedure under my direct supervision. FLUOROSCOPY TIME:  18 minutes, 18 seconds (702 mGy) COMPLICATIONS: None immediate. TECHNIQUE: Informed written consent was obtained from the patient after Kyleigha Markert discussion of the risks (including but not limited to bleeding, including nontarget bleeding given history of malignancy, infection, pulmonary embolism and necessity for venous angioplasty and/or stent placement), benefits and alternatives to treatment. Questions regarding the procedure were encouraged and answered. Westen Dinino timeout was performed prior to the initiation of the procedure. The patient was placed prone on the fluoroscopy table and the skin posterior to the  bilateral knees was prepped and draped in the usual sterile fashion, and Amey Hossain sterile drape was applied covering the operative field. Maximum barrier sterile technique with sterile gowns and gloves were used for the procedure. Under direct ultrasound guidance, the left popliteal vein was access with Shimshon Narula micropuncture kit after the overlying soft tissues were anesthetized with 1% lidocaine. An ultrasound image was saved for documentation purposed. This allowed for placement of Marnisha Stampley 8-French vascular sheath. Jamas Jaquay limited venogram was  performed through the side arm of the vascular sheath. With the use of Stana Bayon regular glidewire, Yaire Kreher Kumpe catheter was advanced through the left femoral, external iliac vein and through the left common iliac vein to the level of the IVC. Limited vena cavogram demonstrates complete occlusion of the distal aspect of the IVC extending to involve both left common iliac veins. Ultimately, Layce Sprung moderate to long segment apparently extrinsic malignant narrowing involving the mid/distal aspect of the IVC was successfully traversed with inferior venogram demonstrating patency of the more cranial infrarenal IVC. Next, with the use of Gerilyn Stargell Angiojet device, approximately 250 cc of Mairim Bade solution of 10 mg of tPA mixed in 500 mg of saline was infused throughout the occluded segment of the IVC, left common and external iliac veins to the level of the left common femoral vein. Attention was now paid towards access of the right lower extremity popliteal vein. After the overlying soft tissues were anesthetized with 1% lidocaine, the right popliteal vein was accessed micropuncture kit. Limited venogram was performed through the outer micro puncture sheath. This allowed for placement of Drucilla Cumber 6 French, 35 cm vascular sheath. Again, with the use of Alisea Matte regular glidewire, Aybree Lanyon Kumpe catheter was advanced beyond the obstructing caval lesion. Contrast injection confirmed appropriate positioning. Next, after approximately 20 minute tPA dwell, mechanical thrombectomy was performed with the Angiojet device both from the left lower extremity access as well as the right lower extremity access. Limited vena cavagram was performed via the right lower extremity access. Kumpe catheter was utilized for measurement purposes and ultimately Mykela Mewborn 90 cm/20-cm infusion catheter was placed from the right lower extremity access and Kamarion Zagami 90 cm/30 cm infusion catheter was placed from the left lower extremity access adequately covering the occluded segment of the bilateral venous systems as  well as the caudal aspect of the IVC. Postprocedural spot fluoroscopic images were obtained. Both popliteal vascular sheath were secured in place. Dressings were applied. Patient's was subjectively cold by the end of the procedure with associated elevation in his blood pressure and as such he was administered 10 mg of hydralazine and bear hugger warming device was applied. The patient otherwise tolerated the procedure well without immediate postprocedural complication. FINDINGS: Sonographic evaluation demonstrates patency of the bilateral popliteal veins. Contrast injection demonstrates patency of the bilateral popliteal and superficial femoral veins. There is occlusive thrombus extending from the left common femoral vein and the right common iliac vein through the mid/caudal aspect of the IVC secondary to Emmalyn Hinson moderate to long segment length presumably extrinsic malignant occlusion secondary to known adjacent bulky retroperitoneal lymphadenopathy. Following pharmacological and mechanical thrombectomy as detailed above, there is restored flow as demonstrated on limited right central pelvic venogram with large amount of residual thrombus. Following infusion catheter placement, both side ports traverse both occluded segments of the IVC and pelvic venous systems. IMPRESSION: 1. 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 2. Successful initiation of bilateral  mechanical and pharmacologic thrombectomy with bilateral infusion catheters appropriately positioned. PLAN: The patient will be transferred to ICU for overnight lytic infusion and will return tomorrow for repeat venogram and potential intervention. Electronically Signed   By: Sandi Mariscal M.D.   On: 04/15/2019 17:04   IR US Guide Vasc Access Left  Result Date: 04/15/2019 INDICATION: History of newly diagnosed recurrent testicular cancer, now with bulky  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 Patient has experienced minimal improvement since initiation of IV heparin, and as such request made for initiation of catheter directed thrombolysis for symptomatic purposes. EXAM: 1. ULTRASOUND GUIDANCE FOR VASCULAR ACCESS X2 2. FLUOROSCOPIC GUIDED PHARMACOLOGICAL AND MECHANICAL THROMBECTOMY WITH ANGIOJET DEVICE 3. BILATERAL LOWER EXTREMITY VENOGRAM AND PLACEMENT OF INFUSION CATHETERS COMPARISON:  CT chest, abdomen pelvis-04/11/2019; abdominal MRV - 04/13/2019 MEDICATIONS: Hydralazine 10 mg IV, administered near the completion of the procedure secondary to persistently elevated blood pressure. CONTRAST:  60 cc Omnipaque 300 ANESTHESIA/SEDATION: Moderate (conscious) sedation was employed during this procedure. Lya Holben total of Versed 3 mg and Fentanyl 125 mcg was administered intravenously. Moderate Sedation Time: 67 minutes. The patient's level of consciousness and vital signs were monitored continuously by radiology nursing throughout the procedure under my direct supervision. FLUOROSCOPY TIME:  18 minutes, 18 seconds (169 mGy) COMPLICATIONS: None immediate. TECHNIQUE: Informed written consent was obtained from the patient after Milton Streicher discussion of the risks (including but not limited to bleeding, including nontarget bleeding given history of malignancy, infection, pulmonary embolism and necessity for venous angioplasty and/or stent placement), benefits and alternatives to treatment. Questions regarding the procedure were encouraged and answered. Joleigh Mineau timeout was performed prior to the initiation of the procedure. The patient was placed prone on the fluoroscopy table and the skin posterior to the bilateral knees was prepped and draped in the usual sterile fashion, and Lyniah Fujita sterile drape was applied covering the operative field. Maximum barrier sterile technique with sterile gowns and gloves were used for  the procedure. Under direct ultrasound guidance, the left popliteal vein was access with Salman Wellen micropuncture kit after the overlying soft tissues were anesthetized with 1% lidocaine. An ultrasound image was saved for documentation purposed. This allowed for placement of Amarisa Wilinski 8-French vascular sheath. Keenan Dimitrov limited venogram was performed through the side arm of the vascular sheath. With the use of Cloria Ciresi regular glidewire, Keyion Knack Kumpe catheter was advanced through the left femoral, external iliac vein and through the left common iliac vein to the level of the IVC. Limited vena cavogram demonstrates complete occlusion of the distal aspect of the IVC extending to involve both left common iliac veins. Ultimately, Malva Diesing moderate to long segment apparently extrinsic malignant narrowing involving the mid/distal aspect of the IVC was successfully traversed with inferior venogram demonstrating patency of the more cranial infrarenal IVC. Next, with the use of Chandra Feger Angiojet device, approximately 250 cc of Corrin Hingle solution of 10 mg of tPA mixed in 500 mg of saline was infused throughout the occluded segment of the IVC, left common and external iliac veins to the level of the left common femoral vein. Attention was now paid towards access of the right lower extremity popliteal vein. After the overlying soft tissues were anesthetized with 1% lidocaine, the right popliteal vein was accessed micropuncture kit. Limited venogram was performed through the outer micro puncture sheath. This allowed for placement of Ginette Bradway 6 French, 35 cm vascular sheath. Again, with the use of Urho Rio regular glidewire, Santez Woodcox Kumpe catheter was advanced beyond  the obstructing caval lesion. Contrast injection confirmed appropriate positioning. Next, after approximately 20 minute tPA dwell, mechanical thrombectomy was performed with the Angiojet device both from the left lower extremity access as well as the right lower extremity access. Limited vena cavagram was performed via the right lower extremity  access. Kumpe catheter was utilized for measurement purposes and ultimately Herrick Hartog 90 cm/20-cm infusion catheter was placed from the right lower extremity access and Anwar Crill 90 cm/30 cm infusion catheter was placed from the left lower extremity access adequately covering the occluded segment of the bilateral venous systems as well as the caudal aspect of the IVC. Postprocedural spot fluoroscopic images were obtained. Both popliteal vascular sheath were secured in place. Dressings were applied. Patient's was subjectively cold by the end of the procedure with associated elevation in his blood pressure and as such he was administered 10 mg of hydralazine and bear hugger warming device was applied. The patient otherwise tolerated the procedure well without immediate postprocedural complication. FINDINGS: Sonographic evaluation demonstrates patency of the bilateral popliteal veins. Contrast injection demonstrates patency of the bilateral popliteal and superficial femoral veins. There is occlusive thrombus extending from the left common femoral vein and the right common iliac vein through the mid/caudal aspect of the IVC secondary to Jaimee Corum moderate to long segment length presumably extrinsic malignant occlusion secondary to known adjacent bulky retroperitoneal lymphadenopathy. Following pharmacological and mechanical thrombectomy as detailed above, there is restored flow as demonstrated on limited right central pelvic venogram with large amount of residual thrombus. Following infusion catheter placement, both side ports traverse both occluded segments of the IVC and pelvic venous systems. IMPRESSION: 1. 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 2. Successful initiation of bilateral mechanical and pharmacologic thrombectomy with bilateral infusion catheters appropriately positioned. PLAN: The patient will be transferred to  ICU for overnight lytic infusion and will return tomorrow for repeat venogram and potential intervention. Electronically Signed   By: Sandi Mariscal M.D.   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 Patient has experienced minimal improvement since initiation of IV heparin, and as such request made for initiation of catheter directed thrombolysis for symptomatic purposes. EXAM: 1. ULTRASOUND GUIDANCE FOR VASCULAR ACCESS X2 2. FLUOROSCOPIC GUIDED PHARMACOLOGICAL AND MECHANICAL THROMBECTOMY WITH ANGIOJET DEVICE 3. BILATERAL LOWER EXTREMITY VENOGRAM AND PLACEMENT OF INFUSION CATHETERS COMPARISON:  CT chest, abdomen pelvis-04/11/2019; abdominal MRV - 04/13/2019 MEDICATIONS: Hydralazine 10 mg IV, administered near the completion of the procedure secondary to persistently elevated blood pressure. CONTRAST:  60 cc Omnipaque 300 ANESTHESIA/SEDATION: Moderate (conscious) sedation was employed during this procedure. Jamarius Saha total of Versed 3 mg and Fentanyl 125 mcg was administered intravenously. Moderate Sedation Time: 67 minutes. The patient's level of consciousness and vital signs were monitored continuously by radiology nursing throughout the procedure under my direct supervision. FLUOROSCOPY TIME:  18 minutes, 18 seconds (767 mGy) COMPLICATIONS: None immediate. TECHNIQUE: Informed written consent was obtained from the patient after Anmol Fleck discussion of the risks (including but not limited to bleeding, including nontarget bleeding given history of malignancy, infection, pulmonary embolism and necessity for venous angioplasty and/or stent placement), benefits and alternatives to treatment. Questions regarding the procedure were encouraged and answered. Dreyden Rohrman timeout was performed prior to  the  initiation of the procedure. The patient was placed prone on the fluoroscopy table and the skin posterior to the bilateral knees was prepped and draped in the usual sterile fashion, and Madeeha Costantino sterile drape was applied covering the operative field. Maximum barrier sterile technique with sterile gowns and gloves were used for the procedure. Under direct ultrasound guidance, the left popliteal vein was access with Dalila Arca micropuncture kit after the overlying soft tissues were anesthetized with 1% lidocaine. An ultrasound image was saved for documentation purposed. This allowed for placement of Krystiana Fornes 8-French vascular sheath. Shatima Zalar limited venogram was performed through the side arm of the vascular sheath. With the use of Janayah Zavada regular glidewire, Rocsi Hazelbaker Kumpe catheter was advanced through the left femoral, external iliac vein and through the left common iliac vein to the level of the IVC. Limited vena cavogram demonstrates complete occlusion of the distal aspect of the IVC extending to involve both left common iliac veins. Ultimately, Rino Hosea moderate to long segment apparently extrinsic malignant narrowing involving the mid/distal aspect of the IVC was successfully traversed with inferior venogram demonstrating patency of the more cranial infrarenal IVC. Next, with the use of Makalynn Berwanger Angiojet device, approximately 250 cc of Nolyn Eilert solution of 10 mg of tPA mixed in 500 mg of saline was infused throughout the occluded segment of the IVC, left common and external iliac veins to the level of the left common femoral vein. Attention was now paid towards access of the right lower extremity popliteal vein. After the overlying soft tissues were anesthetized with 1% lidocaine, the right popliteal vein was accessed micropuncture kit. Limited venogram was performed through the outer micro puncture sheath. This allowed for placement of Zella Dewan 6 French, 35 cm vascular sheath. Again, with the use of Dareion Kneece regular glidewire, Rhett Najera Kumpe catheter was advanced beyond the obstructing caval  lesion. Contrast injection confirmed appropriate positioning. Next, after approximately 20 minute tPA dwell, mechanical thrombectomy was performed with the Angiojet device both from the left lower extremity access as well as the right lower extremity access. Limited vena cavagram was performed via the right lower extremity access. Kumpe catheter was utilized for measurement purposes and ultimately Linsey Arteaga 90 cm/20-cm infusion catheter was placed from the right lower extremity access and Javone Ybanez 90 cm/30 cm infusion catheter was placed from the left lower extremity access adequately covering the occluded segment of the bilateral venous systems as well as the caudal aspect of the IVC. Postprocedural spot fluoroscopic images were obtained. Both popliteal vascular sheath were secured in place. Dressings were applied. Patient's was subjectively cold by the end of the procedure with associated elevation in his blood pressure and as such he was administered 10 mg of hydralazine and bear hugger warming device was applied. The patient otherwise tolerated the procedure well without immediate postprocedural complication. FINDINGS: Sonographic evaluation demonstrates patency of the bilateral popliteal veins. Contrast injection demonstrates patency of the bilateral popliteal and superficial femoral veins. There is occlusive thrombus extending from the left common femoral vein and the right common iliac vein through the mid/caudal aspect of the IVC secondary to Quinnetta Roepke moderate to long segment length presumably extrinsic malignant occlusion secondary to known adjacent bulky retroperitoneal lymphadenopathy. Following pharmacological and mechanical thrombectomy as detailed above, there is restored flow as demonstrated on limited right central pelvic venogram with large amount of residual thrombus. Following infusion catheter placement, both side ports traverse both occluded segments of the IVC and pelvic venous systems. IMPRESSION: 1. 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 2. Successful initiation of bilateral mechanical and pharmacologic thrombectomy with bilateral infusion catheters appropriately positioned. PLAN: The patient will be transferred to ICU for overnight lytic infusion and will return tomorrow for repeat venogram and potential intervention. Electronically Signed   By: Sandi Mariscal M.D.   On: 04/15/2019 17:04   CT BIOPSY  Result Date: 04/12/2019 INDICATION: 39 year old male with Samaira Holzworth history of testicular cancer previously treated. He presents with new retroperitoneal adenopathy EXAM: CT BIOPSY MEDICATIONS: None. ANESTHESIA/SEDATION: Moderate (conscious) sedation was employed during this procedure. Savannha Welle total of Versed 2.0 mg and Fentanyl 100 mcg was administered intravenously. Moderate Sedation Time: 14 minutes. The patient's level of consciousness and vital signs were monitored continuously by radiology nursing throughout the procedure under my direct supervision. FLUOROSCOPY TIME:  CT COMPLICATIONS: None PROCEDURE: Informed written consent was obtained from the patient after Enrico Eaddy thorough discussion of the procedural risks, benefits and alternatives. All questions were addressed. Maximal Sterile Barrier Technique was utilized including caps, mask, sterile gowns, sterile gloves, sterile drape, hand hygiene and skin antiseptic. Zinedine Ellner timeout was performed prior to the initiation of the procedure. Patient positioned prone position on CT gantry table. Scout CT acquired for planning purposes. The patient was then prepped and draped in the usual sterile fashion. 1% lidocaine was used for local anesthesia. Introducer needle was then placed targeting the left-sided retroperitoneal lymphadenopathy. Once we confirmed needle tip position, multiple 18 gauge core biopsy were acquired. Specimen placed in the saline as S fresh specimen. Needle was  removed and Ciarrah Rae final CT was acquired. Patient tolerated the procedure well and remained hemodynamically stable throughout. No complications were encountered and no significant blood loss. IMPRESSION: Status post CT-guided biopsy of left retroperitoneal lymphadenopathy. Signed, Dulcy Fanny. Dellia Nims, RPVI [facility] Electronically Signed   By: [name] D.O.   On: 04/12/2019 12:56   DG Chest Port 1 View  Result Date: 04/24/2019 CLINICAL DATA:  39 year old male status post PICC placement. EXAM: PORTABLE CHEST 1 VIEW COMPARISON:  Chest radiograph dated 04/18/2019. FINDINGS: Left-sided PICC with tip over the right atrium close to the cavoatrial junction. Faint bilateral lower lung field densities may represent atelectasis or atypical infection. Clinical correlation recommended. There is no pleural effusion or pneumothorax. The cardiac silhouette is within normal limits. No acute osseous pathology. IMPRESSION: 1. Left-sided PICC with tip over the right atrium. 2. Faint bilateral lower lung field atelectasis or developing infiltrate. Electronically Signed   By: Anner Crete M.D.   On: 04/24/2019 18:07   DG Chest Port 1 View  Result Date: 04/18/2019 CLINICAL DATA:  Fever, testicular cancer EXAM: PORTABLE CHEST 1 VIEW COMPARISON:  None. FINDINGS: Normal heart size. Normal mediastinal contour. No pneumothorax. No pleural effusion. Slightly low lung volumes. Minimal streaky opacities at both lung bases. No pulmonary edema. IMPRESSION: Slightly low lung volumes. Minimal streaky opacities at the lung bases, favoring minimal atelectasis. No consolidative airspace disease to suggest Brent Taillon pneumonia. Electronically Signed   By: Ilona Sorrel M.D.   On: 04/18/2019 15:41   DG Abd Portable 1V  Result Date: 04/25/2019 CLINICAL DATA:  Abdominal distension, history of testicular cancer, history of DVT EXAM: PORTABLE ABDOMEN - 1 VIEW COMPARISON:  04/21/2019, 04/22/2019  FINDINGS: 2 supine frontal views of the abdomen and pelvis demonstrate IVC stent unchanged. Diffuse gaseous distension of the large and small bowel is stable consistent with ileus. No masses or  abnormal calcifications. IMPRESSION: 1. Stable gaseous distension of the bowel compatible with ileus. Electronically Signed   By: Randa Ngo M.D.   On: 04/25/2019 16:22   DG Abd Portable 1V  Result Date: 04/18/2019 CLINICAL DATA:  Abdominal pain and distension EXAM: PORTABLE ABDOMEN - 1 VIEW COMPARISON:  04/11/2019 FINDINGS: Supine frontal view of the abdomen and pelvis excludes the hemidiaphragms, right flank, and lower pelvis by collimation. There is diffuse gaseous distention of the colon, likely reflecting postoperative ileus. Stable position of the IVC stent. No masses or abnormal calcifications. IMPRESSION: 1. Distended gas-filled colon consistent with postprocedural ileus. 2. IVC stent as above Electronically Signed   By: Randa Ngo M.D.   On: 04/18/2019 08:41   ECHOCARDIOGRAM COMPLETE  Result Date: 04/19/2019   ECHOCARDIOGRAM REPORT   Patient Name:   DANIAL HLAVAC Date of Exam: 04/19/2019 Medical Rec #:  [id number]      Height:       75.0 in Accession #:    [id number]     Weight:       216.9 lb Date of Birth:  06/04/1980       BSA:          2.27 m Patient Age:    38 years       BP:           132/83 mmHg Patient Gender: M              HR:           125 bpm. Exam Location:  Inpatient Procedure: 2D Echo, Color Doppler and Cardiac Doppler                          MODIFIED REPORT: This report was modified by Cherlynn Kaiser MD on 04/19/2019 due to.  Indications:     Fever  History:         Patient has no prior history of Echocardiogram examinations.                  Testicular cancer; Signs/Symptoms:Fever.  Sonographer:     Johny Chess Referring Phys:  [id number] Noland Fordyce MATHEWS Diagnosing Phys: Cherlynn Kaiser MD  Sonographer Comments: Suboptimal apical window. IMPRESSIONS  1. Left ventricular ejection  fraction, by visual estimation, is 60 to 65%. The left ventricle has normal function. Mildly increased left ventricular posterior wall thickness. There is mildly increased left ventricular hypertrophy.  2. Global right ventricle has normal systolic function.The right ventricular size is normal. Right vetricular wall thickness was not assessed.  3. The mitral valve is normal in structure. No evidence of mitral valve regurgitation. No evidence of mitral stenosis.  4. The tricuspid valve is normal in structure. Tricuspid valve regurgitation is not demonstrated.  5. The aortic valve is normal in structure. Aortic valve regurgitation is not visualized. No evidence of aortic valve sclerosis or stenosis.  6. The inferior vena cava is normal in size with greater than 50% respiratory variability, suggesting right atrial pressure of 3 mmHg.  7. No evidence of valvular vegetations on this transthoracic echocardiogram, however acoustic windows are suboptimal. FINDINGS  Left Ventricle: Left ventricular ejection fraction, by visual estimation, is 60 to 65%. The left ventricle has normal function. The left ventricle is not well visualized. Mildly increased left ventricular posterior wall thickness. There is mildly increased left ventricular hypertrophy. Left ventricular diastolic parameters were normal. Normal left atrial pressure. Right Ventricle: The right ventricular size is normal. Right vetricular  wall thickness was not assessed. Global RV systolic function is has normal systolic function. Left Atrium: Left atrial size was normal in size. Right Atrium: Right atrial size was normal in size Pericardium: There is no evidence of pericardial effusion. Mitral Valve: The mitral valve is normal in structure. No evidence of mitral valve regurgitation. No evidence of mitral valve stenosis by observation. Tricuspid Valve: The tricuspid valve is normal in structure. Tricuspid valve regurgitation is not demonstrated. Aortic Valve: The  aortic valve is normal in structure. Aortic valve regurgitation is not visualized. The aortic valve is structurally normal, with no evidence of sclerosis or stenosis. Pulmonic Valve: The pulmonic valve was normal in structure. Pulmonic valve regurgitation is not visualized. Pulmonic regurgitation is not visualized. Aorta: The aortic root is normal in size and structure. Venous: The inferior vena cava is normal in size with greater than 50% respiratory variability, suggesting right atrial pressure of 3 mmHg. IAS/Shunts: The interatrial septum was not well visualized. Additional Comments: No evidence of valvular vegetations on this transthoracic echocardiogram. Would recommend Catalaya Garr transesophageal echocardiogram to exclude infective endocarditis if clinically indicated.  LEFT VENTRICLE PLAX 2D LVIDd:         5.00 cm  Diastology LVIDs:         3.10 cm  LV e' lateral: 14.10 cm/s LV PW:         1.10 cm  LV e' medial:  12.30 cm/s LV IVS:        0.80 cm LVOT diam:     2.40 cm LV SV:         80 ml LV SV Index:   35.02 LVOT Area:     4.52 cm  RIGHT VENTRICLE RV S prime:     27.10 cm/s TAPSE (M-mode): 2.8 cm LEFT ATRIUM           Index LA diam:      3.40 cm 1.50 cm/m LA Vol (A4C): 38.8 ml 17.08 ml/m  AORTIC VALVE LVOT Vmax:   105.00 cm/s LVOT Vmean:  74.200 cm/s LVOT VTI:    0.167 m  AORTA Ao Root diam: 3.60 cm  SHUNTS Systemic VTI:  0.17 m Systemic Diam: 2.40 cm  Cherlynn Kaiser MD Electronically signed by Cherlynn Kaiser MD Signature Date/Time: 04/19/2019/7:59:29 PM    Final (Updated)    IR TRANSCATH PLC STENT 1ST ART NOT LE CV CAR VERT CAR  Result Date: 04/16/2019 INDICATION: History of newly diagnosed recurrent testicular cancer, now with bulky 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 Patient was initiated bilateral pelvic/IVC catheter directed thrombolysis day prior (04/15/2019), and returns today following 24 hours of tPA infusion.  EXAM: 1. IR THROMB F/U EVAL ART/VEN FINAL DAY 2. FLUOROSCOPIC GUIDED ANGIOPLASTY AND STENT PLACEMENT OF THE IVC COMPARISON:  Initiation of bilateral catheter directed pelvic venous and IVC thrombolysis - 04/15/2019; CT abdomen pelvis - 04/11/2019 MEDICATIONS: Heparin 7000 units CONTRAST:  85 cc Omnipaque 300 ANESTHESIA/SEDATION: Moderate (conscious) sedation was employed during this procedure. Takao Lizer total of Versed 2 mg and Dilaudid 3 mg was administered intravenously. Moderate Sedation Time: 128 minutes. The patient's level of consciousness and vital signs were monitored continuously by radiology nursing throughout the procedure under my direct supervision. FLUOROSCOPY TIME:  14 minutes, 12 seconds (791.5 mGy) COMPLICATIONS: None immediate. TECHNIQUE: Informed written consent was obtained from the patient after Jaecion Dempster discussion of the risks, benefits and alternatives to treatment. Questions regarding the procedure were encouraged and answered. Tahesha Skeet timeout was  performed prior to the initiation of the procedure. The patient was placed prone on the fluoroscopy table and the skin posterior to the bilateral knees as well as the external portion the existing bilateral vascular sheaths and infusion catheters was prepped and draped in usual sterile fashion. The surrounding subcutaneous tissues about both popliteal approach vascular sheath was anesthetized with 1% lidocaine with epinephrine. Next, post lysis venograms were performed from the bilateral infusion catheters following removal of the bilateral infusion wires. Images were reviewed and the decision was made to proceed with additional mechanical thrombectomy. With the use of Fletcher Rathbun vertebral catheter exchange length stiff Glidewires were advanced from both existing popliteal vascular sheaths superior to the nearly obstructing caval lesion. Contrast injection confirmed appropriate positioning. Stiff glide wires were advanced into the left subclavian vein. 4000 units of heparin was  administered intravenously. Next, beginning with the left lower extremity, the existing 8 French vascular sheath was exchanged over the glidewire for Chandelle Harkey 13 [place] vascular sheath. The 16 mm diameter Clottreiever Inari mechanical thrombectomy device was then utilized to perform 3 passes from the left popliteal approach from at and just below level of the obstructing caval lesion. Post thrombectomy images were obtained from the left lower extremity. Next, the existing right lower extremity 6 French popliteal vein vascular sheath was exchanged over Nance Mccombs guidewire for Drayton Tieu 13 French vascular sheath. Again, from the right lower extremity approach, the 16 mm diameter Clottreiever Inari mechanical thrombectomy device was then utilized to perform 3 passes from the level at and just below the obstructing caval lesion. Post thrombectomy images were obtained from the right lower extremity. An ACT level was obtained and an additional 3000 units of heparin was administered intravenously. The long segment eccentric irregular narrowing/subtotal occlusion of the IVC was subsequently balloon angioplastied at multiple stations with Joevon Holliman 14 mm x 4 cm Atlas balloon. Despite adequate balloon apposition, post angioplasty venogram images demonstrate Angellee Cohill recurrent hemodynamically significant narrowing. As such, Maddux Vanscyoc 20 mm x 60 mm Venovo venous stent was deployed across the superior and mid aspect of the caval lesion and was subsequently balloon angioplastied at multiple stations initially with Murel Shenberger 16 mm x 4 cm atlas balloon and ultimately with an 18 mm x 4 cm balloon. Post stent deployment venogram images were obtained however there is felt to be incomplete coverage of the distal aspect of the malignant narrowing. As such, an additional 20 mm x 40 mm Venovo venous stent was deployed overlapping the mid/caudal aspect of venous stent and adequately covering the caudal aspect caval lesion. Both overlapping venous stents were subsequently balloon angioplastied  at multiple stations with the 18 mm atlas balloon and completion venogram images were obtained from both lower extremities. Images were reviewed and the procedure was terminated. All wires, catheters and sheaths were removed from the bilateral popliteal vein accesses and hemostasis was achieved with manual compression. Dressings applied. The patient tolerated the procedure well without immediate postprocedural complication. FINDINGS: Initial post lysis venogram performed from the left lower extremity infusion catheter demonstrates restoration of flow through the left pelvis with moderate amount residual thrombus primarily within the peripheral aspect of the left external iliac vein as well as the left common and central aspect of the left superficial femoral veins. There is passage of contrast beyond the nearly obstructing lesion involving mid/caudal aspect of the IVC. Initial post lysis venogram performed from the right lower extremity demonstrates persistent occlusion at the level of the right common iliac vein. Following 3 passes from each lower  extremity with the 16 mm diameter Clottreiever Inari mechanical thrombectomy device, nearly all residual clot burden within the pelvis was removed. Despite balloon angioplasty at multiple stations to 14 mm diameter with excellent balloon apposition, post angioplasty inferior vena cavogram demonstrates Izreal Kock recurrent hemodynamically significant malignant narrowing/subtotal occlusion involving the mid/distal aspect of IVC (series 22). This lesion was again noted to be significantly caudal to the confluence of the bilateral renal veins. As such, nearly obstructing caval lesion was treated with overlapping stent deployment of 20 mm diameter Venovo venous stents which were subsequently balloon angioplastied to 18 mm diameter. Completion images demonstrate Tymeka Privette technically excellent result with restored brisk flow through the bilateral pelvic systems and the IVC. There is Ohn Bostic mild  residual narrowing at the level of the mid/caudal aspect of the IVC, though not felt to result in Jerami Tammen residual hemodynamically significant stenosis. No evidence of complication. Specifically, no evidence of contrast extravasation or vessel dissection. 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 PLAN: - Maintain IV heparin drip until patient is successfully converted to long-term anticoagulant. - Recommend maintaining SCD devices while in bed. Electronically Signed   By: Sandi Mariscal M.D.   On: 04/16/2019 16:26   Timothy MRV ABDOMEN W WO CONTRAST  Result Date: 04/13/2019 CLINICAL DATA:  39 year old male with retroperitoneal lymphadenopathy, history of testicular cancer previously treated, now with ilio caval DVT on prior CT, and left lower extremity DVT on duplex. EXAM: MRI ABDOMEN WITHOUT AND WITH CONTRAST TECHNIQUE: Multiplanar multisequence Timothy imaging of the abdomen was performed both before and after the administration of intravenous contrast. CONTRAST:  44m GADAVIST GADOBUTROL 1 MMOL/ML IV SOLN COMPARISON:  CT 04/11/2019 FINDINGS: Vascular: Arterial: Unremarkable course caliber and contour of the visualized abdominal aorta. Unremarkable course caliber and contour of the bilateral iliac arteries and proximal femoral arteries. Bilateral hypogastric arteries are patent. Venous: Right iliac/femoral system: Flow signal is maintained within the visualized proximal femoral veins. Flow signal maintained into the external iliac vein. Absent flow signal with evidence of thrombus involving the proximal right common iliac vein to the confluence and into the IVC. Expansion of the right common iliac vein. Multiple pelvic collateral vessels. Left iliac/femoral system: Absent flow signal through the length of the left iliac venous system extending through the femoral  vein. Expansion of the left iliac and femoral venous system. Multiple pelvic collateral veins. IVC: The IVC is not well evaluated at the level of the adenopathy. Absent flow signal above the confluence of the iliac veins compatible with ilio caval thrombus. The juxtarenal IVC is patent with maintained flow signal and maintain flow signal of the bilateral renal veins. Nonvascular: Unremarkable musculoskeletal structures. Partially distended urinary bladder. Unremarkable appearance of the visualized kidneys and GI system. Edema of the lower abdominal wall/pelvic body wall, as well as edema of the left proximal lower extremity. IMPRESSION: The Timothy confirms 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 Pathologic lymphadenopathy of the retroperitoneum, similar to comparison CT, contributing to external compression of the infrarenal IVC. The current Timothy is nondiagnostic to confirm or rule out tumor thrombus. Lower abdominal/pelvic wall edema including edema of the proximal left lower extremity. Signed, JDulcy Fanny WDellia Nims RPVI [facility] Electronically Signed   By: JCorrie MckusickD.O.   On: 04/13/2019 14:14   VAS [place]OWER  EXTREMITY VENOUS (DVT)  Result Date: 04/11/2019  Lower Venous Study Indications: Swelling.  Risk Factors: None identified. Anticoagulation: Heparin. Limitations: Poor ultrasound/tissue interface and bowel gas. Comparison Study: No prior studies. Performing Technologist: Oliver Hum RVT  Examination Guidelines: Marsean Elkhatib complete evaluation includes B-mode imaging, spectral Doppler, color Doppler, and power Doppler as needed of all accessible portions of each vessel. Bilateral testing is considered an integral part of Smantha Boakye complete examination. Limited examinations for reoccurring indications may be performed as noted.   +---------+---------------+---------+-----------+----------+--------------+ RIGHT    CompressibilityPhasicitySpontaneityPropertiesThrombus Aging +---------+---------------+---------+-----------+----------+--------------+ CFV      Full           Yes      Yes                                 +---------+---------------+---------+-----------+----------+--------------+ SFJ      Full                                                        +---------+---------------+---------+-----------+----------+--------------+ FV Prox  Full                                                        +---------+---------------+---------+-----------+----------+--------------+ FV Mid   Full                                                        +---------+---------------+---------+-----------+----------+--------------+ FV DistalFull                                                        +---------+---------------+---------+-----------+----------+--------------+ PFV      Full                                                        +---------+---------------+---------+-----------+----------+--------------+ POP      Full           Yes      Yes                                 +---------+---------------+---------+-----------+----------+--------------+ PTV      Full                                                        +---------+---------------+---------+-----------+----------+--------------+ PERO     Full                                                        +---------+---------------+---------+-----------+----------+--------------+   +---------+---------------+---------+-----------+----------+--------------+  LEFT     CompressibilityPhasicitySpontaneityPropertiesThrombus Aging +---------+---------------+---------+-----------+----------+--------------+ CFV      None           No       No                   Acute           +---------+---------------+---------+-----------+----------+--------------+ SFJ      None                                         Acute          +---------+---------------+---------+-----------+----------+--------------+ FV Prox  None           No       No                   Acute          +---------+---------------+---------+-----------+----------+--------------+ FV Mid   None           No       No                   Acute          +---------+---------------+---------+-----------+----------+--------------+ FV DistalNone           No       No                   Acute          +---------+---------------+---------+-----------+----------+--------------+ POP      Partial        No       No                   Acute          +---------+---------------+---------+-----------+----------+--------------+ PTV      Full                                                        +---------+---------------+---------+-----------+----------+--------------+ PERO     None                                         Acute          +---------+---------------+---------+-----------+----------+--------------+ EIV      None           No       No                   Acute          +---------+---------------+---------+-----------+----------+--------------+ Unable to visualize CIV, or IVC due to bowel gas.    Summary: Right: There is no evidence of deep vein thrombosis in the lower extremity. No cystic structure found in the popliteal fossa. Left: Findings consistent with acute deep vein thrombosis involving the left external iliac vein, common femoral vein, left femoral vein, left popliteal vein, and left peroneal veins. No cystic structure found in the popliteal fossa. Unable to visualize CIV, or IVC due to bowel gas.  *See table(s) above for measurements and observations. Electronically signed by Deitra Mayo MD on 04/11/2019 at 4:09:39 PM.  Final    [place] EKG SITE RITE  Result Date:  04/24/2019 If Site Rite image not attached, placement could not be confirmed due to current cardiac rhythm.  [place] EKG SITE RITE  Result Date: 04/21/2019 If Site Rite image not attached, placement could not be confirmed due to current cardiac rhythm.  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 Patient has experienced minimal improvement since initiation of IV heparin, and as such request made for initiation of catheter directed thrombolysis for symptomatic purposes. EXAM: 1. ULTRASOUND GUIDANCE FOR VASCULAR ACCESS X2 2. FLUOROSCOPIC GUIDED PHARMACOLOGICAL AND MECHANICAL THROMBECTOMY WITH ANGIOJET DEVICE 3. BILATERAL LOWER EXTREMITY VENOGRAM AND PLACEMENT OF INFUSION CATHETERS COMPARISON:  CT chest, abdomen pelvis-04/11/2019; abdominal MRV - 04/13/2019 MEDICATIONS: Hydralazine 10 mg IV, administered near the completion of the procedure secondary to persistently elevated blood pressure. CONTRAST:  60 cc Omnipaque 300 ANESTHESIA/SEDATION: Moderate (conscious) sedation was employed during this procedure. Darrie Macmillan total of Versed 3 mg and Fentanyl 125 mcg was administered intravenously. Moderate Sedation Time: 67 minutes. The patient's level of consciousness and vital signs were monitored continuously by radiology nursing throughout the procedure under my direct supervision. FLUOROSCOPY TIME:  18 minutes, 18 seconds (010 mGy) COMPLICATIONS: None immediate. TECHNIQUE: Informed written consent was obtained from the patient after Tsutomu Barfoot discussion of the risks (including but not limited to bleeding, including nontarget bleeding given history of malignancy, infection, pulmonary embolism and necessity for venous angioplasty and/or stent placement), benefits and alternatives to treatment. Questions regarding  the procedure were encouraged and answered. Kelina Beauchamp timeout was performed prior to the initiation of the procedure. The patient was placed prone on the fluoroscopy table and the skin posterior to the bilateral knees was prepped and draped in the usual sterile fashion, and Neda Willenbring sterile drape was applied covering the operative field. Maximum barrier sterile technique with sterile gowns and gloves were used for the procedure. Under direct ultrasound guidance, the left popliteal vein was access with Mattelyn Imhoff micropuncture kit after the overlying soft tissues were anesthetized with 1% lidocaine. An ultrasound image was saved for documentation purposed. This allowed for placement of Nil Xiong 8-French vascular sheath. Clay Solum limited venogram was performed through the side arm of the vascular sheath. With the use of Alayha Babineaux regular glidewire, Vilma Will Kumpe catheter was advanced through the left femoral, external iliac vein and through the left common iliac vein to the level of the IVC. Limited vena cavogram demonstrates complete occlusion of the distal aspect of the IVC extending to involve both left common iliac veins. Ultimately, Jeanenne Licea moderate to long segment apparently extrinsic malignant narrowing involving the mid/distal aspect of the IVC was successfully traversed with inferior venogram demonstrating patency of the more cranial infrarenal IVC. Next, with the use of Sharin Altidor Angiojet device, approximately 250 cc of Pebbles Zeiders solution of 10 mg of tPA mixed in 500 mg of saline was infused throughout the occluded segment of the IVC, left common and external iliac veins to the level of the left common femoral vein. Attention was now paid towards access of the right lower extremity popliteal vein. After the overlying soft tissues were anesthetized with 1% lidocaine, the right popliteal vein was accessed micropuncture kit. Limited venogram was performed through the outer micro puncture sheath. This allowed for placement of Marwin Primmer 6 French, 35 cm vascular sheath. Again, with the use of Aylen Stradford  regular  glidewire, Larue Drawdy Kumpe catheter was advanced beyond the obstructing caval lesion. Contrast injection confirmed appropriate positioning. Next, after approximately 20 minute tPA dwell, mechanical thrombectomy was performed with the Angiojet device both from the left lower extremity access as well as the right lower extremity access. Limited vena cavagram was performed via the right lower extremity access. Kumpe catheter was utilized for measurement purposes and ultimately Karisma Meiser 90 cm/20-cm infusion catheter was placed from the right lower extremity access and Jakarius Flamenco 90 cm/30 cm infusion catheter was placed from the left lower extremity access adequately covering the occluded segment of the bilateral venous systems as well as the caudal aspect of the IVC. Postprocedural spot fluoroscopic images were obtained. Both popliteal vascular sheath were secured in place. Dressings were applied. Patient's was subjectively cold by the end of the procedure with associated elevation in his blood pressure and as such he was administered 10 mg of hydralazine and bear hugger warming device was applied. The patient otherwise tolerated the procedure well without immediate postprocedural complication. FINDINGS: Sonographic evaluation demonstrates patency of the bilateral popliteal veins. Contrast injection demonstrates patency of the bilateral popliteal and superficial femoral veins. There is occlusive thrombus extending from the left common femoral vein and the right common iliac vein through the mid/caudal aspect of the IVC secondary to Kamerin Axford moderate to long segment length presumably extrinsic malignant occlusion secondary to known adjacent bulky retroperitoneal lymphadenopathy. Following pharmacological and mechanical thrombectomy as detailed above, there is restored flow as demonstrated on limited right central pelvic venogram with large amount of residual thrombus. Following infusion catheter placement, both side ports traverse both  occluded segments of the IVC and pelvic venous systems. IMPRESSION: 1. 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 2. Successful initiation of bilateral mechanical and pharmacologic thrombectomy with bilateral infusion catheters appropriately positioned. PLAN: The patient will be transferred to ICU for overnight lytic infusion and will return tomorrow for repeat venogram and potential intervention. Electronically Signed   By: Sandi Mariscal M.D.   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 Patient was initiated bilateral pelvic/IVC catheter directed thrombolysis day prior (04/15/2019), and returns today following 24 hours of tPA infusion. EXAM: 1. IR THROMB F/U EVAL ART/VEN FINAL DAY 2. FLUOROSCOPIC GUIDED ANGIOPLASTY AND STENT PLACEMENT OF THE IVC COMPARISON:  Initiation of bilateral catheter directed pelvic venous and IVC thrombolysis - 04/15/2019; CT abdomen pelvis - 04/11/2019 MEDICATIONS: Heparin 7000 units CONTRAST:  85 cc Omnipaque 300 ANESTHESIA/SEDATION: Moderate (conscious) sedation was employed during this procedure. Amarie Tarte total of Versed 2 mg and Dilaudid 3 mg was administered intravenously. Moderate Sedation Time: 128 minutes. The patient's level of consciousness and vital signs were monitored continuously by radiology nursing throughout the procedure under my direct supervision. FLUOROSCOPY TIME:  14 minutes, 12 seconds (026.3 mGy) COMPLICATIONS: None immediate. TECHNIQUE: Informed written consent was obtained from the patient after Juquan Reznick discussion of the risks, benefits and alternatives to treatment. Questions regarding the procedure were  encouraged and answered. Ryder Chesmore timeout was performed prior to the initiation of the procedure. The patient was placed prone on the fluoroscopy table and the skin posterior to the bilateral knees as well as the external portion the existing bilateral vascular sheaths  and infusion catheters was prepped and draped in usual sterile fashion. The surrounding subcutaneous tissues about both popliteal approach vascular sheath was anesthetized with 1% lidocaine with epinephrine. Next, post lysis venograms were performed from the bilateral infusion catheters following removal of the bilateral infusion wires. Images were reviewed and the decision was made to proceed with additional mechanical thrombectomy. With the use of Valon Glasscock vertebral catheter exchange length stiff Glidewires were advanced from both existing popliteal vascular sheaths superior to the nearly obstructing caval lesion. Contrast injection confirmed appropriate positioning. Stiff glide wires were advanced into the left subclavian vein. 4000 units of heparin was administered intravenously. Next, beginning with the left lower extremity, the existing 8 French vascular sheath was exchanged over the glidewire for Majel Giel 13 [place] vascular sheath. The 16 mm diameter Clottreiever Inari mechanical thrombectomy device was then utilized to perform 3 passes from the left popliteal approach from at and just below level of the obstructing caval lesion. Post thrombectomy images were obtained from the left lower extremity. Next, the existing right lower extremity 6 French popliteal vein vascular sheath was exchanged over Eda Magnussen guidewire for [place] Springer 13 French vascular sheath. Again, from the right lower extremity approach, the 16 mm diameter Clottreiever Inari mechanical thrombectomy device was then utilized to perform 3 passes from the level at and just below the obstructing caval lesion. Post thrombectomy images were obtained from the right lower extremity. An ACT level was obtained and an  additional 3000 units of heparin was administered intravenously. The long segment eccentric irregular narrowing/subtotal occlusion of the IVC was subsequently balloon angioplastied at multiple stations with Dynisha Due 14 mm x 4 cm Atlas balloon. Despite adequate balloon apposition, post angioplasty venogram images demonstrate Beryl Hornberger recurrent hemodynamically significant narrowing. As such, Ananiah [name] 20 mm x 60 mm Venovo venous stent was deployed across the superior and mid aspect of the caval lesion and was subsequently balloon angioplastied at multiple stations initially with Lydian Chavous 16 mm x 4 cm atlas balloon and ultimately with an 18 mm x 4 cm balloon. Post stent deployment venogram images were obtained however there is felt to be incomplete coverage of the distal aspect of the malignant narrowing. As such, an additional 20 mm x 40 mm Venovo venous stent was deployed overlapping the mid/caudal aspect of venous stent and adequately covering the caudal aspect caval lesion. Both overlapping venous stents were subsequently balloon angioplastied at multiple stations with the 18 mm atlas balloon and completion venogram images were obtained from both lower extremities. Images were reviewed and the procedure was terminated. All wires, catheters and sheaths were removed from the bilateral popliteal vein accesses and hemostasis was achieved with manual compression. Dressings applied. The patient tolerated the procedure well without immediate postprocedural complication. FINDINGS: Initial post lysis venogram performed from the left lower extremity infusion catheter demonstrates restoration of flow through the left pelvis with moderate amount residual thrombus primarily within the peripheral aspect of the left external iliac vein as well as the left common and central aspect of the left superficial femoral veins. There is passage of contrast beyond the nearly obstructing lesion involving mid/caudal aspect of the IVC. Initial post lysis venogram  performed from the right lower extremity demonstrates persistent occlusion at the level of the right common iliac vein. Following 3 passes from each lower extremity with the 16 mm diameter Clottreiever Inari mechanical thrombectomy device, nearly all residual clot burden within the pelvis was removed. Despite balloon angioplasty at multiple stations to 14 mm diameter with excellent balloon apposition, post  angioplasty inferior vena cavogram demonstrates Nichlas Pitera recurrent hemodynamically significant malignant narrowing/subtotal occlusion involving the mid/distal aspect of IVC (series 22). This lesion was again noted to be significantly caudal to the confluence of the bilateral renal veins. As such, nearly obstructing caval lesion was treated with overlapping stent deployment of 20 mm diameter Venovo venous stents which were subsequently balloon angioplastied to 18 mm diameter. Completion images demonstrate Jesseca Marsch technically excellent result with restored brisk flow through the bilateral pelvic systems and the IVC. There is Kari Montero mild residual narrowing at the level of the mid/caudal aspect of the IVC, though not felt to result in Kathe Wirick residual hemodynamically significant stenosis. No evidence of complication. Specifically, no evidence of contrast extravasation or vessel dissection. 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 PLAN: - Maintain IV heparin drip until patient is successfully converted to long-term anticoagulant. - Recommend maintaining SCD devices while in bed. Electronically Signed   By: Sandi Mariscal M.D.   On: 04/16/2019 16:26    Microbiology: Recent Results (from the past 240 hour(s))  Culture, blood (routine x 2)     Status: None   Collection Time: 04/22/19 11:58 AM   Specimen: BLOOD LEFT WRIST  Result Value Ref Range Status   Specimen  Description BLOOD LEFT WRIST  Final   Special Requests AEROBIC BOTTLE ONLY Blood Culture adequate volume  Final   Culture   Final    NO GROWTH 5 DAYS Performed at [facility], [address], [place], [place] [address]    Report Status 04/27/2019 FINAL  Final  Culture, blood (routine x 2)     Status: None   Collection Time: 04/22/19 11:59 AM   Specimen: BLOOD LEFT HAND  Result Value Ref Range Status   Specimen Description BLOOD LEFT HAND  Final   Special Requests AEROBIC BOTTLE ONLY Blood Culture adequate volume  Final   Culture   Final    NO GROWTH 5 DAYS Performed at [facility], [place] [address], [place], [place] [address]    Report Status 04/27/2019 FINAL  Final     Labs: Basic Metabolic Panel: Recent Labs  Lab 04/25/19 0552 04/26/19 0537 04/27/19 0629 04/28/19 0546 04/29/19 0400  NA 138 138 139 137 138  K 3.5 3.4* 3.2* 3.3* 3.7  CL 108 108 107 108 106  CO2 21* '23 24 24 25  ' GLUCOSE 153* 135* 115* 101* 107*  BUN 19 24* 24* 22* 19  CREATININE 0.83 0.81 0.85 0.71 0.68  CALCIUM 7.4* 7.3* 7.4* 7.0* 7.0*  MG 2.7* 2.6* 2.4 2.0 2.0  PHOS  --   --   --   --  2.0*   Liver Function Tests: Recent Labs  Lab 04/25/19 0552 04/26/19 0537 04/27/19 0629 04/28/19 0546 04/29/19 0400  AST '24 22 22 20 24  ' ALT '24 22 22 20 25  ' ALKPHOS 83 70 69 53 61  BILITOT 0.9 1.0 0.6 0.6 0.8  PROT 6.3* 6.2* 6.6 5.6* 5.7*  ALBUMIN 2.7* 2.5* 2.8* 2.5* 2.7*   No results for input(s): LIPASE, AMYLASE in the last 168 hours. No results for input(s): AMMONIA in the last 168 hours. CBC: Recent Labs  Lab 04/25/19 0552 04/26/19 0537 04/27/19 0629 04/28/19 0546 04/29/19 0400  WBC 11.2* 14.5* 13.3* 8.8 8.5  NEUTROABS 10.1* 13.3* 12.7* 8.2* 7.8*  HGB 7.6* 7.3* 8.1* 6.9* 7.8*  HCT 24.4* 23.1* 25.3* 21.7* 24.1*  MCV  92.8 92.4 92.3 93.1 92.0  PLT 354 409* 534* 360 393   Cardiac Enzymes: No results for input(s): CKTOTAL, CKMB, CKMBINDEX, TROPONINI in the last 168 hours. BNP: BNP  (last 3 results) No results for input(s): BNP in the last 8760 hours.  ProBNP (last 3 results) No results for input(s): PROBNP in the last 8760 hours.  CBG: No results for input(s): GLUCAP in the last 168 hours.     Signed:  Fayrene Helper MD.  [facility] 04/29/2019, 10:33 AM

## 2019-04-29 NOTE — Discharge Instructions (Signed)
You should follow up with oncology at [facility].  You will see Dr. Coralee North on 05/11/2019 at 9 AM (arrive 30-45 minutes early to register).  Appointment will be in the [facility] building and the address is [address] [place] [address].  Follow up with Dr. Lavon Paganini from Radiology when you get home.  Call [phone] for scheduling.  The [place] office number is [phone].  Please request your records from this hospital stay and bring these to your follow up appointments.

## 2019-04-29 NOTE — TOC Progression Note (Signed)
Transition of Care [facility]) - Progression Note    Patient Details  Name: Timothy [name] MRN: [id number] Date of Birth: 1980-04-16  Transition of Care [facility]) CM/SW Contact  Joaquin Courts, RN Phone Number: 04/29/2019, 11:05 AM  Clinical Narrative:    [employer] to deliver rolling walker and cane to bedside.     Expected Discharge Plan: Home/Self Care Barriers to Discharge: No Barriers Identified  Expected Discharge Plan and Services Expected Discharge Plan: Home/Self Care   Discharge Planning Services: CM Consult Post Acute Care Choice: Durable Medical Equipment Living arrangements for the past 2 months: Single Family Home Expected Discharge Date: 04/29/19               DME Arranged: Gilford Rile rolling, Kasandra Knudsen DME Agency: [employer] Date DME Agency Contacted: 04/29/19 Time DME Agency Contacted: [phone] Representative spoke with at DME Agency: Bertrum Sol HH Arranged: NA [place]: NA         Social Determinants of Health ([place]) Interventions    Readmission Risk Interventions No flowsheet data found.

## 2019-04-30 LAB — BPAM RBC
Blood Product Expiration Date: 202103122359
ISSUE DATE / TIME: 202102121819
Unit Type and Rh: 5100

## 2019-04-30 LAB — TYPE AND SCREEN
ABO/RH(D): O POS
Antibody Screen: NEGATIVE
Unit division: 0

## 2019-05-08 ENCOUNTER — Telehealth: Payer: Self-pay

## 2019-05-08 NOTE — Telephone Encounter (Signed)
Faxed all progress notes from Dr. Burr Medico to Verdis Frederickson at [facility] [phone], they had previously received the pathology and radiology reports.

## 2021-10-06 ENCOUNTER — Other Ambulatory Visit: Payer: Self-pay

## 2021-11-20 IMAGING — US US RENAL
1 series · 14 of 25 positions shown · non-contrast
Comparison: April 17, 2019.

CLINICAL DATA: Elevated serum creatinine level.

EXAM:
RENAL / URINARY TRACT ULTRASOUND COMPLETE

[Series 1: us renal · 14 of 36 slices shown]
[im 1/36]
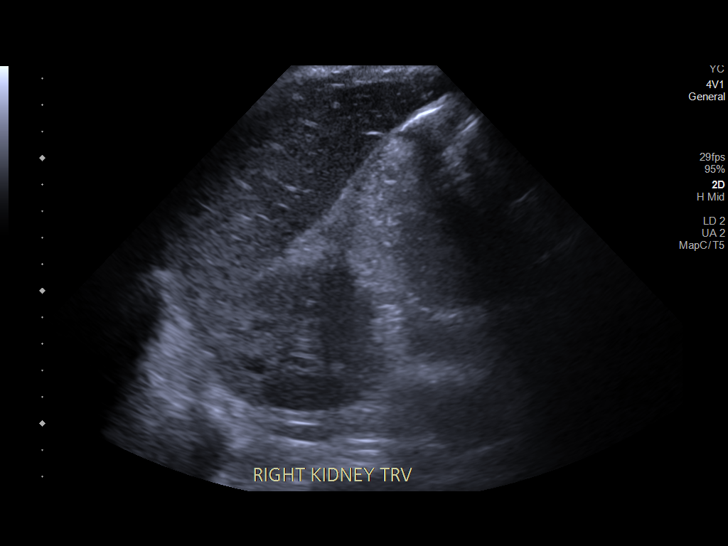
[im 3/36]
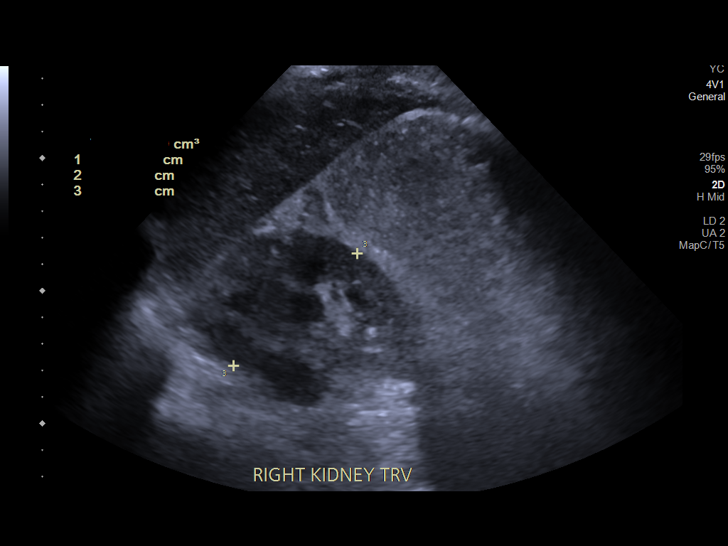
[im 6/36]
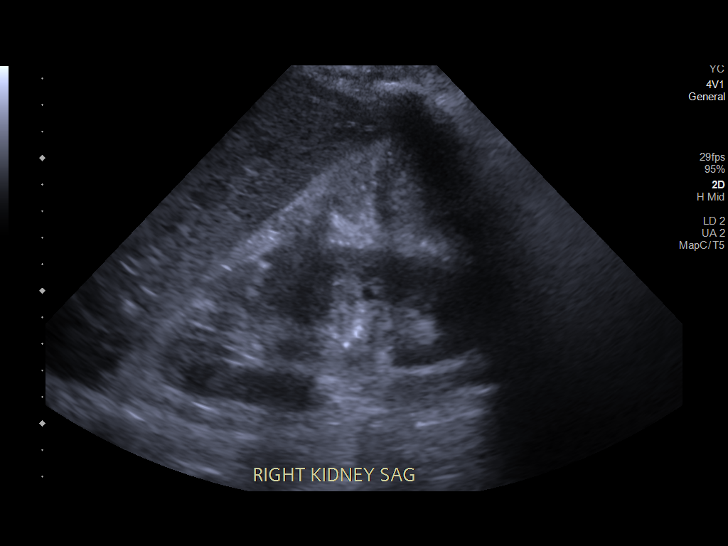
[im 9/36]
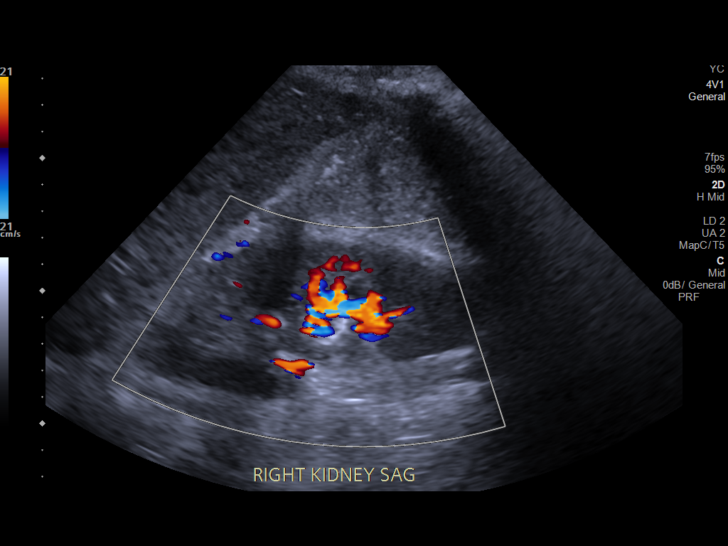
[im 12/36]
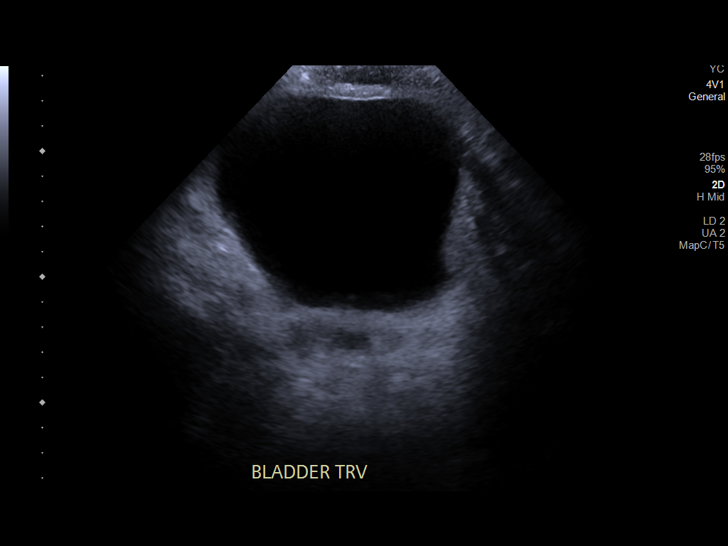
[im 14/36]
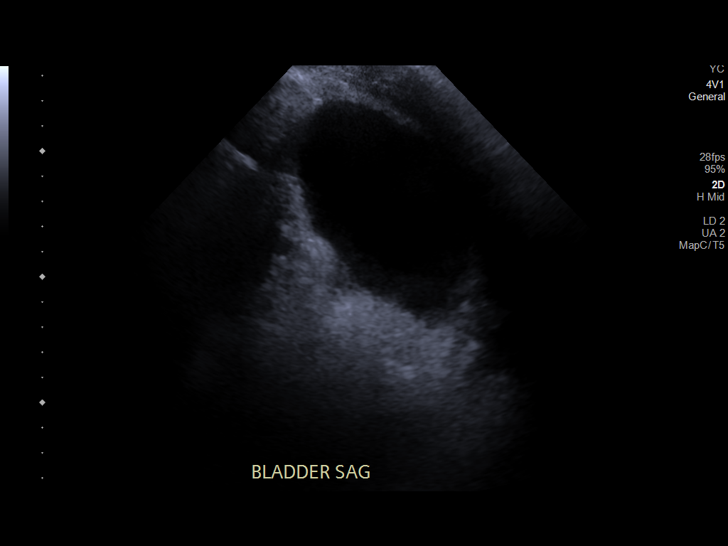
[im 17/36]
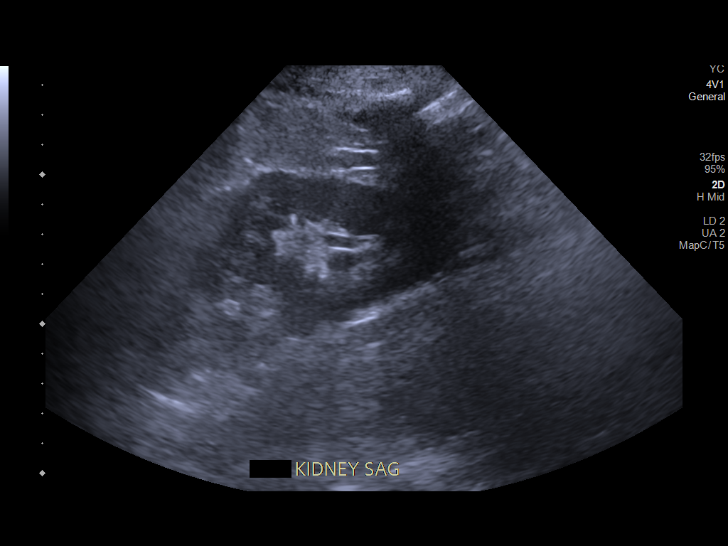
[im 19/36]
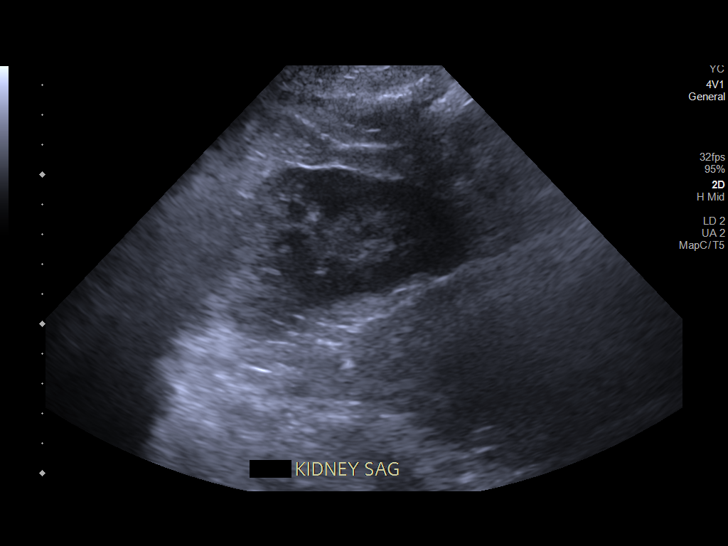
[im 22/36]
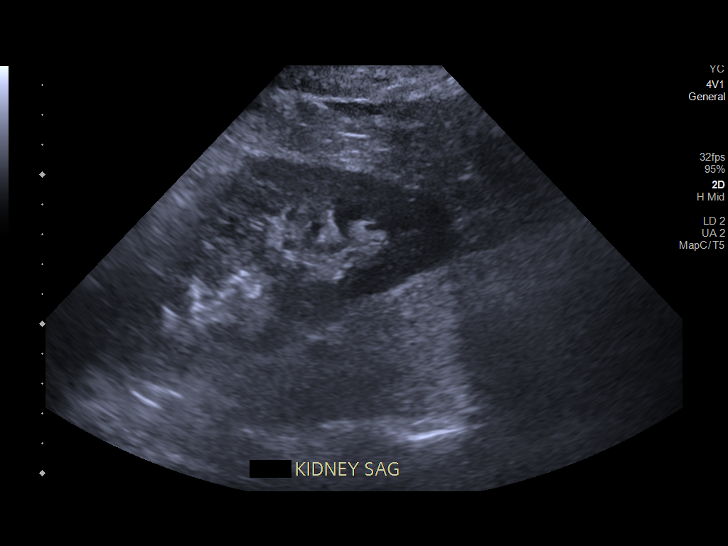
[im 24/36]
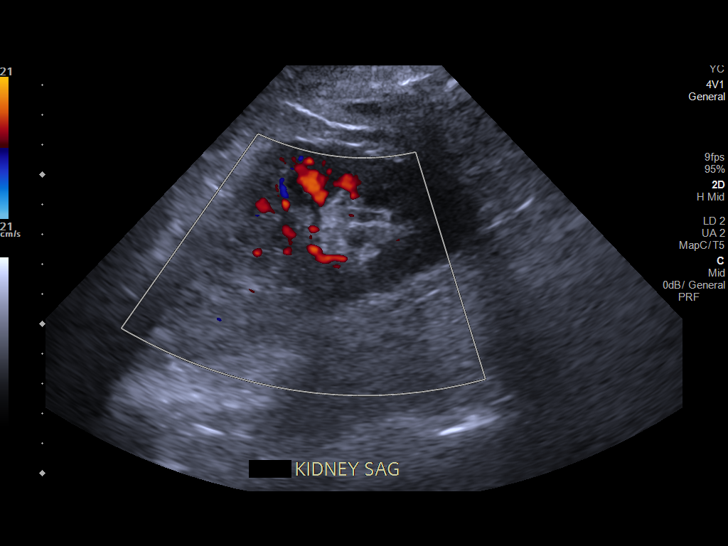
[im 27/36]
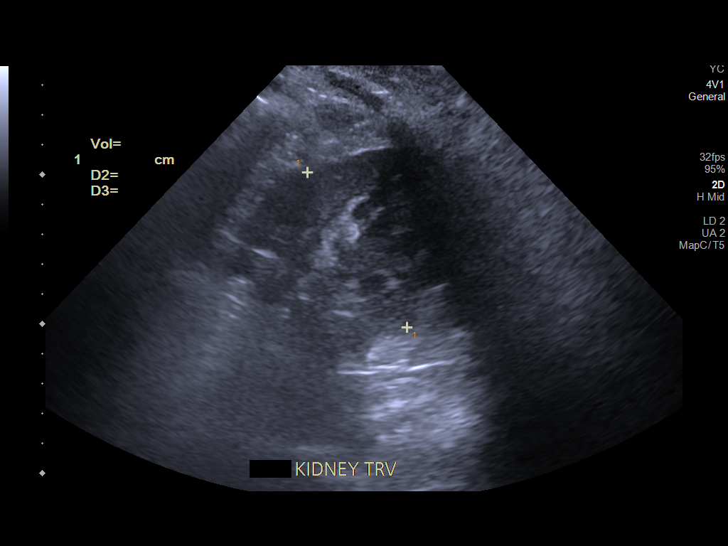
[im 30/36]
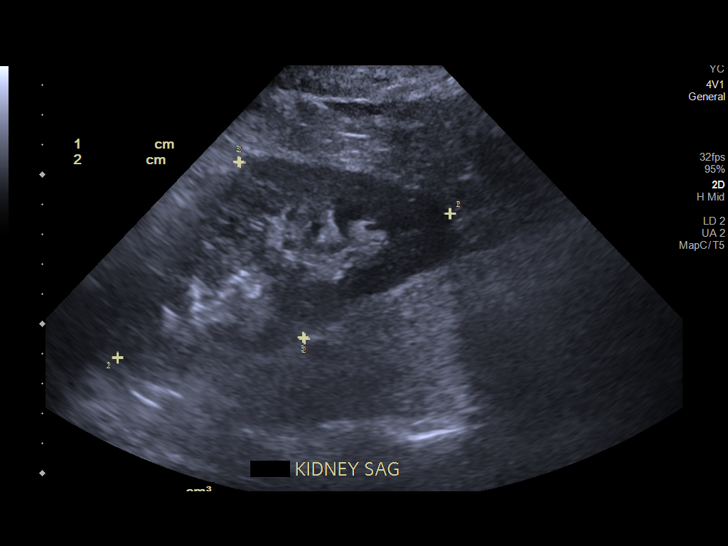
[im 33/36]
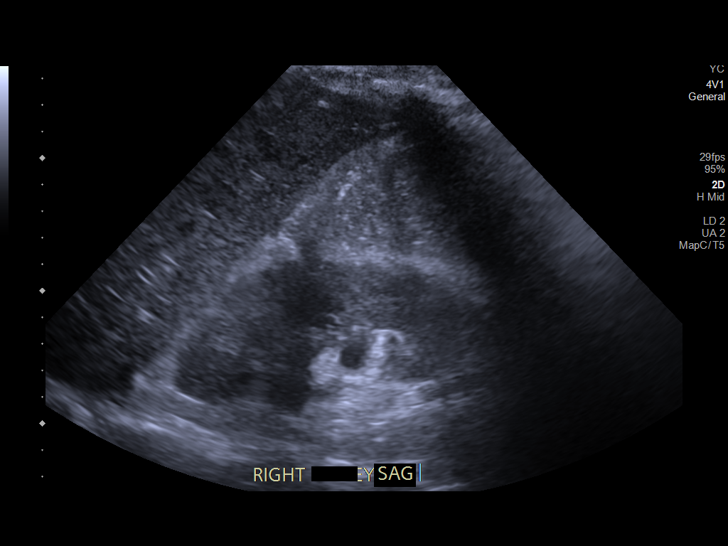
[im 36/36]
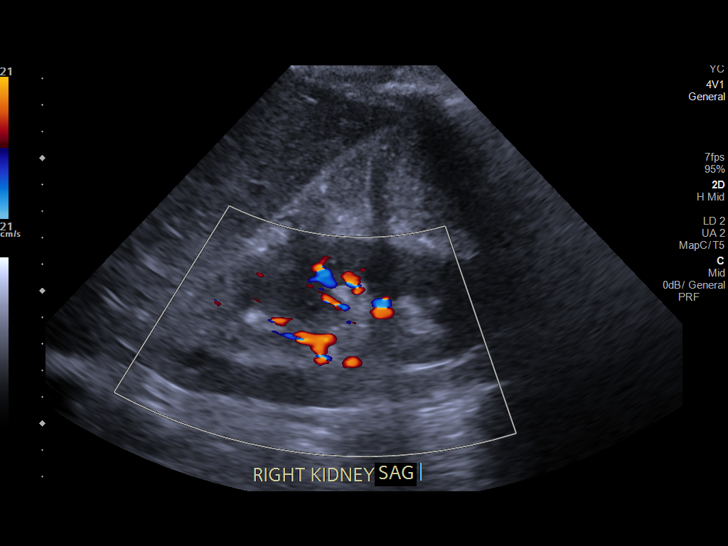

[14 of 25 positions shown; findings below may reference images not displayed]

FINDINGS: Right Kidney:

Renal measurements: 13.9 x 6.4 x 6.3 cm = volume: 294 mL .
Echogenicity within normal limits. No mass or hydronephrosis
visualized.

Left Kidney:

Renal measurements: 12.2 x 6.3 x 6.2 cm = volume: 246 mL.
Echogenicity within normal limits. No mass or hydronephrosis
visualized.

Bladder:

Appears normal for degree of bladder distention. Foley catheter is
noted.

Other:

None.
IMPRESSION: No significant renal abnormality is noted.

## 2021-11-20 IMAGING — CT CT ABD-PELV W/O CM
2 of 4 series · 15 of 46 positions shown, 17 images · non-contrast
Comparison: Abdominal radiographs, 04/21/2019, CT abdomen pelvis,
04/17/2018, 04/11/2019

CLINICAL DATA: Fever, abdominal distension, testicular cancer,
status post right orchiectomy

EXAM:
CT ABDOMEN AND PELVIS WITHOUT CONTRAST
TECHNIQUE: Multidetector CT imaging of the abdomen and pelvis was performed
following the standard protocol without IV contrast.

[Series 3: ap without · axial · non-contrast · 0.79mm/px · z∈[-556,-46]mm · 12 of 114 slices shown, 14 images]
[im 6/114  soft-tissue]
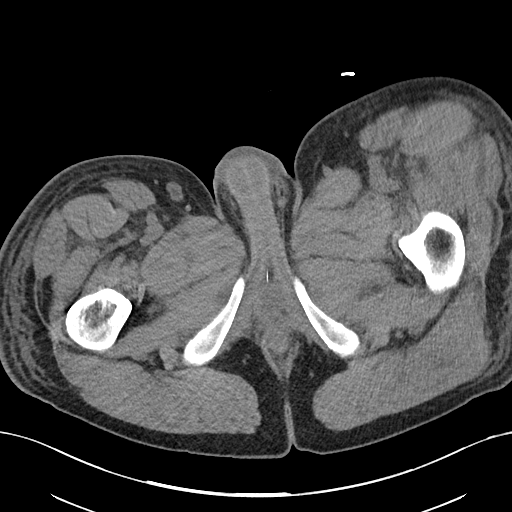
[im 6/114  bone]
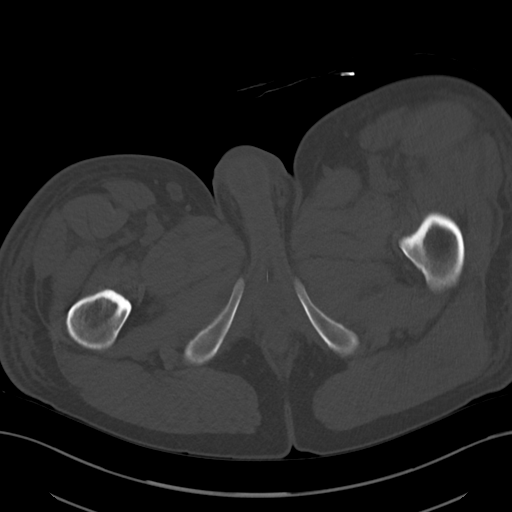
[im 17/114  soft-tissue]
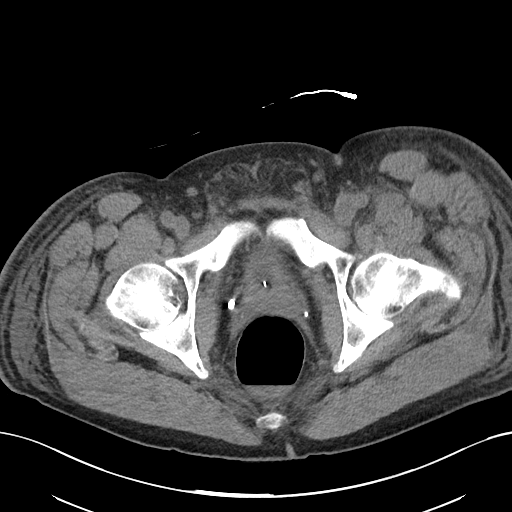
[im 23/114  soft-tissue]
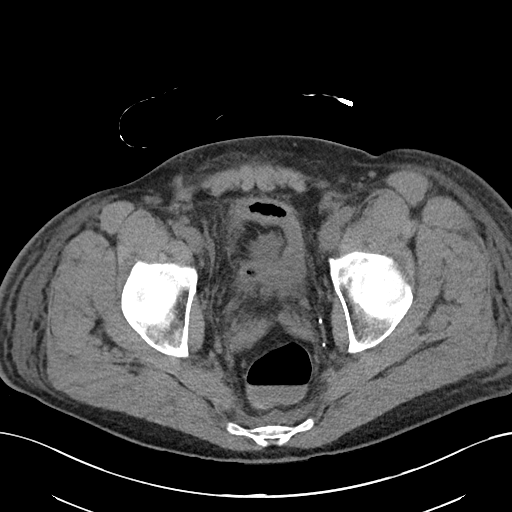
[im 34/114  soft-tissue]
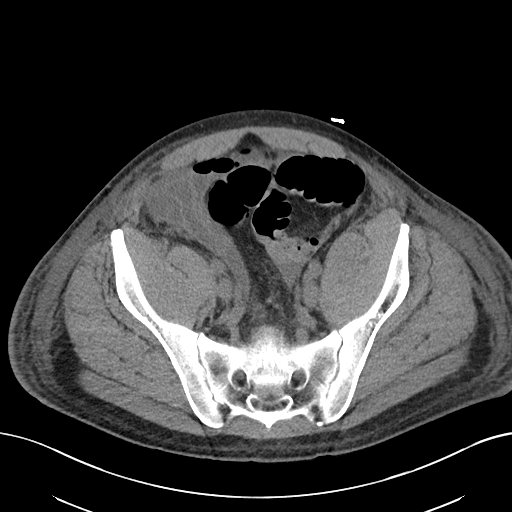
[im 46/114  soft-tissue]
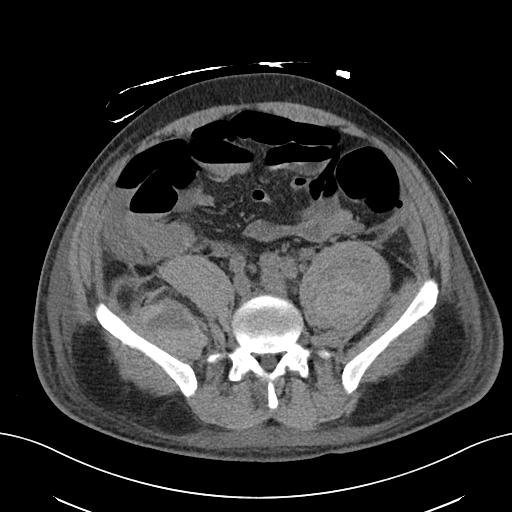
[im 51/114  soft-tissue]
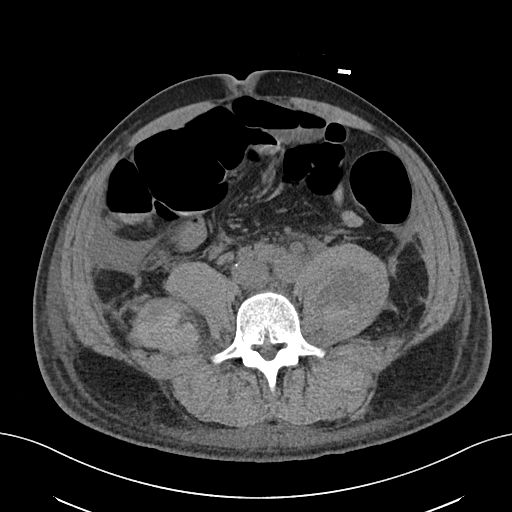
[im 63/114  soft-tissue]
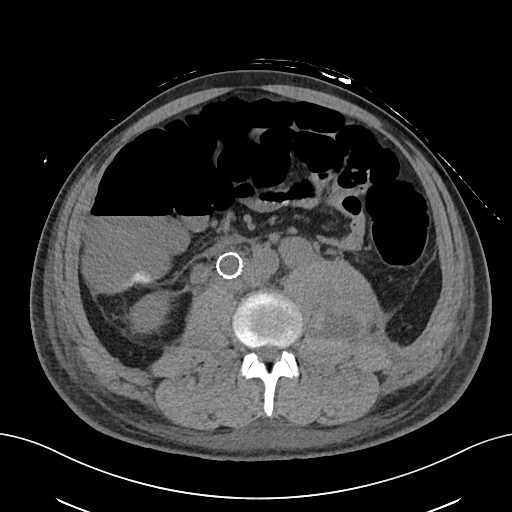
[im 68/114  soft-tissue]
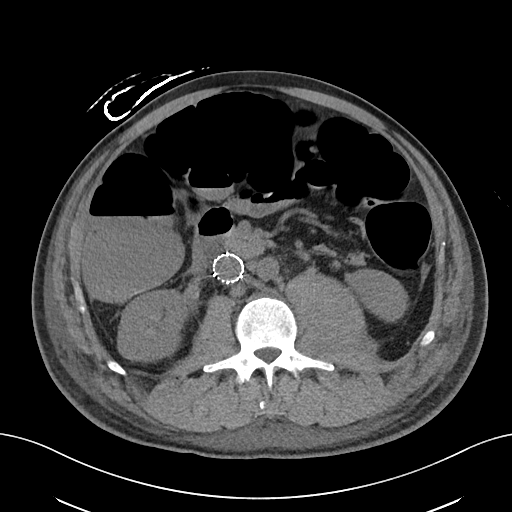
[im 80/114  soft-tissue]
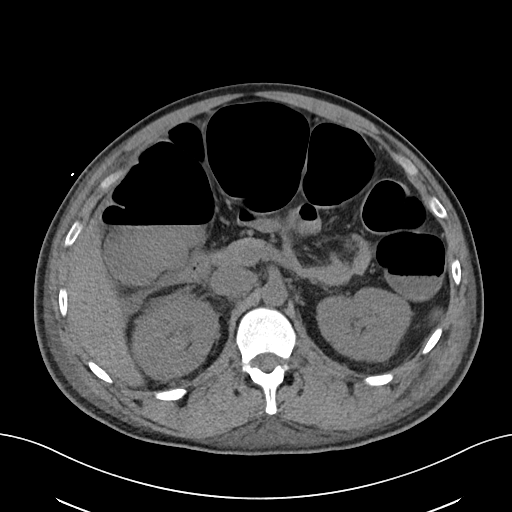
[im 80/114  bone]
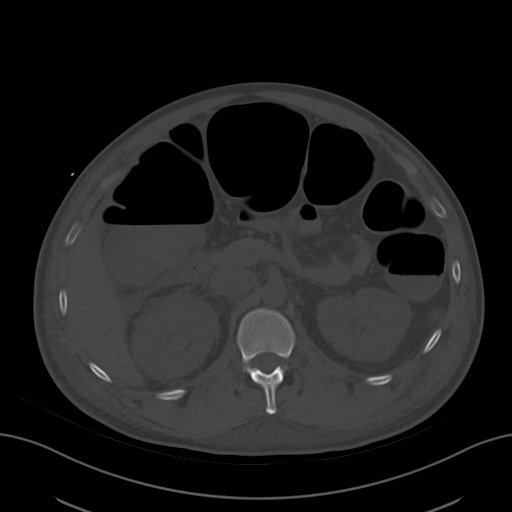
[im 91/114  soft-tissue]
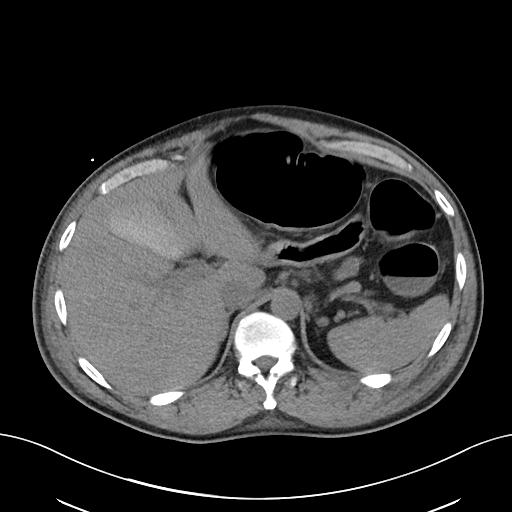
[im 97/114  soft-tissue]
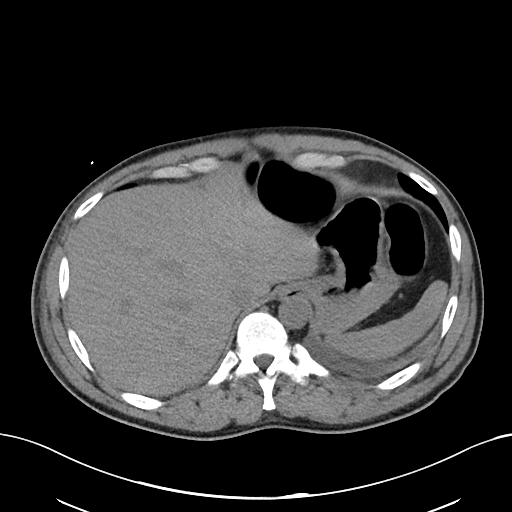
[im 108/114  soft-tissue]
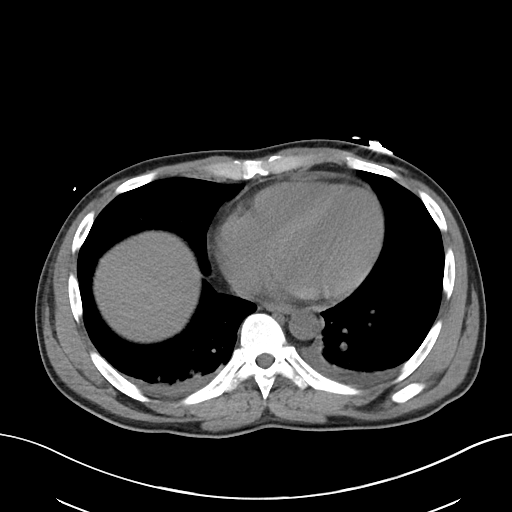

[Series 6: cor · coronal · 0.88mm/px · 3 of 107 slices shown]
[im 36/107  soft-tissue]
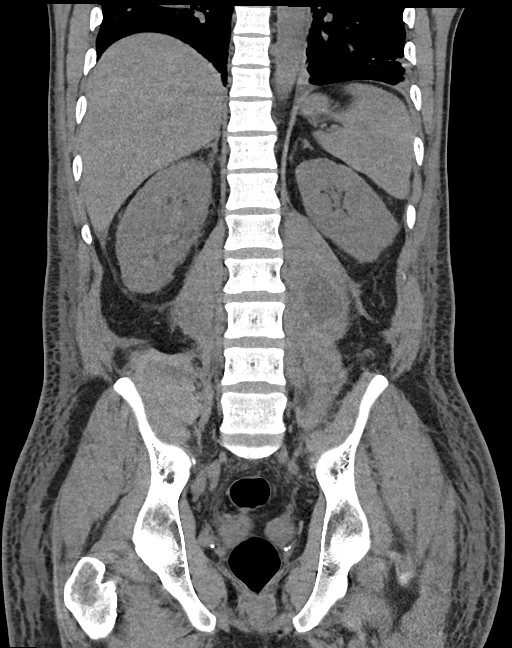
[im 48/107  soft-tissue]
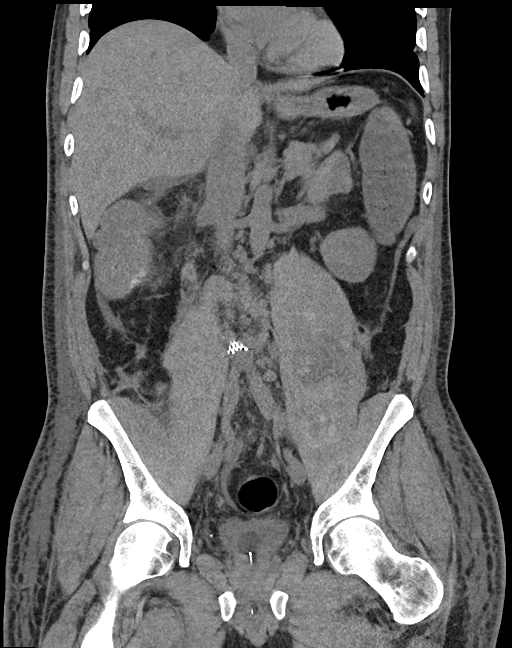
[im 59/107  soft-tissue]
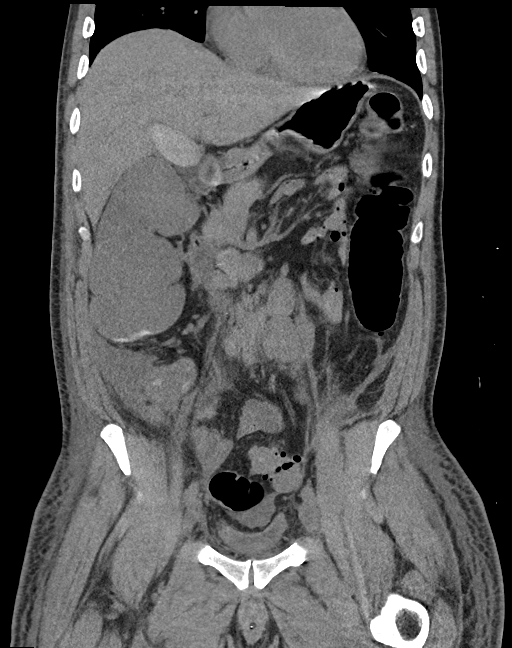

[15 of 46 positions shown; findings below may reference images not displayed]

FINDINGS: Lower chest: Unchanged small bilateral pleural effusions and
associated atelectasis or consolidation.

Hepatobiliary: No solid liver abnormality is seen. No gallstones,
gallbladder wall thickening, or biliary dilatation.

Pancreas: Unremarkable. No pancreatic ductal dilatation or
surrounding inflammatory changes.

Spleen: Normal in size without significant abnormality.

Adrenals/Urinary Tract: Adrenal glands are unremarkable. Mild right
hydronephrosis, slightly increased compared to prior examination.
Foley catheter in the urinary bladder.

Stomach/Bowel: Stomach is within normal limits. The colon remains
diffusely gas and fluid-filled to the rectum, caliber of the colon
slightly increased, measuring up to 9 cm in the transverse colon.
Sigmoid diverticulosis.

Vascular/Lymphatic: Stent of the infrarenal IVC. Unchanged bulky
retroperitoneal lymphadenopathy, largest left retroperitoneal nodes
measuring up to 3.5 x 2.8 cm (series 3, image 60).

Reproductive: No mass or other significant abnormality.

Other: Anasarca. Retroperitoneal hematomas in the left psoas (series
3, image 57) and in the retroperitoneal fat posterior to the right
psoas (series 3, image 66), not significantly changed.

Musculoskeletal: No acute or significant osseous findings.
IMPRESSION: 1. The colon remains diffusely gas and fluid-filled to the rectum,
slightly increased in caliber compared to prior examination.
Findings favor ileus. No specific findings to suggest infectious or
inflammatory colitis.
2. Retroperitoneal hematomas in the left psoas and retroperitoneal
fat posterior to the right psoas are not significantly changed.
3. Unchanged bulky retroperitoneal lymphadenopathy.
4. Mild right hydronephrosis, slightly increased compared to prior
examination, the mid to distal ureter presumably obstructed by
adjacent lymph nodes.
5. Pleural effusions and anasarca.
6. Status post right orchiectomy.

## 2021-11-22 IMAGING — DX DG CHEST 1V PORT
1 series · 1 of 1 positions shown · non-contrast
Comparison: Chest radiograph dated 04/18/2019.

CLINICAL DATA: 38-year-old male status post PICC placement.

EXAM:
PORTABLE CHEST 1 VIEW

[chest ap]
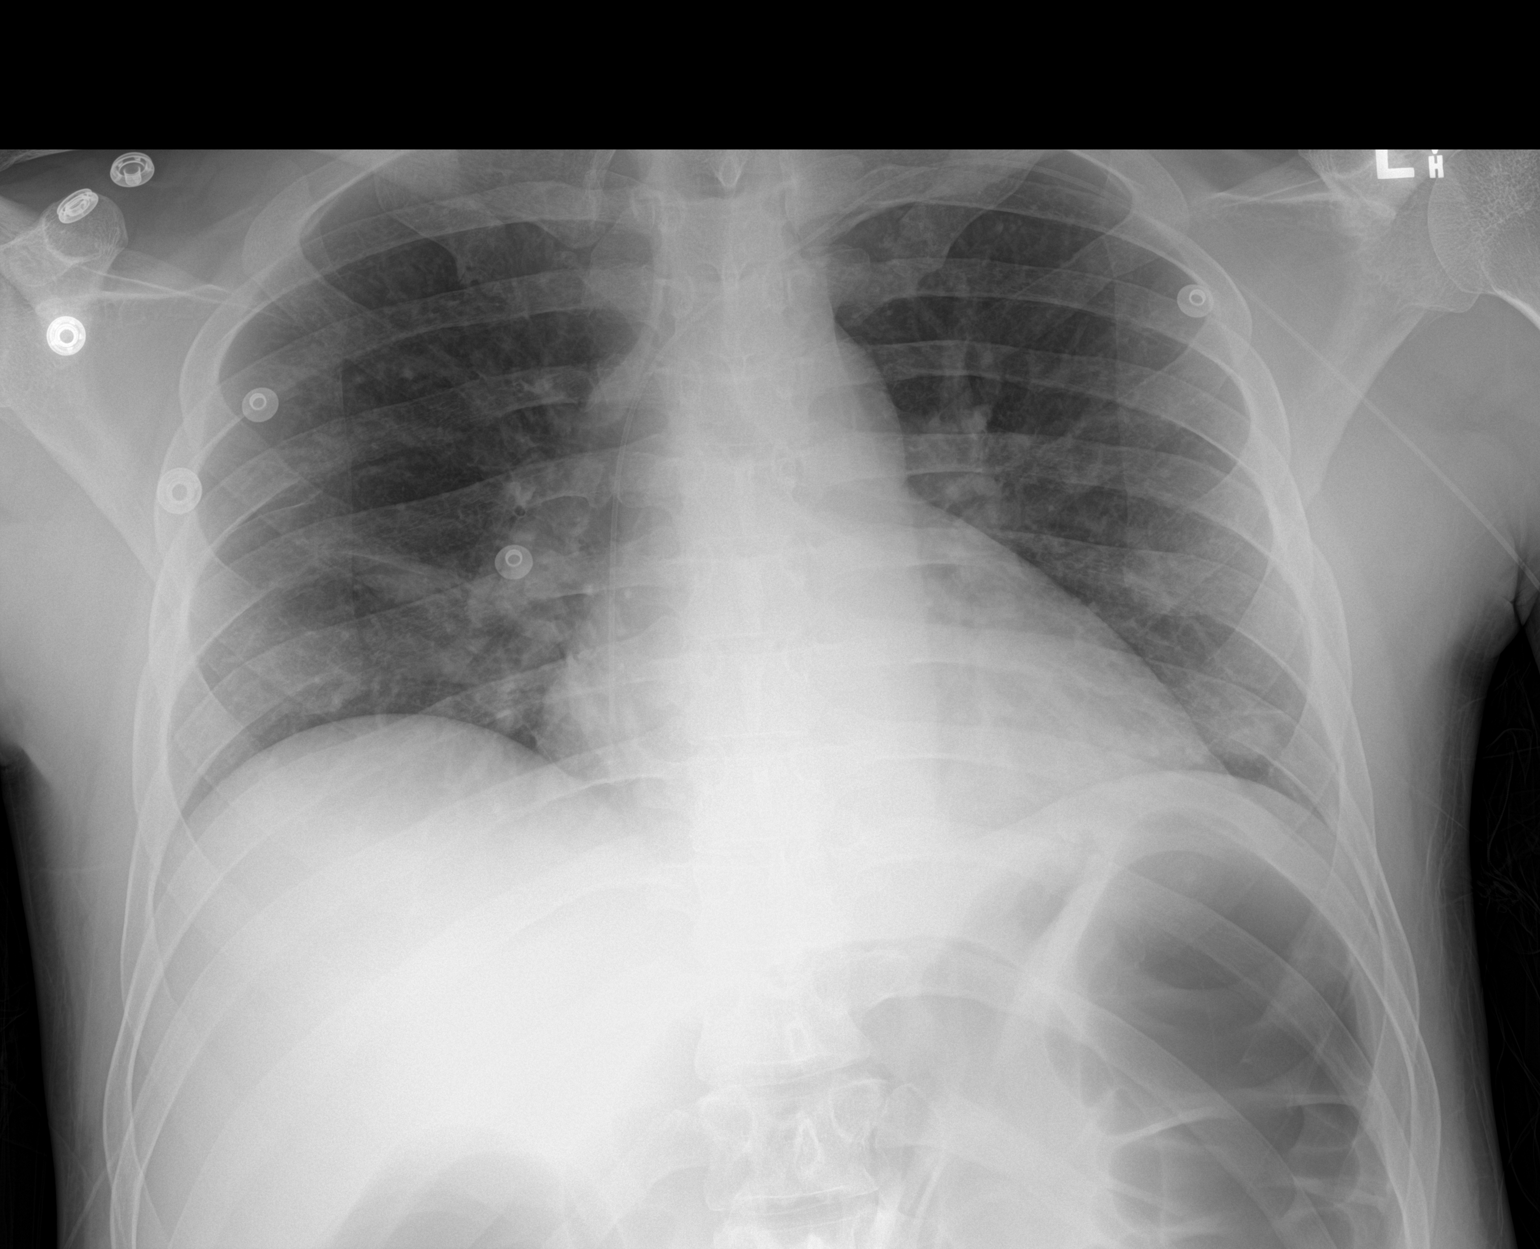

[1 of 1 positions shown; findings below may reference images not displayed]

FINDINGS: Left-sided PICC with tip over the right atrium close to the
cavoatrial junction. Faint bilateral lower lung field densities may
represent atelectasis or atypical infection. Clinical correlation
recommended. There is no pleural effusion or pneumothorax. The
cardiac silhouette is within normal limits. No acute osseous
pathology.
IMPRESSION: 1. Left-sided PICC with tip over the right atrium.
2. Faint bilateral lower lung field atelectasis or developing
infiltrate.
